# Patient Record
Sex: Female | Born: 1940
Health system: Southern US, Community
[De-identification: ages and names within clinical notes are randomized; demographics above are authoritative.]

## PROBLEM LIST (undated history)

## (undated) DIAGNOSIS — E119 Type 2 diabetes mellitus without complications: Secondary | ICD-10-CM

## (undated) DIAGNOSIS — E079 Disorder of thyroid, unspecified: Secondary | ICD-10-CM

## (undated) DIAGNOSIS — C4491 Basal cell carcinoma of skin, unspecified: Secondary | ICD-10-CM

## (undated) DIAGNOSIS — I4891 Unspecified atrial fibrillation: Secondary | ICD-10-CM

## (undated) DIAGNOSIS — R413 Other amnesia: Secondary | ICD-10-CM

## (undated) DIAGNOSIS — H409 Unspecified glaucoma: Secondary | ICD-10-CM

## (undated) DIAGNOSIS — I10 Essential (primary) hypertension: Secondary | ICD-10-CM

## (undated) DIAGNOSIS — Z8673 Personal history of transient ischemic attack (TIA), and cerebral infarction without residual deficits: Secondary | ICD-10-CM

## (undated) DIAGNOSIS — K52839 Microscopic colitis, unspecified: Secondary | ICD-10-CM

## (undated) DIAGNOSIS — H4040X0 Glaucoma secondary to eye inflammation, unspecified eye, stage unspecified: Secondary | ICD-10-CM

## (undated) DIAGNOSIS — E039 Hypothyroidism, unspecified: Secondary | ICD-10-CM

## (undated) DIAGNOSIS — H209 Unspecified iridocyclitis: Secondary | ICD-10-CM

## (undated) HISTORY — DX: Other amnesia: R41.3

## (undated) HISTORY — PX: BREAST EXCISIONAL BIOPSY: SUR124

## (undated) HISTORY — DX: Essential (primary) hypertension: I10

## (undated) HISTORY — DX: Glaucoma secondary to eye inflammation, unspecified eye, stage unspecified: H40.40X0

## (undated) HISTORY — DX: Unspecified atrial fibrillation: I48.91

## (undated) HISTORY — DX: Type 2 diabetes mellitus without complications: E11.9

## (undated) HISTORY — DX: Basal cell carcinoma of skin, unspecified: C44.91

## (undated) HISTORY — DX: Microscopic colitis, unspecified: K52.839

## (undated) HISTORY — DX: Personal history of transient ischemic attack (TIA), and cerebral infarction without residual deficits: Z86.73

## (undated) HISTORY — DX: Disorder of thyroid, unspecified: E07.9

## (undated) HISTORY — DX: Unspecified iridocyclitis: H20.9

## (undated) HISTORY — DX: Hypothyroidism, unspecified: E03.9

---

## 2011-02-23 DIAGNOSIS — H2510 Age-related nuclear cataract, unspecified eye: Secondary | ICD-10-CM | POA: Diagnosis not present

## 2011-02-24 DIAGNOSIS — H409 Unspecified glaucoma: Secondary | ICD-10-CM | POA: Diagnosis not present

## 2011-02-24 DIAGNOSIS — H4040X0 Glaucoma secondary to eye inflammation, unspecified eye, stage unspecified: Secondary | ICD-10-CM | POA: Diagnosis not present

## 2011-03-06 DIAGNOSIS — K5732 Diverticulitis of large intestine without perforation or abscess without bleeding: Secondary | ICD-10-CM | POA: Diagnosis not present

## 2011-03-06 DIAGNOSIS — E7800 Pure hypercholesterolemia, unspecified: Secondary | ICD-10-CM | POA: Diagnosis not present

## 2011-03-06 DIAGNOSIS — IMO0001 Reserved for inherently not codable concepts without codable children: Secondary | ICD-10-CM | POA: Diagnosis not present

## 2011-03-06 DIAGNOSIS — M949 Disorder of cartilage, unspecified: Secondary | ICD-10-CM | POA: Diagnosis not present

## 2011-03-06 DIAGNOSIS — M899 Disorder of bone, unspecified: Secondary | ICD-10-CM | POA: Diagnosis not present

## 2011-03-15 DIAGNOSIS — I10 Essential (primary) hypertension: Secondary | ICD-10-CM | POA: Diagnosis not present

## 2011-03-15 DIAGNOSIS — K5730 Diverticulosis of large intestine without perforation or abscess without bleeding: Secondary | ICD-10-CM | POA: Diagnosis not present

## 2011-03-15 DIAGNOSIS — E079 Disorder of thyroid, unspecified: Secondary | ICD-10-CM | POA: Diagnosis not present

## 2011-03-15 DIAGNOSIS — R110 Nausea: Secondary | ICD-10-CM | POA: Diagnosis not present

## 2011-03-15 DIAGNOSIS — R10815 Periumbilic abdominal tenderness: Secondary | ICD-10-CM | POA: Diagnosis not present

## 2011-03-15 DIAGNOSIS — R109 Unspecified abdominal pain: Secondary | ICD-10-CM | POA: Diagnosis not present

## 2011-03-15 DIAGNOSIS — I77811 Abdominal aortic ectasia: Secondary | ICD-10-CM | POA: Diagnosis not present

## 2011-03-15 DIAGNOSIS — R112 Nausea with vomiting, unspecified: Secondary | ICD-10-CM | POA: Diagnosis not present

## 2011-03-15 DIAGNOSIS — I714 Abdominal aortic aneurysm, without rupture: Secondary | ICD-10-CM | POA: Diagnosis not present

## 2011-03-15 DIAGNOSIS — E119 Type 2 diabetes mellitus without complications: Secondary | ICD-10-CM | POA: Diagnosis not present

## 2011-03-15 DIAGNOSIS — Z9889 Other specified postprocedural states: Secondary | ICD-10-CM | POA: Diagnosis not present

## 2011-03-21 DIAGNOSIS — R1310 Dysphagia, unspecified: Secondary | ICD-10-CM | POA: Diagnosis not present

## 2011-03-21 DIAGNOSIS — R0789 Other chest pain: Secondary | ICD-10-CM | POA: Diagnosis not present

## 2011-03-21 DIAGNOSIS — R109 Unspecified abdominal pain: Secondary | ICD-10-CM | POA: Diagnosis not present

## 2011-03-30 DIAGNOSIS — K219 Gastro-esophageal reflux disease without esophagitis: Secondary | ICD-10-CM | POA: Diagnosis not present

## 2011-03-30 DIAGNOSIS — R1310 Dysphagia, unspecified: Secondary | ICD-10-CM | POA: Diagnosis not present

## 2011-04-06 DIAGNOSIS — Z5309 Procedure and treatment not carried out because of other contraindication: Secondary | ICD-10-CM | POA: Diagnosis not present

## 2011-04-06 DIAGNOSIS — H2510 Age-related nuclear cataract, unspecified eye: Secondary | ICD-10-CM | POA: Diagnosis not present

## 2011-04-07 DIAGNOSIS — IMO0001 Reserved for inherently not codable concepts without codable children: Secondary | ICD-10-CM | POA: Diagnosis not present

## 2011-04-07 DIAGNOSIS — I953 Hypotension of hemodialysis: Secondary | ICD-10-CM | POA: Diagnosis not present

## 2011-04-07 DIAGNOSIS — E7800 Pure hypercholesterolemia, unspecified: Secondary | ICD-10-CM | POA: Diagnosis not present

## 2011-04-14 DIAGNOSIS — IMO0001 Reserved for inherently not codable concepts without codable children: Secondary | ICD-10-CM | POA: Diagnosis not present

## 2011-04-14 DIAGNOSIS — E7800 Pure hypercholesterolemia, unspecified: Secondary | ICD-10-CM | POA: Diagnosis not present

## 2011-04-14 DIAGNOSIS — L219 Seborrheic dermatitis, unspecified: Secondary | ICD-10-CM | POA: Diagnosis not present

## 2011-04-20 DIAGNOSIS — I10 Essential (primary) hypertension: Secondary | ICD-10-CM | POA: Diagnosis not present

## 2011-04-20 DIAGNOSIS — H4020X0 Unspecified primary angle-closure glaucoma, stage unspecified: Secondary | ICD-10-CM | POA: Diagnosis not present

## 2011-04-20 DIAGNOSIS — H4040X0 Glaucoma secondary to eye inflammation, unspecified eye, stage unspecified: Secondary | ICD-10-CM | POA: Diagnosis not present

## 2011-04-20 DIAGNOSIS — Z86718 Personal history of other venous thrombosis and embolism: Secondary | ICD-10-CM | POA: Diagnosis not present

## 2011-04-20 DIAGNOSIS — F17200 Nicotine dependence, unspecified, uncomplicated: Secondary | ICD-10-CM | POA: Diagnosis not present

## 2011-04-20 DIAGNOSIS — E7800 Pure hypercholesterolemia, unspecified: Secondary | ICD-10-CM | POA: Diagnosis not present

## 2011-04-20 DIAGNOSIS — E119 Type 2 diabetes mellitus without complications: Secondary | ICD-10-CM | POA: Diagnosis not present

## 2011-04-20 DIAGNOSIS — H409 Unspecified glaucoma: Secondary | ICD-10-CM | POA: Diagnosis not present

## 2011-04-20 DIAGNOSIS — H21549 Posterior synechiae (iris), unspecified eye: Secondary | ICD-10-CM | POA: Diagnosis not present

## 2011-04-20 DIAGNOSIS — H2510 Age-related nuclear cataract, unspecified eye: Secondary | ICD-10-CM | POA: Diagnosis not present

## 2011-04-20 DIAGNOSIS — M19049 Primary osteoarthritis, unspecified hand: Secondary | ICD-10-CM | POA: Diagnosis not present

## 2011-04-28 DIAGNOSIS — I10 Essential (primary) hypertension: Secondary | ICD-10-CM | POA: Diagnosis not present

## 2011-04-28 DIAGNOSIS — E7800 Pure hypercholesterolemia, unspecified: Secondary | ICD-10-CM | POA: Diagnosis not present

## 2011-04-28 DIAGNOSIS — IMO0001 Reserved for inherently not codable concepts without codable children: Secondary | ICD-10-CM | POA: Diagnosis not present

## 2011-05-09 DIAGNOSIS — H01009 Unspecified blepharitis unspecified eye, unspecified eyelid: Secondary | ICD-10-CM | POA: Diagnosis not present

## 2011-05-09 DIAGNOSIS — H00029 Hordeolum internum unspecified eye, unspecified eyelid: Secondary | ICD-10-CM | POA: Diagnosis not present

## 2011-05-09 DIAGNOSIS — H16229 Keratoconjunctivitis sicca, not specified as Sjogren's, unspecified eye: Secondary | ICD-10-CM | POA: Diagnosis not present

## 2011-05-24 DIAGNOSIS — IMO0001 Reserved for inherently not codable concepts without codable children: Secondary | ICD-10-CM | POA: Diagnosis not present

## 2011-05-24 DIAGNOSIS — E7800 Pure hypercholesterolemia, unspecified: Secondary | ICD-10-CM | POA: Diagnosis not present

## 2011-05-24 DIAGNOSIS — I10 Essential (primary) hypertension: Secondary | ICD-10-CM | POA: Diagnosis not present

## 2011-05-31 DIAGNOSIS — IMO0001 Reserved for inherently not codable concepts without codable children: Secondary | ICD-10-CM | POA: Diagnosis not present

## 2011-05-31 DIAGNOSIS — L219 Seborrheic dermatitis, unspecified: Secondary | ICD-10-CM | POA: Diagnosis not present

## 2011-05-31 DIAGNOSIS — H01009 Unspecified blepharitis unspecified eye, unspecified eyelid: Secondary | ICD-10-CM | POA: Diagnosis not present

## 2011-05-31 DIAGNOSIS — H16229 Keratoconjunctivitis sicca, not specified as Sjogren's, unspecified eye: Secondary | ICD-10-CM | POA: Diagnosis not present

## 2011-05-31 DIAGNOSIS — H00029 Hordeolum internum unspecified eye, unspecified eyelid: Secondary | ICD-10-CM | POA: Diagnosis not present

## 2011-07-04 DIAGNOSIS — IMO0001 Reserved for inherently not codable concepts without codable children: Secondary | ICD-10-CM | POA: Diagnosis not present

## 2011-07-04 DIAGNOSIS — E7800 Pure hypercholesterolemia, unspecified: Secondary | ICD-10-CM | POA: Diagnosis not present

## 2011-07-04 DIAGNOSIS — I10 Essential (primary) hypertension: Secondary | ICD-10-CM | POA: Diagnosis not present

## 2011-07-26 DIAGNOSIS — H209 Unspecified iridocyclitis: Secondary | ICD-10-CM | POA: Diagnosis not present

## 2011-07-26 DIAGNOSIS — H4040X0 Glaucoma secondary to eye inflammation, unspecified eye, stage unspecified: Secondary | ICD-10-CM | POA: Diagnosis not present

## 2011-08-25 DIAGNOSIS — H4040X0 Glaucoma secondary to eye inflammation, unspecified eye, stage unspecified: Secondary | ICD-10-CM | POA: Diagnosis not present

## 2011-08-25 DIAGNOSIS — H409 Unspecified glaucoma: Secondary | ICD-10-CM | POA: Diagnosis not present

## 2011-08-25 DIAGNOSIS — H04219 Epiphora due to excess lacrimation, unspecified lacrimal gland: Secondary | ICD-10-CM | POA: Diagnosis not present

## 2011-09-05 DIAGNOSIS — H01009 Unspecified blepharitis unspecified eye, unspecified eyelid: Secondary | ICD-10-CM | POA: Diagnosis not present

## 2011-10-02 DIAGNOSIS — B351 Tinea unguium: Secondary | ICD-10-CM | POA: Diagnosis not present

## 2011-10-02 DIAGNOSIS — IMO0001 Reserved for inherently not codable concepts without codable children: Secondary | ICD-10-CM | POA: Diagnosis not present

## 2011-10-02 DIAGNOSIS — E7800 Pure hypercholesterolemia, unspecified: Secondary | ICD-10-CM | POA: Diagnosis not present

## 2011-10-02 DIAGNOSIS — I10 Essential (primary) hypertension: Secondary | ICD-10-CM | POA: Diagnosis not present

## 2011-10-12 DIAGNOSIS — H40159 Residual stage of open-angle glaucoma, unspecified eye: Secondary | ICD-10-CM | POA: Diagnosis not present

## 2011-10-23 DIAGNOSIS — IMO0001 Reserved for inherently not codable concepts without codable children: Secondary | ICD-10-CM | POA: Diagnosis not present

## 2011-10-23 DIAGNOSIS — R509 Fever, unspecified: Secondary | ICD-10-CM | POA: Diagnosis not present

## 2011-10-23 DIAGNOSIS — I10 Essential (primary) hypertension: Secondary | ICD-10-CM | POA: Diagnosis not present

## 2011-10-30 DIAGNOSIS — IMO0001 Reserved for inherently not codable concepts without codable children: Secondary | ICD-10-CM | POA: Diagnosis not present

## 2011-10-30 DIAGNOSIS — I10 Essential (primary) hypertension: Secondary | ICD-10-CM | POA: Diagnosis not present

## 2011-11-15 DIAGNOSIS — H4040X0 Glaucoma secondary to eye inflammation, unspecified eye, stage unspecified: Secondary | ICD-10-CM | POA: Diagnosis not present

## 2011-11-21 DIAGNOSIS — E069 Thyroiditis, unspecified: Secondary | ICD-10-CM | POA: Diagnosis not present

## 2011-11-22 DIAGNOSIS — E069 Thyroiditis, unspecified: Secondary | ICD-10-CM | POA: Diagnosis not present

## 2011-11-27 DIAGNOSIS — Z23 Encounter for immunization: Secondary | ICD-10-CM | POA: Diagnosis not present

## 2011-11-27 DIAGNOSIS — I10 Essential (primary) hypertension: Secondary | ICD-10-CM | POA: Diagnosis not present

## 2011-11-27 DIAGNOSIS — H40009 Preglaucoma, unspecified, unspecified eye: Secondary | ICD-10-CM | POA: Diagnosis not present

## 2011-11-27 DIAGNOSIS — IMO0001 Reserved for inherently not codable concepts without codable children: Secondary | ICD-10-CM | POA: Diagnosis not present

## 2011-11-27 DIAGNOSIS — E7800 Pure hypercholesterolemia, unspecified: Secondary | ICD-10-CM | POA: Diagnosis not present

## 2011-12-26 DIAGNOSIS — H4040X0 Glaucoma secondary to eye inflammation, unspecified eye, stage unspecified: Secondary | ICD-10-CM | POA: Diagnosis not present

## 2011-12-26 DIAGNOSIS — H02409 Unspecified ptosis of unspecified eyelid: Secondary | ICD-10-CM | POA: Diagnosis not present

## 2011-12-26 DIAGNOSIS — H409 Unspecified glaucoma: Secondary | ICD-10-CM | POA: Diagnosis not present

## 2012-01-10 DIAGNOSIS — E039 Hypothyroidism, unspecified: Secondary | ICD-10-CM | POA: Diagnosis not present

## 2012-01-15 DIAGNOSIS — H4040X0 Glaucoma secondary to eye inflammation, unspecified eye, stage unspecified: Secondary | ICD-10-CM | POA: Diagnosis not present

## 2012-01-15 DIAGNOSIS — E119 Type 2 diabetes mellitus without complications: Secondary | ICD-10-CM | POA: Diagnosis not present

## 2012-01-15 DIAGNOSIS — H35359 Cystoid macular degeneration, unspecified eye: Secondary | ICD-10-CM | POA: Diagnosis not present

## 2012-01-15 DIAGNOSIS — H3020 Posterior cyclitis, unspecified eye: Secondary | ICD-10-CM | POA: Diagnosis not present

## 2012-01-15 DIAGNOSIS — H409 Unspecified glaucoma: Secondary | ICD-10-CM | POA: Diagnosis not present

## 2012-01-15 DIAGNOSIS — H2010 Chronic iridocyclitis, unspecified eye: Secondary | ICD-10-CM | POA: Diagnosis not present

## 2012-01-22 DIAGNOSIS — I10 Essential (primary) hypertension: Secondary | ICD-10-CM | POA: Diagnosis not present

## 2012-01-22 DIAGNOSIS — E039 Hypothyroidism, unspecified: Secondary | ICD-10-CM | POA: Diagnosis not present

## 2012-01-22 DIAGNOSIS — IMO0001 Reserved for inherently not codable concepts without codable children: Secondary | ICD-10-CM | POA: Diagnosis not present

## 2012-01-29 DIAGNOSIS — H35359 Cystoid macular degeneration, unspecified eye: Secondary | ICD-10-CM | POA: Diagnosis not present

## 2012-01-29 DIAGNOSIS — H409 Unspecified glaucoma: Secondary | ICD-10-CM | POA: Diagnosis not present

## 2012-01-29 DIAGNOSIS — E119 Type 2 diabetes mellitus without complications: Secondary | ICD-10-CM | POA: Diagnosis not present

## 2012-01-29 DIAGNOSIS — H4040X0 Glaucoma secondary to eye inflammation, unspecified eye, stage unspecified: Secondary | ICD-10-CM | POA: Diagnosis not present

## 2012-01-29 DIAGNOSIS — H2010 Chronic iridocyclitis, unspecified eye: Secondary | ICD-10-CM | POA: Diagnosis not present

## 2012-01-29 DIAGNOSIS — H44119 Panuveitis, unspecified eye: Secondary | ICD-10-CM | POA: Diagnosis not present

## 2012-01-29 DIAGNOSIS — H3020 Posterior cyclitis, unspecified eye: Secondary | ICD-10-CM | POA: Diagnosis not present

## 2012-03-15 DIAGNOSIS — H409 Unspecified glaucoma: Secondary | ICD-10-CM | POA: Diagnosis not present

## 2012-03-18 DIAGNOSIS — H2000 Unspecified acute and subacute iridocyclitis: Secondary | ICD-10-CM | POA: Diagnosis not present

## 2012-03-18 DIAGNOSIS — E039 Hypothyroidism, unspecified: Secondary | ICD-10-CM | POA: Diagnosis not present

## 2012-03-18 DIAGNOSIS — H409 Unspecified glaucoma: Secondary | ICD-10-CM | POA: Diagnosis not present

## 2012-03-18 DIAGNOSIS — H35359 Cystoid macular degeneration, unspecified eye: Secondary | ICD-10-CM | POA: Diagnosis not present

## 2012-03-18 DIAGNOSIS — H4040X0 Glaucoma secondary to eye inflammation, unspecified eye, stage unspecified: Secondary | ICD-10-CM | POA: Diagnosis not present

## 2012-03-18 DIAGNOSIS — E119 Type 2 diabetes mellitus without complications: Secondary | ICD-10-CM | POA: Diagnosis not present

## 2012-03-18 DIAGNOSIS — F17200 Nicotine dependence, unspecified, uncomplicated: Secondary | ICD-10-CM | POA: Diagnosis not present

## 2012-03-25 DIAGNOSIS — E039 Hypothyroidism, unspecified: Secondary | ICD-10-CM | POA: Diagnosis not present

## 2012-03-25 DIAGNOSIS — IMO0001 Reserved for inherently not codable concepts without codable children: Secondary | ICD-10-CM | POA: Diagnosis not present

## 2012-03-25 DIAGNOSIS — I10 Essential (primary) hypertension: Secondary | ICD-10-CM | POA: Diagnosis not present

## 2012-04-10 DIAGNOSIS — H4040X0 Glaucoma secondary to eye inflammation, unspecified eye, stage unspecified: Secondary | ICD-10-CM | POA: Diagnosis not present

## 2012-04-10 DIAGNOSIS — H409 Unspecified glaucoma: Secondary | ICD-10-CM | POA: Diagnosis not present

## 2012-04-22 DIAGNOSIS — H409 Unspecified glaucoma: Secondary | ICD-10-CM | POA: Diagnosis not present

## 2012-04-22 DIAGNOSIS — H209 Unspecified iridocyclitis: Secondary | ICD-10-CM | POA: Diagnosis not present

## 2012-04-22 DIAGNOSIS — H4040X0 Glaucoma secondary to eye inflammation, unspecified eye, stage unspecified: Secondary | ICD-10-CM | POA: Diagnosis not present

## 2012-05-06 DIAGNOSIS — L219 Seborrheic dermatitis, unspecified: Secondary | ICD-10-CM | POA: Diagnosis not present

## 2012-05-06 DIAGNOSIS — IMO0001 Reserved for inherently not codable concepts without codable children: Secondary | ICD-10-CM | POA: Diagnosis not present

## 2012-05-06 DIAGNOSIS — E039 Hypothyroidism, unspecified: Secondary | ICD-10-CM | POA: Diagnosis not present

## 2012-05-08 DIAGNOSIS — L259 Unspecified contact dermatitis, unspecified cause: Secondary | ICD-10-CM | POA: Diagnosis not present

## 2012-06-17 DIAGNOSIS — H3020 Posterior cyclitis, unspecified eye: Secondary | ICD-10-CM | POA: Diagnosis not present

## 2012-06-17 DIAGNOSIS — I10 Essential (primary) hypertension: Secondary | ICD-10-CM | POA: Diagnosis not present

## 2012-06-17 DIAGNOSIS — H3550 Unspecified hereditary retinal dystrophy: Secondary | ICD-10-CM | POA: Diagnosis not present

## 2012-06-17 DIAGNOSIS — H35179 Retrolental fibroplasia, unspecified eye: Secondary | ICD-10-CM | POA: Diagnosis not present

## 2012-06-17 DIAGNOSIS — H35359 Cystoid macular degeneration, unspecified eye: Secondary | ICD-10-CM | POA: Diagnosis not present

## 2012-06-17 DIAGNOSIS — IMO0001 Reserved for inherently not codable concepts without codable children: Secondary | ICD-10-CM | POA: Diagnosis not present

## 2012-06-17 DIAGNOSIS — E119 Type 2 diabetes mellitus without complications: Secondary | ICD-10-CM | POA: Diagnosis not present

## 2012-06-17 DIAGNOSIS — H4040X0 Glaucoma secondary to eye inflammation, unspecified eye, stage unspecified: Secondary | ICD-10-CM | POA: Diagnosis not present

## 2012-06-17 DIAGNOSIS — H44119 Panuveitis, unspecified eye: Secondary | ICD-10-CM | POA: Diagnosis not present

## 2012-06-17 DIAGNOSIS — E039 Hypothyroidism, unspecified: Secondary | ICD-10-CM | POA: Diagnosis not present

## 2012-06-17 DIAGNOSIS — H409 Unspecified glaucoma: Secondary | ICD-10-CM | POA: Diagnosis not present

## 2012-07-01 DIAGNOSIS — IMO0001 Reserved for inherently not codable concepts without codable children: Secondary | ICD-10-CM | POA: Diagnosis not present

## 2012-07-01 DIAGNOSIS — E039 Hypothyroidism, unspecified: Secondary | ICD-10-CM | POA: Diagnosis not present

## 2012-07-23 DIAGNOSIS — H26499 Other secondary cataract, unspecified eye: Secondary | ICD-10-CM | POA: Diagnosis not present

## 2012-07-23 DIAGNOSIS — H209 Unspecified iridocyclitis: Secondary | ICD-10-CM | POA: Diagnosis not present

## 2012-07-23 DIAGNOSIS — H409 Unspecified glaucoma: Secondary | ICD-10-CM | POA: Diagnosis not present

## 2012-07-23 DIAGNOSIS — H4040X0 Glaucoma secondary to eye inflammation, unspecified eye, stage unspecified: Secondary | ICD-10-CM | POA: Diagnosis not present

## 2012-08-26 DIAGNOSIS — H4040X0 Glaucoma secondary to eye inflammation, unspecified eye, stage unspecified: Secondary | ICD-10-CM | POA: Diagnosis not present

## 2012-08-26 DIAGNOSIS — H409 Unspecified glaucoma: Secondary | ICD-10-CM | POA: Diagnosis not present

## 2012-08-26 DIAGNOSIS — E119 Type 2 diabetes mellitus without complications: Secondary | ICD-10-CM | POA: Diagnosis not present

## 2012-08-26 DIAGNOSIS — H2010 Chronic iridocyclitis, unspecified eye: Secondary | ICD-10-CM | POA: Diagnosis not present

## 2012-08-26 DIAGNOSIS — H44119 Panuveitis, unspecified eye: Secondary | ICD-10-CM | POA: Diagnosis not present

## 2012-08-26 DIAGNOSIS — H35359 Cystoid macular degeneration, unspecified eye: Secondary | ICD-10-CM | POA: Diagnosis not present

## 2012-08-26 DIAGNOSIS — H3020 Posterior cyclitis, unspecified eye: Secondary | ICD-10-CM | POA: Diagnosis not present

## 2012-09-03 DIAGNOSIS — H4040X0 Glaucoma secondary to eye inflammation, unspecified eye, stage unspecified: Secondary | ICD-10-CM | POA: Diagnosis not present

## 2012-09-03 DIAGNOSIS — H524 Presbyopia: Secondary | ICD-10-CM | POA: Diagnosis not present

## 2012-09-03 DIAGNOSIS — H01009 Unspecified blepharitis unspecified eye, unspecified eyelid: Secondary | ICD-10-CM | POA: Diagnosis not present

## 2012-09-10 DIAGNOSIS — E039 Hypothyroidism, unspecified: Secondary | ICD-10-CM | POA: Diagnosis not present

## 2012-09-10 DIAGNOSIS — IMO0001 Reserved for inherently not codable concepts without codable children: Secondary | ICD-10-CM | POA: Diagnosis not present

## 2012-10-15 ENCOUNTER — Other Ambulatory Visit: Payer: Self-pay | Admitting: "Endocrinology

## 2012-10-15 ENCOUNTER — Ambulatory Visit (INDEPENDENT_AMBULATORY_CARE_PROVIDER_SITE_OTHER): Payer: Medicare Other

## 2012-10-15 DIAGNOSIS — Z1231 Encounter for screening mammogram for malignant neoplasm of breast: Secondary | ICD-10-CM

## 2012-10-15 DIAGNOSIS — IMO0001 Reserved for inherently not codable concepts without codable children: Secondary | ICD-10-CM | POA: Diagnosis not present

## 2012-10-28 DIAGNOSIS — H3020 Posterior cyclitis, unspecified eye: Secondary | ICD-10-CM | POA: Diagnosis not present

## 2012-10-28 DIAGNOSIS — H2640 Unspecified secondary cataract: Secondary | ICD-10-CM | POA: Diagnosis not present

## 2012-10-28 DIAGNOSIS — H44119 Panuveitis, unspecified eye: Secondary | ICD-10-CM | POA: Diagnosis not present

## 2012-10-28 DIAGNOSIS — F17200 Nicotine dependence, unspecified, uncomplicated: Secondary | ICD-10-CM | POA: Diagnosis not present

## 2012-10-28 DIAGNOSIS — H35379 Puckering of macula, unspecified eye: Secondary | ICD-10-CM | POA: Diagnosis not present

## 2012-10-28 DIAGNOSIS — H409 Unspecified glaucoma: Secondary | ICD-10-CM | POA: Diagnosis not present

## 2012-10-28 DIAGNOSIS — H2000 Unspecified acute and subacute iridocyclitis: Secondary | ICD-10-CM | POA: Diagnosis not present

## 2012-10-28 DIAGNOSIS — H4040X0 Glaucoma secondary to eye inflammation, unspecified eye, stage unspecified: Secondary | ICD-10-CM | POA: Diagnosis not present

## 2012-10-28 DIAGNOSIS — E119 Type 2 diabetes mellitus without complications: Secondary | ICD-10-CM | POA: Diagnosis not present

## 2012-10-28 DIAGNOSIS — H35359 Cystoid macular degeneration, unspecified eye: Secondary | ICD-10-CM | POA: Diagnosis not present

## 2012-11-11 DIAGNOSIS — H2010 Chronic iridocyclitis, unspecified eye: Secondary | ICD-10-CM | POA: Diagnosis not present

## 2012-11-11 DIAGNOSIS — H35359 Cystoid macular degeneration, unspecified eye: Secondary | ICD-10-CM | POA: Diagnosis not present

## 2012-11-11 DIAGNOSIS — H44119 Panuveitis, unspecified eye: Secondary | ICD-10-CM | POA: Diagnosis not present

## 2012-11-12 DIAGNOSIS — E119 Type 2 diabetes mellitus without complications: Secondary | ICD-10-CM | POA: Diagnosis not present

## 2012-11-12 DIAGNOSIS — H04129 Dry eye syndrome of unspecified lacrimal gland: Secondary | ICD-10-CM | POA: Diagnosis not present

## 2012-11-12 DIAGNOSIS — H4040X0 Glaucoma secondary to eye inflammation, unspecified eye, stage unspecified: Secondary | ICD-10-CM | POA: Diagnosis not present

## 2012-11-12 DIAGNOSIS — H26499 Other secondary cataract, unspecified eye: Secondary | ICD-10-CM | POA: Diagnosis not present

## 2012-12-03 DIAGNOSIS — I10 Essential (primary) hypertension: Secondary | ICD-10-CM | POA: Diagnosis not present

## 2012-12-03 DIAGNOSIS — IMO0001 Reserved for inherently not codable concepts without codable children: Secondary | ICD-10-CM | POA: Diagnosis not present

## 2012-12-03 DIAGNOSIS — E039 Hypothyroidism, unspecified: Secondary | ICD-10-CM | POA: Diagnosis not present

## 2012-12-03 DIAGNOSIS — Z01419 Encounter for gynecological examination (general) (routine) without abnormal findings: Secondary | ICD-10-CM | POA: Diagnosis not present

## 2012-12-03 DIAGNOSIS — Z0000 Encounter for general adult medical examination without abnormal findings: Secondary | ICD-10-CM | POA: Diagnosis not present

## 2012-12-04 DIAGNOSIS — E7800 Pure hypercholesterolemia, unspecified: Secondary | ICD-10-CM | POA: Diagnosis not present

## 2012-12-04 DIAGNOSIS — IMO0001 Reserved for inherently not codable concepts without codable children: Secondary | ICD-10-CM | POA: Diagnosis not present

## 2012-12-04 DIAGNOSIS — E559 Vitamin D deficiency, unspecified: Secondary | ICD-10-CM | POA: Diagnosis not present

## 2012-12-04 DIAGNOSIS — E039 Hypothyroidism, unspecified: Secondary | ICD-10-CM | POA: Diagnosis not present

## 2012-12-04 DIAGNOSIS — Z0000 Encounter for general adult medical examination without abnormal findings: Secondary | ICD-10-CM | POA: Diagnosis not present

## 2012-12-05 DIAGNOSIS — Z0000 Encounter for general adult medical examination without abnormal findings: Secondary | ICD-10-CM | POA: Diagnosis not present

## 2012-12-09 DIAGNOSIS — M810 Age-related osteoporosis without current pathological fracture: Secondary | ICD-10-CM | POA: Diagnosis not present

## 2012-12-09 DIAGNOSIS — M899 Disorder of bone, unspecified: Secondary | ICD-10-CM | POA: Diagnosis not present

## 2012-12-25 DIAGNOSIS — E119 Type 2 diabetes mellitus without complications: Secondary | ICD-10-CM | POA: Diagnosis not present

## 2012-12-25 DIAGNOSIS — H4040X0 Glaucoma secondary to eye inflammation, unspecified eye, stage unspecified: Secondary | ICD-10-CM | POA: Diagnosis not present

## 2012-12-25 DIAGNOSIS — H26499 Other secondary cataract, unspecified eye: Secondary | ICD-10-CM | POA: Diagnosis not present

## 2012-12-25 DIAGNOSIS — H409 Unspecified glaucoma: Secondary | ICD-10-CM | POA: Diagnosis not present

## 2012-12-25 DIAGNOSIS — H04129 Dry eye syndrome of unspecified lacrimal gland: Secondary | ICD-10-CM | POA: Diagnosis not present

## 2012-12-28 DIAGNOSIS — E079 Disorder of thyroid, unspecified: Secondary | ICD-10-CM | POA: Diagnosis not present

## 2012-12-28 DIAGNOSIS — I6350 Cerebral infarction due to unspecified occlusion or stenosis of unspecified cerebral artery: Secondary | ICD-10-CM | POA: Diagnosis not present

## 2012-12-28 DIAGNOSIS — I079 Rheumatic tricuspid valve disease, unspecified: Secondary | ICD-10-CM | POA: Diagnosis not present

## 2012-12-28 DIAGNOSIS — R51 Headache: Secondary | ICD-10-CM | POA: Diagnosis not present

## 2012-12-28 DIAGNOSIS — R42 Dizziness and giddiness: Secondary | ICD-10-CM | POA: Diagnosis not present

## 2012-12-28 DIAGNOSIS — R269 Unspecified abnormalities of gait and mobility: Secondary | ICD-10-CM | POA: Diagnosis present

## 2012-12-28 DIAGNOSIS — B353 Tinea pedis: Secondary | ICD-10-CM | POA: Diagnosis not present

## 2012-12-28 DIAGNOSIS — H409 Unspecified glaucoma: Secondary | ICD-10-CM | POA: Diagnosis present

## 2012-12-28 DIAGNOSIS — I69993 Ataxia following unspecified cerebrovascular disease: Secondary | ICD-10-CM | POA: Diagnosis not present

## 2012-12-28 DIAGNOSIS — I6789 Other cerebrovascular disease: Secondary | ICD-10-CM | POA: Diagnosis not present

## 2012-12-28 DIAGNOSIS — E119 Type 2 diabetes mellitus without complications: Secondary | ICD-10-CM | POA: Diagnosis not present

## 2012-12-28 DIAGNOSIS — R279 Unspecified lack of coordination: Secondary | ICD-10-CM | POA: Diagnosis present

## 2012-12-28 DIAGNOSIS — I639 Cerebral infarction, unspecified: Secondary | ICD-10-CM | POA: Insufficient documentation

## 2012-12-28 DIAGNOSIS — E785 Hyperlipidemia, unspecified: Secondary | ICD-10-CM | POA: Diagnosis not present

## 2012-12-28 DIAGNOSIS — Z794 Long term (current) use of insulin: Secondary | ICD-10-CM | POA: Diagnosis not present

## 2012-12-28 DIAGNOSIS — H44119 Panuveitis, unspecified eye: Secondary | ICD-10-CM | POA: Diagnosis not present

## 2012-12-28 DIAGNOSIS — R4701 Aphasia: Secondary | ICD-10-CM | POA: Diagnosis not present

## 2012-12-28 DIAGNOSIS — I10 Essential (primary) hypertension: Secondary | ICD-10-CM | POA: Diagnosis not present

## 2012-12-28 DIAGNOSIS — F17200 Nicotine dependence, unspecified, uncomplicated: Secondary | ICD-10-CM | POA: Diagnosis present

## 2012-12-30 DIAGNOSIS — H43391 Other vitreous opacities, right eye: Secondary | ICD-10-CM | POA: Insufficient documentation

## 2013-01-02 DIAGNOSIS — IMO0001 Reserved for inherently not codable concepts without codable children: Secondary | ICD-10-CM | POA: Diagnosis not present

## 2013-01-02 DIAGNOSIS — E039 Hypothyroidism, unspecified: Secondary | ICD-10-CM | POA: Diagnosis not present

## 2013-01-02 DIAGNOSIS — G459 Transient cerebral ischemic attack, unspecified: Secondary | ICD-10-CM | POA: Diagnosis not present

## 2013-01-13 DIAGNOSIS — E119 Type 2 diabetes mellitus without complications: Secondary | ICD-10-CM | POA: Diagnosis not present

## 2013-01-13 DIAGNOSIS — H35379 Puckering of macula, unspecified eye: Secondary | ICD-10-CM | POA: Diagnosis not present

## 2013-01-13 DIAGNOSIS — H4040X0 Glaucoma secondary to eye inflammation, unspecified eye, stage unspecified: Secondary | ICD-10-CM | POA: Diagnosis not present

## 2013-01-13 DIAGNOSIS — H44119 Panuveitis, unspecified eye: Secondary | ICD-10-CM | POA: Diagnosis not present

## 2013-01-13 DIAGNOSIS — H409 Unspecified glaucoma: Secondary | ICD-10-CM | POA: Diagnosis not present

## 2013-01-13 DIAGNOSIS — H35359 Cystoid macular degeneration, unspecified eye: Secondary | ICD-10-CM | POA: Diagnosis not present

## 2013-01-13 DIAGNOSIS — H3020 Posterior cyclitis, unspecified eye: Secondary | ICD-10-CM | POA: Diagnosis not present

## 2013-01-13 DIAGNOSIS — H2640 Unspecified secondary cataract: Secondary | ICD-10-CM | POA: Diagnosis not present

## 2013-02-19 DIAGNOSIS — H35359 Cystoid macular degeneration, unspecified eye: Secondary | ICD-10-CM | POA: Diagnosis not present

## 2013-02-19 DIAGNOSIS — E039 Hypothyroidism, unspecified: Secondary | ICD-10-CM | POA: Diagnosis not present

## 2013-02-19 DIAGNOSIS — E109 Type 1 diabetes mellitus without complications: Secondary | ICD-10-CM | POA: Diagnosis not present

## 2013-02-19 DIAGNOSIS — Z794 Long term (current) use of insulin: Secondary | ICD-10-CM | POA: Diagnosis not present

## 2013-02-19 DIAGNOSIS — E785 Hyperlipidemia, unspecified: Secondary | ICD-10-CM | POA: Diagnosis not present

## 2013-02-19 DIAGNOSIS — H209 Unspecified iridocyclitis: Secondary | ICD-10-CM | POA: Diagnosis not present

## 2013-02-19 DIAGNOSIS — I10 Essential (primary) hypertension: Secondary | ICD-10-CM | POA: Diagnosis not present

## 2013-02-19 DIAGNOSIS — H35379 Puckering of macula, unspecified eye: Secondary | ICD-10-CM | POA: Diagnosis not present

## 2013-02-20 DIAGNOSIS — E119 Type 2 diabetes mellitus without complications: Secondary | ICD-10-CM | POA: Diagnosis not present

## 2013-02-20 DIAGNOSIS — F17200 Nicotine dependence, unspecified, uncomplicated: Secondary | ICD-10-CM | POA: Diagnosis not present

## 2013-02-20 DIAGNOSIS — H35379 Puckering of macula, unspecified eye: Secondary | ICD-10-CM | POA: Diagnosis not present

## 2013-02-20 DIAGNOSIS — Z8673 Personal history of transient ischemic attack (TIA), and cerebral infarction without residual deficits: Secondary | ICD-10-CM | POA: Diagnosis not present

## 2013-02-20 DIAGNOSIS — H2640 Unspecified secondary cataract: Secondary | ICD-10-CM | POA: Diagnosis not present

## 2013-02-20 DIAGNOSIS — Z9889 Other specified postprocedural states: Secondary | ICD-10-CM | POA: Diagnosis not present

## 2013-02-20 DIAGNOSIS — H2000 Unspecified acute and subacute iridocyclitis: Secondary | ICD-10-CM | POA: Diagnosis not present

## 2013-02-20 DIAGNOSIS — H35359 Cystoid macular degeneration, unspecified eye: Secondary | ICD-10-CM | POA: Diagnosis not present

## 2013-03-04 DIAGNOSIS — G459 Transient cerebral ischemic attack, unspecified: Secondary | ICD-10-CM | POA: Diagnosis not present

## 2013-03-04 DIAGNOSIS — IMO0001 Reserved for inherently not codable concepts without codable children: Secondary | ICD-10-CM | POA: Diagnosis not present

## 2013-03-04 DIAGNOSIS — E7800 Pure hypercholesterolemia, unspecified: Secondary | ICD-10-CM | POA: Diagnosis not present

## 2013-03-04 DIAGNOSIS — E039 Hypothyroidism, unspecified: Secondary | ICD-10-CM | POA: Diagnosis not present

## 2013-03-10 DIAGNOSIS — H44119 Panuveitis, unspecified eye: Secondary | ICD-10-CM | POA: Diagnosis not present

## 2013-03-10 DIAGNOSIS — H3020 Posterior cyclitis, unspecified eye: Secondary | ICD-10-CM | POA: Diagnosis not present

## 2013-03-10 DIAGNOSIS — H35359 Cystoid macular degeneration, unspecified eye: Secondary | ICD-10-CM | POA: Diagnosis not present

## 2013-03-11 DIAGNOSIS — E119 Type 2 diabetes mellitus without complications: Secondary | ICD-10-CM | POA: Diagnosis not present

## 2013-03-11 DIAGNOSIS — H4040X0 Glaucoma secondary to eye inflammation, unspecified eye, stage unspecified: Secondary | ICD-10-CM | POA: Diagnosis not present

## 2013-03-11 DIAGNOSIS — H04129 Dry eye syndrome of unspecified lacrimal gland: Secondary | ICD-10-CM | POA: Diagnosis not present

## 2013-03-11 DIAGNOSIS — H209 Unspecified iridocyclitis: Secondary | ICD-10-CM | POA: Diagnosis not present

## 2013-04-24 DIAGNOSIS — Z87891 Personal history of nicotine dependence: Secondary | ICD-10-CM | POA: Diagnosis not present

## 2013-04-24 DIAGNOSIS — H409 Unspecified glaucoma: Secondary | ICD-10-CM | POA: Diagnosis not present

## 2013-04-24 DIAGNOSIS — H35359 Cystoid macular degeneration, unspecified eye: Secondary | ICD-10-CM | POA: Diagnosis not present

## 2013-04-24 DIAGNOSIS — H3020 Posterior cyclitis, unspecified eye: Secondary | ICD-10-CM | POA: Diagnosis not present

## 2013-04-24 DIAGNOSIS — H4040X0 Glaucoma secondary to eye inflammation, unspecified eye, stage unspecified: Secondary | ICD-10-CM | POA: Diagnosis not present

## 2013-04-24 DIAGNOSIS — H44119 Panuveitis, unspecified eye: Secondary | ICD-10-CM | POA: Diagnosis not present

## 2013-04-24 DIAGNOSIS — I10 Essential (primary) hypertension: Secondary | ICD-10-CM | POA: Diagnosis not present

## 2013-04-24 DIAGNOSIS — E039 Hypothyroidism, unspecified: Secondary | ICD-10-CM | POA: Diagnosis not present

## 2013-04-24 DIAGNOSIS — E119 Type 2 diabetes mellitus without complications: Secondary | ICD-10-CM | POA: Diagnosis not present

## 2013-04-24 DIAGNOSIS — Z794 Long term (current) use of insulin: Secondary | ICD-10-CM | POA: Diagnosis not present

## 2013-05-12 DIAGNOSIS — B360 Pityriasis versicolor: Secondary | ICD-10-CM | POA: Diagnosis not present

## 2013-05-12 DIAGNOSIS — E7800 Pure hypercholesterolemia, unspecified: Secondary | ICD-10-CM | POA: Diagnosis not present

## 2013-05-12 DIAGNOSIS — IMO0001 Reserved for inherently not codable concepts without codable children: Secondary | ICD-10-CM | POA: Diagnosis not present

## 2013-05-26 DIAGNOSIS — Z794 Long term (current) use of insulin: Secondary | ICD-10-CM | POA: Diagnosis not present

## 2013-05-26 DIAGNOSIS — H2010 Chronic iridocyclitis, unspecified eye: Secondary | ICD-10-CM | POA: Diagnosis not present

## 2013-05-26 DIAGNOSIS — H35379 Puckering of macula, unspecified eye: Secondary | ICD-10-CM | POA: Diagnosis not present

## 2013-05-26 DIAGNOSIS — E11311 Type 2 diabetes mellitus with unspecified diabetic retinopathy with macular edema: Secondary | ICD-10-CM | POA: Diagnosis not present

## 2013-05-26 DIAGNOSIS — E1139 Type 2 diabetes mellitus with other diabetic ophthalmic complication: Secondary | ICD-10-CM | POA: Diagnosis not present

## 2013-05-26 DIAGNOSIS — H4040X0 Glaucoma secondary to eye inflammation, unspecified eye, stage unspecified: Secondary | ICD-10-CM | POA: Diagnosis not present

## 2013-05-26 DIAGNOSIS — E119 Type 2 diabetes mellitus without complications: Secondary | ICD-10-CM | POA: Diagnosis not present

## 2013-05-26 DIAGNOSIS — Z8673 Personal history of transient ischemic attack (TIA), and cerebral infarction without residual deficits: Secondary | ICD-10-CM | POA: Diagnosis not present

## 2013-05-26 DIAGNOSIS — H2640 Unspecified secondary cataract: Secondary | ICD-10-CM | POA: Diagnosis not present

## 2013-05-26 DIAGNOSIS — H409 Unspecified glaucoma: Secondary | ICD-10-CM | POA: Diagnosis not present

## 2013-05-26 DIAGNOSIS — H35359 Cystoid macular degeneration, unspecified eye: Secondary | ICD-10-CM | POA: Diagnosis not present

## 2013-05-26 DIAGNOSIS — H2000 Unspecified acute and subacute iridocyclitis: Secondary | ICD-10-CM | POA: Diagnosis not present

## 2013-06-10 DIAGNOSIS — H409 Unspecified glaucoma: Secondary | ICD-10-CM | POA: Diagnosis not present

## 2013-06-10 DIAGNOSIS — H04129 Dry eye syndrome of unspecified lacrimal gland: Secondary | ICD-10-CM | POA: Diagnosis not present

## 2013-06-10 DIAGNOSIS — E119 Type 2 diabetes mellitus without complications: Secondary | ICD-10-CM | POA: Diagnosis not present

## 2013-06-10 DIAGNOSIS — H4040X0 Glaucoma secondary to eye inflammation, unspecified eye, stage unspecified: Secondary | ICD-10-CM | POA: Diagnosis not present

## 2013-06-10 DIAGNOSIS — H02409 Unspecified ptosis of unspecified eyelid: Secondary | ICD-10-CM | POA: Diagnosis not present

## 2013-08-11 ENCOUNTER — Ambulatory Visit (INDEPENDENT_AMBULATORY_CARE_PROVIDER_SITE_OTHER): Payer: Medicare Other | Admitting: Family Medicine

## 2013-08-11 ENCOUNTER — Encounter: Payer: Self-pay | Admitting: Family Medicine

## 2013-08-11 ENCOUNTER — Other Ambulatory Visit: Payer: Self-pay | Admitting: Family Medicine

## 2013-08-11 VITALS — BP 167/74 | HR 63 | Ht 65.0 in | Wt 151.0 lb

## 2013-08-11 DIAGNOSIS — IMO0002 Reserved for concepts with insufficient information to code with codable children: Secondary | ICD-10-CM | POA: Insufficient documentation

## 2013-08-11 DIAGNOSIS — E119 Type 2 diabetes mellitus without complications: Secondary | ICD-10-CM

## 2013-08-11 DIAGNOSIS — M25569 Pain in unspecified knee: Secondary | ICD-10-CM

## 2013-08-11 DIAGNOSIS — R739 Hyperglycemia, unspecified: Secondary | ICD-10-CM

## 2013-08-11 DIAGNOSIS — R7309 Other abnormal glucose: Secondary | ICD-10-CM | POA: Diagnosis not present

## 2013-08-11 DIAGNOSIS — Z8673 Personal history of transient ischemic attack (TIA), and cerebral infarction without residual deficits: Secondary | ICD-10-CM | POA: Insufficient documentation

## 2013-08-11 DIAGNOSIS — I152 Hypertension secondary to endocrine disorders: Secondary | ICD-10-CM | POA: Insufficient documentation

## 2013-08-11 DIAGNOSIS — M8580 Other specified disorders of bone density and structure, unspecified site: Secondary | ICD-10-CM | POA: Insufficient documentation

## 2013-08-11 DIAGNOSIS — E785 Hyperlipidemia, unspecified: Secondary | ICD-10-CM

## 2013-08-11 DIAGNOSIS — E039 Hypothyroidism, unspecified: Secondary | ICD-10-CM

## 2013-08-11 DIAGNOSIS — E1159 Type 2 diabetes mellitus with other circulatory complications: Secondary | ICD-10-CM | POA: Insufficient documentation

## 2013-08-11 DIAGNOSIS — I10 Essential (primary) hypertension: Secondary | ICD-10-CM

## 2013-08-11 DIAGNOSIS — E1139 Type 2 diabetes mellitus with other diabetic ophthalmic complication: Secondary | ICD-10-CM | POA: Insufficient documentation

## 2013-08-11 DIAGNOSIS — J029 Acute pharyngitis, unspecified: Secondary | ICD-10-CM

## 2013-08-11 DIAGNOSIS — E031 Congenital hypothyroidism without goiter: Secondary | ICD-10-CM

## 2013-08-11 DIAGNOSIS — E1165 Type 2 diabetes mellitus with hyperglycemia: Secondary | ICD-10-CM

## 2013-08-11 DIAGNOSIS — B353 Tinea pedis: Secondary | ICD-10-CM

## 2013-08-11 DIAGNOSIS — M25561 Pain in right knee: Secondary | ICD-10-CM | POA: Insufficient documentation

## 2013-08-11 HISTORY — DX: Hypothyroidism, unspecified: E03.9

## 2013-08-11 HISTORY — DX: Personal history of transient ischemic attack (TIA), and cerebral infarction without residual deficits: Z86.73

## 2013-08-11 LAB — HEMOGLOBIN A1C
Hgb A1c MFr Bld: 8.8 % — ABNORMAL HIGH (ref <5.7)
Mean Plasma Glucose: 206 mg/dL — ABNORMAL HIGH (ref <117)

## 2013-08-11 MED ORDER — MELOXICAM 15 MG PO TABS: 15.0000 mg | ORAL_TABLET | Freq: Every day | ORAL | Status: DC

## 2013-08-11 MED ORDER — CLOTRIMAZOLE 1 % EX CREA: TOPICAL_CREAM | CUTANEOUS | Status: AC

## 2013-08-11 NOTE — Progress Notes (Signed)
CC: Carmen Cooper is a 73 y.o. female is here for Establish Care   Subjective: HPI:  Very pleasant 73 year old here to establish care  History essential hypertension: Currently taking clonidine and AZOR.  No outside blood pressures to report. No known intolerances to the above medications.  It's not quite clear why she is on clonidine and not on a longer acting agent.  History of hyperlipidemia currently taking Lipitor a daily basis without right upper quadrant pain or myalgias. She has a history of a TIA in 2014 and since then has been taking a full dose aspirin daily. The TIA was described as a few hours of slurred speech and balance difficulty that resolved without intervention. Her outside records she has been evaluated by neurology since this episode  History of hypothyroidism currently taking 100 mcg of levothyroxine on a daily basis. Denies unintentional weight loss or gain nor recent missed doses  Reports a history of type 2 diabetes. She's been on metformin in the past however for reasons that are unclear to her her former physician stopped this when mealtime insulin was added to Lantus. She brings in blood sugars today with fasting blood sugars ranging between 180-200.  She admits that she frequently misses her evening dose of Lantus that she was under the impression that she had to take this at 11 PM and usually she is asleep at this time of day.  Complains of right knee pain that has been present for over a year now. Absent at rest however the longer she stands on it the more pain is noticeable. It is localized laterally and radiates slightly down the fibula is described only as a pain. It is accompanied by swelling and redness on the lateral aspect of the knee only anterior though, when pain is present. Improves with rest. No erections as of yet. She denies catching locking or giving way  Complains of sore throat that has been present for the last month on a daily basis mild to moderate  in severity. No interventions as of yet. Accompanied by postnasal drip subjectively. Denies facial pressure, hearing loss, cough, fevers or chills. no interventions as of yet  Complains of a rash on the right foot localized on the dorsal aspect that has been present to at least a mild degree for the past year. It is itchy and slightly painful. No benefit from ketoconazole or betamethasone. Denies skin lesions elsewhere  Review of Systems - General ROS: negative for - chills, fever, night sweats, weight gain or weight loss Ophthalmic ROS: negative for - decreased vision Psychological ROS: negative for - anxiety or depression ENT ROS: negative for - hearing change, nasal congestion, tinnitus or allergies Hematological and Lymphatic ROS: negative for - bleeding problems, bruising or swollen lymph nodes Breast ROS: negative Respiratory ROS: no cough, shortness of breath, or wheezing Cardiovascular ROS: no chest pain or dyspnea on exertion Gastrointestinal ROS: no abdominal pain, change in bowel habits, or black or bloody stools Genito-Urinary ROS: negative for - genital discharge, genital ulcers, incontinence or abnormal bleeding from genitals Musculoskeletal ROS: negative for - joint pain or muscle pain other than that described above Neurological ROS: negative for - headaches or memory loss Dermatological ROS: negative for lumps, mole changes, rash and skin lesion changes other than that described above  Past Medical History  Diagnosis Date  . Diabetes   . Hypertension   . Thyroid disease     No past surgical history on file. No family history on file.  History   Social History  . Marital Status: Married    Spouse Name: N/A    Number of Children: N/A  . Years of Education: N/A   Occupational History  . Not on file.   Social History Main Topics  . Smoking status: Current Every Day Smoker  . Smokeless tobacco: Not on file  . Alcohol Use: Yes     Comment: 2-3 a day  . Drug Use:  No  . Sexual Activity: Not Currently   Other Topics Concern  . Not on file   Social History Narrative  . No narrative on file     Objective: BP 167/74  Pulse 63  Ht 5\' 5"  (1.651 m)  Wt 151 lb (68.493 kg)  BMI 25.13 kg/m2  General: Alert and Oriented, No Acute Distress HEENT: Pupils equal, round, reactive to light. Conjunctivae clear.  External ears unremarkable, canals clear with intact TMs with appropriate landmarks.  Middle ear appears open without effusion. Pink inferior turbinates.  Moist mucous membranes, pharynx without inflammation nor lesions however moderate post nasal drip.  Neck supple without palpable lymphadenopathy nor abnormal masses. Lungs: Clear to auscultation bilaterally, no wheezing/ronchi/rales.  Comfortable work of breathing. Good air movement. Cardiac: Regular rate and rhythm. Normal S1/S2.  No murmurs, rubs, nor gallops.   Abdomen: Soft nontender Extremities: No peripheral edema.  Strong peripheral pulses. Right knee exam shows full-strength and range of motion. There is no swelling, redness, nor warmth overlying the knee.  No patellar crepitus. No patellar apprehension. No pain with palpation of the inferior patellar pole.  No pain or laxity with valgus nor varus stress. Anterior drawer is negative. McMurray's negative. No popliteal space tenderness or palpable mass. No medial or lateral joint line tenderness to palpation. Mental Status: No depression, anxiety, nor agitation. Skin: Warm and dry. Mild flaking and erythema 3 mm in diameter on the lateral dorsal aspect of the right foot  Assessment & Plan: Noe was seen today for establish care.  Diagnoses and associated orders for this visit:  Essential hypertension, benign - COMPLETE METABOLIC PANEL WITH GFR  Hyperlipidemia - Lipid panel  Congenital hypothyroidism without goiter - TSH  Type 2 diabetes mellitus without complication - COMPLETE METABOLIC PANEL WITH GFR - Hemoglobin A1c  Right knee  pain  Sore throat  Hyperglycemia - C-peptide  Tinea pedis of right foot - clotrimazole (LOTRIMIN) 1 % cream; Apply to affected areas twice a day for up to four weeks, applying up to two weeks after resolution of symptoms.  Other Orders - meloxicam (MOBIC) 15 MG tablet; Take 1 tablet (15 mg total) by mouth daily. To prevent knee pain.    Essential hypertension: Controlled continue current medications, looking into past medical records to see why she is on clonidine. Checking renal function to see if we can switch her to hydrochlorothiazide. Hyperlipidemia: Clinically controlled due for lipid panel with liver enzymes Type 2 diabetes: Uncontrolled, checking a C-peptide to confirm that she's type 2 diabetes. If C-peptide is present and going to stop her mealtime insulin and restart metformin. I encouraged her to take Lantus anytime of the day that fits well with her schedule as long as it's consistently at the same time every day Sore throat: Due to postnasal drip, which is reassuring she declines any intervention Right knee pain: Suspect osteoarthritis start meloxicam Tinea pedis of the right foot start clotrimazole Hypothyroidism: Clinically controlled due for TSH   Return in about 4 weeks (around 09/08/2013) for Blood Sugar Follow Up.

## 2013-08-12 ENCOUNTER — Ambulatory Visit: Payer: TRICARE For Life (TFL) | Admitting: Family Medicine

## 2013-08-12 LAB — COMPLETE METABOLIC PANEL WITH GFR
ALK PHOS: 63 U/L (ref 39–117)
ALT: 12 U/L (ref 0–35)
AST: 14 U/L (ref 0–37)
Albumin: 4.1 g/dL (ref 3.5–5.2)
BILIRUBIN TOTAL: 1.1 mg/dL (ref 0.2–1.2)
BUN: 20 mg/dL (ref 6–23)
CO2: 27 meq/L (ref 19–32)
CREATININE: 0.84 mg/dL (ref 0.50–1.10)
Calcium: 8.9 mg/dL (ref 8.4–10.5)
Chloride: 101 mEq/L (ref 96–112)
GFR, EST AFRICAN AMERICAN: 80 mL/min
GFR, EST NON AFRICAN AMERICAN: 69 mL/min
GLUCOSE: 348 mg/dL — AB (ref 70–99)
Potassium: 4.4 mEq/L (ref 3.5–5.3)
Sodium: 139 mEq/L (ref 135–145)
Total Protein: 6.4 g/dL (ref 6.0–8.3)

## 2013-08-12 LAB — LIPID PANEL
Cholesterol: 147 mg/dL (ref 0–200)
HDL: 82 mg/dL (ref >39)
LDL Cholesterol: 53 mg/dL (ref 0–99)
TRIGLYCERIDES: 59 mg/dL (ref <150)
Total CHOL/HDL Ratio: 1.8 Ratio
VLDL: 12 mg/dL (ref 0–40)

## 2013-08-12 LAB — TSH: TSH: 6.759 u[IU]/mL — ABNORMAL HIGH (ref 0.350–4.500)

## 2013-08-12 LAB — C-PEPTIDE

## 2013-08-13 ENCOUNTER — Telehealth: Payer: Self-pay | Admitting: Family Medicine

## 2013-08-13 DIAGNOSIS — E139 Other specified diabetes mellitus without complications: Secondary | ICD-10-CM

## 2013-08-13 LAB — T4, FREE: FREE T4: 1.45 ng/dL (ref 0.80–1.80)

## 2013-08-13 MED ORDER — METFORMIN HCL 1000 MG PO TABS: ORAL_TABLET | ORAL | Status: DC

## 2013-08-13 MED ORDER — LEVOTHYROXINE SODIUM 112 MCG PO TABS: 112.0000 ug | ORAL_TABLET | Freq: Every day | ORAL | Status: DC

## 2013-08-13 NOTE — Telephone Encounter (Signed)
Ok..so patient is also taking Humalog. She is on a sliding scale. She says she takes 4 units starting in the am.

## 2013-08-13 NOTE — Telephone Encounter (Signed)
Carmen Cooper, Will you please let patient know that I'd recommend she start a higher dose of her thyroid supplementation.  New rx of levothyroxine sent to her [facility] to replace the 165mcg formulation.  I'd recommend she restart metformin that I've also sent in.  F/U with me in 4 weeks for diabetic f/u.

## 2013-08-14 ENCOUNTER — Telehealth: Payer: Self-pay | Admitting: *Deleted

## 2013-08-14 DIAGNOSIS — H35359 Cystoid macular degeneration, unspecified eye: Secondary | ICD-10-CM | POA: Diagnosis not present

## 2013-08-14 DIAGNOSIS — I10 Essential (primary) hypertension: Secondary | ICD-10-CM

## 2013-08-14 DIAGNOSIS — Z794 Long term (current) use of insulin: Secondary | ICD-10-CM | POA: Diagnosis not present

## 2013-08-14 DIAGNOSIS — H3020 Posterior cyclitis, unspecified eye: Secondary | ICD-10-CM | POA: Diagnosis not present

## 2013-08-14 DIAGNOSIS — H35379 Puckering of macula, unspecified eye: Secondary | ICD-10-CM | POA: Diagnosis not present

## 2013-08-14 DIAGNOSIS — H409 Unspecified glaucoma: Secondary | ICD-10-CM | POA: Diagnosis not present

## 2013-08-14 DIAGNOSIS — E1139 Type 2 diabetes mellitus with other diabetic ophthalmic complication: Secondary | ICD-10-CM | POA: Diagnosis not present

## 2013-08-14 DIAGNOSIS — Z8673 Personal history of transient ischemic attack (TIA), and cerebral infarction without residual deficits: Secondary | ICD-10-CM | POA: Diagnosis not present

## 2013-08-14 DIAGNOSIS — E11311 Type 2 diabetes mellitus with unspecified diabetic retinopathy with macular edema: Secondary | ICD-10-CM | POA: Diagnosis not present

## 2013-08-14 DIAGNOSIS — H4040X0 Glaucoma secondary to eye inflammation, unspecified eye, stage unspecified: Secondary | ICD-10-CM | POA: Diagnosis not present

## 2013-08-14 DIAGNOSIS — Z9889 Other specified postprocedural states: Secondary | ICD-10-CM | POA: Diagnosis not present

## 2013-08-14 DIAGNOSIS — Z8669 Personal history of other diseases of the nervous system and sense organs: Secondary | ICD-10-CM | POA: Diagnosis not present

## 2013-08-14 DIAGNOSIS — H2010 Chronic iridocyclitis, unspecified eye: Secondary | ICD-10-CM | POA: Diagnosis not present

## 2013-08-14 DIAGNOSIS — E11319 Type 2 diabetes mellitus with unspecified diabetic retinopathy without macular edema: Secondary | ICD-10-CM | POA: Diagnosis not present

## 2013-08-14 DIAGNOSIS — E119 Type 2 diabetes mellitus without complications: Secondary | ICD-10-CM | POA: Diagnosis not present

## 2013-08-14 MED ORDER — INSULIN LISPRO 100 UNIT/ML (KWIKPEN): PEN_INJECTOR | SUBCUTANEOUS | Status: DC

## 2013-08-14 NOTE — Telephone Encounter (Signed)
Rx sent in for Humalog

## 2013-08-14 NOTE — Telephone Encounter (Signed)
Pt requsts another medication to take the place of Azor that would be cheaper. [facility]

## 2013-08-14 NOTE — Telephone Encounter (Signed)
Pt said never mind on the humalog,canceled rx

## 2013-08-14 NOTE — Telephone Encounter (Signed)
For now I'd recommend she continue on that.  Her insulin test came back showing that her body produces literally little to no insulin.  Since this is more like a Type I diabetic I'm going to refer her to endocrinology for more specialized management.  Call me if no contact by next week about scheduling this.

## 2013-08-14 NOTE — Telephone Encounter (Addendum)
Pt is going to need a rx for Humalog and she does a sliding scale starting out with 4 units in the am. Since we have not rx'd this before can you put in a rx for this?

## 2013-08-17 MED ORDER — AMLODIPINE BESY-BENAZEPRIL HCL 5-20 MG PO CAPS
1.0000 | ORAL_CAPSULE | Freq: Every day | ORAL | Status: DC
Start: 2013-08-17 — End: 2013-11-12

## 2013-08-17 NOTE — Telephone Encounter (Signed)
Carmen Cooper, Will you please let patient know that I've sent an rx of a new [facility] HTN pill [facility].  This replaces Azor, the new pill has the combo of amlodipine and benazepril.  We'll recheck BP at her FU at the end of the month.

## 2013-08-18 NOTE — Telephone Encounter (Signed)
Pt.notified

## 2013-08-21 ENCOUNTER — Encounter: Payer: Self-pay | Admitting: Family Medicine

## 2013-08-21 DIAGNOSIS — K52839 Microscopic colitis, unspecified: Secondary | ICD-10-CM | POA: Insufficient documentation

## 2013-08-21 DIAGNOSIS — Z9889 Other specified postprocedural states: Secondary | ICD-10-CM | POA: Insufficient documentation

## 2013-08-21 DIAGNOSIS — Z299 Encounter for prophylactic measures, unspecified: Secondary | ICD-10-CM | POA: Insufficient documentation

## 2013-08-21 HISTORY — DX: Microscopic colitis, unspecified: K52.839

## 2013-09-02 ENCOUNTER — Ambulatory Visit: Payer: Medicare Other | Admitting: Endocrinology

## 2013-09-03 ENCOUNTER — Ambulatory Visit: Payer: Medicare Other | Admitting: Endocrinology

## 2013-09-08 ENCOUNTER — Ambulatory Visit (INDEPENDENT_AMBULATORY_CARE_PROVIDER_SITE_OTHER): Payer: Medicare Other | Admitting: Family Medicine

## 2013-09-08 ENCOUNTER — Encounter: Payer: Self-pay | Admitting: Family Medicine

## 2013-09-08 VITALS — BP 155/71 | HR 68 | Wt 154.0 lb

## 2013-09-08 DIAGNOSIS — E031 Congenital hypothyroidism without goiter: Secondary | ICD-10-CM

## 2013-09-08 DIAGNOSIS — M949 Disorder of cartilage, unspecified: Secondary | ICD-10-CM | POA: Diagnosis not present

## 2013-09-08 DIAGNOSIS — M8580 Other specified disorders of bone density and structure, unspecified site: Secondary | ICD-10-CM

## 2013-09-08 DIAGNOSIS — M899 Disorder of bone, unspecified: Secondary | ICD-10-CM | POA: Diagnosis not present

## 2013-09-08 DIAGNOSIS — E119 Type 2 diabetes mellitus without complications: Secondary | ICD-10-CM | POA: Diagnosis not present

## 2013-09-08 MED ORDER — ALENDRONATE SODIUM 70 MG PO TABS: 70.0000 mg | ORAL_TABLET | ORAL | Status: DC

## 2013-09-08 MED ORDER — LEVOTHYROXINE SODIUM 112 MCG PO TABS: 112.0000 ug | ORAL_TABLET | Freq: Every day | ORAL | Status: DC

## 2013-09-08 NOTE — Progress Notes (Signed)
CC: Carmen Cooper is a 73 y.o. female is here for f/u blood sugar   Subjective: HPI:  Followup diabetes: She continues to take Humalog approximately 4-6 units with every meal. She continues to take Lantus 38 units when she can remember, she still under the impression that she only take this at 11 PM every evening. She often misses this dose due to not remembering to await 11 PM. Blood sugars have been ranging from 120-300 fasting and postprandial. Symptoms are worse when she eats ice cream before bed. 3 hypoglycemic episodes at 50 first thing in the morning reporting sweating and an upset stomach that is improved with drinking juice. She never started metformin.  Followup osteopenia: She tells me that in the past she has had discussions about taking bisphosphonates but never started.  Just less than a year ago she had a FRAX SCORE of 24% for major fracture. She tells me she's never had a fracture in the past.  Followup hypothyroidism: TSH was slightly elevated I saw her last she's been taking 100 mcg of levothyroxine a daily basis. Denies weight gain, weight loss, constipation or diarrhea. Denies skin or hair changes.   Review Of Systems Outlined In HPI  Past Medical History  Diagnosis Date  . Diabetes   . Hypertension   . Thyroid disease     No past surgical history on file. No family history on file.  History   Social History  . Marital Status: Married    Spouse Name: N/A    Number of Children: N/A  . Years of Education: N/A   Occupational History  . Not on file.   Social History Main Topics  . Smoking status: Current Every Day Smoker  . Smokeless tobacco: Not on file  . Alcohol Use: Yes     Comment: 2-3 a day  . Drug Use: No  . Sexual Activity: Not Currently   Other Topics Concern  . Not on file   Social History Narrative  . No narrative on file     Objective: BP 155/71  Pulse 68  Wt 154 lb (69.854 kg)  General: Alert and Oriented, No Acute Distress HEENT:  Pupils equal, round, reactive to light. Conjunctivae clear.  Moist mucous membranes pharynx unremarkable Lungs: Clear to auscultation bilaterally, no wheezing/ronchi/rales.  Comfortable work of breathing. Good air movement. Cardiac: Regular rate and rhythm. Normal S1/S2.  No murmurs, rubs, nor gallops.   Abdomen: Normal bowel sounds, soft and non tender without palpable masses. Extremities: No peripheral edema.  Strong peripheral pulses.  Mental Status: No depression, anxiety, nor agitation. Skin: Warm and dry.  Assessment & Plan: Carmen Cooper was seen today for f/u blood sugar.  Diagnoses and associated orders for this visit:  Congenital hypothyroidism without goiter  Osteopenia - alendronate (FOSAMAX) 70 MG tablet; Take 1 tablet (70 mg total) by mouth every 7 (seven) days. Take with a full glass of water on an empty stomach.  Type 2 diabetes mellitus without complication  Other Orders - levothyroxine (SYNTHROID, LEVOTHROID) 112 MCG tablet; Take 1 tablet (112 mcg total) by mouth daily. To replace 135mcg    Diabetes: I stressed to her today that she does not have to take her Lantus at 11 PM every evening instead she can take it at anytime of the day that she wants as long as she is consistent with taking a the same time of day on a daily basis.  Stopping metformin given absence of C-peptide, I would like her to establish with  endocrinology fortunately she has an appointment coming up next month. Continue Humalog with every meal Osteopenia: Starting Fosamax given her 10 year major fracture risk Hypothyroidism: Uncontrolled increasing levothyroxine  Return in about 2 months (around 11/09/2013) for HTN and Diabetic Follow Up.

## 2013-09-11 ENCOUNTER — Other Ambulatory Visit: Payer: Self-pay | Admitting: *Deleted

## 2013-09-11 DIAGNOSIS — M8580 Other specified disorders of bone density and structure, unspecified site: Secondary | ICD-10-CM

## 2013-09-11 MED ORDER — ALENDRONATE SODIUM 70 MG PO TABS: 70.0000 mg | ORAL_TABLET | ORAL | Status: DC

## 2013-09-11 NOTE — Telephone Encounter (Signed)
90 day script sent to pharmacy. Margette Fast, CMA

## 2013-09-16 DIAGNOSIS — E119 Type 2 diabetes mellitus without complications: Secondary | ICD-10-CM | POA: Diagnosis not present

## 2013-09-16 DIAGNOSIS — H409 Unspecified glaucoma: Secondary | ICD-10-CM | POA: Diagnosis not present

## 2013-09-16 DIAGNOSIS — H04129 Dry eye syndrome of unspecified lacrimal gland: Secondary | ICD-10-CM | POA: Diagnosis not present

## 2013-09-16 DIAGNOSIS — H02409 Unspecified ptosis of unspecified eyelid: Secondary | ICD-10-CM | POA: Diagnosis not present

## 2013-09-16 DIAGNOSIS — H4040X0 Glaucoma secondary to eye inflammation, unspecified eye, stage unspecified: Secondary | ICD-10-CM | POA: Diagnosis not present

## 2013-09-26 ENCOUNTER — Encounter: Payer: Self-pay | Admitting: Endocrinology

## 2013-09-26 ENCOUNTER — Ambulatory Visit (INDEPENDENT_AMBULATORY_CARE_PROVIDER_SITE_OTHER): Payer: Medicare Other | Admitting: Endocrinology

## 2013-09-26 VITALS — BP 132/90 | HR 63 | Temp 97.5°F | Ht 65.0 in | Wt 153.0 lb

## 2013-09-26 DIAGNOSIS — E119 Type 2 diabetes mellitus without complications: Secondary | ICD-10-CM | POA: Diagnosis not present

## 2013-09-26 NOTE — Progress Notes (Signed)
Subjective:    Patient ID: Carmen Cooper, female    DOB: 11/05/1940, 73 y.o.   MRN: [id number]  HPI pt states DM was dx'ed in 2011; she has mild if any neuropathy of the lower extremities; she has associated TIA; she has been on insulin since dx; pt says her diet and exercise are "ok." she has never had GDM, pancreatitis, severe hypoglycemia or DKA.  She takes lantus and prn novolog (averages approx 20 units per day.  she brings a record of her cbg's which i have reviewed today.  It varies from 65-400.  It is in general higher as the day goes on.   Past Medical History  Diagnosis Date  . Diabetes   . Hypertension   . Thyroid disease     No past surgical history on file.  History   Social History  . Marital Status: Married    Spouse Name: N/A    Number of Children: N/A  . Years of Education: N/A   Occupational History  . Not on file.   Social History Main Topics  . Smoking status: Current Every Day Smoker  . Smokeless tobacco: Not on file  . Alcohol Use: Yes     Comment: 2-3 a day  . Drug Use: No  . Sexual Activity: Not Currently   Other Topics Concern  . Not on file   Social History Narrative  . No narrative on file    Current Outpatient Prescriptions on File Prior to Visit  Medication Sig Dispense Refill  . alendronate (FOSAMAX) 70 MG tablet Take 1 tablet (70 mg total) by mouth every 7 (seven) days. Take with a full glass of water on an empty stomach.  18 tablet  3  . amLODipine-benazepril (LOTREL) 5-20 MG per capsule Take 1 capsule by mouth daily. To replace Azor  90 capsule  1  . aspirin EC 325 MG tablet Take 1 tablet (325 mg total) by mouth daily.  30 tablet  0  . atorvastatin (LIPITOR) 40 MG tablet Take 40 mg by mouth daily.      . cloNIDine (CATAPRES) 0.2 MG tablet Take 0.2 mg by mouth. Take 1/2 tablet one a day      . Insulin Glargine (LANTUS SOLOSTAR) 100 UNIT/ML Solostar Pen Inject 30 Units into the skin at bedtime.   5 pen  PRN  . levothyroxine (SYNTHROID,  LEVOTHROID) 112 MCG tablet Take 1 tablet (112 mcg total) by mouth daily. To replace 149mcg  90 tablet  1  . meloxicam (MOBIC) 15 MG tablet Take 1 tablet (15 mg total) by mouth daily. To prevent knee pain.  30 tablet  0   No current facility-administered medications on file prior to visit.    No Known Allergies  Family History  Problem Relation Age of Onset  . Diabetes Neg Hx     BP 132/90  Pulse 63  Temp(Src) 97.5 F (36.4 C) (Oral)  Ht 5\' 5"  (1.651 m)  Wt 153 lb (69.4 kg)  BMI 25.46 kg/m2  SpO2 95%  Review of Systems denies weight loss, headache, chest pain, sob, n/v, urinary frequency, muscle cramps, excessive diaphoresis, memory loss, depression, cold intolerance, rhinorrhea, and easy bruising.  She has chronically poor vision from the right eye.        Objective:   Physical Exam VS: see vs page GEN: no distress HEAD: head: no deformity eyes: no periorbital swelling, no proptosis external nose and ears are normal mouth: no lesion seen NECK: supple, thyroid is  not enlarged CHEST WALL: no deformity LUNGS:  Clear to auscultation CV: reg rate and rhythm, no murmur ABD: abdomen is soft, nontender.  no hepatosplenomegaly.  not distended.  no hernia MUSCULOSKELETAL: muscle bulk and strength are grossly normal.  no obvious joint swelling.  gait is normal and steady EXTEMITIES: no deformity.  no ulcer on the feet.  feet are of normal color and temp.  Trace bilat leg edema, and bilat varicosities. PULSES: dorsalis pedis intact bilat.  no carotid bruit NEURO:  cn 2-12 grossly intact.   readily moves all 4's.  sensation is intact to touch on the feet SKIN:  Normal texture and temperature.  No rash or suspicious lesion is visible.   NODES:  None palpable at the neck PSYCH: alert, well-oriented.  Does not appear anxious nor depressed.  Lab Results  Component Value Date   HGBA1C 8.8* 08/11/2013   i reviewed electrocardiogram (06/17/12)  i have reviewed the following outside  records: Office notes    Assessment & Plan:  DM: moderate exacerbation.  Based on the pattern of her cbg's, she needs some adjustment in her therapy. Right eye visual loss, new to me: in this setting, i advised to carefully f/u with opthal. TIA: in this setting, pt should avoid hypoglycemia.    Patient is advised the following: Patient Instructions  good diet and exercise habits significanly improve the control of your diabetes.  please let me know if you wish to be referred to a dietician.  high blood sugar is very risky to your health.  you should see an eye doctor and dentist every year.  You are at higher than average risk for pneumonia and hepatitis-B.  You should be vaccinated against both.   controlling your blood pressure and cholesterol drastically reduces the damage diabetes does to your body.  this also applies to quitting smoking.  please discuss these with your doctor.  check your blood sugar three times a day.  vary the time of day when you check, between before the 3 meals, and at bedtime.  also check if you have symptoms of your blood sugar being too high or too low.  please keep a record of the readings and bring it to your next appointment here.  You can write it on any piece of paper.  please call us sooner if your blood sugar goes below 70, or if you have a lot of readings over 200. For now, please reduce the lantus to 30 units at bedtime, and:  Take humalog 10 units 3 times a day (just before each meal), no matter what your blood sugar is.   Please come back for a follow-up appointment in 2 months.  Please call next week, to tell us how your blood sugar is.

## 2013-09-26 NOTE — Patient Instructions (Addendum)
good diet and exercise habits significanly improve the control of your diabetes.  please let me know if you wish to be referred to a dietician.  high blood sugar is very risky to your health.  you should see an eye doctor and dentist every year.  You are at higher than average risk for pneumonia and hepatitis-B.  You should be vaccinated against both.   controlling your blood pressure and cholesterol drastically reduces the damage diabetes does to your body.  this also applies to quitting smoking.  please discuss these with your doctor.  check your blood sugar three times a day.  vary the time of day when you check, between before the 3 meals, and at bedtime.  also check if you have symptoms of your blood sugar being too high or too low.  please keep a record of the readings and bring it to your next appointment here.  You can write it on any piece of paper.  please call us sooner if your blood sugar goes below 70, or if you have a lot of readings over 200. For now, please reduce the lantus to 30 units at bedtime, and:  Take humalog 10 units 3 times a day (just before each meal), no matter what your blood sugar is.   Please come back for a follow-up appointment in 2 months.  Please call next week, to tell us how your blood sugar is.

## 2013-09-29 ENCOUNTER — Telehealth: Payer: Self-pay | Admitting: Endocrinology

## 2013-09-29 NOTE — Telephone Encounter (Signed)
Pt blood sugar has just continued to drop since this AM she cannot get it up  Her reading was 6:30 this am 92 then 8:38 am 52, now it is 47 after drinking orange juice and ate cake

## 2013-09-29 NOTE — Telephone Encounter (Signed)
Pt advised. She states that he sugar is coming back up and she will start new Lantus dosages this evening. Pt instucted to call office back if her reading did not get back within the normal range.

## 2013-09-29 NOTE — Telephone Encounter (Signed)
Please decrease lantus to 20 units at bedtime. Please continue the same humalog

## 2013-09-29 NOTE — Telephone Encounter (Signed)
See below, I spoke with pt she confirmed that she is taking 10 units of Humalog 3 times per day and 32 units of Lantus at bedtime. Pt is currently drinking orange juice and checking blood sugar every 30 mis.  Please advise, Thanks!

## 2013-09-30 ENCOUNTER — Telehealth: Payer: Self-pay | Admitting: Endocrinology

## 2013-09-30 NOTE — Telephone Encounter (Signed)
See below. Pt's insulin was changed yesterday, pt is currently on 20 units of Lantus and 10 units of Humalog 3 times per day.  Please advise, Thanks!

## 2013-09-30 NOTE — Telephone Encounter (Signed)
Pt advised of new instructions. Pt voiced understanding.

## 2013-09-30 NOTE — Telephone Encounter (Signed)
Please reduce lantus to 10units qhs

## 2013-09-30 NOTE — Telephone Encounter (Signed)
Please return pt call regarding blood sugar still not going up this am 630 it was 94 and at 08:45 it 52. Please advise

## 2013-10-14 DIAGNOSIS — H4040X0 Glaucoma secondary to eye inflammation, unspecified eye, stage unspecified: Secondary | ICD-10-CM | POA: Diagnosis not present

## 2013-10-14 DIAGNOSIS — H409 Unspecified glaucoma: Secondary | ICD-10-CM | POA: Diagnosis not present

## 2013-10-31 ENCOUNTER — Ambulatory Visit (INDEPENDENT_AMBULATORY_CARE_PROVIDER_SITE_OTHER): Payer: Medicare Other | Admitting: Family Medicine

## 2013-10-31 ENCOUNTER — Encounter: Payer: Self-pay | Admitting: Family Medicine

## 2013-10-31 VITALS — BP 152/79 | HR 73 | Wt 155.0 lb

## 2013-10-31 DIAGNOSIS — L255 Unspecified contact dermatitis due to plants, except food: Secondary | ICD-10-CM

## 2013-10-31 DIAGNOSIS — L237 Allergic contact dermatitis due to plants, except food: Secondary | ICD-10-CM

## 2013-10-31 DIAGNOSIS — E119 Type 2 diabetes mellitus without complications: Secondary | ICD-10-CM | POA: Diagnosis not present

## 2013-10-31 MED ORDER — METHYLPREDNISOLONE ACETATE 80 MG/ML IJ SUSP
80.0000 mg | Freq: Once | INTRAMUSCULAR | Status: AC
  Administered 2013-10-31: 80 mg via INTRAMUSCULAR

## 2013-10-31 MED ORDER — HYDROXYZINE HCL 25 MG PO TABS: 25.0000 mg | ORAL_TABLET | Freq: Three times a day (TID) | ORAL | Status: DC | PRN

## 2013-10-31 NOTE — Progress Notes (Signed)
CC: Carmen Cooper is a 73 y.o. female is here for Poison Ivy   Subjective: HPI:  Complains of itching involving the thighs, lower legs, forearms, and left face that has been present for the past 3 days. Symptoms came on 2 days after she was exposed to poison ivy while working in her garden. Interventions have included cold compresses, topical anti-inflammatory over-the-counter cream.  Symptoms are moderate in severity. Present all hours today. Other than above nothing seems to make better or worse. She gets this whenever she is known exposures to poison ivy or oak. Denies fevers, chills, shortness of breath, ocular pain.  Followup type 2 diabetes: Currently taking 30 units of Lantus on a daily basis around 11 PM every evening along with 10 units of lispro at mealtime. Blood sugars have been ranging from 52-400 she's unable to find any consistency or pattern to when her blood sugar spike, dip, or influenced by food. She admits to still having difficulty with remembering to wake up at 11 PM to take Lantus.  Denies polyuria polyphagia polydipsia nor poorly healing wounds other than the rash described above. She's establish with endocrinology in [place] however transportation is complicating her potential for followup   Review Of Systems Outlined In HPI  Past Medical History  Diagnosis Date  . Diabetes   . Hypertension   . Thyroid disease     No past surgical history on file. Family History  Problem Relation Age of Onset  . Diabetes Neg Hx     History   Social History  . Marital Status: Married    Spouse Name: N/A    Number of Children: N/A  . Years of Education: N/A   Occupational History  . Not on file.   Social History Main Topics  . Smoking status: Current Every Day Smoker  . Smokeless tobacco: Not on file  . Alcohol Use: Yes     Comment: 2-3 a day  . Drug Use: No  . Sexual Activity: Not Currently   Other Topics Concern  . Not on file   Social History Narrative  . No  narrative on file     Objective: BP 152/79  Pulse 73  Wt 155 lb (70.308 kg)  General: Alert and Oriented, No Acute Distress HEENT: Pupils equal, round, reactive to light. Conjunctivae clear.  Moist membranes pharynx unremarkable Lungs: Clear to auscultation bilaterally, no wheezing/ronchi/rales.  Comfortable work of breathing. Good air movement. Cardiac: Regular rate and rhythm. Normal S1/S2.  No murmurs, rubs, nor gallops.   Extremities: No peripheral edema.  Strong peripheral pulses.  Mental Status: No depression, anxiety, nor agitation. Skin: Warm and dry. Erythematous streaks with centralized small clear colored vesicles on the left face, forearms, and shins  Assessment & Plan: Kegan was seen today for poison ivy.  Diagnoses and associated orders for this visit:  Type 2 diabetes mellitus without complication - Ambulatory referral to Endocrinology  Poison ivy dermatitis - hydrOXYzine (ATARAX/VISTARIL) 25 MG tablet; Take 1 tablet (25 mg total) by mouth 3 (three) times daily as needed for itching.    Type 2 diabetes: Uncontrolled, due to transportation issues we will refer her to Dr. Hartford Poli within the [facility] system since his office is here in [place]. I reminded her that she can take her Lantus anytime of the day as long as it's consistent and she does not have that set for 11 PM since it's difficult for her to wake up at that time.  Poison ivy dermatitis: Hydroxyzine provided on  an as-needed basis. She received Depo-Medrol 80 mg today instead of a long prednisone taper given her diabetes issues   Return if symptoms worsen or fail to improve.

## 2013-10-31 NOTE — Addendum Note (Signed)
Addended by: Terance Hart on: 10/31/2013 10:36 AM   Modules accepted: Orders

## 2013-11-10 ENCOUNTER — Ambulatory Visit: Payer: Medicare Other | Admitting: Family Medicine

## 2013-11-11 ENCOUNTER — Ambulatory Visit (INDEPENDENT_AMBULATORY_CARE_PROVIDER_SITE_OTHER): Payer: Medicare Other | Admitting: Family Medicine

## 2013-11-11 ENCOUNTER — Encounter: Payer: Self-pay | Admitting: Family Medicine

## 2013-11-11 VITALS — BP 149/75 | HR 68 | Wt 153.0 lb

## 2013-11-11 DIAGNOSIS — I10 Essential (primary) hypertension: Secondary | ICD-10-CM

## 2013-11-11 DIAGNOSIS — Z23 Encounter for immunization: Secondary | ICD-10-CM

## 2013-11-11 DIAGNOSIS — E031 Congenital hypothyroidism without goiter: Secondary | ICD-10-CM

## 2013-11-11 DIAGNOSIS — E119 Type 2 diabetes mellitus without complications: Secondary | ICD-10-CM

## 2013-11-11 LAB — TSH: TSH: 3.931 u[IU]/mL (ref 0.350–4.500)

## 2013-11-11 LAB — POCT GLYCOSYLATED HEMOGLOBIN (HGB A1C): Hemoglobin A1C: 9.1

## 2013-11-11 MED ORDER — INSULIN LISPRO 100 UNIT/ML (KWIKPEN): PEN_INJECTOR | SUBCUTANEOUS | Status: DC

## 2013-11-11 MED ORDER — AMLODIPINE BESY-BENAZEPRIL HCL 10-20 MG PO CAPS: 1.0000 | ORAL_CAPSULE | Freq: Every day | ORAL | Status: DC

## 2013-11-12 ENCOUNTER — Other Ambulatory Visit: Payer: Self-pay | Admitting: Emergency Medicine

## 2013-11-12 NOTE — Progress Notes (Signed)
CC: Carmen Cooper is a 73 y.o. female is here for Diabetes   Subjective: HPI:  Followup type 2 diabetes: Continues to take 30 units of Lantus daily just before bed or at dinnertime, 10 units of Humalog with breakfast lunch and dinner. She brings in fasting blood sugars that are consistently between 200-350, postprandial blood sugars are 99% of the time between 200-350 as well. She had 3 hypoglycemic episodes since I saw her last in the 38s they were accompanied by shaking that resolved within minutes after drinking juice.  She denies polyuria polyphasia or polydipsia. She has a [facility] endocrinology appointment in 2 weeks.  Followup essential hypertension: Continues to take Lotrel 5-20 on a daily basis with no outside blood pressures to report.  Denies chest pain shortness of breath orthopnea nor peripheral edema  Followup hypothyroidism: Since her last visit we increased levothyroxine. She's been taking this new dose of 112 mcg on a daily basis without unintentional weight gain or loss nor any GI disturbance.  Review Of Systems Outlined In HPI  Past Medical History  Diagnosis Date  . Diabetes   . Hypertension   . Thyroid disease     No past surgical history on file. Family History  Problem Relation Age of Onset  . Diabetes Neg Hx     History   Social History  . Marital Status: Married    Spouse Name: N/A    Number of Children: N/A  . Years of Education: N/A   Occupational History  . Not on file.   Social History Main Topics  . Smoking status: Current Every Day Smoker  . Smokeless tobacco: Not on file  . Alcohol Use: Yes     Comment: 2-3 a day  . Drug Use: No  . Sexual Activity: Not Currently   Other Topics Concern  . Not on file   Social History Narrative  . No narrative on file     Objective: BP 149/75  Pulse 68  Wt 153 lb (69.4 kg)  General: Alert and Oriented, No Acute Distress HEENT: Pupils equal, round, reactive to light. Conjunctivae clear.  Moist  membranes pharynx unremarkable Lungs: Clear to auscultation bilaterally, no wheezing/ronchi/rales.  Comfortable work of breathing. Good air movement. Cardiac: Regular rate and rhythm. Normal S1/S2.  No murmurs, rubs, nor gallops.   Extremities: No peripheral edema.  Strong peripheral pulses.  Mental Status: No depression, anxiety, nor agitation. Skin: Warm and dry.  Assessment & Plan: Mairyn was seen today for diabetes.  Diagnoses and associated orders for this visit:  Type 2 diabetes mellitus without complication - POCT HgB K0U  Essential hypertension, benign  Congenital hypothyroidism without goiter - TSH  Need for prophylactic vaccination and inoculation against influenza  Other Orders - insulin lispro (HUMALOG) 100 UNIT/ML KiwkPen; 10 Units SQ with breakfast and lunch, 15 Units with dinner. - amLODipine-benazepril (LOTREL) 10-20 MG per capsule; Take 1 capsule by mouth daily.    Type 2 diabetes: Uncontrolled chronic condition, keep new appointment with [facility] in 2 weeks, her blood sugar log shows that the majority of her strongly abnormal blood sugars are occurring with dinner therefore increasing Humalog to 15 units at that time keep 10 units with all of her meals. Continue current Lantus regimen. Essential hypertension: Uncontrolled increasing the dose of Lotrel Hypothyroidism: Clinically improved due for TSH today   Return in about 3 months (around 02/10/2014) for Thyroid.

## 2013-11-14 ENCOUNTER — Other Ambulatory Visit: Payer: Self-pay

## 2013-11-14 ENCOUNTER — Telehealth: Payer: Self-pay | Admitting: *Deleted

## 2013-11-14 NOTE — Telephone Encounter (Signed)
Pt.notified

## 2013-11-14 NOTE — Telephone Encounter (Signed)
Yes continue the clonidine unless she experiences lightheadedness 30-60 minutes after taking it with the new dose of Lotrel

## 2013-11-14 NOTE — Telephone Encounter (Signed)
Refill request for clonidine 0.2 mg half tablet daily [facility]. This medication is under a historical provider. Please advise.

## 2013-11-14 NOTE — Telephone Encounter (Signed)
Is the patient supposed to continue the clonidine? In addition to the increased dose of the Lotrel

## 2013-11-17 MED ORDER — CLONIDINE HCL 0.2 MG PO TABS: 0.1000 mg | ORAL_TABLET | Freq: Every day | ORAL | Status: DC

## 2013-11-17 NOTE — Telephone Encounter (Signed)
rx refilled.

## 2013-11-26 ENCOUNTER — Ambulatory Visit: Payer: Medicare Other | Admitting: Endocrinology

## 2013-11-26 DIAGNOSIS — E1065 Type 1 diabetes mellitus with hyperglycemia: Secondary | ICD-10-CM | POA: Diagnosis not present

## 2013-11-26 DIAGNOSIS — E785 Hyperlipidemia, unspecified: Secondary | ICD-10-CM | POA: Diagnosis not present

## 2013-11-26 DIAGNOSIS — I10 Essential (primary) hypertension: Secondary | ICD-10-CM | POA: Diagnosis not present

## 2013-11-26 DIAGNOSIS — E039 Hypothyroidism, unspecified: Secondary | ICD-10-CM | POA: Diagnosis not present

## 2013-11-27 ENCOUNTER — Other Ambulatory Visit: Payer: Self-pay | Admitting: Family Medicine

## 2013-11-27 DIAGNOSIS — H2000 Unspecified acute and subacute iridocyclitis: Secondary | ICD-10-CM | POA: Diagnosis not present

## 2013-11-27 DIAGNOSIS — Z8673 Personal history of transient ischemic attack (TIA), and cerebral infarction without residual deficits: Secondary | ICD-10-CM | POA: Diagnosis not present

## 2013-11-27 DIAGNOSIS — Z9889 Other specified postprocedural states: Secondary | ICD-10-CM | POA: Diagnosis not present

## 2013-11-27 DIAGNOSIS — F17210 Nicotine dependence, cigarettes, uncomplicated: Secondary | ICD-10-CM | POA: Diagnosis not present

## 2013-11-27 DIAGNOSIS — H35371 Puckering of macula, right eye: Secondary | ICD-10-CM | POA: Diagnosis not present

## 2013-11-27 DIAGNOSIS — E119 Type 2 diabetes mellitus without complications: Secondary | ICD-10-CM | POA: Diagnosis not present

## 2013-11-27 DIAGNOSIS — H4041X1 Glaucoma secondary to eye inflammation, right eye, mild stage: Secondary | ICD-10-CM | POA: Diagnosis not present

## 2013-12-23 DIAGNOSIS — E109 Type 1 diabetes mellitus without complications: Secondary | ICD-10-CM | POA: Diagnosis not present

## 2013-12-23 DIAGNOSIS — H209 Unspecified iridocyclitis: Secondary | ICD-10-CM | POA: Diagnosis not present

## 2013-12-23 DIAGNOSIS — H4043X3 Glaucoma secondary to eye inflammation, bilateral, severe stage: Secondary | ICD-10-CM | POA: Diagnosis not present

## 2013-12-23 DIAGNOSIS — H04123 Dry eye syndrome of bilateral lacrimal glands: Secondary | ICD-10-CM | POA: Diagnosis not present

## 2013-12-24 DIAGNOSIS — E785 Hyperlipidemia, unspecified: Secondary | ICD-10-CM | POA: Diagnosis not present

## 2013-12-24 DIAGNOSIS — I10 Essential (primary) hypertension: Secondary | ICD-10-CM | POA: Diagnosis not present

## 2013-12-24 DIAGNOSIS — E039 Hypothyroidism, unspecified: Secondary | ICD-10-CM | POA: Diagnosis not present

## 2013-12-24 DIAGNOSIS — E1065 Type 1 diabetes mellitus with hyperglycemia: Secondary | ICD-10-CM | POA: Diagnosis not present

## 2014-01-05 DIAGNOSIS — H53453 Other localized visual field defect, bilateral: Secondary | ICD-10-CM | POA: Diagnosis not present

## 2014-01-05 DIAGNOSIS — H04123 Dry eye syndrome of bilateral lacrimal glands: Secondary | ICD-10-CM | POA: Diagnosis not present

## 2014-01-05 DIAGNOSIS — H02423 Myogenic ptosis of bilateral eyelids: Secondary | ICD-10-CM | POA: Diagnosis not present

## 2014-01-05 DIAGNOSIS — H0289 Other specified disorders of eyelid: Secondary | ICD-10-CM | POA: Diagnosis not present

## 2014-01-14 ENCOUNTER — Telehealth: Payer: Self-pay | Admitting: *Deleted

## 2014-01-14 ENCOUNTER — Other Ambulatory Visit: Payer: Self-pay | Admitting: *Deleted

## 2014-01-14 MED ORDER — AMLODIPINE BESY-BENAZEPRIL HCL 10-20 MG PO CAPS: 1.0000 | ORAL_CAPSULE | Freq: Every day | ORAL | Status: DC

## 2014-01-14 MED ORDER — AMBULATORY NON FORMULARY MEDICATION: Status: DC

## 2014-01-14 MED ORDER — ATORVASTATIN CALCIUM 40 MG PO TABS: 40.0000 mg | ORAL_TABLET | Freq: Every day | ORAL | Status: DC

## 2014-01-14 MED ORDER — CLONIDINE HCL 0.2 MG PO TABS: 0.1000 mg | ORAL_TABLET | Freq: Every day | ORAL | Status: DC

## 2014-01-14 NOTE — Telephone Encounter (Signed)
Test stripssent

## 2014-01-15 DIAGNOSIS — H4043X3 Glaucoma secondary to eye inflammation, bilateral, severe stage: Secondary | ICD-10-CM | POA: Diagnosis not present

## 2014-01-19 DIAGNOSIS — E1069 Type 1 diabetes mellitus with other specified complication: Secondary | ICD-10-CM | POA: Diagnosis not present

## 2014-01-19 DIAGNOSIS — Z713 Dietary counseling and surveillance: Secondary | ICD-10-CM | POA: Diagnosis not present

## 2014-01-19 DIAGNOSIS — I10 Essential (primary) hypertension: Secondary | ICD-10-CM | POA: Diagnosis not present

## 2014-01-19 DIAGNOSIS — E785 Hyperlipidemia, unspecified: Secondary | ICD-10-CM | POA: Diagnosis not present

## 2014-01-19 DIAGNOSIS — E1065 Type 1 diabetes mellitus with hyperglycemia: Secondary | ICD-10-CM | POA: Diagnosis not present

## 2014-01-28 DIAGNOSIS — E039 Hypothyroidism, unspecified: Secondary | ICD-10-CM | POA: Diagnosis not present

## 2014-01-28 DIAGNOSIS — E1065 Type 1 diabetes mellitus with hyperglycemia: Secondary | ICD-10-CM | POA: Diagnosis not present

## 2014-01-28 DIAGNOSIS — I10 Essential (primary) hypertension: Secondary | ICD-10-CM | POA: Diagnosis not present

## 2014-01-28 DIAGNOSIS — E785 Hyperlipidemia, unspecified: Secondary | ICD-10-CM | POA: Diagnosis not present

## 2014-02-09 DIAGNOSIS — F17210 Nicotine dependence, cigarettes, uncomplicated: Secondary | ICD-10-CM | POA: Diagnosis not present

## 2014-02-09 DIAGNOSIS — H43391 Other vitreous opacities, right eye: Secondary | ICD-10-CM | POA: Diagnosis not present

## 2014-02-09 DIAGNOSIS — H4089 Other specified glaucoma: Secondary | ICD-10-CM | POA: Diagnosis not present

## 2014-02-09 DIAGNOSIS — E039 Hypothyroidism, unspecified: Secondary | ICD-10-CM | POA: Diagnosis not present

## 2014-02-09 DIAGNOSIS — I10 Essential (primary) hypertension: Secondary | ICD-10-CM | POA: Diagnosis not present

## 2014-02-09 DIAGNOSIS — H2013 Chronic iridocyclitis, bilateral: Secondary | ICD-10-CM | POA: Diagnosis not present

## 2014-02-09 DIAGNOSIS — H209 Unspecified iridocyclitis: Secondary | ICD-10-CM | POA: Diagnosis not present

## 2014-02-09 DIAGNOSIS — Z794 Long term (current) use of insulin: Secondary | ICD-10-CM | POA: Diagnosis not present

## 2014-02-09 DIAGNOSIS — H4041X1 Glaucoma secondary to eye inflammation, right eye, mild stage: Secondary | ICD-10-CM | POA: Diagnosis not present

## 2014-02-09 DIAGNOSIS — E119 Type 2 diabetes mellitus without complications: Secondary | ICD-10-CM | POA: Diagnosis not present

## 2014-02-09 DIAGNOSIS — H35371 Puckering of macula, right eye: Secondary | ICD-10-CM | POA: Diagnosis not present

## 2014-02-09 DIAGNOSIS — Z8673 Personal history of transient ischemic attack (TIA), and cerebral infarction without residual deficits: Secondary | ICD-10-CM | POA: Diagnosis not present

## 2014-02-09 DIAGNOSIS — H59031 Cystoid macular edema following cataract surgery, right eye: Secondary | ICD-10-CM | POA: Diagnosis not present

## 2014-02-09 DIAGNOSIS — H35351 Cystoid macular degeneration, right eye: Secondary | ICD-10-CM | POA: Diagnosis not present

## 2014-02-09 DIAGNOSIS — E11311 Type 2 diabetes mellitus with unspecified diabetic retinopathy with macular edema: Secondary | ICD-10-CM | POA: Diagnosis not present

## 2014-02-10 ENCOUNTER — Encounter: Payer: Self-pay | Admitting: Family Medicine

## 2014-02-10 ENCOUNTER — Ambulatory Visit (INDEPENDENT_AMBULATORY_CARE_PROVIDER_SITE_OTHER): Payer: Medicare Other | Admitting: Family Medicine

## 2014-02-10 VITALS — BP 128/73 | HR 67 | Ht 65.0 in | Wt 154.0 lb

## 2014-02-10 DIAGNOSIS — E031 Congenital hypothyroidism without goiter: Secondary | ICD-10-CM | POA: Diagnosis not present

## 2014-02-10 DIAGNOSIS — E1122 Type 2 diabetes mellitus with diabetic chronic kidney disease: Secondary | ICD-10-CM | POA: Diagnosis not present

## 2014-02-10 DIAGNOSIS — R1310 Dysphagia, unspecified: Secondary | ICD-10-CM | POA: Diagnosis not present

## 2014-02-10 DIAGNOSIS — I10 Essential (primary) hypertension: Secondary | ICD-10-CM

## 2014-02-10 DIAGNOSIS — R609 Edema, unspecified: Secondary | ICD-10-CM | POA: Diagnosis not present

## 2014-02-10 DIAGNOSIS — N189 Chronic kidney disease, unspecified: Secondary | ICD-10-CM | POA: Diagnosis not present

## 2014-02-10 MED ORDER — FUROSEMIDE 20 MG PO TABS: 20.0000 mg | ORAL_TABLET | Freq: Every day | ORAL | Status: DC | PRN

## 2014-02-10 NOTE — Progress Notes (Addendum)
CC: Carmen Cooper is a 73 y.o. female is here for Follow-up   Subjective: HPI:  Follow-up hypothyroidism: Continues to take 112 g levothyroxine on a daily basis. There has been no unintentional weight gain or loss since I saw her last. Denies any skin changes or hair changes. No constipation or diarrhea   Follow-up type 2 diabetes: She tells me she is having difficulty understanding nutrition labels exactly how many carbohydrates she is taking an per serving. She seeing Dr. Hartford Poli is helping titrate her insulin regimen. She continues to have widely fluctuating blood sugars anywhere in the 300s down to the double digits. She states she feels her best when her blood sugars at 200 or above and worst when it is below 200. Denies any polyuria plication polydipsia nor poorly healing wounds  Follow-up essential hypertension: At her last visit we increased Lotrel. No outside blood pressures to report. Denies any new side effects or intolerance. No chest pain shortness of breath orthopnea she has developed edema in the lower extremities that she's never had before. She states it's been there for about one month. It's absent first thing in the morning and gets worse as the day progresses. Nothing particularly makes it better that she knows of. It is painful and described as a stretching sensation of her skin..  On the way out of our encounter she mentions that she's had some difficulty swallowing with both solids and liquids occurring randomly throughout the day and every now and then during the week. She says it's not predictable enough in particular makes it better or worse. She states that she has it maybe once or twice a week for the last 2 weeks.  Review Of Systems Outlined In HPI  Past Medical History  Diagnosis Date  . Diabetes   . Hypertension   . Thyroid disease     No past surgical history on file. Family History  Problem Relation Age of Onset  . Diabetes Neg Hx     History   Social History   . Marital Status: Married    Spouse Name: N/A    Number of Children: N/A  . Years of Education: N/A   Occupational History  . Not on file.   Social History Main Topics  . Smoking status: Current Every Day Smoker  . Smokeless tobacco: Not on file  . Alcohol Use: Yes     Comment: 2-3 a day  . Drug Use: No  . Sexual Activity: Not Currently   Other Topics Concern  . Not on file   Social History Narrative     Objective: BP 128/73 mmHg  Pulse 67  Ht 5\' 5"  (1.651 m)  Wt 154 lb (69.854 kg)  BMI 25.63 kg/m2  General: Alert and Oriented, No Acute Distress HEENT: Pupils equal, round, reactive to light. Conjunctivae clearr Moist mucous membranes pharynx unremarkable Lungs: Clear to auscultation bilaterally, no wheezing/ronchi/rales.  Comfortable work of breathing. Good air movement. Cardiac: Regular rate and rhythm. Normal S1/S2.  No murmurs, rubs, nor gallops.   Extremities: No peripheral edema.  Strong peripheral pulses.  Mental Status: No depression, anxiety, nor agitation. Skin: Warm and dry.  Assessment & Plan: [name] was seen today for follow-up.  Diagnoses and associated orders for this visit:  Congenital hypothyroidism without goiter - TSH  Type 2 diabetes mellitus with diabetic chronic kidney disease - Hemoglobin A1c - Microalbumin / creatinine urine ratio  Essential hypertension, benign  Dysphagia  Edema  Other Orders - furosemide (LASIX) 20 MG  tablet; Take 1 tablet (20 mg total) by mouth daily as needed for fluid.     hypothyroidism: Rechecking TSH she'll need refills on levothyroxine pending these results, we'll also need to forward results to Dr. Hartford Poli   essential hypertension: Improved, continue current dose of clonidine and Lotrel Type 2 diabetes: In the past year she's not had a microalbumin to creatinine ratio, we will check this today Edema: Trial of furosemide, if this is not beneficial will advise on using compression stockings for my suspicion of  venous insufficiency. Type 2 diabetes: Time was taken to go over a nutrition label and how to use servings and was to carbohydrates to carbohydrate count. She has a visit with a diabetic educator within the next week. Dysphagia: Call if persistent, such as occuring five days in a row or greater, next step would be ordering barium swallow study  Return in about 3 months (around 05/12/2014).

## 2014-02-11 ENCOUNTER — Telehealth: Payer: Self-pay | Admitting: Family Medicine

## 2014-02-11 ENCOUNTER — Other Ambulatory Visit: Payer: Self-pay

## 2014-02-11 DIAGNOSIS — E1065 Type 1 diabetes mellitus with hyperglycemia: Secondary | ICD-10-CM | POA: Diagnosis not present

## 2014-02-11 LAB — HEMOGLOBIN A1C
Hgb A1c MFr Bld: 10.5 % — ABNORMAL HIGH (ref <5.7)
MEAN PLASMA GLUCOSE: 255 mg/dL — AB (ref <117)

## 2014-02-11 LAB — MICROALBUMIN / CREATININE URINE RATIO
CREATININE, URINE: 92.7 mg/dL
MICROALB UR: 9.8 mg/dL — AB (ref <2.0)
Microalb Creat Ratio: 105.7 mg/g — ABNORMAL HIGH (ref 0.0–30.0)

## 2014-02-11 LAB — TSH: TSH: 1.549 u[IU]/mL (ref 0.350–4.500)

## 2014-02-11 MED ORDER — LEVOTHYROXINE SODIUM 112 MCG PO TABS: 112.0000 ug | ORAL_TABLET | Freq: Every day | ORAL | Status: DC

## 2014-02-11 NOTE — Telephone Encounter (Signed)
Patient wanted [facility]ine to go [facility] of [facility] so that change was made. Ramisa Duman,CMA

## 2014-02-11 NOTE — Telephone Encounter (Signed)
Refill

## 2014-02-25 DIAGNOSIS — H43391 Other vitreous opacities, right eye: Secondary | ICD-10-CM | POA: Diagnosis not present

## 2014-02-25 DIAGNOSIS — H2013 Chronic iridocyclitis, bilateral: Secondary | ICD-10-CM | POA: Diagnosis not present

## 2014-02-25 DIAGNOSIS — H4041X1 Glaucoma secondary to eye inflammation, right eye, mild stage: Secondary | ICD-10-CM | POA: Diagnosis not present

## 2014-02-25 DIAGNOSIS — H35351 Cystoid macular degeneration, right eye: Secondary | ICD-10-CM | POA: Diagnosis not present

## 2014-02-26 DIAGNOSIS — E785 Hyperlipidemia, unspecified: Secondary | ICD-10-CM | POA: Diagnosis not present

## 2014-02-26 DIAGNOSIS — E039 Hypothyroidism, unspecified: Secondary | ICD-10-CM | POA: Diagnosis not present

## 2014-02-26 DIAGNOSIS — E1065 Type 1 diabetes mellitus with hyperglycemia: Secondary | ICD-10-CM | POA: Diagnosis not present

## 2014-02-26 DIAGNOSIS — E1021 Type 1 diabetes mellitus with diabetic nephropathy: Secondary | ICD-10-CM | POA: Diagnosis not present

## 2014-02-26 DIAGNOSIS — I10 Essential (primary) hypertension: Secondary | ICD-10-CM | POA: Diagnosis not present

## 2014-03-10 DIAGNOSIS — E1065 Type 1 diabetes mellitus with hyperglycemia: Secondary | ICD-10-CM | POA: Diagnosis not present

## 2014-03-10 DIAGNOSIS — E1021 Type 1 diabetes mellitus with diabetic nephropathy: Secondary | ICD-10-CM | POA: Diagnosis not present

## 2014-03-16 ENCOUNTER — Encounter: Payer: Self-pay | Admitting: Family Medicine

## 2014-03-16 ENCOUNTER — Ambulatory Visit (INDEPENDENT_AMBULATORY_CARE_PROVIDER_SITE_OTHER): Payer: Medicare Other | Admitting: Family Medicine

## 2014-03-16 VITALS — BP 133/72 | HR 73 | Wt 156.0 lb

## 2014-03-16 DIAGNOSIS — N189 Chronic kidney disease, unspecified: Secondary | ICD-10-CM

## 2014-03-16 DIAGNOSIS — L821 Other seborrheic keratosis: Secondary | ICD-10-CM

## 2014-03-16 DIAGNOSIS — R609 Edema, unspecified: Secondary | ICD-10-CM | POA: Diagnosis not present

## 2014-03-16 DIAGNOSIS — E1122 Type 2 diabetes mellitus with diabetic chronic kidney disease: Secondary | ICD-10-CM

## 2014-03-16 MED ORDER — FUROSEMIDE 20 MG PO TABS: ORAL_TABLET | ORAL | Status: DC

## 2014-03-16 NOTE — Progress Notes (Signed)
CC: Carmen Cooper is a 74 y.o. female is here for Foot Swelling   Subjective: HPI:   worsening edema in both lower extremity is. It's worse at the end of the day it improves after sleeping. Elevation of the leg throughout the day does not seem to help either of the legs. The swelling is symmetrical. It's now begun to be painful when it is moderately swollen. She denies any skin changes overlying the site swelling that she localizes to the middle of the shins distally but sparing the toes. She denies edema elsewhere. She denies shortness of breath or orthopnea. Other than above nothing seems to make the symptoms better or worse. She never received Lasix in the mail.  She has spots on her back that are painless that have been developing over the past years. They grown a yearly basis. She's had some on her arms but she literally scratch them off in the past.no interventions as of yet  She wants to know some more about how chronic kidney diseases associated with diabetes.     Review Of Systems Outlined In HPI  Past Medical History  Diagnosis Date  . Diabetes   . Hypertension   . Thyroid disease     No past surgical history on file. Family History  Problem Relation Age of Onset  . Diabetes Neg Hx     History   Social History  . Marital Status: Married    Spouse Name: N/A    Number of Children: N/A  . Years of Education: N/A   Occupational History  . Not on file.   Social History Main Topics  . Smoking status: Current Every Day Smoker  . Smokeless tobacco: Not on file  . Alcohol Use: Yes     Comment: 2-3 a day  . Drug Use: No  . Sexual Activity: Not Currently   Other Topics Concern  . Not on file   Social History Narrative     Objective: BP 133/72 mmHg  Pulse 73  Wt 156 lb (70.761 kg)  General: Alert and Oriented, No Acute Distress HEENT: Pupils equal, round, reactive to light. Conjunctivae clear.  Moist mucous membranes pharynx unremarkable Lungs: Clear to  auscultation bilaterally, no wheezing/ronchi/rales.  Comfortable work of breathing. Good air movement. Cardiac: Regular rate and rhythm. Normal S1/S2.  No murmurs, rubs, nor gallops.   Extremities: 1+ pitting edema from the shins distally.  Strong peripheral pulses.  Mental Status: No depression, anxiety, nor agitation. Skin: Warm and dry. Single noninflamed seborrheic keratosis on the right flank  Assessment & Plan: Carmen Cooper was seen today for foot swelling.  Diagnoses and associated orders for this visit:  Edema - furosemide (LASIX) 20 MG tablet; One by mouth one to two times a day as needed for swelling in the legs.  Type 2 diabetes mellitus with diabetic chronic kidney disease  Seborrheic keratoses    Edema: Suspect venous insufficiency, she is not interested in wearing compression stockings. Begin Lasix, a prescriptionwas sent to a local pharmacy to avoid getting lost in the mail again. Seborrheic keratosis: Discussed benign nature of these lesions since they're painful she is understandably not interested in removal Time was taken to discuss pathologic effect of uncontrolled diabetes and blood pressure on kidney function. Time was taken to answer all of her questions.  25 minutes spent face-to-face during visit today of which at least 50% was counseling or coordinating care regarding: 1. Edema   2. Type 2 diabetes mellitus with diabetic chronic kidney disease  3. Seborrheic keratoses      Return if symptoms worsen or fail to improve.

## 2014-03-19 DIAGNOSIS — H04123 Dry eye syndrome of bilateral lacrimal glands: Secondary | ICD-10-CM | POA: Diagnosis not present

## 2014-03-19 DIAGNOSIS — H4043X3 Glaucoma secondary to eye inflammation, bilateral, severe stage: Secondary | ICD-10-CM | POA: Diagnosis not present

## 2014-03-19 DIAGNOSIS — E109 Type 1 diabetes mellitus without complications: Secondary | ICD-10-CM | POA: Diagnosis not present

## 2014-03-19 DIAGNOSIS — H209 Unspecified iridocyclitis: Secondary | ICD-10-CM | POA: Diagnosis not present

## 2014-03-20 ENCOUNTER — Encounter: Payer: Self-pay | Admitting: Family Medicine

## 2014-03-20 DIAGNOSIS — H209 Unspecified iridocyclitis: Secondary | ICD-10-CM | POA: Insufficient documentation

## 2014-03-20 DIAGNOSIS — H4040X0 Glaucoma secondary to eye inflammation, unspecified eye, stage unspecified: Secondary | ICD-10-CM

## 2014-03-20 HISTORY — DX: Unspecified iridocyclitis: H20.9

## 2014-03-20 HISTORY — DX: Unspecified iridocyclitis: H40.40X0

## 2014-04-01 ENCOUNTER — Other Ambulatory Visit: Payer: Self-pay | Admitting: *Deleted

## 2014-04-01 ENCOUNTER — Other Ambulatory Visit: Payer: Self-pay | Admitting: Specialist

## 2014-04-01 ENCOUNTER — Ambulatory Visit (INDEPENDENT_AMBULATORY_CARE_PROVIDER_SITE_OTHER): Payer: Medicare Other

## 2014-04-01 DIAGNOSIS — H3022 Posterior cyclitis, left eye: Secondary | ICD-10-CM | POA: Diagnosis not present

## 2014-04-01 DIAGNOSIS — H3023 Posterior cyclitis, bilateral: Secondary | ICD-10-CM | POA: Diagnosis not present

## 2014-04-01 DIAGNOSIS — H35351 Cystoid macular degeneration, right eye: Secondary | ICD-10-CM | POA: Diagnosis not present

## 2014-04-01 DIAGNOSIS — H4041X4 Glaucoma secondary to eye inflammation, right eye, indeterminate stage: Secondary | ICD-10-CM | POA: Diagnosis not present

## 2014-04-01 DIAGNOSIS — J8410 Pulmonary fibrosis, unspecified: Secondary | ICD-10-CM | POA: Diagnosis not present

## 2014-04-01 DIAGNOSIS — H3021 Posterior cyclitis, right eye: Secondary | ICD-10-CM | POA: Diagnosis not present

## 2014-04-01 DIAGNOSIS — E119 Type 2 diabetes mellitus without complications: Secondary | ICD-10-CM | POA: Diagnosis not present

## 2014-04-01 DIAGNOSIS — H4042X4 Glaucoma secondary to eye inflammation, left eye, indeterminate stage: Secondary | ICD-10-CM | POA: Diagnosis not present

## 2014-04-01 DIAGNOSIS — H4043X4 Glaucoma secondary to eye inflammation, bilateral, indeterminate stage: Secondary | ICD-10-CM | POA: Diagnosis not present

## 2014-04-09 ENCOUNTER — Other Ambulatory Visit: Payer: Self-pay | Admitting: Family Medicine

## 2014-04-09 DIAGNOSIS — E1065 Type 1 diabetes mellitus with hyperglycemia: Secondary | ICD-10-CM | POA: Diagnosis not present

## 2014-04-09 DIAGNOSIS — E039 Hypothyroidism, unspecified: Secondary | ICD-10-CM | POA: Diagnosis not present

## 2014-04-09 DIAGNOSIS — E1021 Type 1 diabetes mellitus with diabetic nephropathy: Secondary | ICD-10-CM | POA: Diagnosis not present

## 2014-04-09 DIAGNOSIS — E785 Hyperlipidemia, unspecified: Secondary | ICD-10-CM | POA: Diagnosis not present

## 2014-04-09 DIAGNOSIS — I10 Essential (primary) hypertension: Secondary | ICD-10-CM | POA: Diagnosis not present

## 2014-05-12 ENCOUNTER — Ambulatory Visit (INDEPENDENT_AMBULATORY_CARE_PROVIDER_SITE_OTHER): Payer: Medicare Other | Admitting: Family Medicine

## 2014-05-12 ENCOUNTER — Encounter: Payer: Self-pay | Admitting: Family Medicine

## 2014-05-12 VITALS — BP 138/70 | HR 78 | Wt 155.0 lb

## 2014-05-12 DIAGNOSIS — I878 Other specified disorders of veins: Secondary | ICD-10-CM | POA: Diagnosis not present

## 2014-05-12 DIAGNOSIS — R609 Edema, unspecified: Secondary | ICD-10-CM | POA: Diagnosis not present

## 2014-05-12 DIAGNOSIS — M25561 Pain in right knee: Secondary | ICD-10-CM | POA: Diagnosis not present

## 2014-05-12 DIAGNOSIS — I10 Essential (primary) hypertension: Secondary | ICD-10-CM

## 2014-05-12 MED ORDER — TRIAMCINOLONE ACETONIDE 0.1 % EX CREA: TOPICAL_CREAM | CUTANEOUS | Status: DC

## 2014-05-12 MED ORDER — VITAMIN D 1000 UNITS PO TABS: 1000.0000 [IU] | ORAL_TABLET | Freq: Every day | ORAL | Status: DC

## 2014-05-12 NOTE — Progress Notes (Signed)
CC: Carmen Cooper is a 74 y.o. female is here for Hypertension and Diabetes   Subjective: HPI:   Follow-up essential hypertension: Since I saw her last her endocrinologist removed amlodipine from her medication regimen. She reports that the swelling has improved however she still needs to take Lasix on a daily basis. She denies chest pain shortness of breath orthopnea nor edema elsewhere other than the legs. She's also noticed that there is some tenderness and dryness to the skin on the shins bilaterally. It's been present ever since the swelling began but more noticeable now. She denies any skin lesions or issues elsewhere no interventions as of yet  Complains of continued right knee pain with no benefit from meloxicam. It's occasionally swelling. Pain is localized to the inferior anterior lateral aspect of the joint. No benefit from meloxicam. Nothing particularly makes it better but it's worse going up or down stairs and it feels like her to give out at any minute. She feels like the it is very unstable. No recent injury however she has fractured about 10 years ago.  Complains of fatigue has been present the last 2 or 3 weeks. She has the motivation to get out of her house and go gardening however she feels that she just doesn't have the energy for it. She denies shortness of breath or any particular focal weakness.   Review Of Systems Outlined In HPI  Past Medical History  Diagnosis Date  . Diabetes   . Hypertension   . Thyroid disease     No past surgical history on file. Family History  Problem Relation Age of Onset  . Diabetes Neg Hx     History   Social History  . Marital Status: Married    Spouse Name: N/A  . Number of Children: N/A  . Years of Education: N/A   Occupational History  . Not on file.   Social History Main Topics  . Smoking status: Current Every Day Smoker  . Smokeless tobacco: Not on file  . Alcohol Use: Yes     Comment: 2-3 a day  . Drug Use: No  .  Sexual Activity: Not Currently   Other Topics Concern  . Not on file   Social History Narrative     Objective: BP 138/70 mmHg  Pulse 78  Wt 155 lb (70.308 kg)  Vital signs reviewed. General: Alert and Oriented, No Acute Distress HEENT: Pupils equal, round, reactive to light. Conjunctivae clear.  External ears unremarkable.  Moist mucous membranes. Lungs: Clear and comfortable work of breathing, speaking in full sentences without accessory muscle use. Cardiac: Regular rate and rhythm.  Neuro: CN II-XII grossly intact, gait normal. Extremities: No peripheral edema.  Strong peripheral pulses.  Mental Status: No depression, anxiety, nor agitation. Logical though process. Skin: Warm and dry. Mild dryness on both shins with mild flaking slightly warm to the touch  Assessment & Plan: Arthi was seen today for hypertension and diabetes.  Diagnoses and all orders for this visit:  Essential hypertension, benign  Edema  Right knee pain Orders: -     Ambulatory referral to Orthopedic Surgery  Venous stasis Orders: -     triamcinolone cream (KENALOG) 0.1 %; Apply to affected areas twice a day for up to two weeks, avoid face.  Other orders -     cholecalciferol (VITAMIN D) 1000 UNITS tablet; Take 1 tablet (1,000 Units total) by mouth daily.   Essential hypertension: Controlled continue benazepril and clonidine Edema: Continue Lasix, mild venous  stasis dermatitis therefore start triamcinolone cream for the next 2 weeks then on an as-needed basis Right knee pain: Referral to orthopedics since it sounds like she has a meniscal tear. Fatigue: Advised to start vitamin D supplementation at [phone] units every day.  25 minutes spent face-to-face during visit today of which at least 50% was counseling or coordinating care regarding: 1. Essential hypertension, benign   2. Edema   3. Right knee pain   4. Venous stasis      Return in about 3 months (around 08/12/2014) for BP.

## 2014-05-20 DIAGNOSIS — M1711 Unilateral primary osteoarthritis, right knee: Secondary | ICD-10-CM | POA: Diagnosis not present

## 2014-05-20 DIAGNOSIS — M25561 Pain in right knee: Secondary | ICD-10-CM | POA: Diagnosis not present

## 2014-05-22 DIAGNOSIS — E1065 Type 1 diabetes mellitus with hyperglycemia: Secondary | ICD-10-CM | POA: Diagnosis not present

## 2014-05-25 ENCOUNTER — Other Ambulatory Visit: Payer: Self-pay | Admitting: Family Medicine

## 2014-06-30 ENCOUNTER — Telehealth: Payer: Self-pay | Admitting: Family Medicine

## 2014-06-30 DIAGNOSIS — H35351 Cystoid macular degeneration, right eye: Secondary | ICD-10-CM | POA: Diagnosis not present

## 2014-06-30 DIAGNOSIS — H20013 Primary iridocyclitis, bilateral: Secondary | ICD-10-CM | POA: Diagnosis not present

## 2014-06-30 DIAGNOSIS — Z0000 Encounter for general adult medical examination without abnormal findings: Secondary | ICD-10-CM | POA: Diagnosis not present

## 2014-06-30 DIAGNOSIS — H20011 Primary iridocyclitis, right eye: Secondary | ICD-10-CM | POA: Diagnosis not present

## 2014-06-30 DIAGNOSIS — H20012 Primary iridocyclitis, left eye: Secondary | ICD-10-CM | POA: Diagnosis not present

## 2014-06-30 NOTE — Telephone Encounter (Signed)
Carmen Cooper, I'm not sure I understand the question since everyone has a creatine clearance? Can you clarify?

## 2014-06-30 NOTE — Telephone Encounter (Signed)
Called for clarification, they attempted to get in touch with the Dr to clarify. Questioned creatine levels, advised the last 2 we have on file were normal, inquired if they may be thinking of her microalbumin levels? Advised we may need to set up a phone call between the two physicians for discussion. Was placed on hold while they went to ask the Dr about what exactly he needs, call got disconnected.

## 2014-06-30 NOTE — Telephone Encounter (Signed)
Dr. Randell Loop (with [facility]) wants to know if the Pt has creatine clearance or if he needs to reduce her valtrex?  Office contact phone: [phone]

## 2014-06-30 NOTE — Telephone Encounter (Signed)
Spoke with Dr. Jalene Mullet office again, they just need an updated CMP. Per Dr. Ileene Rubens, it is ok to place this order. Contacted Pt and advised we are ordering a lab on her and what we were checking. Patient states she will go this week for completion. No further questions. Lab information needs to be faxed to [facility] at [phone] prior to Pt's appt on 07/14/14.

## 2014-07-01 LAB — COMPLETE METABOLIC PANEL WITH GFR
ALT: 19 U/L (ref 0–35)
AST: 22 U/L (ref 0–37)
Albumin: 4 g/dL (ref 3.5–5.2)
Alkaline Phosphatase: 63 U/L (ref 39–117)
BILIRUBIN TOTAL: 0.8 mg/dL (ref 0.2–1.2)
BUN: 17 mg/dL (ref 6–23)
CHLORIDE: 102 meq/L (ref 96–112)
CO2: 33 meq/L — AB (ref 19–32)
Calcium: 9.2 mg/dL (ref 8.4–10.5)
Creat: 0.94 mg/dL (ref 0.50–1.10)
GFR, Est African American: 69 mL/min
GFR, Est Non African American: 60 mL/min
GLUCOSE: 129 mg/dL — AB (ref 70–99)
POTASSIUM: 4.3 meq/L (ref 3.5–5.3)
SODIUM: 141 meq/L (ref 135–145)
Total Protein: 6.4 g/dL (ref 6.0–8.3)

## 2014-07-07 DIAGNOSIS — E039 Hypothyroidism, unspecified: Secondary | ICD-10-CM | POA: Diagnosis not present

## 2014-07-07 DIAGNOSIS — I10 Essential (primary) hypertension: Secondary | ICD-10-CM | POA: Diagnosis not present

## 2014-07-07 DIAGNOSIS — E1065 Type 1 diabetes mellitus with hyperglycemia: Secondary | ICD-10-CM | POA: Diagnosis not present

## 2014-07-07 DIAGNOSIS — E785 Hyperlipidemia, unspecified: Secondary | ICD-10-CM | POA: Diagnosis not present

## 2014-07-07 DIAGNOSIS — E1021 Type 1 diabetes mellitus with diabetic nephropathy: Secondary | ICD-10-CM | POA: Diagnosis not present

## 2014-07-07 DIAGNOSIS — E1069 Type 1 diabetes mellitus with other specified complication: Secondary | ICD-10-CM | POA: Diagnosis not present

## 2014-07-08 DIAGNOSIS — H04123 Dry eye syndrome of bilateral lacrimal glands: Secondary | ICD-10-CM | POA: Diagnosis not present

## 2014-07-08 DIAGNOSIS — H4043X3 Glaucoma secondary to eye inflammation, bilateral, severe stage: Secondary | ICD-10-CM | POA: Diagnosis not present

## 2014-07-08 DIAGNOSIS — E119 Type 2 diabetes mellitus without complications: Secondary | ICD-10-CM | POA: Diagnosis not present

## 2014-07-08 DIAGNOSIS — H209 Unspecified iridocyclitis: Secondary | ICD-10-CM | POA: Diagnosis not present

## 2014-07-14 DIAGNOSIS — H35351 Cystoid macular degeneration, right eye: Secondary | ICD-10-CM | POA: Diagnosis not present

## 2014-07-14 DIAGNOSIS — H3021 Posterior cyclitis, right eye: Secondary | ICD-10-CM | POA: Diagnosis not present

## 2014-07-21 ENCOUNTER — Other Ambulatory Visit: Payer: Self-pay | Admitting: Family Medicine

## 2014-08-12 ENCOUNTER — Ambulatory Visit (INDEPENDENT_AMBULATORY_CARE_PROVIDER_SITE_OTHER): Payer: Medicare Other | Admitting: Family Medicine

## 2014-08-12 ENCOUNTER — Encounter: Payer: Self-pay | Admitting: Family Medicine

## 2014-08-12 VITALS — BP 144/73 | HR 75 | Wt 160.0 lb

## 2014-08-12 DIAGNOSIS — I872 Venous insufficiency (chronic) (peripheral): Secondary | ICD-10-CM | POA: Insufficient documentation

## 2014-08-12 DIAGNOSIS — L600 Ingrowing nail: Secondary | ICD-10-CM

## 2014-08-12 DIAGNOSIS — I10 Essential (primary) hypertension: Secondary | ICD-10-CM

## 2014-08-12 DIAGNOSIS — I8310 Varicose veins of unspecified lower extremity with inflammation: Secondary | ICD-10-CM | POA: Diagnosis not present

## 2014-08-12 MED ORDER — BENAZEPRIL-HYDROCHLOROTHIAZIDE 20-12.5 MG PO TABS: 1.0000 | ORAL_TABLET | Freq: Every day | ORAL | Status: DC

## 2014-08-12 MED ORDER — CLINDAMYCIN HCL 300 MG PO CAPS: 300.0000 mg | ORAL_CAPSULE | Freq: Three times a day (TID) | ORAL | Status: DC

## 2014-08-12 NOTE — Progress Notes (Signed)
CC: Carmen Cooper is a 74 y.o. female is here for Hypertension   Subjective: HPI:  Follow-up essential hypertension: Continues on benazepril and clonidine. No outside blood pressures to report other than when seen at orthopedics and her endocrinologist office pressures have been in stage II hypertension. She denies chest pain shortness of breath orthopnea or peripheral edema. No formal physical exercise regimen.  Follow-up venous stasis dermatitis: Since using triamcinolone cream the redness on both shins has completely resolved.  Her only complaint today is an ingrown toenail on her right great toe. It's ankle to the touch but not with weightbearing. She's been picking at it with some tweezers but the pain seems to be getting worse. Symptoms of been present for about a month now on a daily basis no benefit from soaking in bleach or using triamcinolone cream. Denies fevers, chills or discharge from the site of pain.   Review Of Systems Outlined In HPI  Past Medical History  Diagnosis Date  . Diabetes   . Hypertension   . Thyroid disease     No past surgical history on file. Family History  Problem Relation Age of Onset  . Diabetes Neg Hx     History   Social History  . Marital Status: Married    Spouse Name: N/A  . Number of Children: N/A  . Years of Education: N/A   Occupational History  . Not on file.   Social History Main Topics  . Smoking status: Current Every Day Smoker  . Smokeless tobacco: Not on file  . Alcohol Use: Yes     Comment: 2-3 a day  . Drug Use: No  . Sexual Activity: Not Currently   Other Topics Concern  . Not on file   Social History Narrative     Objective: BP 144/73 mmHg  Pulse 75  Wt 160 lb (72.576 kg)  General: Alert and Oriented, No Acute Distress HEENT: Pupils equal, round, reactive to light. Conjunctivae clear.  Moist mucous membranes Lungs: Clear to auscultation bilaterally, no wheezing/ronchi/rales.  Comfortable work of breathing.  Good air movement. Cardiac: Regular rate and rhythm. Normal S1/S2.  No murmurs, rubs, nor gallops.   Extremities: No peripheral edema.  Strong peripheral pulses.  Mental Status: No depression, anxiety, nor agitation. Skin: Warm and dry. The distal medial surface of the right great toenail is digging into the skin and the skin is becoming hyperkeratotic. There is no sign of infection but it is extremely tender to the touch.  Assessment & Plan: Carmen Cooper was seen today for hypertension.  Diagnoses and all orders for this visit:  Essential hypertension, benign  Venous stasis dermatitis, unspecified laterality  Ingrown toenail Orders: -     clindamycin (CLEOCIN) 300 MG capsule; Take 1 capsule (300 mg total) by mouth 3 (three) times daily.  Other orders -     benazepril-hydrochlorthiazide (LOTENSIN HCT) 20-12.5 MG per tablet; Take 1 tablet by mouth daily.   Essential hypertension: Uncontrolled chronic condition stop benazepril and switched to benazepril-HCTZ Ingrown toenail: Begin Epsom salt soaks twice a day for 15 minutes in warm bath, given her diabetes I like her to also start on clindamycin to prevent any infection from occurring. Try not to manipulate the nail with anything. Follow-up in 10 days if not improving Venous stasis dermatitis has resolved continue as needed use of triamcinolone cream.  Return in about 3 months (around 11/12/2014).

## 2014-08-25 DIAGNOSIS — H35351 Cystoid macular degeneration, right eye: Secondary | ICD-10-CM | POA: Diagnosis not present

## 2014-08-25 DIAGNOSIS — H3021 Posterior cyclitis, right eye: Secondary | ICD-10-CM | POA: Diagnosis not present

## 2014-08-25 DIAGNOSIS — E119 Type 2 diabetes mellitus without complications: Secondary | ICD-10-CM | POA: Diagnosis not present

## 2014-08-25 DIAGNOSIS — H3022 Posterior cyclitis, left eye: Secondary | ICD-10-CM | POA: Diagnosis not present

## 2014-08-25 DIAGNOSIS — H3023 Posterior cyclitis, bilateral: Secondary | ICD-10-CM | POA: Diagnosis not present

## 2014-08-25 LAB — HM DIABETES EYE EXAM

## 2014-09-15 DIAGNOSIS — H35351 Cystoid macular degeneration, right eye: Secondary | ICD-10-CM | POA: Diagnosis not present

## 2014-09-15 DIAGNOSIS — H3021 Posterior cyclitis, right eye: Secondary | ICD-10-CM | POA: Diagnosis not present

## 2014-09-15 DIAGNOSIS — H3023 Posterior cyclitis, bilateral: Secondary | ICD-10-CM | POA: Diagnosis not present

## 2014-09-15 LAB — HM DIABETES EYE EXAM

## 2014-10-07 DIAGNOSIS — E1065 Type 1 diabetes mellitus with hyperglycemia: Secondary | ICD-10-CM | POA: Diagnosis not present

## 2014-10-07 DIAGNOSIS — E785 Hyperlipidemia, unspecified: Secondary | ICD-10-CM | POA: Diagnosis not present

## 2014-10-07 DIAGNOSIS — E1040 Type 1 diabetes mellitus with diabetic neuropathy, unspecified: Secondary | ICD-10-CM | POA: Diagnosis not present

## 2014-10-07 DIAGNOSIS — I10 Essential (primary) hypertension: Secondary | ICD-10-CM | POA: Diagnosis not present

## 2014-10-07 DIAGNOSIS — E1021 Type 1 diabetes mellitus with diabetic nephropathy: Secondary | ICD-10-CM | POA: Diagnosis not present

## 2014-10-07 DIAGNOSIS — K580 Irritable bowel syndrome with diarrhea: Secondary | ICD-10-CM | POA: Diagnosis not present

## 2014-10-07 DIAGNOSIS — E039 Hypothyroidism, unspecified: Secondary | ICD-10-CM | POA: Diagnosis not present

## 2014-10-07 DIAGNOSIS — E1069 Type 1 diabetes mellitus with other specified complication: Secondary | ICD-10-CM | POA: Diagnosis not present

## 2014-10-14 DIAGNOSIS — H4043X3 Glaucoma secondary to eye inflammation, bilateral, severe stage: Secondary | ICD-10-CM | POA: Diagnosis not present

## 2014-10-14 DIAGNOSIS — H04123 Dry eye syndrome of bilateral lacrimal glands: Secondary | ICD-10-CM | POA: Diagnosis not present

## 2014-10-14 DIAGNOSIS — H209 Unspecified iridocyclitis: Secondary | ICD-10-CM | POA: Diagnosis not present

## 2014-10-14 DIAGNOSIS — E119 Type 2 diabetes mellitus without complications: Secondary | ICD-10-CM | POA: Diagnosis not present

## 2014-10-21 DIAGNOSIS — K580 Irritable bowel syndrome with diarrhea: Secondary | ICD-10-CM | POA: Diagnosis not present

## 2014-10-21 DIAGNOSIS — E1040 Type 1 diabetes mellitus with diabetic neuropathy, unspecified: Secondary | ICD-10-CM | POA: Diagnosis not present

## 2014-10-21 DIAGNOSIS — E1065 Type 1 diabetes mellitus with hyperglycemia: Secondary | ICD-10-CM | POA: Diagnosis not present

## 2014-10-27 DIAGNOSIS — H35351 Cystoid macular degeneration, right eye: Secondary | ICD-10-CM | POA: Diagnosis not present

## 2014-11-11 ENCOUNTER — Encounter: Payer: Self-pay | Admitting: Family Medicine

## 2014-11-11 ENCOUNTER — Ambulatory Visit: Payer: Self-pay | Admitting: Family Medicine

## 2014-11-11 NOTE — Progress Notes (Signed)
No show 11/11/2014

## 2014-11-12 ENCOUNTER — Encounter: Payer: Self-pay | Admitting: Osteopathic Medicine

## 2014-11-12 ENCOUNTER — Ambulatory Visit (INDEPENDENT_AMBULATORY_CARE_PROVIDER_SITE_OTHER): Payer: Medicare Other | Admitting: Osteopathic Medicine

## 2014-11-12 VITALS — BP 118/69 | HR 73 | Ht 65.0 in | Wt 157.0 lb

## 2014-11-12 DIAGNOSIS — L237 Allergic contact dermatitis due to plants, except food: Secondary | ICD-10-CM | POA: Diagnosis not present

## 2014-11-12 MED ORDER — METHYLPREDNISOLONE SODIUM SUCC 125 MG IJ SOLR
125.0000 mg | Freq: Once | INTRAMUSCULAR | Status: AC
  Administered 2014-11-12: 125 mg via INTRAMUSCULAR

## 2014-11-12 MED ORDER — PREDNISONE 10 MG (21) PO TBPK: ORAL_TABLET | ORAL | Status: DC

## 2014-11-12 MED ORDER — PREDNISONE 10 MG (48) PO TBPK: ORAL_TABLET | Freq: Every day | ORAL | Status: DC

## 2014-11-12 NOTE — Progress Notes (Signed)
HPI: Carmen Cooper is a 74 y.o. female who presents to [facility]  today for chief complaint of:  Chief Complaint  Patient presents with  . Poison Ivy    face, neck, hands-2 days   . Location: face, arms . Quality: itching  . Severity: moderate to severe . Duration: 2 days Context: pulling bushes in yard recently and rash appeared   Past medical, social and family history reviewed: Past Medical History  Diagnosis Date  . Diabetes   . Hypertension   . Thyroid disease    No past surgical history on file. Social History  Substance Use Topics  . Smoking status: Current Every Day Smoker  . Smokeless tobacco: Not on file  . Alcohol Use: Yes     Comment: 2-3 a day   Family History  Problem Relation Age of Onset  . Diabetes Neg Hx     Current Outpatient Prescriptions  Medication Sig Dispense Refill  . alendronate (FOSAMAX) 70 MG tablet Take 1 tablet (70 mg total) by mouth every 7 (seven) days. Take with a full glass of water on an empty stomach. 18 tablet 3  . AMBULATORY NON FORMULARY MEDICATION Freestyle light test strips Test twice a day  Dx type 2 diabetes E11.9 100 each 11  . aspirin EC 325 MG tablet Take 1 tablet (325 mg total) by mouth daily. 30 tablet 0  . atorvastatin (LIPITOR) 40 MG tablet TAKE 1 TABLET DAILY 90 tablet 0  . benazepril-hydrochlorthiazide (LOTENSIN HCT) 20-12.5 MG per tablet Take 1 tablet by mouth daily. 90 tablet 1  . cholecalciferol (VITAMIN D) 1000 UNITS tablet Take 1 tablet (1,000 Units total) by mouth daily.    . clindamycin (CLEOCIN) 300 MG capsule Take 1 capsule (300 mg total) by mouth 3 (three) times daily. 30 capsule 0  . cloNIDine (CATAPRES) 0.2 MG tablet Take 0.5 tablets (0.1 mg total) by mouth daily. 90 tablet 1  . furosemide (LASIX) 20 MG tablet One by mouth one to two times a day as needed for swelling in the legs. 60 tablet 3  . Insulin Glargine (LANTUS SOLOSTAR) 100 UNIT/ML Solostar Pen Inject 20 Units  into the skin at bedtime. 5 pen PRN  . insulin lispro (HUMALOG) 100 UNIT/ML KiwkPen 6 Units SQ with meals.  If glucose 150-200 add 1 unit, 201-250 add 2 units, etc. 15 mL 5  . levothyroxine (SYNTHROID, LEVOTHROID) 112 MCG tablet TAKE 1 TABLET DAILY 90 tablet 0  . meloxicam (MOBIC) 15 MG tablet Take 1 tablet (15 mg total) by mouth daily. To prevent knee pain. 30 tablet 0  . triamcinolone cream (KENALOG) 0.1 % Apply to affected areas twice a day for up to two weeks, avoid face. 80 g 0  . predniSONE (STERAPRED UNI-PAK 21 TAB) 10 MG (21) TBPK tablet 12 day taper pack, use as directed 1 tablet 0   No current facility-administered medications for this visit.   No Known Allergies    Review of Systems: CONSTITUTIONAL: Neg fever/chills, no unintentional weight changes HEAD/EYES/EARS/NOSE/THROAT: No headache/vision change or hearing change, no sore throat SKIN: (+) rash/wounds/concerning lesions - poison ivy as per HPI   Exam:  BP 118/69 mmHg  Pulse 73  Ht 5\' 5"  (1.651 m)  Wt 157 lb (71.215 kg)  BMI 26.13 kg/m2 Constitutional: VSS, see above. General Appearance: alert, well-developed, well-nourished, NAD HENT: EOMI Skin: Vescicular, urticarial rash L temple area and L and R forearm, clear/yellow crusting/drainage, no purulence,   No results found for  this or any previous visit (from the past 72 hour(s)).    ASSESSMENT/PLAN:  Poison ivy dermatitis - Plan: predniSONE (STERAPRED UNI-PAK 21 TAB) 10 MG (21) TBPK tablet, methylPREDNISolone sodium succinate (SOLU-MEDROL) 125 mg/2 mL injection 125 mg   RTC if worse or if vision problems or nay other concerns.

## 2014-11-12 NOTE — Addendum Note (Signed)
Addended by: Maryla Morrow on: 11/12/2014 12:10 PM   Modules accepted: Orders

## 2014-11-12 NOTE — Patient Instructions (Signed)
Start steroid pack tomorrow since you were given an injection today.  Return to clinic if any concerns, if the rash gets worse.  Ok to use Calamine lotion, oatmeal baths to ease swelling.    Poison [employer] ivy is a inflammation of the skin (contact dermatitis) caused by touching the allergens on the leaves of the ivy plant following previous exposure to the plant. The rash usually appears 48 hours after exposure. The rash is usually bumps (papules) or blisters (vesicles) in a linear pattern. Depending on your own sensitivity, the rash may simply cause redness and itching, or it may also progress to blisters which may break open. These must be well cared for to prevent secondary bacterial (germ) infection, followed by scarring. Keep any open areas dry, clean, dressed, and covered with an antibacterial ointment if needed. The eyes may also get puffy. The puffiness is worst in the morning and gets better as the day progresses. This dermatitis usually heals without scarring, within 2 to 3 weeks without treatment. HOME CARE INSTRUCTIONS  Thoroughly wash with soap and water as soon as you have been exposed to poison ivy. You have about one half hour to remove the plant resin before it will cause the rash. This washing will destroy the oil or antigen on the skin that is causing, or will cause, the rash. Be sure to wash under your fingernails as any plant resin there will continue to spread the rash. Do not rub skin vigorously when washing affected area. Poison ivy cannot spread if no oil from the plant remains on your body. A rash that has progressed to weeping sores will not spread the rash unless you have not washed thoroughly. It is also important to wash any clothes you have been wearing as these may carry active allergens. The rash will return if you wear the unwashed clothing, even several days later. Avoidance of the plant in the future is the best measure. Poison ivy plant can be recognized by the  number of leaves. Generally, poison ivy has three leaves with flowering branches on a single stem. Diphenhydramine may be purchased over the counter and used as needed for itching. Do not drive with this medication if it makes you drowsy.Ask your caregiver about medication for children. SEEK MEDICAL CARE IF:  Open sores develop.  Redness spreads beyond area of rash.  You notice purulent (pus-like) discharge.  You have increased pain.  Other signs of infection develop (such as fever). Document Released: 01/28/2000 Document Revised: 04/24/2011 Document Reviewed: 07/10/2008 [facility] Patient Information 2015 [place], [place]. This information is not intended to replace advice given to you by your health care provider. Make sure you discuss any questions you have with your health care provider.

## 2014-11-18 DIAGNOSIS — E1065 Type 1 diabetes mellitus with hyperglycemia: Secondary | ICD-10-CM | POA: Diagnosis not present

## 2014-11-18 DIAGNOSIS — E785 Hyperlipidemia, unspecified: Secondary | ICD-10-CM | POA: Diagnosis not present

## 2014-11-18 DIAGNOSIS — Z23 Encounter for immunization: Secondary | ICD-10-CM | POA: Diagnosis not present

## 2014-11-18 DIAGNOSIS — K580 Irritable bowel syndrome with diarrhea: Secondary | ICD-10-CM | POA: Diagnosis not present

## 2014-11-18 DIAGNOSIS — E1069 Type 1 diabetes mellitus with other specified complication: Secondary | ICD-10-CM | POA: Diagnosis not present

## 2014-11-18 DIAGNOSIS — E039 Hypothyroidism, unspecified: Secondary | ICD-10-CM | POA: Diagnosis not present

## 2014-11-18 DIAGNOSIS — I10 Essential (primary) hypertension: Secondary | ICD-10-CM | POA: Diagnosis not present

## 2014-11-18 DIAGNOSIS — E1040 Type 1 diabetes mellitus with diabetic neuropathy, unspecified: Secondary | ICD-10-CM | POA: Diagnosis not present

## 2014-12-11 ENCOUNTER — Encounter: Payer: Self-pay | Admitting: Family Medicine

## 2014-12-11 ENCOUNTER — Other Ambulatory Visit: Payer: Self-pay | Admitting: Family Medicine

## 2014-12-15 ENCOUNTER — Telehealth: Payer: Self-pay

## 2014-12-15 NOTE — Telephone Encounter (Signed)
Please return a call to the patient. She left a message stating she didn't miss an appointment but received a no show bill.

## 2014-12-25 DIAGNOSIS — E119 Type 2 diabetes mellitus without complications: Secondary | ICD-10-CM | POA: Diagnosis not present

## 2014-12-25 DIAGNOSIS — Z794 Long term (current) use of insulin: Secondary | ICD-10-CM | POA: Diagnosis not present

## 2014-12-25 LAB — BASIC METABOLIC PANEL
BUN: 24 mg/dL — AB (ref 4–21)
Creatinine: 1.1 mg/dL (ref 0.5–1.1)
SODIUM: 144 mmol/L (ref 137–147)

## 2014-12-25 LAB — LIPID PANEL
Cholesterol: 168 mg/dL (ref 0–200)
HDL: 99 mg/dL — AB (ref 35–70)
LDL CALC: 55 mg/dL
TRIGLYCERIDES: 70 mg/dL (ref 40–160)

## 2014-12-25 LAB — TSH: TSH: 2.76 u[IU]/mL (ref 0.41–5.90)

## 2014-12-30 DIAGNOSIS — E1069 Type 1 diabetes mellitus with other specified complication: Secondary | ICD-10-CM | POA: Diagnosis not present

## 2014-12-30 DIAGNOSIS — E785 Hyperlipidemia, unspecified: Secondary | ICD-10-CM | POA: Diagnosis not present

## 2014-12-30 DIAGNOSIS — R809 Proteinuria, unspecified: Secondary | ICD-10-CM | POA: Diagnosis not present

## 2014-12-30 DIAGNOSIS — E1065 Type 1 diabetes mellitus with hyperglycemia: Secondary | ICD-10-CM | POA: Diagnosis not present

## 2014-12-30 DIAGNOSIS — E1049 Type 1 diabetes mellitus with other diabetic neurological complication: Secondary | ICD-10-CM | POA: Diagnosis not present

## 2014-12-30 DIAGNOSIS — E1029 Type 1 diabetes mellitus with other diabetic kidney complication: Secondary | ICD-10-CM | POA: Diagnosis not present

## 2014-12-30 DIAGNOSIS — E039 Hypothyroidism, unspecified: Secondary | ICD-10-CM | POA: Diagnosis not present

## 2014-12-30 DIAGNOSIS — I10 Essential (primary) hypertension: Secondary | ICD-10-CM | POA: Diagnosis not present

## 2015-01-04 DIAGNOSIS — H35351 Cystoid macular degeneration, right eye: Secondary | ICD-10-CM | POA: Diagnosis not present

## 2015-01-18 ENCOUNTER — Encounter: Payer: Self-pay | Admitting: Family Medicine

## 2015-01-18 ENCOUNTER — Ambulatory Visit (INDEPENDENT_AMBULATORY_CARE_PROVIDER_SITE_OTHER): Payer: Medicare Other | Admitting: Family Medicine

## 2015-01-18 VITALS — BP 105/70 | HR 63 | Wt 159.0 lb

## 2015-01-18 DIAGNOSIS — J029 Acute pharyngitis, unspecified: Secondary | ICD-10-CM | POA: Diagnosis not present

## 2015-01-18 DIAGNOSIS — I10 Essential (primary) hypertension: Secondary | ICD-10-CM

## 2015-01-18 MED ORDER — BENAZEPRIL-HYDROCHLOROTHIAZIDE 20-12.5 MG PO TABS: 1.0000 | ORAL_TABLET | Freq: Every day | ORAL | Status: DC

## 2015-01-18 NOTE — Progress Notes (Signed)
CC: Carmen Cooper is a 74 y.o. female is here for Hypertension; Cough; and Sore Throat   Subjective: HPI:  Mild sore throat with mild sensation of mucus in the back of the throat is present every night when trying to go to bed. It's also present for a few minutes when she wakes up in the morning. It's been going on for 2-3 months on a nightly basis. Nothing seems to make it better or worse. No interventions as of yet. Denies wheezing, shortness of breath, fevers, chills or difficulty swallowing.  Follow-up essential hypertension: Continues to take benazepril/hydrochlorothiazide on a daily basis without any known side effects. Outside blood pressures are also below 140/90. Denies chest pain, shortness of breath, orthopnea nor peripheral edema. No cramping. She had renal function checked last month and it was normal.  Review Of Systems Outlined In HPI  Past Medical History  Diagnosis Date  . Diabetes ([place])   . Hypertension   . Thyroid disease     No past surgical history on file. Family History  Problem Relation Age of Onset  . Diabetes Neg Hx     Social History   Social History  . Marital Status: Married    Spouse Name: N/A  . Number of Children: N/A  . Years of Education: N/A   Occupational History  . Not on file.   Social History Main Topics  . Smoking status: Current Every Day Smoker  . Smokeless tobacco: Not on file  . Alcohol Use: Yes     Comment: 2-3 a day  . Drug Use: No  . Sexual Activity: Not Currently   Other Topics Concern  . Not on file   Social History Narrative     Objective: BP 105/70 mmHg  Pulse 63  Wt 159 lb (72.122 kg)  General: Alert and Oriented, No Acute Distress HEENT: Pupils equal, round, reactive to light. Conjunctivae clear.  External ears unremarkable, canals clear with intact TMs with appropriate landmarks.  Middle ear appears open without effusion. Pink inferior turbinates.  Moist mucous membranes, pharynx without inflammation nor  lesions.  Neck supple without palpable lymphadenopathy nor abnormal masses. Lungs: Clear to auscultation bilaterally, no wheezing/ronchi/rales.  Comfortable work of breathing. Good air movement. Cardiac: Regular rate and rhythm. Normal S1/S2.  No murmurs, rubs, nor gallops.   Extremities: No peripheral edema.  Strong peripheral pulses.  Mental Status: No depression, anxiety, nor agitation. Skin: Warm and dry.  Assessment & Plan: Carmen Cooper was seen today for hypertension, cough and sore throat.  Diagnoses and all orders for this visit:  Essential hypertension, benign  Sore throat  Other orders -     benazepril-hydrochlorthiazide (LOTENSIN HCT) 20-12.5 MG tablet; Take 1 tablet by mouth daily.   Essential hypertension: Controlled continue benazepril/hydrochlorothiazide Sore throat most likely due to postnasal drip due to allergens, she believes that taking a daily antihistamine would be more troublesome than just dealing with the symptoms at hand. I've encouraged her to think about starting an over-the-counter antihistamine if symptoms begin to bother her quality of life.  Return in about 3 months (around 04/18/2015) for Blood Pressure.

## 2015-01-26 ENCOUNTER — Encounter: Payer: Self-pay | Admitting: Family Medicine

## 2015-01-26 DIAGNOSIS — H209 Unspecified iridocyclitis: Secondary | ICD-10-CM | POA: Insufficient documentation

## 2015-02-09 DIAGNOSIS — H209 Unspecified iridocyclitis: Secondary | ICD-10-CM | POA: Diagnosis not present

## 2015-02-09 DIAGNOSIS — H4043X3 Glaucoma secondary to eye inflammation, bilateral, severe stage: Secondary | ICD-10-CM | POA: Diagnosis not present

## 2015-02-10 DIAGNOSIS — E1069 Type 1 diabetes mellitus with other specified complication: Secondary | ICD-10-CM | POA: Diagnosis not present

## 2015-02-10 DIAGNOSIS — E1029 Type 1 diabetes mellitus with other diabetic kidney complication: Secondary | ICD-10-CM | POA: Diagnosis not present

## 2015-02-10 DIAGNOSIS — E039 Hypothyroidism, unspecified: Secondary | ICD-10-CM | POA: Diagnosis not present

## 2015-02-10 DIAGNOSIS — E785 Hyperlipidemia, unspecified: Secondary | ICD-10-CM | POA: Diagnosis not present

## 2015-02-10 DIAGNOSIS — E1049 Type 1 diabetes mellitus with other diabetic neurological complication: Secondary | ICD-10-CM | POA: Diagnosis not present

## 2015-02-10 DIAGNOSIS — I10 Essential (primary) hypertension: Secondary | ICD-10-CM | POA: Diagnosis not present

## 2015-02-10 DIAGNOSIS — E1065 Type 1 diabetes mellitus with hyperglycemia: Secondary | ICD-10-CM | POA: Diagnosis not present

## 2015-02-10 DIAGNOSIS — R809 Proteinuria, unspecified: Secondary | ICD-10-CM | POA: Diagnosis not present

## 2015-02-15 DIAGNOSIS — H43391 Other vitreous opacities, right eye: Secondary | ICD-10-CM | POA: Diagnosis not present

## 2015-03-17 ENCOUNTER — Encounter: Payer: Self-pay | Admitting: Family Medicine

## 2015-03-23 DIAGNOSIS — Z961 Presence of intraocular lens: Secondary | ICD-10-CM | POA: Diagnosis not present

## 2015-03-23 DIAGNOSIS — H4043X3 Glaucoma secondary to eye inflammation, bilateral, severe stage: Secondary | ICD-10-CM | POA: Diagnosis not present

## 2015-03-23 DIAGNOSIS — H26493 Other secondary cataract, bilateral: Secondary | ICD-10-CM | POA: Diagnosis not present

## 2015-03-23 DIAGNOSIS — H209 Unspecified iridocyclitis: Secondary | ICD-10-CM | POA: Diagnosis not present

## 2015-03-23 DIAGNOSIS — E109 Type 1 diabetes mellitus without complications: Secondary | ICD-10-CM | POA: Diagnosis not present

## 2015-03-31 DIAGNOSIS — H2011 Chronic iridocyclitis, right eye: Secondary | ICD-10-CM | POA: Diagnosis not present

## 2015-03-31 DIAGNOSIS — H2012 Chronic iridocyclitis, left eye: Secondary | ICD-10-CM | POA: Diagnosis not present

## 2015-03-31 DIAGNOSIS — H2013 Chronic iridocyclitis, bilateral: Secondary | ICD-10-CM | POA: Diagnosis not present

## 2015-03-31 DIAGNOSIS — H35351 Cystoid macular degeneration, right eye: Secondary | ICD-10-CM | POA: Diagnosis not present

## 2015-04-02 ENCOUNTER — Other Ambulatory Visit: Payer: Self-pay | Admitting: Family Medicine

## 2015-04-02 NOTE — Telephone Encounter (Signed)
This medication is currrently prescribed by Dr. Hartford Poli at [facility] and the request should be directed to him.

## 2015-04-02 NOTE — Telephone Encounter (Signed)
Please advise.  This medication has not been filled since Oct. of 2015.

## 2015-04-05 ENCOUNTER — Other Ambulatory Visit: Payer: Self-pay

## 2015-04-05 MED ORDER — INSULIN LISPRO 100 UNIT/ML (KWIKPEN): PEN_INJECTOR | SUBCUTANEOUS | Status: DC

## 2015-04-06 DIAGNOSIS — E039 Hypothyroidism, unspecified: Secondary | ICD-10-CM | POA: Diagnosis not present

## 2015-04-28 DIAGNOSIS — H35351 Cystoid macular degeneration, right eye: Secondary | ICD-10-CM | POA: Diagnosis not present

## 2015-05-11 DIAGNOSIS — I10 Essential (primary) hypertension: Secondary | ICD-10-CM | POA: Diagnosis not present

## 2015-05-11 DIAGNOSIS — E1065 Type 1 diabetes mellitus with hyperglycemia: Secondary | ICD-10-CM | POA: Diagnosis not present

## 2015-05-11 DIAGNOSIS — E1021 Type 1 diabetes mellitus with diabetic nephropathy: Secondary | ICD-10-CM | POA: Diagnosis not present

## 2015-05-11 DIAGNOSIS — E785 Hyperlipidemia, unspecified: Secondary | ICD-10-CM | POA: Diagnosis not present

## 2015-05-11 DIAGNOSIS — E039 Hypothyroidism, unspecified: Secondary | ICD-10-CM | POA: Diagnosis not present

## 2015-05-11 DIAGNOSIS — E1069 Type 1 diabetes mellitus with other specified complication: Secondary | ICD-10-CM | POA: Diagnosis not present

## 2015-05-11 DIAGNOSIS — E1049 Type 1 diabetes mellitus with other diabetic neurological complication: Secondary | ICD-10-CM | POA: Diagnosis not present

## 2015-06-26 DIAGNOSIS — L237 Allergic contact dermatitis due to plants, except food: Secondary | ICD-10-CM | POA: Diagnosis not present

## 2015-07-19 ENCOUNTER — Ambulatory Visit (INDEPENDENT_AMBULATORY_CARE_PROVIDER_SITE_OTHER): Payer: Medicare Other | Admitting: Family Medicine

## 2015-07-19 ENCOUNTER — Encounter: Payer: Self-pay | Admitting: Family Medicine

## 2015-07-19 VITALS — BP 127/73 | HR 60 | Wt 155.0 lb

## 2015-07-19 DIAGNOSIS — N183 Chronic kidney disease, stage 3 unspecified: Secondary | ICD-10-CM

## 2015-07-19 DIAGNOSIS — Z794 Long term (current) use of insulin: Secondary | ICD-10-CM | POA: Diagnosis not present

## 2015-07-19 DIAGNOSIS — I10 Essential (primary) hypertension: Secondary | ICD-10-CM

## 2015-07-19 DIAGNOSIS — E1122 Type 2 diabetes mellitus with diabetic chronic kidney disease: Secondary | ICD-10-CM | POA: Diagnosis not present

## 2015-07-19 MED ORDER — BENAZEPRIL-HYDROCHLOROTHIAZIDE 20-12.5 MG PO TABS: 1.0000 | ORAL_TABLET | Freq: Every day | ORAL | Status: DC

## 2015-07-19 MED ORDER — ATORVASTATIN CALCIUM 40 MG PO TABS: 40.0000 mg | ORAL_TABLET | Freq: Every day | ORAL | Status: DC

## 2015-07-19 NOTE — Progress Notes (Signed)
CC: Carmen Cooper is a 75 y.o. female is here for Hypertension   Subjective: HPI:  Follow essential hypertension: She is taking benazepril/hydrochlorothiazide on a daily basis. No blood pressures from home however pressures with Dr. Oren Binet are normal. Denies chest pain shortness of breath orthopnea nor peripheral edema. She spends most days outside gardening without any discomfort. She denies any lightheadedness or any motor or sensory disturbances  Follow-up type 2 diabetes: She tried using a insulin pump however it was complicated and she got frustrated with it so she decided to stick with injections throughout the day. Blood sugars are fluctuating but she she is making some progress with Dr. Oren Binet.   Review Of Systems Outlined In HPI  Past Medical History  Diagnosis Date  . Diabetes ([place])   . Hypertension   . Thyroid disease     No past surgical history on file. Family History  Problem Relation Age of Onset  . Diabetes Neg Hx     Social History   Social History  . Marital Status: Married    Spouse Name: N/A  . Number of Children: N/A  . Years of Education: N/A   Occupational History  . Not on file.   Social History Main Topics  . Smoking status: Current Every Day Smoker  . Smokeless tobacco: Not on file  . Alcohol Use: Yes     Comment: 2-3 a day  . Drug Use: No  . Sexual Activity: Not Currently   Other Topics Concern  . Not on file   Social History Narrative     Objective: BP 127/73 mmHg  Pulse 60  Wt 155 lb (70.308 kg)  General: Alert and Oriented, No Acute Distress HEENT: Pupils equal, round, reactive to light. Conjunctivae clear. Moist mucous membranes Lungs: Clear to auscultation bilaterally, no wheezing/ronchi/rales.  Comfortable work of breathing. Good air movement. Cardiac: Regular rate and rhythm. Normal S1/S2.  No murmurs, rubs, nor gallops.   Extremities: No peripheral edema.  Strong peripheral pulses.  Mental Status: No depression, anxiety, nor  agitation. Skin: Warm and dry.  Assessment & Plan: Radine was seen today for hypertension.  Diagnoses and all orders for this visit:  Essential hypertension, benign  Type 2 diabetes mellitus with stage 3 chronic kidney disease, with long-term current use of insulin (HCC)  Other orders -     atorvastatin (LIPITOR) 40 MG tablet; Take 1 tablet (40 mg total) by mouth daily. -     benazepril-hydrochlorthiazide (LOTENSIN HCT) 20-12.5 MG tablet; Take 1 tablet by mouth daily.   Essential hypertension: Controlled continue benazepril hydrochlorothiazide Type 2 diabetes: Continue management with Dr. Oren Binet, she needs a refill on her statin, I've asked her to come back in a fasting state to recheck LDL cholesterol sometime in the next 3 months.   25 minutes spent face-to-face during visit today of which at least 50% was counseling or coordinating care regarding: 1. Essential hypertension, benign   2. Type 2 diabetes mellitus with stage 3 chronic kidney disease, with long-term current use of insulin ([place])      Return in about 3 months (around 10/19/2015) for Cholesterol.

## 2015-07-26 ENCOUNTER — Encounter: Payer: Self-pay | Admitting: *Deleted

## 2015-07-26 ENCOUNTER — Emergency Department (INDEPENDENT_AMBULATORY_CARE_PROVIDER_SITE_OTHER)
Admission: EM | Admit: 2015-07-26 | Discharge: 2015-07-26 | Disposition: A | Discharge status: Home / Self Care | Payer: Medicare Other | Source: Home / Self Care | Attending: Family Medicine | Admitting: Family Medicine

## 2015-07-26 DIAGNOSIS — Z23 Encounter for immunization: Secondary | ICD-10-CM | POA: Diagnosis not present

## 2015-07-26 DIAGNOSIS — S51802A Unspecified open wound of left forearm, initial encounter: Secondary | ICD-10-CM

## 2015-07-26 DIAGNOSIS — Z8679 Personal history of other diseases of the circulatory system: Secondary | ICD-10-CM

## 2015-07-26 DIAGNOSIS — S51812A Laceration without foreign body of left forearm, initial encounter: Secondary | ICD-10-CM

## 2015-07-26 MED ORDER — CEPHALEXIN 500 MG PO CAPS: 500.0000 mg | ORAL_CAPSULE | Freq: Two times a day (BID) | ORAL | Status: DC

## 2015-07-26 MED ORDER — TETANUS-DIPHTH-ACELL PERTUSSIS 5-2.5-18.5 LF-MCG/0.5 IM SUSP
0.5000 mL | Freq: Once | INTRAMUSCULAR | Status: AC
  Administered 2015-07-26: 0.5 mL via INTRAMUSCULAR

## 2015-07-26 MED ORDER — LIDOCAINE-EPINEPHRINE-TETRACAINE (LET) SOLUTION
3.0000 mL | Freq: Once | NASAL | Status: AC
  Administered 2015-07-26: 3 mL via TOPICAL

## 2015-07-26 MED ORDER — ACETAMINOPHEN 325 MG PO TABS
650.0000 mg | ORAL_TABLET | Freq: Once | ORAL | Status: AC
  Administered 2015-07-26: 650 mg via ORAL

## 2015-07-26 NOTE — ED Notes (Signed)
Pt reports while hanging a basket today it fell, hitting her in the head then hitting her LFA. She believes she is not UTD on tetanus vaccine. She has not cleaned the site. Site is not bleeding, cleaned and irrigated with sterile water and Hibiclens. Declined anything for pain.

## 2015-07-26 NOTE — Discharge Instructions (Signed)
Please take antibiotics as prescribed to help prevent infection.  If you develop symptoms including rash, difficulty breathing, or more than 2-3 episodes of vomiting and diarrhea, please stop the medication and call our office so another antibiotic can be called in, or have you come in for a wound recheck.

## 2015-07-26 NOTE — ED Provider Notes (Signed)
CSN: [id number]     Arrival date & time 07/26/15  1313 History   First MD Initiated Contact with Patient 07/26/15 1323     Chief Complaint  Patient presents with  . Laceration   (Consider location/radiation/quality/duration/timing/severity/associated sxs/prior Treatment) HPI  Carmen Cooper is a 75 y.o. female presenting to UC with c/o Left forearm pain that started suddenly just PTA after a potted plant fell and hit her arm, resulting in a skin tear. Pt notes the pot did not crack or shatter on her arm.  No pain medication taken PTA. Pt is on daily aspirin 325mg , but has not taken today.  Bleeding controlled PTA.  Pain is minimal at this time. Last Tetanus unknown. No other injuries.   BP elevated in triage- 208/72.  Pt states she did take her Clonidine this morning and recently had a f/u with her PCP. Denies chest pain, shortness of breath, headache, change in vision, dizziness, or lightheadedness.  Past Medical History  Diagnosis Date  . Diabetes ([place])   . Hypertension   . Thyroid disease    History reviewed. No pertinent past surgical history. Family History  Problem Relation Age of Onset  . Diabetes Neg Hx    Social History  Substance Use Topics  . Smoking status: Current Every Day Smoker  . Smokeless tobacco: None  . Alcohol Use: Yes     Comment: 2-3 a day   OB History    No data available     Review of Systems  Musculoskeletal: Negative for myalgias and arthralgias.  Skin: Positive for wound. Negative for color change.  Neurological: Negative for weakness and numbness.    Allergies  Clindamycin/lincomycin and Valacyclovir hcl  Home Medications   Prior to Admission medications   Medication Sig Start Date End Date Taking? Authorizing Provider  alendronate (FOSAMAX) 70 MG tablet Take 1 tablet (70 mg total) by mouth every 7 (seven) days. Take with a full glass of water on an empty stomach. Patient not taking: Reported on 07/19/2015 09/11/13   Marcial Pacas, DO  AMBULATORY  NON FORMULARY MEDICATION Freestyle light test strips Test twice a day  Dx type 2 diabetes E11.9 01/14/14   Marcial Pacas, DO  aspirin EC 325 MG tablet Take 1 tablet (325 mg total) by mouth daily. 08/11/13   Sean Hommel, DO  atorvastatin (LIPITOR) 40 MG tablet Take 1 tablet (40 mg total) by mouth daily. 07/19/15   Sean Hommel, DO  benazepril-hydrochlorthiazide (LOTENSIN HCT) 20-12.5 MG tablet Take 1 tablet by mouth daily. 07/19/15   Sean Hommel, DO  cephALEXin (KEFLEX) 500 MG capsule Take 1 capsule (500 mg total) by mouth 2 (two) times daily. For 7 days 07/26/15   Noland Fordyce, PA-C  cloNIDine (CATAPRES) 0.2 MG tablet Take 0.5 tablets (0.1 mg total) by mouth daily. 01/14/14   Marcial Pacas, DO  Insulin Glargine (LANTUS SOLOSTAR) 100 UNIT/ML Solostar Pen Inject 20 Units into the skin at bedtime. 11/27/13   Sean Hommel, DO  insulin lispro (HUMALOG) 100 UNIT/ML KiwkPen 6 Units SQ with meals.  If glucose 150-200 add 1 unit, 201-250 add 2 units, etc. 04/05/15   Marcial Pacas, DO  levothyroxine (SYNTHROID, LEVOTHROID) 112 MCG tablet TAKE 1 TABLET DAILY 07/21/14   Marcial Pacas, DO   Meds Ordered and Administered this Visit   Medications  acetaminophen (TYLENOL) tablet 650 mg (not administered)  lidocaine-EPINEPHrine-tetracaine (LET) solution (3 mLs Topical Given 07/26/15 1352)  Tdap (BOOSTRIX) injection 0.5 mL (0.5 mLs Intramuscular Given 07/26/15 1353)  BP 208/72 mmHg  Pulse 75  Temp(Src) 98.2 F (36.8 C) (Oral)  Ht 5\' 5"  (1.651 m)  Wt 153 lb (69.4 kg)  BMI 25.46 kg/m2  SpO2 99% No data found.   Physical Exam  Constitutional: She is oriented to person, place, and time. She appears well-developed and well-nourished.  HENT:  Head: Normocephalic and atraumatic.  Eyes: EOM are normal.  Neck: Normal range of motion.  Cardiovascular: Normal rate.   Pulmonary/Chest: Effort normal.  Musculoskeletal: Normal range of motion.  Neurological: She is alert and oriented to person, place, and time.  Skin: Skin is  warm and dry.  Left forearm, dorsal aspect: 4-5cm superficial skin tear with 3 small black pieces of debris (dirt?). No active bleeding.   Psychiatric: She has a normal mood and affect. Her behavior is normal.  Nursing note and vitals reviewed.   ED Course  .Marland KitchenLaceration Repair Date/Time: 07/26/2015 2:36 PM Performed by: Noland Fordyce Authorized by: Theone Murdoch A Consent: Verbal consent obtained. Risks and benefits: risks, benefits and alternatives were discussed Consent given by: patient Patient understanding: patient states understanding of the procedure being performed Patient consent: the patient's understanding of the procedure matches consent given Required items: required blood products, implants, devices, and special equipment available Patient identity confirmed: verbally with patient Body area: upper extremity Location details: left lower arm Laceration length: 4.5 cm Contamination: The wound is contaminated. Foreign bodies: unknown (dirt?) Tendon involvement: none Nerve involvement: none Vascular damage: no Anesthesia: local infiltration Local anesthetic: LET (lido,epi,tetracaine) and topical anesthetic Patient sedated: no Preparation: Patient was prepped and draped in the usual sterile fashion. Irrigation solution: saline Irrigation method: syringe Amount of cleaning: standard Debridement: none Degree of undermining: none Skin closure: Steri-Strips Number of sutures: 2 (steri-strips) Technique: simple Approximation: close Approximation difficulty: simple Dressing: 4x4 sterile gauze Patient tolerance: Patient tolerated the procedure well with no immediate complications   (including critical care time)  Labs Review Labs Reviewed - No data to display  Imaging Review No results found.   MDM   1. Skin tear of left forearm without complication, initial encounter   2. History of hypertension    Skin tear of Left forearm repaired with 2 steri-strips w/o  immediate complication.  Due to hx of diabetes and several specs of possible dirt or other debris, will place pt on antibiotics to help prevent secondary infection.  Rx: Keflex  BP elevated in triage, hx of HTN, asymptomatic. Recheck- 183/75.  Encouraged to f/u with PCP for BP monitoring. F/u with PCP or return to [facility] if needed in 4-5 days if not improving, sooner if signs of infection. Patient verbalized understanding and agreement with treatment plan.     Noland Fordyce, PA-C 07/26/15 1441

## 2015-07-30 ENCOUNTER — Emergency Department (INDEPENDENT_AMBULATORY_CARE_PROVIDER_SITE_OTHER)
Admission: EM | Admit: 2015-07-30 | Discharge: 2015-07-30 | Disposition: A | Discharge status: Home / Self Care | Payer: Medicare Other | Source: Home / Self Care | Attending: Family Medicine | Admitting: Family Medicine

## 2015-07-30 ENCOUNTER — Encounter: Payer: Self-pay | Admitting: Emergency Medicine

## 2015-07-30 DIAGNOSIS — Z5189 Encounter for other specified aftercare: Secondary | ICD-10-CM

## 2015-07-30 NOTE — Discharge Instructions (Signed)
Your wound appears to be healing well, no signs of infection.  You may now start using an over the counter antibiotic ointment such as Neosporin or Polysporin.  You may gently clean area with soap and water.  The steri-strips should start to loosen up within the next 5 days.  If they start to come off, be gentle taking them off and protect that area of your arm for another 2-3 weeks as wound will still be vulnerable to reopening if hit on something.    You may put a light dressing on the wound when being active and when applying antibiotic ointment, but otherwise, it may be uncovered when at home.

## 2015-07-30 NOTE — ED Provider Notes (Signed)
CSN: [id number]     Arrival date & time 07/30/15  0803 History   First MD Initiated Contact with Patient 07/30/15 0813     Chief Complaint  Patient presents with  . Wound Check   (Consider location/radiation/quality/duration/timing/severity/associated sxs/prior Treatment) HPI Carmen Cooper is a 75 y.o. female presenting to UC with for a recheck of a skin tear on her Left forearm. Pt was seen for initial injury at [facility] on 07/26/15, steri-strips were applied.  She has been taking keflex as prescribed.  Pt wanted to make sure it was healing well. Denies increased pain, bleeding, redness, swelling or pus. Steri-strips still in place.    Past Medical History  Diagnosis Date  . Diabetes ([place])   . Hypertension   . Thyroid disease    History reviewed. No pertinent past surgical history. Family History  Problem Relation Age of Onset  . Diabetes Neg Hx    Social History  Substance Use Topics  . Smoking status: Current Every Day Smoker  . Smokeless tobacco: None  . Alcohol Use: Yes     Comment: 2-3 a day   OB History    No data available     Review of Systems  Constitutional: Negative for fever and chills.  Skin: Positive for color change and wound.    Allergies  Clindamycin/lincomycin and Valacyclovir hcl  Home Medications   Prior to Admission medications   Medication Sig Start Date End Date Taking? Authorizing Provider  alendronate (FOSAMAX) 70 MG tablet Take 1 tablet (70 mg total) by mouth every 7 (seven) days. Take with a full glass of water on an empty stomach. Patient not taking: Reported on 07/19/2015 09/11/13   Marcial Pacas, DO  AMBULATORY NON FORMULARY MEDICATION Freestyle light test strips Test twice a day  Dx type 2 diabetes E11.9 01/14/14   Marcial Pacas, DO  aspirin EC 325 MG tablet Take 1 tablet (325 mg total) by mouth daily. 08/11/13   Sean Hommel, DO  atorvastatin (LIPITOR) 40 MG tablet Take 1 tablet (40 mg total) by mouth daily. 07/19/15   Sean Hommel, DO   benazepril-hydrochlorthiazide (LOTENSIN HCT) 20-12.5 MG tablet Take 1 tablet by mouth daily. 07/19/15   Sean Hommel, DO  cephALEXin (KEFLEX) 500 MG capsule Take 1 capsule (500 mg total) by mouth 2 (two) times daily. For 7 days 07/26/15   Noland Fordyce, PA-C  cloNIDine (CATAPRES) 0.2 MG tablet Take 0.5 tablets (0.1 mg total) by mouth daily. 01/14/14   Marcial Pacas, DO  Insulin Glargine (LANTUS SOLOSTAR) 100 UNIT/ML Solostar Pen Inject 20 Units into the skin at bedtime. 11/27/13   Sean Hommel, DO  insulin lispro (HUMALOG) 100 UNIT/ML KiwkPen 6 Units SQ with meals.  If glucose 150-200 add 1 unit, 201-250 add 2 units, etc. 04/05/15   Marcial Pacas, DO  levothyroxine (SYNTHROID, LEVOTHROID) 112 MCG tablet TAKE 1 TABLET DAILY 07/21/14   Marcial Pacas, DO   Meds Ordered and Administered this Visit  Medications - No data to display  BP 133/65 mmHg  Pulse 64  Temp(Src) 97.8 F (36.6 C) (Oral)  Resp 16  SpO2 98% No data found.   Physical Exam  Constitutional: She is oriented to person, place, and time. She appears well-developed and well-nourished.  HENT:  Head: Normocephalic and atraumatic.  Eyes: EOM are normal.  Neck: Normal range of motion.  Cardiovascular: Normal rate.   Pulmonary/Chest: Effort normal.  Musculoskeletal: Normal range of motion. She exhibits no edema or tenderness.  Neurological: She is alert and oriented  to person, place, and time.  Skin: Skin is warm and dry.  Left forearm- well healing skin tear, mild ecchymosis. Two steri-strips still in place. Superficial abrasion still present. No edema, erythema, or discharge.   Psychiatric: She has a normal mood and affect. Her behavior is normal.  Nursing note and vitals reviewed.   ED Course  Procedures (including critical care time)  Labs Review Labs Reviewed - No data to display  Imaging Review No results found.    MDM   1. Encounter for wound re-check    Wound appears to be healing well.  Steri-strips still in place.  Pt  may start using antibiotic ointment on superficial abrasion and continue gentle cleaning with soap and water as this will help loosen steri-strips. Strips will likely be in place for another 5 days.  As strips loosen, she may gently remove them.   Encouraged to keep wound covered when active as skin is still weak and could open back up if bumped again before healing completely. F/u with PCP or return to [facility] if needed for wound check in 5-7 days, sooner if worsening.    Noland Fordyce, PA-C 07/30/15 1024

## 2015-07-30 NOTE — ED Notes (Signed)
Patient here for follow up on left arm laceration.

## 2015-08-13 DIAGNOSIS — H35351 Cystoid macular degeneration, right eye: Secondary | ICD-10-CM | POA: Diagnosis not present

## 2015-08-18 DIAGNOSIS — E1065 Type 1 diabetes mellitus with hyperglycemia: Secondary | ICD-10-CM | POA: Diagnosis not present

## 2015-08-18 DIAGNOSIS — E039 Hypothyroidism, unspecified: Secondary | ICD-10-CM | POA: Diagnosis not present

## 2015-08-18 DIAGNOSIS — E1021 Type 1 diabetes mellitus with diabetic nephropathy: Secondary | ICD-10-CM | POA: Diagnosis not present

## 2015-08-18 DIAGNOSIS — E1069 Type 1 diabetes mellitus with other specified complication: Secondary | ICD-10-CM | POA: Diagnosis not present

## 2015-08-18 DIAGNOSIS — E785 Hyperlipidemia, unspecified: Secondary | ICD-10-CM | POA: Diagnosis not present

## 2015-08-18 DIAGNOSIS — E1049 Type 1 diabetes mellitus with other diabetic neurological complication: Secondary | ICD-10-CM | POA: Diagnosis not present

## 2015-09-15 DIAGNOSIS — H209 Unspecified iridocyclitis: Secondary | ICD-10-CM | POA: Diagnosis not present

## 2015-09-15 DIAGNOSIS — H26493 Other secondary cataract, bilateral: Secondary | ICD-10-CM | POA: Diagnosis not present

## 2015-09-15 DIAGNOSIS — Z961 Presence of intraocular lens: Secondary | ICD-10-CM | POA: Diagnosis not present

## 2015-09-15 DIAGNOSIS — H4043X3 Glaucoma secondary to eye inflammation, bilateral, severe stage: Secondary | ICD-10-CM | POA: Diagnosis not present

## 2015-10-05 ENCOUNTER — Emergency Department (INDEPENDENT_AMBULATORY_CARE_PROVIDER_SITE_OTHER)
Admission: EM | Admit: 2015-10-05 | Discharge: 2015-10-05 | Disposition: A | Discharge status: Home / Self Care | Payer: Medicare Other | Source: Home / Self Care | Attending: Family Medicine | Admitting: Family Medicine

## 2015-10-05 ENCOUNTER — Encounter: Payer: Self-pay | Admitting: *Deleted

## 2015-10-05 DIAGNOSIS — S0591XA Unspecified injury of right eye and orbit, initial encounter: Secondary | ICD-10-CM

## 2015-10-05 DIAGNOSIS — S0501XA Injury of conjunctiva and corneal abrasion without foreign body, right eye, initial encounter: Secondary | ICD-10-CM | POA: Diagnosis not present

## 2015-10-05 DIAGNOSIS — Z8669 Personal history of other diseases of the nervous system and sense organs: Secondary | ICD-10-CM

## 2015-10-05 DIAGNOSIS — H5711 Ocular pain, right eye: Secondary | ICD-10-CM

## 2015-10-05 HISTORY — DX: Unspecified glaucoma: H40.9

## 2015-10-05 NOTE — Discharge Instructions (Signed)
°  You were evaluated today for a Right eye injury and pain. No foreign body or corneal abrasion was seen on exam to explain your severe pain. Please follow up with [facility] today at 2:45 for further evaluation.

## 2015-10-05 NOTE — ED Provider Notes (Signed)
CSN: [id number]     Arrival date & time 10/05/15  1252 History   First MD Initiated Contact with Patient 10/05/15 1340     Chief Complaint  Patient presents with  . Eye Problem   (Consider location/radiation/quality/duration/timing/severity/associated sxs/prior Treatment) HPI  Carmen Cooper is a 75 y.o. female presenting to UC with c/o sudden onset Right eye pain just PTA as she felt something fly into her eye while weed eating.  Pain is burning, moderate to severe.  She attempted to rinse eye at home as well as use saline drops but no relief.  Pt has difficulty opening her eye due to the pain.  She reports hx of hx of glaucoma. She saw he optometrist last week and notes everything was going well.   Past Medical History:  Diagnosis Date  . Diabetes ([place])   . Glaucoma   . Hypertension   . Thyroid disease    History reviewed. No pertinent surgical history. Family History  Problem Relation Age of Onset  . Diabetes Neg Hx    Social History  Substance Use Topics  . Smoking status: Current Every Day Smoker  . Smokeless tobacco: Never Used  . Alcohol use Yes     Comment: 2-3 a day   OB History    No data available     Review of Systems  Eyes: Positive for photophobia, pain, discharge ( watery), redness and visual disturbance (due to pain, difficulty keeping eye open). Negative for itching.  Skin: Negative for color change, rash and wound.    Allergies  Clindamycin/lincomycin and Valacyclovir hcl  Home Medications   Prior to Admission medications   Medication Sig Start Date End Date Taking? Authorizing Provider  alendronate (FOSAMAX) 70 MG tablet Take 1 tablet (70 mg total) by mouth every 7 (seven) days. Take with a full glass of water on an empty stomach. Patient not taking: Reported on 07/19/2015 09/11/13   Marcial Pacas, DO  AMBULATORY NON FORMULARY MEDICATION Freestyle light test strips Test twice a day  Dx type 2 diabetes E11.9 01/14/14   Marcial Pacas, DO  aspirin EC 325 MG  tablet Take 1 tablet (325 mg total) by mouth daily. 08/11/13   Sean Hommel, DO  atorvastatin (LIPITOR) 40 MG tablet Take 1 tablet (40 mg total) by mouth daily. 07/19/15   Sean Hommel, DO  benazepril-hydrochlorthiazide (LOTENSIN HCT) 20-12.5 MG tablet Take 1 tablet by mouth daily. 07/19/15   Marcial Pacas, DO  cloNIDine (CATAPRES) 0.2 MG tablet Take 0.5 tablets (0.1 mg total) by mouth daily. 01/14/14   Marcial Pacas, DO  Insulin Glargine (LANTUS SOLOSTAR) 100 UNIT/ML Solostar Pen Inject 20 Units into the skin at bedtime. 11/27/13   Sean Hommel, DO  insulin lispro (HUMALOG) 100 UNIT/ML KiwkPen 6 Units SQ with meals.  If glucose 150-200 add 1 unit, 201-250 add 2 units, etc. 04/05/15   Marcial Pacas, DO  levothyroxine (SYNTHROID, LEVOTHROID) 112 MCG tablet TAKE 1 TABLET DAILY 07/21/14   Marcial Pacas, DO   Meds Ordered and Administered this Visit  Medications - No data to display  BP 184/98 (BP Location: Left Arm)   Pulse 68   Temp 98.3 F (36.8 C) (Oral)   SpO2 96%  No data found.   Physical Exam  Constitutional: She is oriented to person, place, and time. She appears well-developed and well-nourished. No distress.  Pt lying on exam bed, NAD  HENT:  Head: Normocephalic and atraumatic.  Eyes: EOM and lids are normal. Pupils are equal, round, and  reactive to light. Lids are everted and swept, no foreign bodies found. Right eye exhibits no discharge. No foreign body present in the right eye. Right conjunctiva is injected. Right conjunctiva has no hemorrhage.  Right eye: no foreign body seen on exam. No fluorescein uptake   Neck: Normal range of motion.  Cardiovascular: Normal rate.   Pulmonary/Chest: Effort normal.  Musculoskeletal: Normal range of motion.  Neurological: She is alert and oriented to person, place, and time.  Skin: Skin is warm and dry. She is not diaphoretic.  Psychiatric: She has a normal mood and affect. Her behavior is normal.  Nursing note and vitals reviewed.   Urgent Care Course    Clinical Course    Procedures (including critical care time)  Labs Review Labs Reviewed - No data to display  Imaging Review No results found.    MDM   1. Right eye injury, initial encounter   2. Acute right eye pain   3. History of glaucoma    Pt c/o severe pain in Right eye after foreign body flew into eye while weed eating earlier today. No FB seen on exam. No fluorescein uptake.  Due to moderate to severe pain as well as hx of glaucoma, recommend further evaluation today by optometrist. Pt scheduled to f/u with [facility] in [place] at 2:45PM today.   Pt safe to go POV.     Noland Fordyce, PA-C 10/05/15 1829

## 2015-10-05 NOTE — ED Triage Notes (Signed)
Pt reports that while weed eating this AM, something flew into her right eye. She rinsed it @ home and used saline drops without relief. She is unable to keep this eye open.

## 2015-10-06 ENCOUNTER — Telehealth: Payer: Self-pay | Admitting: Emergency Medicine

## 2015-10-06 DIAGNOSIS — S0501XD Injury of conjunctiva and corneal abrasion without foreign body, right eye, subsequent encounter: Secondary | ICD-10-CM | POA: Diagnosis not present

## 2015-10-06 NOTE — Telephone Encounter (Signed)
Has been to eye doc who is treating, still hurts but being treated

## 2015-10-08 DIAGNOSIS — T1511XD Foreign body in conjunctival sac, right eye, subsequent encounter: Secondary | ICD-10-CM | POA: Diagnosis not present

## 2015-10-08 DIAGNOSIS — S0500XD Injury of conjunctiva and corneal abrasion without foreign body, unspecified eye, subsequent encounter: Secondary | ICD-10-CM | POA: Diagnosis not present

## 2015-10-11 ENCOUNTER — Other Ambulatory Visit: Payer: Self-pay

## 2015-10-19 ENCOUNTER — Ambulatory Visit: Payer: TRICARE For Life (TFL) | Admitting: Family Medicine

## 2015-10-22 ENCOUNTER — Ambulatory Visit (INDEPENDENT_AMBULATORY_CARE_PROVIDER_SITE_OTHER): Payer: Medicare Other

## 2015-10-22 ENCOUNTER — Ambulatory Visit: Payer: TRICARE For Life (TFL) | Admitting: Osteopathic Medicine

## 2015-10-22 ENCOUNTER — Ambulatory Visit (INDEPENDENT_AMBULATORY_CARE_PROVIDER_SITE_OTHER): Payer: Medicare Other | Admitting: Family Medicine

## 2015-10-22 VITALS — BP 168/81 | HR 59 | Wt 154.0 lb

## 2015-10-22 DIAGNOSIS — I10 Essential (primary) hypertension: Secondary | ICD-10-CM

## 2015-10-22 DIAGNOSIS — IMO0002 Reserved for concepts with insufficient information to code with codable children: Secondary | ICD-10-CM

## 2015-10-22 DIAGNOSIS — E113599 Type 2 diabetes mellitus with proliferative diabetic retinopathy without macular edema, unspecified eye: Secondary | ICD-10-CM

## 2015-10-22 DIAGNOSIS — Z1239 Encounter for other screening for malignant neoplasm of breast: Secondary | ICD-10-CM

## 2015-10-22 DIAGNOSIS — E1165 Type 2 diabetes mellitus with hyperglycemia: Secondary | ICD-10-CM

## 2015-10-22 DIAGNOSIS — M8580 Other specified disorders of bone density and structure, unspecified site: Secondary | ICD-10-CM

## 2015-10-22 DIAGNOSIS — E031 Congenital hypothyroidism without goiter: Secondary | ICD-10-CM

## 2015-10-22 DIAGNOSIS — Z1231 Encounter for screening mammogram for malignant neoplasm of breast: Secondary | ICD-10-CM

## 2015-10-22 DIAGNOSIS — Z780 Asymptomatic menopausal state: Secondary | ICD-10-CM

## 2015-10-22 DIAGNOSIS — Z794 Long term (current) use of insulin: Secondary | ICD-10-CM

## 2015-10-22 DIAGNOSIS — H4041X0 Glaucoma secondary to eye inflammation, right eye, stage unspecified: Secondary | ICD-10-CM

## 2015-10-22 DIAGNOSIS — Z23 Encounter for immunization: Secondary | ICD-10-CM

## 2015-10-22 DIAGNOSIS — H209 Unspecified iridocyclitis: Secondary | ICD-10-CM

## 2015-10-22 LAB — HEMOGLOBIN A1C: A1c: 8.5

## 2015-10-22 MED ORDER — AMLODIPINE BESYLATE 10 MG PO TABS: 10.0000 mg | ORAL_TABLET | Freq: Every day | ORAL | 1 refills | Status: DC

## 2015-10-22 MED ORDER — ZOSTER VACCINE LIVE 19400 UNT/0.65ML ~~LOC~~ SUSR: 0.6500 mL | Freq: Once | SUBCUTANEOUS | 0 refills | Status: AC

## 2015-10-22 NOTE — Progress Notes (Signed)
Carmen Cooper is a 75 y.o. female who presents to [name] today for follow-up hypertension diabetes and hyperlipidemia.  Patient has diabetes that is managed via endocrinology at [facility]. Her last A1c was 8.5 on July 5.  She currently uses sliding scale insulin and Lantus at night. She denies any significant polyuria or polydipsia but does note her sugars run as high as 400s and as low as 100s.  Hypertension: Patient currently takes benazepril/hydrochlorothiazide daily. She uses clonidine 1 time a day. No chest pain palpitations or shortness of breath.  Hyperlipidemia: Currently taking atorvastatin. Patient notes her last lipid panel was 12/25/2014. She denies any significant leg pain.   Past Medical History:  Diagnosis Date  . Diabetes ([place])   . Glaucoma   . Hypertension   . Thyroid disease    No past surgical history on file. Social History  Substance Use Topics  . Smoking status: Current Every Day Smoker  . Smokeless tobacco: Never Used  . Alcohol use Yes     Comment: 2-3 a day   family history is not on file.  ROS as above:  Medications: Current Outpatient Prescriptions  Medication Sig Dispense Refill  . AMBULATORY NON FORMULARY MEDICATION Freestyle light test strips Test twice a day  Dx type 2 diabetes E11.9 100 each 11  . aspirin EC 325 MG tablet Take 1 tablet (325 mg total) by mouth daily. 30 tablet 0  . atorvastatin (LIPITOR) 40 MG tablet Take 1 tablet (40 mg total) by mouth daily. 90 tablet 1  . benazepril-hydrochlorthiazide (LOTENSIN HCT) 20-12.5 MG tablet Take 1 tablet by mouth daily. 90 tablet 1  . Insulin Glargine (LANTUS SOLOSTAR) 100 UNIT/ML Solostar Pen Inject 20 Units into the skin at bedtime. 5 pen PRN  . insulin lispro (HUMALOG) 100 UNIT/ML KiwkPen 6 Units SQ with meals.  If glucose 150-200 add 1 unit, 201-250 add 2 units, etc. 15 mL 5    . levothyroxine (SYNTHROID, LEVOTHROID) 112 MCG tablet TAKE 1 TABLET DAILY 90 tablet 0  . amLODipine (NORVASC) 10 MG tablet Take 1 tablet (10 mg total) by mouth daily. 90 tablet 1  . Zoster Vaccine Live, PF, (ZOSTAVAX) 29562 UNT/0.65ML injection Inject 19,400 Units into the skin once. If given in pharmacy fax report to Dr Georgina Snell [phone] 1 each 0   No current facility-administered medications for this visit.    Allergies  Allergen Reactions  . Clindamycin/Lincomycin Rash  . Valacyclovir Hcl Rash     Exam:  BP (!) 168/81   Pulse (!) 59   Wt 154 lb (69.9 kg)   BMI 25.63 kg/m  Gen: Well NAD HEENT: EOMI,  MMM Lungs: Normal work of breathing. CTABL Heart: RRR no MRG Abd: NABS, Soft. Nondistended, Nontender Exts: Brisk capillary refill, warm and well perfused.  Feet bilaterally are well appearing however the right foot has a small well healing wound on the dorsal aspect of the great toe. Sensation pulses capillary refill are intact throughout.  No results found for this or any previous visit (from the past 24 hour(s)). No results found.    Assessment and Plan: 75 y.o. female with  Diabetes: Not well controlled. Managed per endocrinology. Watchful waiting. Foot exam performed today.  Hypertension: Not well controlled. Add amlodipine. Recheck in one month. Labs imported from [place].  Lipidemia: Doing well. Continue current regimen. Obtain records from [facility] for fasting lipids this fall.  High dose flu vaccine and Prevnar 13 pneumonia  vaccine given today. Shingles vaccine ordered and sent to pharmacy Mammogram ordered DEXA scan ordered   Orders Placed This Encounter  Procedures  . MM Digital Screening    Standing Status:   Future    Number of Occurrences:   1    Standing Expiration Date:   12/21/2016    Order Specific Question:   Reason for Exam (SYMPTOM  OR DIAGNOSIS REQUIRED)    Answer:   screening    Order Specific Question:   Preferred imaging location?     Answer:   [facility]  . DG Bone Density    Standing Status:   Future    Standing Expiration Date:   12/22/2016    Order Specific Question:   Reason for exam:    Answer:   eval bone density    Order Specific Question:   Preferred imaging location?    Answer:   [facility]  . Pneumococcal conjugate vaccine 13-valent  . Flu vaccine HIGH DOSE PF (Fluzone High dose)    Discussed warning signs or symptoms. Please see discharge instructions. Patient expresses understanding.

## 2015-10-22 NOTE — Patient Instructions (Signed)
Thank you for coming in today. Return in 1 month.  STOP clonidine and START amlodipine.  Go to radiology to schedule Mammogram and Bone Density test.  You should get shingles vaccine at the pharmacy.   Call or go to the emergency room if you get worse, have trouble breathing, have chest pains, or palpitations.

## 2015-10-27 ENCOUNTER — Ambulatory Visit (INDEPENDENT_AMBULATORY_CARE_PROVIDER_SITE_OTHER): Payer: Medicare Other

## 2015-10-27 DIAGNOSIS — M85861 Other specified disorders of bone density and structure, right lower leg: Secondary | ICD-10-CM | POA: Diagnosis not present

## 2015-10-27 DIAGNOSIS — M8580 Other specified disorders of bone density and structure, unspecified site: Secondary | ICD-10-CM

## 2015-10-27 DIAGNOSIS — M85851 Other specified disorders of bone density and structure, right thigh: Secondary | ICD-10-CM | POA: Diagnosis not present

## 2015-10-27 DIAGNOSIS — M8588 Other specified disorders of bone density and structure, other site: Secondary | ICD-10-CM | POA: Diagnosis not present

## 2015-10-27 DIAGNOSIS — Z780 Asymptomatic menopausal state: Secondary | ICD-10-CM

## 2015-11-15 ENCOUNTER — Encounter: Payer: Self-pay | Admitting: Family Medicine

## 2015-11-16 ENCOUNTER — Encounter: Payer: Self-pay | Admitting: *Deleted

## 2015-11-16 ENCOUNTER — Emergency Department (INDEPENDENT_AMBULATORY_CARE_PROVIDER_SITE_OTHER)
Admission: EM | Admit: 2015-11-16 | Discharge: 2015-11-16 | Disposition: A | Discharge status: Home / Self Care | Payer: Medicare Other | Source: Home / Self Care | Attending: Family Medicine | Admitting: Family Medicine

## 2015-11-16 DIAGNOSIS — H409 Unspecified glaucoma: Secondary | ICD-10-CM | POA: Diagnosis not present

## 2015-11-16 DIAGNOSIS — R531 Weakness: Secondary | ICD-10-CM | POA: Diagnosis not present

## 2015-11-16 DIAGNOSIS — R7309 Other abnormal glucose: Secondary | ICD-10-CM | POA: Diagnosis not present

## 2015-11-16 DIAGNOSIS — Z888 Allergy status to other drugs, medicaments and biological substances status: Secondary | ICD-10-CM | POA: Diagnosis not present

## 2015-11-16 DIAGNOSIS — R9431 Abnormal electrocardiogram [ECG] [EKG]: Secondary | ICD-10-CM | POA: Diagnosis not present

## 2015-11-16 DIAGNOSIS — F17200 Nicotine dependence, unspecified, uncomplicated: Secondary | ICD-10-CM | POA: Diagnosis not present

## 2015-11-16 DIAGNOSIS — Z794 Long term (current) use of insulin: Secondary | ICD-10-CM | POA: Diagnosis not present

## 2015-11-16 DIAGNOSIS — E039 Hypothyroidism, unspecified: Secondary | ICD-10-CM | POA: Diagnosis not present

## 2015-11-16 DIAGNOSIS — I10 Essential (primary) hypertension: Secondary | ICD-10-CM | POA: Diagnosis not present

## 2015-11-16 DIAGNOSIS — Z79899 Other long term (current) drug therapy: Secondary | ICD-10-CM | POA: Diagnosis not present

## 2015-11-16 DIAGNOSIS — E1165 Type 2 diabetes mellitus with hyperglycemia: Secondary | ICD-10-CM | POA: Diagnosis not present

## 2015-11-16 DIAGNOSIS — Z7982 Long term (current) use of aspirin: Secondary | ICD-10-CM | POA: Diagnosis not present

## 2015-11-16 DIAGNOSIS — I491 Atrial premature depolarization: Secondary | ICD-10-CM

## 2015-11-16 DIAGNOSIS — R739 Hyperglycemia, unspecified: Secondary | ICD-10-CM

## 2015-11-16 DIAGNOSIS — E119 Type 2 diabetes mellitus without complications: Secondary | ICD-10-CM | POA: Diagnosis not present

## 2015-11-16 DIAGNOSIS — E785 Hyperlipidemia, unspecified: Secondary | ICD-10-CM | POA: Diagnosis not present

## 2015-11-16 DIAGNOSIS — Z881 Allergy status to other antibiotic agents status: Secondary | ICD-10-CM | POA: Diagnosis not present

## 2015-11-16 LAB — POCT FASTING CBG KUC MANUAL ENTRY: POCT Glucose (KUC): 565 mg/dL — AB (ref 70–99)

## 2015-11-16 NOTE — ED Triage Notes (Signed)
Pt reports blood sugars over 400 for the past 2 days with sweats, epigastric pain and feeling like her heart is beating fast.

## 2015-11-16 NOTE — ED Provider Notes (Signed)
Carmen Cooper is a 75 y.o. female presenting to UC with c/o chest pain, abdominal pain and stating "I just don't feel well" for the last 2 days. Nausea but no vomiting.  Symptoms gradually worsening.  She has a hx of DM and notes she has been having trouble getting her sugar under control as it has been in the 250s.  Yesterday it was in the 400s. Denies hx of heart problems but states it feels like her heart is racing and skipping beats.    Vitals:   11/16/15 0923  BP: 150/87  Pulse: 99  Resp: 16  Temp: 98 F (36.7 C)   Pt lying on exam bed, appears mildly anxious w/o respiratory distress. Heart pounding with regular rhythm.  Abdomen soft, tenderness in epigastrium w/o pulsatile mass.   Date/Time:11/16/2015   09:18:11 Ventricular Rate: 96 bpm PR Interval: 130ms QRS Duration: 11ms QT Interval: 333ms QTC Calculation: 475ms P-R-T axes: 63-29-45 Text Interpretation: Sinus rhythm with premature atrial complexes. Nonspecific ST abnormality. Abnormal EKG  EKG from 06/17/2012- Sinus brachycardia, otheriwse normal EKG  CBG in UC- 565 Pt is unsure if she has ever been in DKA or if she is Type 1 or Type 2 as she notes it has been noted both ways on her paper.  Due to severely elevated CBG and chest discomfort, recommend pt go to emergency department for further evaluation and treatment. Pt agreeable to go via EMS. Pt transferred to [facility] in stable condition.   Noland Fordyce, PA-C 11/16/15 1006

## 2015-11-18 DIAGNOSIS — E1049 Type 1 diabetes mellitus with other diabetic neurological complication: Secondary | ICD-10-CM | POA: Diagnosis not present

## 2015-11-18 DIAGNOSIS — E1021 Type 1 diabetes mellitus with diabetic nephropathy: Secondary | ICD-10-CM | POA: Diagnosis not present

## 2015-11-18 DIAGNOSIS — E1065 Type 1 diabetes mellitus with hyperglycemia: Secondary | ICD-10-CM | POA: Diagnosis not present

## 2015-11-18 DIAGNOSIS — I10 Essential (primary) hypertension: Secondary | ICD-10-CM | POA: Diagnosis not present

## 2015-11-18 DIAGNOSIS — E039 Hypothyroidism, unspecified: Secondary | ICD-10-CM | POA: Diagnosis not present

## 2015-11-18 DIAGNOSIS — E785 Hyperlipidemia, unspecified: Secondary | ICD-10-CM | POA: Diagnosis not present

## 2015-11-18 DIAGNOSIS — E1069 Type 1 diabetes mellitus with other specified complication: Secondary | ICD-10-CM | POA: Diagnosis not present

## 2015-11-18 LAB — HEMOGLOBIN A1C: Hemoglobin A1C: 9

## 2015-11-19 ENCOUNTER — Encounter: Payer: Self-pay | Admitting: Family Medicine

## 2015-11-19 ENCOUNTER — Ambulatory Visit (INDEPENDENT_AMBULATORY_CARE_PROVIDER_SITE_OTHER): Payer: Medicare Other | Admitting: Family Medicine

## 2015-11-19 VITALS — BP 134/77 | HR 89 | Wt 152.0 lb

## 2015-11-19 DIAGNOSIS — E1039 Type 1 diabetes mellitus with other diabetic ophthalmic complication: Secondary | ICD-10-CM

## 2015-11-19 DIAGNOSIS — E1040 Type 1 diabetes mellitus with diabetic neuropathy, unspecified: Secondary | ICD-10-CM

## 2015-11-19 DIAGNOSIS — Z794 Long term (current) use of insulin: Secondary | ICD-10-CM

## 2015-11-19 DIAGNOSIS — E109 Type 1 diabetes mellitus without complications: Secondary | ICD-10-CM | POA: Insufficient documentation

## 2015-11-19 DIAGNOSIS — E1165 Type 2 diabetes mellitus with hyperglycemia: Secondary | ICD-10-CM | POA: Diagnosis not present

## 2015-11-19 DIAGNOSIS — E1139 Type 2 diabetes mellitus with other diabetic ophthalmic complication: Secondary | ICD-10-CM | POA: Insufficient documentation

## 2015-11-19 DIAGNOSIS — E113599 Type 2 diabetes mellitus with proliferative diabetic retinopathy without macular edema, unspecified eye: Secondary | ICD-10-CM | POA: Diagnosis not present

## 2015-11-19 DIAGNOSIS — E1149 Type 2 diabetes mellitus with other diabetic neurological complication: Secondary | ICD-10-CM | POA: Insufficient documentation

## 2015-11-19 DIAGNOSIS — E1065 Type 1 diabetes mellitus with hyperglycemia: Secondary | ICD-10-CM

## 2015-11-19 DIAGNOSIS — IMO0002 Reserved for concepts with insufficient information to code with codable children: Secondary | ICD-10-CM | POA: Insufficient documentation

## 2015-11-19 DIAGNOSIS — I10 Essential (primary) hypertension: Secondary | ICD-10-CM | POA: Diagnosis not present

## 2015-11-19 LAB — CBC
HEMATOCRIT: 46.1 % — AB (ref 35.0–45.0)
HEMOGLOBIN: 15.7 g/dL — AB (ref 11.7–15.5)
MCH: 30.7 pg (ref 27.0–33.0)
MCHC: 34.1 g/dL (ref 32.0–36.0)
MCV: 90.2 fL (ref 80.0–100.0)
MPV: 10.3 fL (ref 7.5–12.5)
Platelets: 277 10*3/uL (ref 140–400)
RBC: 5.11 MIL/uL — ABNORMAL HIGH (ref 3.80–5.10)
RDW: 13.1 % (ref 11.0–15.0)
WBC: 4.8 10*3/uL (ref 3.8–10.8)

## 2015-11-19 LAB — COMPLETE METABOLIC PANEL WITH GFR
ALBUMIN: 4 g/dL (ref 3.6–5.1)
ALK PHOS: 68 U/L (ref 33–130)
ALT: 14 U/L (ref 6–29)
AST: 21 U/L (ref 10–35)
BILIRUBIN TOTAL: 0.8 mg/dL (ref 0.2–1.2)
BUN: 24 mg/dL (ref 7–25)
CALCIUM: 9.3 mg/dL (ref 8.6–10.4)
CHLORIDE: 100 mmol/L (ref 98–110)
CO2: 30 mmol/L (ref 20–31)
CREATININE: 1.04 mg/dL — AB (ref 0.60–0.93)
GFR, Est African American: 61 mL/min (ref >=60)
GFR, Est Non African American: 53 mL/min — ABNORMAL LOW (ref >=60)
Glucose, Bld: 301 mg/dL — ABNORMAL HIGH (ref 65–99)
Potassium: 4.2 mmol/L (ref 3.5–5.3)
Sodium: 140 mmol/L (ref 135–146)
TOTAL PROTEIN: 6.7 g/dL (ref 6.1–8.1)

## 2015-11-19 LAB — MAGNESIUM: MAGNESIUM: 1.5 mg/dL (ref 1.5–2.5)

## 2015-11-19 NOTE — Patient Instructions (Signed)
Thank you for coming in today. Check labs today.  Return in 2 weeks for recheck.

## 2015-11-19 NOTE — Progress Notes (Signed)
Carmen Cooper is a 75 y.o. female who presents to [facility]: [place] today for follow-up of recent hospitalization. Patient is a history of poorly controlled diabetes.  She had a hyperglycemic episode and was seen in the emergency room where she received treatment and followed up with her endocrinologist yesterday. She has essentially maximized the medical therapy that she is able to do for diabetes resulting in an hemoglobin A1c of 9.   She notes she continues to feel somewhat symptomatic with a mild headache and fatigue. She denies any dizziness lightheadedness or impaired balance fevers or chills.   Past Medical History:  Diagnosis Date  . Diabetes ([place])   . Glaucoma   . Hypertension   . Thyroid disease    No past surgical history on file. Social History  Substance Use Topics  . Smoking status: Current Every Day Smoker  . Smokeless tobacco: Never Used  . Alcohol use Yes     Comment: 2-3 a day   family history is not on file.  ROS as above:  Medications: Current Outpatient Prescriptions  Medication Sig Dispense Refill  . AMBULATORY NON FORMULARY MEDICATION Freestyle light test strips Test twice a day  Dx type 2 diabetes E11.9 100 each 11  . amLODipine (NORVASC) 10 MG tablet Take 1 tablet (10 mg total) by mouth daily. 90 tablet 1  . aspirin EC 325 MG tablet Take 1 tablet (325 mg total) by mouth daily. 30 tablet 0  . atorvastatin (LIPITOR) 40 MG tablet Take 1 tablet (40 mg total) by mouth daily. 90 tablet 1  . benazepril-hydrochlorthiazide (LOTENSIN HCT) 20-12.5 MG tablet Take 1 tablet by mouth daily. 90 tablet 1  . Insulin Glargine (LANTUS SOLOSTAR) 100 UNIT/ML Solostar Pen Inject 20 Units into the skin at bedtime. 5 pen PRN  . insulin lispro (HUMALOG) 100 UNIT/ML KiwkPen 6 Units SQ with meals.  If glucose 150-200 add 1 unit, 201-250 add 2 units, etc. 15 mL 5  .  levothyroxine (SYNTHROID, LEVOTHROID) 112 MCG tablet TAKE 1 TABLET DAILY 90 tablet 0  . amLODipine-benazepril (LOTREL) 5-20 MG capsule Take by mouth.     No current facility-administered medications for this visit.    Allergies  Allergen Reactions  . Clindamycin/Lincomycin Rash  . Valacyclovir Hcl Rash     Exam:  BP 134/77   Pulse 89   Wt 152 lb (68.9 kg)   BMI 25.29 kg/m  Gen: Well NAD Non-toxic appearing.  HEENT: EOMI,  MMM Lungs: Normal work of breathing. CTABL Heart: RRR no MRG Abd: NABS, Soft. Nondistended, Nontender Exts: Brisk capillary refill, warm and well perfused.  Neuro: Alert and oriented normal balance and gait.  Lab Results  Component Value Date   HGBA1C 9.0 11/18/2015     No results found for this or any previous visit (from the past 24 hour(s)). No results found.    Assessment and Plan: 75 y.o. female with Improving hyperglycemic episode with dehydration. Plan to recheck labs listed below and return for recheck in about 2 weeks. Recommend adequate hydration and salt intake. Additionally recommend continued glucose control with insulin as instructed by endocrinology.  Endocrinology notes from [facility] attached below.   Orders Placed This Encounter  Procedures  . Hemoglobin A1c    This external order was created through the Results Console.  . CBC  . COMPLETE METABOLIC PANEL WITH GFR  . Magnesium    Discussed warning signs or symptoms. Please see discharge instructions.  Patient expresses understanding.   Appendix endocrinology notes from Kandis Cocking, MD - 11/18/2015 3:08 PM EDT Formatting of this note may be different from the original. History of Present Illness This is a 75 y.o. female who returns for follow-up of type 1 diabetes. Carmen Cooper was exposed to poison oak last week. On October 2nd her blood sugars spiked to the 400-600s resulting in an ER visit on October 3rd. She has continued taking Lantus Solostar 18 units QPM and  Humalog Kwikpen 6/8/8 units TID-AC. Blood sugars continue to be erratic with fasting glucoses 70s to mid-200s and daytime glucoses 50s to 300s. Hgb A1C 9.0%.  Past Medical History:  Diagnosis Date  . Diabetes mellitus (*)  . Diabetes mellitus type I (*)  . Disease of thyroid gland  . Glaucoma  . Hyperlipidemia  . Hypertension  . Hypothyroidism   Past Surgical History:  Procedure Laterality Date  . Eye surgery   Allergies  Allergen Reactions  . Brimonidine Tartrate Other  Burning and redness Burning and redness  . Clindamycin/Lincomycin Unknown  . Valacyclovir Hcl Unknown   Current Outpatient Prescriptions on File Prior to Visit  Medication Sig Dispense Refill  . aspirin 325 mg tablet Take by mouth.  Marland Kitchen atorvastatin (LIPITOR) 40 mg tablet Take 1 tablet (40 mg total) by mouth daily. 30 tablet 11  . benazepril-hydrochlorthiazide (LOTENSIN HCT) 20-12.5 MG per tablet Take 1 tablet by mouth daily.  . Blood Glucose Monitoring Suppl (FREESTYLE LITE) DEVI 1 each by Does not apply route 5 (five) times daily. 1 each 0  . cephALEXin (KEFLEX) 500 mg capsule Take 500 mg by mouth.  . Cinnamon 500 MG capsule Take 500 mg by mouth daily.  . clobetasol (TEMOVATE) 0.05 % ointment Apply topically 2 (two) times daily. 30 g 0  . cloNIDine (CATAPRES) 0.2 mg tablet Take 0.2 mg by mouth 2 (two) times daily.  . dorzolamide-timolol (COSOPT) 22.3-6.8 MG/ML ophthalmic solution Place 1 drop into both eyes 2 (two) times daily.  . fish oil 1000 mg CAPS Take 1 capsule by mouth daily.  Marland Kitchen FREESTYLE LITE test strip USE 1 STRIP FIVE TIMES DAILY 450 each 2  . furosemide (LASIX) 20 mg tablet One by mouth one to two times a day as needed for swelling in the legs.  Marland Kitchen glucose blood (FREESTYLE LITE) test strip 1 each by Other route 5 (five) times daily. 450 each 3  . insulin glargine (LANTUS SOLOSTAR) 100 units/mL pen Inject 0.18 mLs (18 Units total) into the skin every evening. 30 mL 3  . Insulin Lispro (HUMALOG  KWIKPEN) 100 UNIT/ML SOPN 6 units with breakfast and 8 units with lunch and supper plus sliding scale coverage. 30 mL 3  . multivitamin-iron-minerals-folic acid (CENTRUM) chewable tablet Chew 1 tablet by mouth daily.  . prednisoLONE acetate (PRED FORTE) 1% ophthalmic suspension Place 1 drop into both eyes 2 (two) times daily. 0   No current facility-administered medications on file prior to visit.   No family history on file. Social History   Social History  . Marital status: Married  Spouse name: N/A  . Number of children: N/A  . Years of education: N/A   Occupational History  . Not on file.   Social History Main Topics  . Smoking status: Current Every Day Smoker  Packs/day: 0.50  . Smokeless tobacco: Never Used  . Alcohol use 0.0 oz/week  0 Standard drinks or equivalent per week  Comment: one daily  . Drug use: No  .  Sexual activity: Not on file   Other Topics Concern  . Not on file   Social History Narrative   Review of Systems Eight systems were reviewed; pertinent positives and negatives are as mentioned in the HPI.   Physical Examination There were no vitals filed for this visit. There is no height or weight on file to calculate BMI. GENERAL: Pleasant, well-appearing female in no distress. HEENT: Pupils equal, round and reactive to light. Extraocular movements intact. Nondilated fundoscopic exam reveals no signs of retinopathy. Mucous membranes moist with no oropharyngeal lesions. NECK: Supple with no lymphadenopathy or thyromegaly. CARDIOVASCULAR: Normal rate and regular rhythm with no murmurs, rubs or gallops; 2+ distal pulses. EXTREMITIES: Warm and well-perfused. No clubbing, cyanosis or edema. NEUROLOGIC: Alert and oriented x3. Gait and station normal. SKIN: Warm and dry, with no rashes. No acanthosis nigricans.  Results for orders placed or performed in visit on 11/18/15 (from the past 168 hour(s))  POCT A1C  Collection Time: 11/18/15 2:01 PM  Result Value  Ref Range  Hemoglobin A1c 9.0 (A) 4.8 - 5.6 %  Results for orders placed or performed during the hospital encounter of 11/16/15 (from the past 168 hour(s))  POCT Glucose  Collection Time: 11/16/15 10:19 AM  Result Value Ref Range  Glucose, POC 443 (HH) 70 - 99 mg/dL  UA with Reflexive Urine Culture, Urine Catheter  Collection Time: 11/16/15 10:35 AM  Result Value Ref Range  Urine Color Yellow Yellow  Urine Appearance Clear Clear  Urine Specific Gravity 1.010 1.005 - 1.030  Urine pH 6.0 5 to 7  Urine Protein - Dipstick Trace Negative mg/dl  Urine Glucose >=1000 (A) Negative  Urine Ketones 15 (A) Negative mg/dl  Urine Bilirubin Negative Negative  Urine Blood Negative Negative  Urine Nitrite Negative Negative  Urine Urobilinogen 0.2 0.2 , 1.0 mg/dl  Urine Leukocyte Esterase Negative Negative  CBC And Differential  Collection Time: 11/16/15 10:54 AM  Result Value Ref Range  WBC 7.9 3.7 - 11.0 thou/mcL  RBC 5.56 (H) 4.01 - 4.90 million/mcL  HGB 16.4 (H) 12.2 - 14.9 gm/dL  HCT 49.7 (H) 35.8 - 47.9 %  MCV 89 82 - 98 fL  MCH 29.5 27.0 - 33.0 pg  MCHC 33.0 31.0 - 37.0 gm/dL  Plt Ct 271 150 - 400 thou/mcL  RDW SD 42.3 38.1 - 49.1 fL  MPV 10.3 8.9 - 11.2 fL  NEUTROPHIL % 75.9 (H) 50.0 - 70.0 %  LYMPHOCYTE % 16.8 (L) 25.0 - 40.0 %  MONOCYTE % 5.8 4.0 - 12.0 %  Eosinophil % 0.9 (L) 1.0 - 6.0 %  BASOPHIL % 0.6 0.0 - 2.0 %  ABSOLUTE NEUTROPHIL COUNT 6.00 1.50 - 7.50 thou/mcL  ABSOLUTE LYMPHOCYTE COUNT 1.3 1.0 - 4.5 thou/mcL  MONO ABSOLUTE 0.5 0.1 - 0.8 thou/mcL  EOS ABSOLUTE 0.1 0.0 - 0.5 thou/mcL  BASO ABSOLUTE 0.1 0.0 - 0.2 thou/mcL  Comprehensive Metabolic Panel  Collection Time: 11/16/15 10:54 AM  Result Value Ref Range  Na 140 136 - 146 mmol/L  Potassium 4.7 3.7 - 5.4 mmol/L  Cl 92 (L) 97 - 108 mmol/L  CO2 26 20 - 32 mmol/L  Glucose 586 (HH) 65 - 99 mg/dL  BUN 31 (H) 8 - 27 mg/dL  Creatinine 1.03 (H) 0.57 - 1.00 mg/dL  Ca 10.4 (H) 8.6 - 10.2 mg/dL  ALK PHOS 92 25 - 165  IU/L  T Bili 1.06 0.00 - 1.20 mg/dL  Total Protein 7.7 6.0 - 8.5 gm/dL  Alb 4.4 3.5 -  4.8 gm/dL  GLOBULIN 3.3 1.5 - 4.5 gm/dL  ALBUMIN/GLOBULIN RATIO 1.3 1.1 - 2.5  BUN/CREAT RATIO 30.1 (H) 11.0 - 26.0  ALT 14 0 - 40 IU/L  AST 14 0 - 40 IU/L  GFR AFRICAN AMERICAN 61 mL/min/1.28m  GFR Non African American 53 mL/min/1.727m AGAP 22 (H) 7 - 16 mmol/L  Troponin T  Collection Time: 11/16/15 10:54 AM  Result Value Ref Range  Trop T <0.010 <0.010  TSH  Collection Time: 11/16/15 10:54 AM  Result Value Ref Range  TSH 0.58 0.45 - 4.50 mcIU/mL  Free T4  Collection Time: 11/16/15 10:54 AM  Result Value Ref Range  Free T4 1.94 (H) 0.82 - 1.77 ng/dL  Blood Gas, Venous  Collection Time: 11/16/15 12:06 PM  Result Value Ref Range  PH VENOUS 7.31 7.31 - 7.41  PCO2 VENOUS 50.0 40.0 - 50.0 mmHg  PO2 VENOUS 37 (L) 40 - 50 mmHg  BASE EXCESS VENOUS -1.6 -2.0 - 2.0 mmol/L  BICARBONATE VENOUS 25 21 - 28 mEq/L  MODE VENOUS ra  TOTAL RATE ART 20.0000  OXYGEN SATURATION VENOUS 64.0 %  ALLENS TEST n  RESPIRATORY CARE NUMBER OF STICKS [facility] 1.0  CALLED RESULTS dr masters  READ BACK vrbv  DATE TIME CALLED 11-16-15 1230  POCT Glucose  Collection Time: 11/16/15 12:39 PM  Result Value Ref Range  Glucose, POC 412 (HH) 70 - 99 mg/dL  POCT Glucose  Collection Time: 11/16/15 2:23 PM  Result Value Ref Range  Glucose, POC 410 (HH) 70 - 99 mg/dL   A/P:  1. Type 1 Diabetes w/neuropathy: Hgb A1C up to 9.0%. AnNikkolas exposed to poison oak last week. On October 2nd her blood sugars spiked to the 400-600s resulting in an ER visit on October 3rd. She has continued taking Lantus Solostar 18 units QPM and Humalog Kwikpen 6/8/8 units TID-AC. Blood sugars continue to be erratic with fasting glucoses 70s to mid-200s and daytime glucoses 50s to 300s. AnNansiasn't been able to do carbohydrate counting. An attempt to get her on an older Medtronics insulin pump was unsuccessful. She says [facility] to pursue getting her own CGM. I'm running out of options for providing support and improving her diabetes management. I gave her a brochure on the newer Medtronics insulin pump which has a better, clearer screen. I also gave her the phone number for Dexcom to call. I advised her to keep diabetes identification and glucose tablets with her at all times as she is high risk for a major hypoglycemic episode. A Glucagon Emergency Kit was reordered.  2. Type 1 Diabetes w/microalbuminuria: Continue Lisinopril.  3. Hypothyroidism: TSH was down to 0.58 with Free T4 borderline-high at 1.93 on 11/16/15 ER labs. Borderline hyperthyroidism may have negative effects on her glycemic control. I'll lower her Levothyroxine from 125 to 11272mDaily and repeat a TSH in 6 weeks.  4. Hyperlipidemia: On Atorvastatin 47m42mily. Repeat lipids in 6 weeks.  Follow-up in 6 weeks.

## 2015-11-30 ENCOUNTER — Other Ambulatory Visit: Payer: Self-pay | Admitting: *Deleted

## 2015-11-30 DIAGNOSIS — H35351 Cystoid macular degeneration, right eye: Secondary | ICD-10-CM | POA: Diagnosis not present

## 2015-11-30 DIAGNOSIS — E119 Type 2 diabetes mellitus without complications: Secondary | ICD-10-CM | POA: Diagnosis not present

## 2015-11-30 DIAGNOSIS — I10 Essential (primary) hypertension: Secondary | ICD-10-CM

## 2015-11-30 DIAGNOSIS — H2013 Chronic iridocyclitis, bilateral: Secondary | ICD-10-CM | POA: Diagnosis not present

## 2015-11-30 MED ORDER — AMLODIPINE BESYLATE 10 MG PO TABS: 10.0000 mg | ORAL_TABLET | Freq: Every day | ORAL | 0 refills | Status: DC

## 2015-12-06 ENCOUNTER — Encounter: Payer: Self-pay | Admitting: Family Medicine

## 2015-12-06 ENCOUNTER — Ambulatory Visit (INDEPENDENT_AMBULATORY_CARE_PROVIDER_SITE_OTHER): Payer: Medicare Other | Admitting: Family Medicine

## 2015-12-06 VITALS — BP 117/78 | HR 73 | Wt 154.0 lb

## 2015-12-06 DIAGNOSIS — E031 Congenital hypothyroidism without goiter: Secondary | ICD-10-CM | POA: Diagnosis not present

## 2015-12-06 DIAGNOSIS — IMO0002 Reserved for concepts with insufficient information to code with codable children: Secondary | ICD-10-CM

## 2015-12-06 DIAGNOSIS — E1039 Type 1 diabetes mellitus with other diabetic ophthalmic complication: Secondary | ICD-10-CM

## 2015-12-06 DIAGNOSIS — E1065 Type 1 diabetes mellitus with hyperglycemia: Secondary | ICD-10-CM

## 2015-12-06 DIAGNOSIS — E1040 Type 1 diabetes mellitus with diabetic neuropathy, unspecified: Secondary | ICD-10-CM

## 2015-12-06 DIAGNOSIS — I10 Essential (primary) hypertension: Secondary | ICD-10-CM | POA: Diagnosis not present

## 2015-12-06 DIAGNOSIS — R5383 Other fatigue: Secondary | ICD-10-CM

## 2015-12-06 LAB — CBC
HEMATOCRIT: 43 % (ref 35.0–45.0)
HEMOGLOBIN: 14.5 g/dL (ref 11.7–15.5)
MCH: 30.3 pg (ref 27.0–33.0)
MCHC: 33.7 g/dL (ref 32.0–36.0)
MCV: 90 fL (ref 80.0–100.0)
MPV: 10.4 fL (ref 7.5–12.5)
Platelets: 271 10*3/uL (ref 140–400)
RBC: 4.78 MIL/uL (ref 3.80–5.10)
RDW: 13.7 % (ref 11.0–15.0)
WBC: 5.5 10*3/uL (ref 3.8–10.8)

## 2015-12-06 LAB — T4, FREE: FREE T4: 1.4 ng/dL (ref 0.8–1.8)

## 2015-12-06 LAB — T3, FREE: T3 FREE: 2.4 pg/mL (ref 2.3–4.2)

## 2015-12-06 LAB — TSH: TSH: 4.59 mIU/L — ABNORMAL HIGH

## 2015-12-06 NOTE — Patient Instructions (Signed)
Thank you for coming in today. Get labs today.  Return in 3 months or sooner if needed.   Fatigue Fatigue is feeling tired all of the time, a lack of energy, or a lack of motivation. Occasional or mild fatigue is often a normal response to activity or life in general. However, long-lasting (chronic) or extreme fatigue may indicate an underlying medical condition. HOME CARE INSTRUCTIONS  Watch your fatigue for any changes. The following actions may help to lessen any discomfort you are feeling:  Talk to your health care provider about how much sleep you need each night. Try to get the required amount every night.  Take medicines only as directed by your health care provider.  Eat a healthy and nutritious diet. Ask your health care provider if you need help changing your diet.  Drink enough fluid to keep your urine clear or pale yellow.  Practice ways of relaxing, such as yoga, meditation, massage therapy, or acupuncture.  Exercise regularly.   Change situations that cause you stress. Try to keep your work and personal routine reasonable.  Do not abuse illegal drugs.  Limit alcohol intake to no more than 1 drink per day for nonpregnant women and 2 drinks per day for men. One drink equals 12 ounces of beer, 5 ounces of wine, or 1 ounces of hard liquor.  Take a multivitamin, if directed by your health care provider. SEEK MEDICAL CARE IF:   Your fatigue does not get better.  You have a fever.   You have unintentional weight loss or gain.  You have headaches.   You have difficulty:   Falling asleep.  Sleeping throughout the night.  You feel angry, guilty, anxious, or sad.   You are unable to have a bowel movement (constipation).   You skin is dry.   Your legs or another part of your body is swollen.  SEEK IMMEDIATE MEDICAL CARE IF:   You feel confused.   Your vision is blurry.  You feel faint or pass out.   You have a severe headache.   You have  severe abdominal, pelvic, or back pain.   You have chest pain, shortness of breath, or an irregular or fast heartbeat.   You are unable to urinate or you urinate less than normal.   You develop abnormal bleeding, such as bleeding from the rectum, vagina, nose, lungs, or nipples.  You vomit blood.   You have thoughts about harming yourself or committing suicide.   You are worried that you might harm someone else.    This information is not intended to replace advice given to you by your health care provider. Make sure you discuss any questions you have with your health care provider.   Document Released: 11/27/2006 Document Revised: 02/20/2014 Document Reviewed: 06/03/2013 Elsevier Interactive Patient Education [employer].

## 2015-12-06 NOTE — Progress Notes (Signed)
Carmen Cooper is a 75 y.o. female who presents to [facility]: [place] today for fatigue. As noted previously patient was recently hospitalized. She presents to clinic today for a follow-up. She's reasonably well but does note continued fatigue. No vomiting or diarrhea chest pain or palpitations. She has diabetes that is difficult to control has also been noted previously. She denies any shortness of breath.   Past Medical History:  Diagnosis Date  . Diabetes ([place])   . Glaucoma   . Hypertension   . Thyroid disease    No past surgical history on file. Social History  Substance Use Topics  . Smoking status: Current Every Day Smoker  . Smokeless tobacco: Never Used  . Alcohol use Yes     Comment: 2-3 a day   family history is not on file.  ROS as above:  Medications: Current Outpatient Prescriptions  Medication Sig Dispense Refill  . AMBULATORY NON FORMULARY MEDICATION Freestyle light test strips Test twice a day  Dx type 2 diabetes E11.9 100 each 11  . amLODipine (NORVASC) 10 MG tablet Take 1 tablet (10 mg total) by mouth daily. 90 tablet 0  . amLODipine-benazepril (LOTREL) 5-20 MG capsule Take by mouth.    Marland Kitchen aspirin EC 325 MG tablet Take 1 tablet (325 mg total) by mouth daily. 30 tablet 0  . atorvastatin (LIPITOR) 40 MG tablet Take 1 tablet (40 mg total) by mouth daily. 90 tablet 1  . benazepril-hydrochlorthiazide (LOTENSIN HCT) 20-12.5 MG tablet Take 1 tablet by mouth daily. 90 tablet 1  . Insulin Glargine (LANTUS SOLOSTAR) 100 UNIT/ML Solostar Pen Inject 20 Units into the skin at bedtime. 5 pen PRN  . insulin lispro (HUMALOG) 100 UNIT/ML KiwkPen 6 Units SQ with meals.  If glucose 150-200 add 1 unit, 201-250 add 2 units, etc. 15 mL 5  . levothyroxine (SYNTHROID, LEVOTHROID) 112 MCG tablet TAKE 1 TABLET DAILY 90 tablet 0   No current facility-administered medications  for this visit.    Allergies  Allergen Reactions  . Clindamycin/Lincomycin Rash  . Sulfa Antibiotics Rash  . Valacyclovir Hcl Rash    Health Maintenance Health Maintenance  Topic Date Due  . ZOSTAVAX  02/13/2000  . HEMOGLOBIN A1C  05/18/2016  . OPHTHALMOLOGY EXAM  09/14/2016  . FOOT EXAM  10/21/2016  . PNA vac Low Risk Adult (2 of 2 - PPSV23) 10/21/2016  . COLONOSCOPY  03/19/2017  . TETANUS/TDAP  07/25/2025  . INFLUENZA VACCINE  Completed  . DEXA SCAN  Completed     Exam:  BP 117/78   Pulse 73   Wt 154 lb (69.9 kg)   SpO2 100%   BMI 25.63 kg/m  Gen: Well NAD Nontoxic appearing HEENT: EOMI,  MMM Lungs: Normal work of breathing. CTABL Heart: RRR no MRG Abd: NABS, Soft. Nondistended, Nontender Exts: Brisk capillary refill, warm and well perfused.    No results found for this or any previous visit (from the past 72 hour(s)). No results found.    Assessment and Plan: 75 y.o. female with fatigue. Possibly related to her poorly controlled diabetes or thyroid disorder or mania for other potential medical problems. We'll obtain limited workup listed below area if all is well continue watchful waiting and recheck in about 3 months.   Diabetes: Continue current management with endocrinology  Hypothyroidism: Also continue comanagement with endocrinology.  Hypertension: Well controlled. Recommend checking home blood pressure readings. Patient may be developing hypotension at home.  Orders Placed This Encounter  Procedures  . CBC  . COMPLETE METABOLIC PANEL WITH GFR  . T4, free  . T3, free  . TSH    Discussed warning signs or symptoms. Please see discharge instructions. Patient expresses understanding.

## 2015-12-07 LAB — COMPLETE METABOLIC PANEL WITH GFR
ALBUMIN: 3.8 g/dL (ref 3.6–5.1)
ALK PHOS: 66 U/L (ref 33–130)
ALT: 17 U/L (ref 6–29)
AST: 17 U/L (ref 10–35)
BILIRUBIN TOTAL: 0.7 mg/dL (ref 0.2–1.2)
BUN: 29 mg/dL — ABNORMAL HIGH (ref 7–25)
CALCIUM: 9.4 mg/dL (ref 8.6–10.4)
CHLORIDE: 101 mmol/L (ref 98–110)
CO2: 29 mmol/L (ref 20–31)
CREATININE: 1.07 mg/dL — AB (ref 0.60–0.93)
GFR, EST AFRICAN AMERICAN: 59 mL/min — AB (ref >=60)
GFR, Est Non African American: 51 mL/min — ABNORMAL LOW (ref >=60)
Glucose, Bld: 210 mg/dL — ABNORMAL HIGH (ref 65–99)
Potassium: 4.2 mmol/L (ref 3.5–5.3)
Sodium: 141 mmol/L (ref 135–146)
TOTAL PROTEIN: 6.5 g/dL (ref 6.1–8.1)

## 2016-01-03 DIAGNOSIS — E1069 Type 1 diabetes mellitus with other specified complication: Secondary | ICD-10-CM | POA: Diagnosis not present

## 2016-01-03 DIAGNOSIS — E039 Hypothyroidism, unspecified: Secondary | ICD-10-CM | POA: Diagnosis not present

## 2016-01-03 DIAGNOSIS — I10 Essential (primary) hypertension: Secondary | ICD-10-CM | POA: Diagnosis not present

## 2016-01-03 DIAGNOSIS — E1021 Type 1 diabetes mellitus with diabetic nephropathy: Secondary | ICD-10-CM | POA: Diagnosis not present

## 2016-01-03 DIAGNOSIS — E1065 Type 1 diabetes mellitus with hyperglycemia: Secondary | ICD-10-CM | POA: Diagnosis not present

## 2016-01-03 DIAGNOSIS — E785 Hyperlipidemia, unspecified: Secondary | ICD-10-CM | POA: Diagnosis not present

## 2016-01-03 DIAGNOSIS — E1049 Type 1 diabetes mellitus with other diabetic neurological complication: Secondary | ICD-10-CM | POA: Diagnosis not present

## 2016-01-04 DIAGNOSIS — E1065 Type 1 diabetes mellitus with hyperglycemia: Secondary | ICD-10-CM | POA: Diagnosis not present

## 2016-01-04 DIAGNOSIS — E1069 Type 1 diabetes mellitus with other specified complication: Secondary | ICD-10-CM | POA: Diagnosis not present

## 2016-01-04 DIAGNOSIS — E039 Hypothyroidism, unspecified: Secondary | ICD-10-CM | POA: Diagnosis not present

## 2016-01-04 DIAGNOSIS — E785 Hyperlipidemia, unspecified: Secondary | ICD-10-CM | POA: Diagnosis not present

## 2016-01-04 DIAGNOSIS — E1021 Type 1 diabetes mellitus with diabetic nephropathy: Secondary | ICD-10-CM | POA: Diagnosis not present

## 2016-01-04 LAB — BASIC METABOLIC PANEL: Glucose: 522 mg/dL

## 2016-01-26 DIAGNOSIS — H209 Unspecified iridocyclitis: Secondary | ICD-10-CM | POA: Diagnosis not present

## 2016-01-26 DIAGNOSIS — H26493 Other secondary cataract, bilateral: Secondary | ICD-10-CM | POA: Diagnosis not present

## 2016-01-26 DIAGNOSIS — H4043X3 Glaucoma secondary to eye inflammation, bilateral, severe stage: Secondary | ICD-10-CM | POA: Diagnosis not present

## 2016-01-26 DIAGNOSIS — Z961 Presence of intraocular lens: Secondary | ICD-10-CM | POA: Diagnosis not present

## 2016-02-17 ENCOUNTER — Other Ambulatory Visit: Payer: Self-pay | Admitting: *Deleted

## 2016-02-17 MED ORDER — ATORVASTATIN CALCIUM 40 MG PO TABS: 40.0000 mg | ORAL_TABLET | Freq: Every day | ORAL | 0 refills | Status: DC

## 2016-02-29 DIAGNOSIS — E1069 Type 1 diabetes mellitus with other specified complication: Secondary | ICD-10-CM | POA: Diagnosis not present

## 2016-02-29 DIAGNOSIS — E1049 Type 1 diabetes mellitus with other diabetic neurological complication: Secondary | ICD-10-CM | POA: Diagnosis not present

## 2016-02-29 DIAGNOSIS — E1021 Type 1 diabetes mellitus with diabetic nephropathy: Secondary | ICD-10-CM | POA: Diagnosis not present

## 2016-02-29 DIAGNOSIS — I10 Essential (primary) hypertension: Secondary | ICD-10-CM | POA: Diagnosis not present

## 2016-02-29 DIAGNOSIS — E1065 Type 1 diabetes mellitus with hyperglycemia: Secondary | ICD-10-CM | POA: Diagnosis not present

## 2016-02-29 DIAGNOSIS — E039 Hypothyroidism, unspecified: Secondary | ICD-10-CM | POA: Diagnosis not present

## 2016-02-29 DIAGNOSIS — E785 Hyperlipidemia, unspecified: Secondary | ICD-10-CM | POA: Diagnosis not present

## 2016-02-29 LAB — HEPATIC FUNCTION PANEL
ALK PHOS: 83 U/L (ref 25–125)
ALT: 16 U/L (ref 7–35)
AST: 27 U/L (ref 13–35)
Bilirubin, Total: 1 mg/dL

## 2016-02-29 LAB — LIPID PANEL
CHOLESTEROL: 169 mg/dL (ref 0–200)
HDL: 98 mg/dL — AB (ref 35–70)
LDL CALC: 53 mg/dL
LDL Cholesterol: 53 mg/dL
TRIGLYCERIDES: 90 mg/dL (ref 40–160)

## 2016-02-29 LAB — BASIC METABOLIC PANEL
BUN: 25 mg/dL — AB (ref 4–21)
Creatinine: 1.1 mg/dL (ref 0.5–1.1)
Potassium: 4.4 mmol/L (ref 3.4–5.3)
Sodium: 138 mmol/L (ref 137–147)

## 2016-02-29 LAB — TSH
TSH: 2.83 u[IU]/mL (ref 0.41–5.90)
TSH: 2.83 u[IU]/mL (ref 0.41–5.90)

## 2016-02-29 LAB — MICROALBUMIN, URINE: MICROALB UR: 55.3

## 2016-02-29 LAB — CALCIUM: Calcium: 9.5 mg/dL

## 2016-02-29 LAB — HEMOGLOBIN A1C: HEMOGLOBIN A1C: 8.5

## 2016-03-06 ENCOUNTER — Ambulatory Visit (INDEPENDENT_AMBULATORY_CARE_PROVIDER_SITE_OTHER): Payer: Medicare Other | Admitting: Family Medicine

## 2016-03-06 VITALS — BP 110/60 | HR 66 | Temp 98.0°F | Wt 156.0 lb

## 2016-03-06 DIAGNOSIS — E1065 Type 1 diabetes mellitus with hyperglycemia: Secondary | ICD-10-CM

## 2016-03-06 DIAGNOSIS — E1039 Type 1 diabetes mellitus with other diabetic ophthalmic complication: Secondary | ICD-10-CM

## 2016-03-06 DIAGNOSIS — E1040 Type 1 diabetes mellitus with diabetic neuropathy, unspecified: Secondary | ICD-10-CM | POA: Diagnosis not present

## 2016-03-06 DIAGNOSIS — IMO0002 Reserved for concepts with insufficient information to code with codable children: Secondary | ICD-10-CM

## 2016-03-06 DIAGNOSIS — E031 Congenital hypothyroidism without goiter: Secondary | ICD-10-CM | POA: Diagnosis not present

## 2016-03-06 DIAGNOSIS — I10 Essential (primary) hypertension: Secondary | ICD-10-CM | POA: Diagnosis not present

## 2016-03-06 DIAGNOSIS — R109 Unspecified abdominal pain: Secondary | ICD-10-CM | POA: Insufficient documentation

## 2016-03-06 MED ORDER — DICYCLOMINE HCL 10 MG PO CAPS: 10.0000 mg | ORAL_CAPSULE | Freq: Three times a day (TID) | ORAL | 1 refills | Status: DC | PRN

## 2016-03-06 NOTE — Patient Instructions (Signed)
Thank you for coming in today. Try bentyl three times daily as needed.  Recheck in 6 months or sooner if needed.  If your belly pain worsens, or you have high fever, bad vomiting, blood in your stool or black tarry stool go to the Emergency Room.

## 2016-03-06 NOTE — Progress Notes (Signed)
Carmen Cooper is a 76 y.o. female who presents to [employer] [facility]: [place] today for follow-up diabetes hypertension and abdominal pain.  Diabetes: Patient is seen by an endocrinologist who is currently managing her diabetes. Her last A1c was 8.5 last week. She's feeling pretty well but does note hyperglycemic episodes polyuria.  Hypertension: Patient takes amlodipine as well as benazepril and hydrochlorothiazide. She denies chest pain palpitations or shortness of breath. She notes occasional leg swelling but overall is feeling pretty well.  Abdominal pain: Patient notes abdominal cramping and pain associated with diarrhea. Her symptoms are consistent with previous episodes of IBS. In the past she has been prescribed dicyclomine which has helped. She has run out of this medication and would like a refill if possible.     Past Medical History:  Diagnosis Date  . Diabetes ([place])   . Glaucoma   . Hypertension   . Thyroid disease    No past surgical history on file. Social History  Substance Use Topics  . Smoking status: Current Every Day Smoker  . Smokeless tobacco: Never Used  . Alcohol use Yes     Comment: 2-3 a day   family history is not on file.  ROS as above:  Medications: Current Outpatient Prescriptions  Medication Sig Dispense Refill  . benazepril-hydrochlorthiazide (LOTENSIN HCT) 20-12.5 MG tablet Take by mouth.    . Dorzolamide HCl-Timolol Mal PF 22.3-6.8 MG/ML SOLN Apply to eye.    Marland Kitchen AMBULATORY NON FORMULARY MEDICATION Freestyle light test strips Test twice a day  Dx type 2 diabetes E11.9 100 each 11  . amLODipine (NORVASC) 10 MG tablet Take 1 tablet (10 mg total) by mouth daily. 90 tablet 0  . aspirin EC 81 MG tablet Take by mouth.    Marland Kitchen atorvastatin (LIPITOR) 40 MG tablet Take 1 tablet (40 mg total) by mouth daily. Needs labs before future refills 90 tablet 0    . dicyclomine (BENTYL) 10 MG capsule Take 1 capsule (10 mg total) by mouth 3 (three) times daily as needed for spasms. 180 capsule 1  . FREESTYLE LITE test strip     . hypromellose (GENTEAL) 0.3 % GEL ophthalmic ointment Apply to eye.    . Insulin Glargine (LANTUS SOLOSTAR) 100 UNIT/ML Solostar Pen Inject 20 Units into the skin at bedtime. 5 pen PRN  . insulin lispro (HUMALOG) 100 UNIT/ML KiwkPen 6 Units SQ with meals.  If glucose 150-200 add 1 unit, 201-250 add 2 units, etc. 15 mL 5  . levothyroxine (SYNTHROID, LEVOTHROID) 112 MCG tablet TAKE 1 TABLET DAILY 90 tablet 0  . multivitamin-iron-minerals-folic acid (CENTRUM) chewable tablet Chew by mouth.    . Omega-3 1000 MG CAPS Take by mouth.    . prednisoLONE acetate (PRED FORTE) 1 % ophthalmic suspension     . ZIOPTAN 0.0015 % SOLN      No current facility-administered medications for this visit.    Allergies  Allergen Reactions  . Clindamycin/Lincomycin Rash  . Sulfa Antibiotics Rash  . Valacyclovir Hcl Rash    Health Maintenance Health Maintenance  Topic Date Due  . HEMOGLOBIN A1C  05/18/2016  . OPHTHALMOLOGY EXAM  09/14/2016  . FOOT EXAM  10/21/2016  . PNA vac Low Risk Adult (2 of 2 - PPSV23) 10/21/2016  . TETANUS/TDAP  07/25/2025  . INFLUENZA VACCINE  Completed  . DEXA SCAN  Completed  . ZOSTAVAX  Completed     Exam:  BP 110/60   Pulse  66   Temp 98 F (36.7 C)   Wt 156 lb (70.8 kg)   BMI 25.96 kg/m  Gen: Well NAD HEENT: EOMI,  MMM Lungs: Normal work of breathing. CTABL Heart: RRR no MRG Abd: NABS, Soft. Nondistended, Nontender Exts: Brisk capillary refill, warm and well perfused.     No results found for this or any previous visit (from the past 72 hour(s)). No results found.    Assessment and Plan: 76 y.o. female with  Diabetes: Not ideally controlled however think this is about as good as it can get. Continue management with endocrinology.  Hypertension: Reasonably well-controlled. Creatinine is  stable. Recheck in 6 months.  Abdominal pain. Very likely IBS based on patient's history and physical exam today. We'll prescribe dicyclomine trial. Recheck in 6 months or sooner if needed.   No orders of the defined types were placed in this encounter.   Discussed warning signs or symptoms. Please see discharge instructions. Patient expresses understanding.   Labs from [facility] attached below    Carmen Cooper Female, 76 y.o., March 04, 1940 MRN:  [id number]  A1c:  Office Visit on 02/29/2016  8.5  Care Everywhere Result Report Comprehensive Metabolic [id number] [facility] Component Name Value Ref Range  Glucose 522 (HH)  Comment:                   Client Requested Flag **Verified by repeat analysis** 65 - 99 mg/dL  BUN 25 8 - 27 mg/dL  Creatinine, Serum 1.12 (H) 0.57 - 1.00 mg/dL  eGFR If NonAfrican American 48 (L) >59 mL/min/1.73  eGFR If African American 56 (L) >59 mL/min/1.73  BUN/Creatinine Ratio 22 12 - 28   Sodium 138 134 - 144 mmol/L  Potassium 4.4 3.5 - 5.2 mmol/L  Chloride 93 (L) 96 - 106 mmol/L  CO2 24 18 - 29 mmol/L  CALCIUM 9.5 8.7 - 10.3 mg/dL  Total Protein 7.0 6.0 - 8.5 g/dL  Albumin, Serum 4.3 3.5 - 4.8 g/dL  Globulin, Total 2.7 1.5 - 4.5 g/dL  Albumin/Globulin Ratio 1.6 1.2 - 2.2   Total Bilirubin 1.0 0.0 - 1.2 mg/dL  Alkaline Phosphatase 83 39 - 117 IU/L   AST 27 0 - 40 IU/L   ALT (SGPT) 16 0 - 32 IU/L   Specimen  Blood  Result Narrative  Specimen Comment: Test(s) Glucose, Serum called to Nurse Beau Fanny on 01/05/2016 at 03:05 E Specimen Comment: ST Performed at:  [address] [facility] [address], [place], [place]  [id number] Lab Director: Lindon Romp MD, Phone:  [phone]    Care Everywhere Result Report Lipid panel11/21/2017 [facility] Component Name Value Ref Range  Cholesterol, Total 169 100 - 199 mg/dL  Triglycerides 90 0 - 149 mg/dL  HDL 98 >39 mg/dL  VLDL Cholesterol Cal 18 5 - 40 mg/dL  LDL Calculated 53 0 -  99 mg/dL  Specimen  Blood  Result Narrative  Specimen Comment: Test(s) Glucose, Serum called to Nurse Beau Fanny on 01/05/2016 at 03:05 E Specimen Comment: ST Performed at:  [address] [facility] [address] [employer], [place]  [id number] Lab Director: Lindon Romp MD, Phone:  [phone]   Care Everywhere Result Report TSH11/21/2017 [place] Component Name Value Ref Range  TSH 2.830 0.450 - 4.500 uIU/mL   Specimen  Blood  Result Narrative  Specimen Comment: Test(s) Glucose, Serum called to Nurse Beau Fanny on 01/05/2016 at 03:05 E Specimen Comment: ST Performed at:  [facility] [address], [place],  [place]  [id number] Lab Director: Lindon Romp MD, Phone:  [phone]

## 2016-04-04 ENCOUNTER — Other Ambulatory Visit: Payer: Self-pay | Admitting: Family Medicine

## 2016-04-04 DIAGNOSIS — I10 Essential (primary) hypertension: Secondary | ICD-10-CM

## 2016-04-18 DIAGNOSIS — H35351 Cystoid macular degeneration, right eye: Secondary | ICD-10-CM | POA: Diagnosis not present

## 2016-04-18 DIAGNOSIS — H2011 Chronic iridocyclitis, right eye: Secondary | ICD-10-CM | POA: Diagnosis not present

## 2016-04-24 ENCOUNTER — Other Ambulatory Visit: Payer: Self-pay

## 2016-04-24 MED ORDER — BENAZEPRIL-HYDROCHLOROTHIAZIDE 20-12.5 MG PO TABS: 1.0000 | ORAL_TABLET | Freq: Every day | ORAL | 1 refills | Status: DC

## 2016-04-30 ENCOUNTER — Other Ambulatory Visit: Payer: Self-pay | Admitting: Family Medicine

## 2016-05-04 ENCOUNTER — Ambulatory Visit: Payer: TRICARE For Life (TFL)

## 2016-05-29 DIAGNOSIS — E785 Hyperlipidemia, unspecified: Secondary | ICD-10-CM | POA: Diagnosis not present

## 2016-05-29 DIAGNOSIS — E1021 Type 1 diabetes mellitus with diabetic nephropathy: Secondary | ICD-10-CM | POA: Diagnosis not present

## 2016-05-29 DIAGNOSIS — I10 Essential (primary) hypertension: Secondary | ICD-10-CM | POA: Diagnosis not present

## 2016-05-29 DIAGNOSIS — E1069 Type 1 diabetes mellitus with other specified complication: Secondary | ICD-10-CM | POA: Diagnosis not present

## 2016-05-29 DIAGNOSIS — E039 Hypothyroidism, unspecified: Secondary | ICD-10-CM | POA: Diagnosis not present

## 2016-05-29 DIAGNOSIS — E1065 Type 1 diabetes mellitus with hyperglycemia: Secondary | ICD-10-CM | POA: Diagnosis not present

## 2016-05-29 DIAGNOSIS — E1049 Type 1 diabetes mellitus with other diabetic neurological complication: Secondary | ICD-10-CM | POA: Diagnosis not present

## 2016-05-29 LAB — HEMOGLOBIN A1C: Hemoglobin A1C: 8.1

## 2016-05-30 ENCOUNTER — Encounter: Payer: Self-pay | Admitting: Family Medicine

## 2016-06-05 DIAGNOSIS — Z961 Presence of intraocular lens: Secondary | ICD-10-CM | POA: Diagnosis not present

## 2016-06-05 DIAGNOSIS — H4043X3 Glaucoma secondary to eye inflammation, bilateral, severe stage: Secondary | ICD-10-CM | POA: Diagnosis not present

## 2016-06-05 DIAGNOSIS — H209 Unspecified iridocyclitis: Secondary | ICD-10-CM | POA: Insufficient documentation

## 2016-06-05 DIAGNOSIS — E109 Type 1 diabetes mellitus without complications: Secondary | ICD-10-CM | POA: Diagnosis not present

## 2016-07-11 DIAGNOSIS — E1065 Type 1 diabetes mellitus with hyperglycemia: Secondary | ICD-10-CM | POA: Diagnosis not present

## 2016-07-11 DIAGNOSIS — E1021 Type 1 diabetes mellitus with diabetic nephropathy: Secondary | ICD-10-CM | POA: Diagnosis not present

## 2016-07-11 DIAGNOSIS — E785 Hyperlipidemia, unspecified: Secondary | ICD-10-CM | POA: Diagnosis not present

## 2016-07-11 DIAGNOSIS — I10 Essential (primary) hypertension: Secondary | ICD-10-CM | POA: Diagnosis not present

## 2016-07-11 DIAGNOSIS — E039 Hypothyroidism, unspecified: Secondary | ICD-10-CM | POA: Diagnosis not present

## 2016-07-11 DIAGNOSIS — E1069 Type 1 diabetes mellitus with other specified complication: Secondary | ICD-10-CM | POA: Diagnosis not present

## 2016-07-11 DIAGNOSIS — E1049 Type 1 diabetes mellitus with other diabetic neurological complication: Secondary | ICD-10-CM | POA: Diagnosis not present

## 2016-07-19 ENCOUNTER — Other Ambulatory Visit: Payer: Self-pay | Admitting: Family Medicine

## 2016-07-19 DIAGNOSIS — I10 Essential (primary) hypertension: Secondary | ICD-10-CM

## 2016-08-04 DIAGNOSIS — H04121 Dry eye syndrome of right lacrimal gland: Secondary | ICD-10-CM | POA: Diagnosis not present

## 2016-08-15 DIAGNOSIS — H35351 Cystoid macular degeneration, right eye: Secondary | ICD-10-CM | POA: Diagnosis not present

## 2016-08-15 DIAGNOSIS — H2013 Chronic iridocyclitis, bilateral: Secondary | ICD-10-CM | POA: Diagnosis not present

## 2016-08-15 DIAGNOSIS — E119 Type 2 diabetes mellitus without complications: Secondary | ICD-10-CM | POA: Diagnosis not present

## 2016-08-16 DIAGNOSIS — E039 Hypothyroidism, unspecified: Secondary | ICD-10-CM | POA: Diagnosis not present

## 2016-08-16 DIAGNOSIS — H409 Unspecified glaucoma: Secondary | ICD-10-CM | POA: Diagnosis not present

## 2016-08-16 DIAGNOSIS — E785 Hyperlipidemia, unspecified: Secondary | ICD-10-CM | POA: Diagnosis not present

## 2016-08-16 DIAGNOSIS — Z881 Allergy status to other antibiotic agents status: Secondary | ICD-10-CM | POA: Diagnosis not present

## 2016-08-16 DIAGNOSIS — R5383 Other fatigue: Secondary | ICD-10-CM | POA: Diagnosis not present

## 2016-08-16 DIAGNOSIS — Z79899 Other long term (current) drug therapy: Secondary | ICD-10-CM | POA: Diagnosis not present

## 2016-08-16 DIAGNOSIS — H538 Other visual disturbances: Secondary | ICD-10-CM | POA: Diagnosis not present

## 2016-08-16 DIAGNOSIS — R0789 Other chest pain: Secondary | ICD-10-CM | POA: Diagnosis not present

## 2016-08-16 DIAGNOSIS — Z794 Long term (current) use of insulin: Secondary | ICD-10-CM | POA: Diagnosis not present

## 2016-08-16 DIAGNOSIS — E119 Type 2 diabetes mellitus without complications: Secondary | ICD-10-CM | POA: Diagnosis not present

## 2016-08-16 DIAGNOSIS — Z7982 Long term (current) use of aspirin: Secondary | ICD-10-CM | POA: Diagnosis not present

## 2016-08-16 DIAGNOSIS — Z886 Allergy status to analgesic agent status: Secondary | ICD-10-CM | POA: Diagnosis not present

## 2016-08-16 DIAGNOSIS — R739 Hyperglycemia, unspecified: Secondary | ICD-10-CM | POA: Diagnosis not present

## 2016-08-16 DIAGNOSIS — I10 Essential (primary) hypertension: Secondary | ICD-10-CM | POA: Diagnosis not present

## 2016-08-16 DIAGNOSIS — R531 Weakness: Secondary | ICD-10-CM | POA: Diagnosis not present

## 2016-08-16 DIAGNOSIS — F17210 Nicotine dependence, cigarettes, uncomplicated: Secondary | ICD-10-CM | POA: Diagnosis not present

## 2016-08-16 DIAGNOSIS — Z720 Tobacco use: Secondary | ICD-10-CM | POA: Diagnosis not present

## 2016-08-16 DIAGNOSIS — Z9641 Presence of insulin pump (external) (internal): Secondary | ICD-10-CM | POA: Diagnosis not present

## 2016-08-16 DIAGNOSIS — E871 Hypo-osmolality and hyponatremia: Secondary | ICD-10-CM | POA: Diagnosis not present

## 2016-08-16 DIAGNOSIS — E1065 Type 1 diabetes mellitus with hyperglycemia: Secondary | ICD-10-CM | POA: Diagnosis not present

## 2016-08-17 DIAGNOSIS — I10 Essential (primary) hypertension: Secondary | ICD-10-CM | POA: Diagnosis not present

## 2016-08-17 DIAGNOSIS — E119 Type 2 diabetes mellitus without complications: Secondary | ICD-10-CM | POA: Diagnosis not present

## 2016-08-17 DIAGNOSIS — E871 Hypo-osmolality and hyponatremia: Secondary | ICD-10-CM | POA: Diagnosis not present

## 2016-08-17 DIAGNOSIS — R739 Hyperglycemia, unspecified: Secondary | ICD-10-CM | POA: Diagnosis not present

## 2016-08-17 DIAGNOSIS — H409 Unspecified glaucoma: Secondary | ICD-10-CM | POA: Diagnosis not present

## 2016-08-17 DIAGNOSIS — Z794 Long term (current) use of insulin: Secondary | ICD-10-CM | POA: Diagnosis not present

## 2016-08-27 ENCOUNTER — Other Ambulatory Visit: Payer: Self-pay | Admitting: Family Medicine

## 2016-08-27 DIAGNOSIS — I10 Essential (primary) hypertension: Secondary | ICD-10-CM

## 2016-09-04 ENCOUNTER — Encounter: Payer: Self-pay | Admitting: Family Medicine

## 2016-09-04 ENCOUNTER — Ambulatory Visit (INDEPENDENT_AMBULATORY_CARE_PROVIDER_SITE_OTHER): Payer: Medicare Other | Admitting: Family Medicine

## 2016-09-04 VITALS — BP 132/52 | HR 97 | Wt 158.0 lb

## 2016-09-04 DIAGNOSIS — E031 Congenital hypothyroidism without goiter: Secondary | ICD-10-CM

## 2016-09-04 DIAGNOSIS — IMO0002 Reserved for concepts with insufficient information to code with codable children: Secondary | ICD-10-CM

## 2016-09-04 DIAGNOSIS — E1040 Type 1 diabetes mellitus with diabetic neuropathy, unspecified: Secondary | ICD-10-CM | POA: Diagnosis not present

## 2016-09-04 DIAGNOSIS — I10 Essential (primary) hypertension: Secondary | ICD-10-CM

## 2016-09-04 DIAGNOSIS — E1065 Type 1 diabetes mellitus with hyperglycemia: Secondary | ICD-10-CM | POA: Diagnosis not present

## 2016-09-04 DIAGNOSIS — R238 Other skin changes: Secondary | ICD-10-CM | POA: Diagnosis not present

## 2016-09-04 DIAGNOSIS — E1039 Type 1 diabetes mellitus with other diabetic ophthalmic complication: Secondary | ICD-10-CM

## 2016-09-04 LAB — T3, FREE: T3, Free: 2.6 pg/mL (ref 2.3–4.2)

## 2016-09-04 LAB — T4, FREE: FREE T4: 1.6 ng/dL (ref 0.8–1.8)

## 2016-09-04 LAB — TSH: TSH: 8.01 m[IU]/L — AB

## 2016-09-04 MED ORDER — METRONIDAZOLE 0.75 % EX GEL: 1.0000 "application " | Freq: Two times a day (BID) | CUTANEOUS | 3 refills | Status: DC

## 2016-09-04 MED ORDER — DICYCLOMINE HCL 10 MG PO CAPS: 10.0000 mg | ORAL_CAPSULE | Freq: Three times a day (TID) | ORAL | 1 refills | Status: DC | PRN

## 2016-09-04 NOTE — Patient Instructions (Signed)
Thank you for coming in today. Take the bentyl for gut spasm and diarrhea as needed  Get labs today.  Continue care with Endrocrinology.  Use the gel on the nose.  If the spot does not go away in 1 month we will do a shave biopsy.   Recheck in 3-6 months or sooner if needed.

## 2016-09-04 NOTE — Progress Notes (Signed)
Carmen Cooper is a 76 y.o. female who presents to [place] [place] today for 6 month follow-up visit.  Diarrhea: Patient reports having a history of irritable bowel syndrome. She states that over the past 2 weeks her diarrhea has become worse. Patient also reports associated dull LLQ pain which is worse before a bowel movement. She reports that after a bowel movement her pain is relieved. She endorses some stressors in her life. Patient reports that her stools are watery and brown. She denies any bloody bowel movements.   Nose nodule: Patient reports a new nodule on the tip of her nose. She states that it has been present for approximately 1 month. Patient notes that it looked like an insect bite at first, but cannot recall being stung or bitten by anything. She states that  it is not painful.   Diabetes: Doing better and managed via endocrinology.  She is currently using an insulin pump.    Past Medical History:  Diagnosis Date  . Diabetes ([place])   . Glaucoma   . Hypertension   . Thyroid disease    No past surgical history on file. Social History  Substance Use Topics  . Smoking status: Current Every Day Smoker  . Smokeless tobacco: Never Used  . Alcohol use Yes     Comment: 2-3 a day   family history is not on file.  ROS as above:  Medications: Current Outpatient Prescriptions  Medication Sig Dispense Refill  . AMBULATORY NON FORMULARY MEDICATION Freestyle light test strips Test twice a day  Dx type 2 diabetes E11.9 100 each 11  . amLODipine (NORVASC) 10 MG tablet TAKE 1 TABLET DAILY DUE FOR FOLLOW UP VISIT 15 tablet 0  . aspirin EC 81 MG tablet Take by mouth.    Marland Kitchen atorvastatin (LIPITOR) 40 MG tablet TAKE 1 TABLET DAILY ( NEED LABS BEFORE FUTURE REFILLS ) 30 tablet 0  . benazepril-hydrochlorthiazide (LOTENSIN HCT) 20-12.5 MG tablet Take 1 tablet by mouth daily.  90 tablet 1  . dicyclomine (BENTYL) 10 MG capsule Take 1 capsule (10 mg total) by mouth 3 (three) times daily as needed for spasms. 180 capsule 1  . Dorzolamide HCl-Timolol Mal PF 22.3-6.8 MG/ML SOLN Apply to eye.    Marland Kitchen FREESTYLE LITE test strip     . hypromellose (GENTEAL) 0.3 % GEL ophthalmic ointment Apply to eye.    . Insulin Glargine (LANTUS SOLOSTAR) 100 UNIT/ML Solostar Pen Inject 20 Units into the skin at bedtime. 5 pen PRN  . insulin lispro (HUMALOG) 100 UNIT/ML KiwkPen 6 Units SQ with meals.  If glucose 150-200 add 1 unit, 201-250 add 2 units, etc. 15 mL 5  . levothyroxine (SYNTHROID, LEVOTHROID) 112 MCG tablet TAKE 1 TABLET DAILY 90 tablet 0  . metroNIDAZOLE (METROGEL) 0.75 % gel Apply 1 application topically 2 (two) times daily. 45 g 3  . multivitamin-iron-minerals-folic acid (CENTRUM) chewable tablet Chew by mouth.    . Omega-3 1000 MG CAPS Take by mouth.    . prednisoLONE acetate (PRED FORTE) 1 % ophthalmic suspension     . ZIOPTAN 0.0015 % SOLN      No current facility-administered medications for this visit.    Allergies  Allergen Reactions  . Clindamycin/Lincomycin Rash  . Sulfa Antibiotics Rash  . Valacyclovir Hcl Rash    Health Maintenance Health Maintenance  Topic Date Due  . INFLUENZA VACCINE  09/13/2016  . OPHTHALMOLOGY EXAM  09/14/2016  . FOOT  EXAM  10/21/2016  . PNA vac Low Risk Adult (2 of 2 - PPSV23) 10/21/2016  . HEMOGLOBIN A1C  11/28/2016  . TETANUS/TDAP  07/25/2025  . DEXA SCAN  Completed     Exam:  BP (!) 132/52   Pulse 97   Wt 158 lb (71.7 kg)   SpO2 98%   BMI 26.29 kg/m  Gen: Well NAD HEENT: EOMI,  MMM, small elevation of skin present on tip of nose, no erythema or tenderness to palpation Lungs: Normal work of breathing. CTABL Heart: RRR, normal S1 and S2, no murmurs, rubs, or gallops Abd: NABS, Soft. Nondistended, Nontender, no rebound or guarding Exts: Brisk capillary refill, warm and well perfused, 2+ pitting edema present  bilaterally   No results found for this or any previous visit (from the past 72 hour(s)). No results found.    Assessment and Plan: 76 y.o. female presenting for 6 month follow-up visit. Given the patient's history of IBS and characterization of abdominal pain and diarrhea, this is most likely associated with IBS. Patient was given a prescription for dicyclomine and instructed to use three times daily.   Thyroid levels will also be checked to ensure that levothyroxine dose is appropriate. Patient's nose nodule appears to be a pimple. Patient was instructed to continue to watch it for any changes or resolution over the next month and to come back to clinic if it does not resolve within this time.  Diabetes: Doing reasonably well with endocrinology will continue to follow.   Orders Placed This Encounter  Procedures  . T4, free  . TSH  . T3, free   Meds ordered this encounter  Medications  . dicyclomine (BENTYL) 10 MG capsule    Sig: Take 1 capsule (10 mg total) by mouth 3 (three) times daily as needed for spasms.    Dispense:  180 capsule    Refill:  1  . metroNIDAZOLE (METROGEL) 0.75 % gel    Sig: Apply 1 application topically 2 (two) times daily.    Dispense:  45 g    Refill:  3     Discussed warning signs or symptoms. Please see discharge instructions. Patient expresses understanding.

## 2016-09-11 ENCOUNTER — Other Ambulatory Visit: Payer: Self-pay | Admitting: Family Medicine

## 2016-09-11 DIAGNOSIS — Z1239 Encounter for other screening for malignant neoplasm of breast: Secondary | ICD-10-CM

## 2016-09-15 ENCOUNTER — Other Ambulatory Visit: Payer: Self-pay

## 2016-09-15 DIAGNOSIS — I10 Essential (primary) hypertension: Secondary | ICD-10-CM

## 2016-09-15 MED ORDER — AMLODIPINE BESYLATE 10 MG PO TABS: ORAL_TABLET | ORAL | 1 refills | Status: DC

## 2016-10-03 ENCOUNTER — Other Ambulatory Visit: Payer: Self-pay | Admitting: Family Medicine

## 2016-10-10 DIAGNOSIS — E1049 Type 1 diabetes mellitus with other diabetic neurological complication: Secondary | ICD-10-CM | POA: Diagnosis not present

## 2016-10-10 DIAGNOSIS — E1069 Type 1 diabetes mellitus with other specified complication: Secondary | ICD-10-CM | POA: Diagnosis not present

## 2016-10-10 DIAGNOSIS — E1065 Type 1 diabetes mellitus with hyperglycemia: Secondary | ICD-10-CM | POA: Diagnosis not present

## 2016-10-10 DIAGNOSIS — I10 Essential (primary) hypertension: Secondary | ICD-10-CM | POA: Diagnosis not present

## 2016-10-10 DIAGNOSIS — E785 Hyperlipidemia, unspecified: Secondary | ICD-10-CM | POA: Diagnosis not present

## 2016-10-10 DIAGNOSIS — E1021 Type 1 diabetes mellitus with diabetic nephropathy: Secondary | ICD-10-CM | POA: Diagnosis not present

## 2016-10-10 DIAGNOSIS — E039 Hypothyroidism, unspecified: Secondary | ICD-10-CM | POA: Diagnosis not present

## 2016-10-15 ENCOUNTER — Other Ambulatory Visit: Payer: Self-pay | Admitting: Family Medicine

## 2016-10-27 ENCOUNTER — Ambulatory Visit (INDEPENDENT_AMBULATORY_CARE_PROVIDER_SITE_OTHER): Payer: Medicare Other

## 2016-10-27 DIAGNOSIS — Z1231 Encounter for screening mammogram for malignant neoplasm of breast: Secondary | ICD-10-CM | POA: Diagnosis not present

## 2016-10-27 DIAGNOSIS — Z1239 Encounter for other screening for malignant neoplasm of breast: Secondary | ICD-10-CM

## 2016-10-30 ENCOUNTER — Encounter: Payer: Self-pay | Admitting: Family Medicine

## 2016-10-30 ENCOUNTER — Ambulatory Visit (INDEPENDENT_AMBULATORY_CARE_PROVIDER_SITE_OTHER): Payer: Medicare Other | Admitting: Family Medicine

## 2016-10-30 ENCOUNTER — Emergency Department
Admission: EM | Admit: 2016-10-30 | Discharge: 2016-10-30 | Disposition: A | Discharge status: Home / Self Care | Payer: TRICARE For Life (TFL) | Source: Home / Self Care

## 2016-10-30 VITALS — BP 132/75 | HR 86 | Temp 98.2°F | Ht 65.0 in | Wt 156.0 lb

## 2016-10-30 DIAGNOSIS — I10 Essential (primary) hypertension: Secondary | ICD-10-CM | POA: Diagnosis not present

## 2016-10-30 DIAGNOSIS — Z23 Encounter for immunization: Secondary | ICD-10-CM

## 2016-10-30 DIAGNOSIS — E1065 Type 1 diabetes mellitus with hyperglycemia: Secondary | ICD-10-CM | POA: Diagnosis not present

## 2016-10-30 DIAGNOSIS — E1040 Type 1 diabetes mellitus with diabetic neuropathy, unspecified: Secondary | ICD-10-CM | POA: Diagnosis not present

## 2016-10-30 DIAGNOSIS — H209 Unspecified iridocyclitis: Secondary | ICD-10-CM | POA: Diagnosis not present

## 2016-10-30 DIAGNOSIS — Z961 Presence of intraocular lens: Secondary | ICD-10-CM | POA: Diagnosis not present

## 2016-10-30 DIAGNOSIS — H4043X3 Glaucoma secondary to eye inflammation, bilateral, severe stage: Secondary | ICD-10-CM | POA: Diagnosis not present

## 2016-10-30 DIAGNOSIS — IMO0002 Reserved for concepts with insufficient information to code with codable children: Secondary | ICD-10-CM

## 2016-10-30 NOTE — Patient Instructions (Signed)
Thank you for coming in today. Make sure to give yourself 10 units of lantus.  Make sure that is not what you already did.  Change the site of the pump and change the tubing.  Get labs today.  Recheck with me tomorrow.  We are trying to keep you out of the hospital.   Sore throat I think is a virus.  Take tylenol up to 1000mg  every 6 hours for pain as needed.

## 2016-10-30 NOTE — Progress Notes (Signed)
Carmen Cooper is a 76 y.o. female who presents to [facility] today for hyperglycemia. Patient has brittle diabetes managed with insulin pump. She's on a basal rate of 0.6 units per hour.  She changed the tubing and site yesterday and notes elevated blood sugars. She notes her sugar is around 500. She has a mild sore throat but otherwise feels well with no fevers or chills vomiting or diarrhea.    Past Medical History:  Diagnosis Date  . Diabetes ([place])   . Glaucoma   . Hypertension   . Thyroid disease    No past surgical history on file. Social History  Substance Use Topics  . Smoking status: Current Every Day Smoker  . Smokeless tobacco: Never Used  . Alcohol use Yes     Comment: 2-3 a day   family history is not on file.  ROS as above:  Medications: Current Outpatient Prescriptions  Medication Sig Dispense Refill  . AMBULATORY NON FORMULARY MEDICATION Freestyle light test strips Test twice a day  Dx type 2 diabetes E11.9 100 each 11  . amLODipine (NORVASC) 10 MG tablet TAKE 1 TABLET DAILY DUE FOR FOLLOW UP VISIT 90 tablet 1  . aspirin EC 81 MG tablet Take by mouth.    Marland Kitchen atorvastatin (LIPITOR) 40 MG tablet Take 1 tablet daily. 90 tablet 1  . benazepril-hydrochlorthiazide (LOTENSIN HCT) 20-12.5 MG tablet TAKE 1 TABLET DAILY 90 tablet 1  . dicyclomine (BENTYL) 10 MG capsule Take 1 capsule (10 mg total) by mouth 3 (three) times daily as needed for spasms. 180 capsule 1  . Dorzolamide HCl-Timolol Mal PF 22.3-6.8 MG/ML SOLN Apply to eye.    Marland Kitchen FREESTYLE LITE test strip     . hypromellose (GENTEAL) 0.3 % GEL ophthalmic ointment Apply to eye.    . Insulin Glargine (LANTUS SOLOSTAR) 100 UNIT/ML Solostar Pen Inject 20 Units into the skin at bedtime. 5 pen PRN  . insulin lispro (HUMALOG) 100 UNIT/ML KiwkPen 6 Units SQ with meals.  If glucose 150-200 add 1 unit, 201-250  add 2 units, etc. 15 mL 5  . levothyroxine (SYNTHROID, LEVOTHROID) 112 MCG tablet TAKE 1 TABLET DAILY 90 tablet 0  . metroNIDAZOLE (METROGEL) 0.75 % gel Apply 1 application topically 2 (two) times daily. 45 g 3  . multivitamin-iron-minerals-folic acid (CENTRUM) chewable tablet Chew by mouth.    . Omega-3 1000 MG CAPS Take by mouth.    . prednisoLONE acetate (PRED FORTE) 1 % ophthalmic suspension     . ZIOPTAN 0.0015 % SOLN      No current facility-administered medications for this visit.    Allergies  Allergen Reactions  . Clindamycin/Lincomycin Rash  . Sulfa Antibiotics Rash  . Valacyclovir Hcl Rash    Health Maintenance Health Maintenance  Topic Date Due  . INFLUENZA VACCINE  09/13/2016  . OPHTHALMOLOGY EXAM  09/14/2016  . FOOT EXAM  10/21/2016  . PNA vac Low Risk Adult (2 of 2 - PPSV23) 10/21/2016  . HEMOGLOBIN A1C  11/28/2016  . TETANUS/TDAP  07/25/2025  . DEXA SCAN  Completed     Exam:  BP 132/75   Pulse 86   Temp 98.2 F (36.8 C) (Oral)   Ht 5\' 5"  (1.651 m)   Wt 156 lb (70.8 kg)   SpO2 97%   BMI 25.96 kg/m  Gen: Well NAD, non-toxic appearing HEENT: EOMI,  MMM Posterior pharynx mildly erythematous no cervical lymphadenopathy Lungs: Normal work of breathing.  CTABL Heart: RRR no MRG Abd: NABS, Soft. Nondistended, Nontender Exts: Brisk capillary refill, warm and well perfused.  Skin: Insulin pump site is well-appearing no erythema.   No results found for this or any previous visit (from the past 72 hour(s)).     Assessment and Plan: 76 y.o. female with Hyperglycemia. Unclear if this is due to potential mild viral URI for insulin pump site issue. Plan to change the tubing and site and give 10 units of basal insulin. Additionally we'll check CBC and metabolic panel and return to clinic on the 18th for recheck and reevaluation.   Influenza vaccine given today  No orders of the defined types were placed in this encounter.  No orders of the defined types  were placed in this encounter.    Discussed warning signs or symptoms. Please see discharge instructions. Patient expresses understanding.  I spent 25 minutes with this patient, greater than 50% was face-to-face time counseling regarding differential diagnosis and treatment plan.Marland Kitchen

## 2016-10-31 ENCOUNTER — Encounter: Payer: Self-pay | Admitting: Family Medicine

## 2016-10-31 ENCOUNTER — Ambulatory Visit (INDEPENDENT_AMBULATORY_CARE_PROVIDER_SITE_OTHER): Payer: Medicare Other | Admitting: Family Medicine

## 2016-10-31 VITALS — BP 108/52 | HR 52 | Wt 155.0 lb

## 2016-10-31 DIAGNOSIS — E1065 Type 1 diabetes mellitus with hyperglycemia: Secondary | ICD-10-CM

## 2016-10-31 DIAGNOSIS — IMO0002 Reserved for concepts with insufficient information to code with codable children: Secondary | ICD-10-CM

## 2016-10-31 DIAGNOSIS — E1040 Type 1 diabetes mellitus with diabetic neuropathy, unspecified: Secondary | ICD-10-CM

## 2016-10-31 LAB — COMPLETE METABOLIC PANEL WITH GFR
AG Ratio: 1.4 (calc) (ref 1.0–2.5)
ALBUMIN MSPROF: 4.2 g/dL (ref 3.6–5.1)
ALKALINE PHOSPHATASE (APISO): 71 U/L (ref 33–130)
ALT: 22 U/L (ref 6–29)
AST: 19 U/L (ref 10–35)
BILIRUBIN TOTAL: 0.8 mg/dL (ref 0.2–1.2)
BUN / CREAT RATIO: 30 (calc) — AB (ref 6–22)
BUN: 39 mg/dL — AB (ref 7–25)
CHLORIDE: 93 mmol/L — AB (ref 98–110)
CO2: 24 mmol/L (ref 20–32)
CREATININE: 1.32 mg/dL — AB (ref 0.60–0.93)
Calcium: 9.8 mg/dL (ref 8.6–10.4)
GFR, EST AFRICAN AMERICAN: 45 mL/min/{1.73_m2} — AB (ref >=60)
GFR, Est Non African American: 39 mL/min/{1.73_m2} — ABNORMAL LOW (ref >=60)
GLOBULIN: 2.9 g/dL (ref 1.9–3.7)
GLUCOSE: 546 mg/dL — AB (ref 65–99)
Potassium: 4.1 mmol/L (ref 3.5–5.3)
SODIUM: 131 mmol/L — AB (ref 135–146)
TOTAL PROTEIN: 7.1 g/dL (ref 6.1–8.1)

## 2016-10-31 LAB — CBC
HEMATOCRIT: 42.1 % (ref 35.0–45.0)
Hemoglobin: 13.9 g/dL (ref 11.7–15.5)
MCH: 30.6 pg (ref 27.0–33.0)
MCHC: 33 g/dL (ref 32.0–36.0)
MCV: 92.7 fL (ref 80.0–100.0)
MPV: 10.7 fL (ref 7.5–12.5)
Platelets: 289 10*3/uL (ref 140–400)
RBC: 4.54 10*6/uL (ref 3.80–5.10)
RDW: 12 % (ref 11.0–15.0)
WBC: 7.8 10*3/uL (ref 3.8–10.8)

## 2016-10-31 NOTE — Patient Instructions (Signed)
Thank you for coming in today. Cotinue tylenol for fever or chills.  Follow up with Dr Hartford Poli soon.  Recheck as needed sooner.

## 2016-10-31 NOTE — Progress Notes (Signed)
Carmen Cooper is a 76 y.o. female who presents to [place]: [place] today for follow-up hyperglycemia. Patient was seen yesterday for significant hyperglycemia such be related to either site malfunction from insulin pump or viral URI potentially both. In the interim she notes her sugars have been doing better in the 200s now. She denies severe polyuria or polydipsia.    Past Medical History:  Diagnosis Date  . Diabetes ([place])   . Glaucoma   . Hypertension   . Thyroid disease    No past surgical history on file. Social History  Substance Use Topics  . Smoking status: Current Every Day Smoker  . Smokeless tobacco: Never Used  . Alcohol use Yes     Comment: 2-3 a day   family history is not on file.  ROS as above:  Medications: Current Outpatient Prescriptions  Medication Sig Dispense Refill  . AMBULATORY NON FORMULARY MEDICATION Freestyle light test strips Test twice a day  Dx type 2 diabetes E11.9 100 each 11  . amLODipine (NORVASC) 10 MG tablet TAKE 1 TABLET DAILY DUE FOR FOLLOW UP VISIT 90 tablet 1  . aspirin EC 81 MG tablet Take by mouth.    Marland Kitchen atorvastatin (LIPITOR) 40 MG tablet Take 1 tablet daily. 90 tablet 1  . benazepril-hydrochlorthiazide (LOTENSIN HCT) 20-12.5 MG tablet TAKE 1 TABLET DAILY 90 tablet 1  . dicyclomine (BENTYL) 10 MG capsule Take 1 capsule (10 mg total) by mouth 3 (three) times daily as needed for spasms. 180 capsule 1  . Dorzolamide HCl-Timolol Mal PF 22.3-6.8 MG/ML SOLN Apply to eye.    Marland Kitchen FREESTYLE LITE test strip     . hypromellose (GENTEAL) 0.3 % GEL ophthalmic ointment Apply to eye.    . Insulin Glargine (LANTUS SOLOSTAR) 100 UNIT/ML Solostar Pen Inject 20 Units into the skin at bedtime. 5 pen PRN  . insulin lispro (HUMALOG) 100 UNIT/ML injection Medtronic 630G pump.  Basal 12-6a 0.625, 6a-7p 0.725, 7p-12a 0.625.  Preset bolus:  4/5/6.   ISF 50.  Total daily dose:  40 units/day    . levothyroxine (SYNTHROID, LEVOTHROID) 112 MCG tablet TAKE 1 TABLET DAILY 90 tablet 0  . metroNIDAZOLE (METROGEL) 0.75 % gel Apply 1 application topically 2 (two) times daily. 45 g 3  . multivitamin-iron-minerals-folic acid (CENTRUM) chewable tablet Chew by mouth.    . Omega-3 1000 MG CAPS Take by mouth.    . prednisoLONE acetate (PRED FORTE) 1 % ophthalmic suspension     . ZIOPTAN 0.0015 % SOLN      No current facility-administered medications for this visit.    Allergies  Allergen Reactions  . Clindamycin/Lincomycin Rash  . Sulfa Antibiotics Rash  . Valacyclovir Hcl Rash    Health Maintenance Health Maintenance  Topic Date Due  . OPHTHALMOLOGY EXAM  09/14/2016  . FOOT EXAM  10/21/2016  . PNA vac Low Risk Adult (2 of 2 - PPSV23) 10/21/2016  . HEMOGLOBIN A1C  11/28/2016  . TETANUS/TDAP  07/25/2025  . INFLUENZA VACCINE  Completed  . DEXA SCAN  Completed     Exam:  BP (!) 108/52   Pulse (!) 52   Wt 155 lb (70.3 kg)   BMI 25.79 kg/m  Gen: Well NAD HEENT: EOMI,  MMM Lungs: Normal work of breathing. CTABL Heart: RRR no MRG Abd: NABS, Soft. Nondistended, Nontender Exts: Brisk capillary refill, warm and well perfused.    Results for orders placed or performed in visit on  10/30/16 (from the past 72 hour(s))  CBC     Status: None   Collection Time: 10/30/16  3:08 PM  Result Value Ref Range   WBC 7.8 3.8 - 10.8 Thousand/uL   RBC 4.54 3.80 - 5.10 Million/uL   Hemoglobin 13.9 11.7 - 15.5 g/dL   HCT 42.1 35.0 - 45.0 %   MCV 92.7 80.0 - 100.0 fL   MCH 30.6 27.0 - 33.0 pg   MCHC 33.0 32.0 - 36.0 g/dL   RDW 12.0 11.0 - 15.0 %   Platelets 289 140 - 400 Thousand/uL   MPV 10.7 7.5 - 12.5 fL  COMPLETE METABOLIC PANEL WITH GFR     Status: Abnormal   Collection Time: 10/30/16  3:08 PM  Result Value Ref Range   Glucose, Bld 546 (HH) 65 - 99 mg/dL    Comment: .            Fasting reference interval . For someone without known  diabetes, a glucose value >125 mg/dL indicates that they may have diabetes and this should be confirmed with a follow-up test. .    BUN 39 (H) 7 - 25 mg/dL   Creat 1.32 (H) 0.60 - 0.93 mg/dL    Comment: For patients >17 years of age, the reference limit for Creatinine is approximately 13% higher for people identified as African-American. .    GFR, Est Non African American 39 (L) > OR = 60 mL/min/1.58m2   GFR, Est African American 45 (L) > OR = 60 mL/min/1.62m2   BUN/Creatinine Ratio 30 (H) 6 - 22 (calc)   Sodium 131 (L) 135 - 146 mmol/L   Potassium 4.1 3.5 - 5.3 mmol/L   Chloride 93 (L) 98 - 110 mmol/L   CO2 24 20 - 32 mmol/L   Calcium 9.8 8.6 - 10.4 mg/dL   Total Protein 7.1 6.1 - 8.1 g/dL   Albumin 4.2 3.6 - 5.1 g/dL   Globulin 2.9 1.9 - 3.7 g/dL (calc)   AG Ratio 1.4 1.0 - 2.5 (calc)   Total Bilirubin 0.8 0.2 - 1.2 mg/dL   Alkaline phosphatase (APISO) 71 33 - 130 U/L   AST 19 10 - 35 U/L   ALT 22 6 - 29 U/L   No results found.    Assessment and Plan: 76 y.o. female with Improved hyperglycemia. No DKA today. Plan to continue current diabetes regimen and follow-up with endocrinology in the near future. Return sooner if needed.   No orders of the defined types were placed in this encounter.  No orders of the defined types were placed in this encounter.    Discussed warning signs or symptoms. Please see discharge instructions. Patient expresses understanding.

## 2016-11-21 DIAGNOSIS — E039 Hypothyroidism, unspecified: Secondary | ICD-10-CM | POA: Diagnosis not present

## 2016-11-21 LAB — HEMOGLOBIN A1C: HEMOGLOBIN A1C: 7.8

## 2016-11-21 LAB — TSH: TSH: 22.22 — AB (ref 0.41–5.90)

## 2016-11-27 DIAGNOSIS — H4063X2 Glaucoma secondary to drugs, bilateral, moderate stage: Secondary | ICD-10-CM | POA: Diagnosis not present

## 2016-11-27 DIAGNOSIS — H04123 Dry eye syndrome of bilateral lacrimal glands: Secondary | ICD-10-CM | POA: Diagnosis not present

## 2016-11-27 DIAGNOSIS — H52221 Regular astigmatism, right eye: Secondary | ICD-10-CM | POA: Diagnosis not present

## 2016-11-27 DIAGNOSIS — H5201 Hypermetropia, right eye: Secondary | ICD-10-CM | POA: Diagnosis not present

## 2016-11-27 DIAGNOSIS — H43812 Vitreous degeneration, left eye: Secondary | ICD-10-CM | POA: Diagnosis not present

## 2016-11-27 DIAGNOSIS — H43392 Other vitreous opacities, left eye: Secondary | ICD-10-CM | POA: Diagnosis not present

## 2017-01-05 DIAGNOSIS — Z961 Presence of intraocular lens: Secondary | ICD-10-CM | POA: Diagnosis not present

## 2017-01-05 DIAGNOSIS — H209 Unspecified iridocyclitis: Secondary | ICD-10-CM | POA: Diagnosis not present

## 2017-01-05 DIAGNOSIS — H4041X1 Glaucoma secondary to eye inflammation, right eye, mild stage: Secondary | ICD-10-CM | POA: Diagnosis not present

## 2017-01-05 DIAGNOSIS — H3581 Retinal edema: Secondary | ICD-10-CM | POA: Diagnosis not present

## 2017-01-05 DIAGNOSIS — Z9841 Cataract extraction status, right eye: Secondary | ICD-10-CM | POA: Diagnosis not present

## 2017-01-05 DIAGNOSIS — H30031 Focal chorioretinal inflammation, peripheral, right eye: Secondary | ICD-10-CM | POA: Diagnosis not present

## 2017-01-05 DIAGNOSIS — H4601 Optic papillitis, right eye: Secondary | ICD-10-CM | POA: Diagnosis not present

## 2017-01-05 DIAGNOSIS — Z79899 Other long term (current) drug therapy: Secondary | ICD-10-CM | POA: Diagnosis not present

## 2017-01-05 DIAGNOSIS — Z9842 Cataract extraction status, left eye: Secondary | ICD-10-CM | POA: Diagnosis not present

## 2017-01-05 LAB — HM DIABETES EYE EXAM

## 2017-01-09 DIAGNOSIS — E785 Hyperlipidemia, unspecified: Secondary | ICD-10-CM | POA: Diagnosis not present

## 2017-01-09 DIAGNOSIS — E1049 Type 1 diabetes mellitus with other diabetic neurological complication: Secondary | ICD-10-CM | POA: Diagnosis not present

## 2017-01-09 DIAGNOSIS — K589 Irritable bowel syndrome without diarrhea: Secondary | ICD-10-CM | POA: Diagnosis not present

## 2017-01-09 DIAGNOSIS — E1021 Type 1 diabetes mellitus with diabetic nephropathy: Secondary | ICD-10-CM | POA: Diagnosis not present

## 2017-01-09 DIAGNOSIS — E1065 Type 1 diabetes mellitus with hyperglycemia: Secondary | ICD-10-CM | POA: Diagnosis not present

## 2017-01-09 DIAGNOSIS — E1069 Type 1 diabetes mellitus with other specified complication: Secondary | ICD-10-CM | POA: Diagnosis not present

## 2017-01-09 DIAGNOSIS — I10 Essential (primary) hypertension: Secondary | ICD-10-CM | POA: Diagnosis not present

## 2017-01-09 DIAGNOSIS — E039 Hypothyroidism, unspecified: Secondary | ICD-10-CM | POA: Diagnosis not present

## 2017-01-26 ENCOUNTER — Encounter: Payer: Self-pay | Admitting: Family Medicine

## 2017-01-26 LAB — T4, FREE: FREE T4: 1.44

## 2017-01-29 DIAGNOSIS — Z794 Long term (current) use of insulin: Secondary | ICD-10-CM | POA: Diagnosis not present

## 2017-01-29 DIAGNOSIS — H401120 Primary open-angle glaucoma, left eye, stage unspecified: Secondary | ICD-10-CM | POA: Diagnosis not present

## 2017-01-29 DIAGNOSIS — Z961 Presence of intraocular lens: Secondary | ICD-10-CM | POA: Diagnosis not present

## 2017-01-29 DIAGNOSIS — I10 Essential (primary) hypertension: Secondary | ICD-10-CM | POA: Diagnosis not present

## 2017-01-29 DIAGNOSIS — Z9842 Cataract extraction status, left eye: Secondary | ICD-10-CM | POA: Diagnosis not present

## 2017-01-29 DIAGNOSIS — H30031 Focal chorioretinal inflammation, peripheral, right eye: Secondary | ICD-10-CM | POA: Diagnosis not present

## 2017-01-29 DIAGNOSIS — Z881 Allergy status to other antibiotic agents status: Secondary | ICD-10-CM | POA: Diagnosis not present

## 2017-01-29 DIAGNOSIS — Z8673 Personal history of transient ischemic attack (TIA), and cerebral infarction without residual deficits: Secondary | ICD-10-CM | POA: Diagnosis not present

## 2017-01-29 DIAGNOSIS — E7800 Pure hypercholesterolemia, unspecified: Secondary | ICD-10-CM | POA: Diagnosis not present

## 2017-01-29 DIAGNOSIS — Z7982 Long term (current) use of aspirin: Secondary | ICD-10-CM | POA: Diagnosis not present

## 2017-01-29 DIAGNOSIS — Z9841 Cataract extraction status, right eye: Secondary | ICD-10-CM | POA: Diagnosis not present

## 2017-01-29 DIAGNOSIS — E039 Hypothyroidism, unspecified: Secondary | ICD-10-CM | POA: Diagnosis not present

## 2017-01-29 DIAGNOSIS — Z9889 Other specified postprocedural states: Secondary | ICD-10-CM | POA: Diagnosis not present

## 2017-01-29 DIAGNOSIS — Z79899 Other long term (current) drug therapy: Secondary | ICD-10-CM | POA: Diagnosis not present

## 2017-01-29 DIAGNOSIS — Z888 Allergy status to other drugs, medicaments and biological substances status: Secondary | ICD-10-CM | POA: Diagnosis not present

## 2017-01-29 DIAGNOSIS — H209 Unspecified iridocyclitis: Secondary | ICD-10-CM | POA: Diagnosis not present

## 2017-01-29 DIAGNOSIS — Z882 Allergy status to sulfonamides status: Secondary | ICD-10-CM | POA: Diagnosis not present

## 2017-01-29 DIAGNOSIS — F17210 Nicotine dependence, cigarettes, uncomplicated: Secondary | ICD-10-CM | POA: Diagnosis not present

## 2017-01-29 DIAGNOSIS — H3581 Retinal edema: Secondary | ICD-10-CM | POA: Diagnosis not present

## 2017-01-29 DIAGNOSIS — E11311 Type 2 diabetes mellitus with unspecified diabetic retinopathy with macular edema: Secondary | ICD-10-CM | POA: Diagnosis not present

## 2017-01-29 DIAGNOSIS — H4041X1 Glaucoma secondary to eye inflammation, right eye, mild stage: Secondary | ICD-10-CM | POA: Diagnosis not present

## 2017-02-13 HISTORY — PX: MOHS SURGERY: SUR867

## 2017-02-24 ENCOUNTER — Other Ambulatory Visit: Payer: Self-pay | Admitting: Family Medicine

## 2017-02-24 DIAGNOSIS — I10 Essential (primary) hypertension: Secondary | ICD-10-CM

## 2017-03-01 DIAGNOSIS — H4041X1 Glaucoma secondary to eye inflammation, right eye, mild stage: Secondary | ICD-10-CM | POA: Diagnosis not present

## 2017-03-01 DIAGNOSIS — H3581 Retinal edema: Secondary | ICD-10-CM | POA: Diagnosis not present

## 2017-03-01 DIAGNOSIS — H401120 Primary open-angle glaucoma, left eye, stage unspecified: Secondary | ICD-10-CM | POA: Diagnosis not present

## 2017-03-01 DIAGNOSIS — Z961 Presence of intraocular lens: Secondary | ICD-10-CM | POA: Diagnosis not present

## 2017-03-01 DIAGNOSIS — H209 Unspecified iridocyclitis: Secondary | ICD-10-CM | POA: Diagnosis not present

## 2017-03-01 DIAGNOSIS — H30031 Focal chorioretinal inflammation, peripheral, right eye: Secondary | ICD-10-CM | POA: Diagnosis not present

## 2017-03-06 ENCOUNTER — Ambulatory Visit (INDEPENDENT_AMBULATORY_CARE_PROVIDER_SITE_OTHER): Payer: Medicare Other | Admitting: Family Medicine

## 2017-03-06 ENCOUNTER — Encounter: Payer: Self-pay | Admitting: Family Medicine

## 2017-03-06 VITALS — BP 111/68 | HR 71 | Wt 152.0 lb

## 2017-03-06 DIAGNOSIS — E1165 Type 2 diabetes mellitus with hyperglycemia: Secondary | ICD-10-CM | POA: Diagnosis not present

## 2017-03-06 DIAGNOSIS — J3489 Other specified disorders of nose and nasal sinuses: Secondary | ICD-10-CM | POA: Diagnosis not present

## 2017-03-06 DIAGNOSIS — Z23 Encounter for immunization: Secondary | ICD-10-CM | POA: Diagnosis not present

## 2017-03-06 DIAGNOSIS — E1149 Type 2 diabetes mellitus with other diabetic neurological complication: Secondary | ICD-10-CM

## 2017-03-06 DIAGNOSIS — L821 Other seborrheic keratosis: Secondary | ICD-10-CM

## 2017-03-06 DIAGNOSIS — IMO0002 Reserved for concepts with insufficient information to code with codable children: Secondary | ICD-10-CM

## 2017-03-06 LAB — POCT GLYCOSYLATED HEMOGLOBIN (HGB A1C): HEMOGLOBIN A1C: 8.5

## 2017-03-06 MED ORDER — MUPIROCIN 2 % EX OINT: TOPICAL_OINTMENT | Freq: Two times a day (BID) | CUTANEOUS | 3 refills | Status: DC

## 2017-03-06 MED ORDER — TRIAMCINOLONE ACETONIDE 0.5 % EX CREA: 1.0000 "application " | TOPICAL_CREAM | Freq: Two times a day (BID) | CUTANEOUS | 3 refills | Status: DC

## 2017-03-06 NOTE — Progress Notes (Signed)
Carmen Cooper is a 77 y.o. female who presents to [facility] [place] today for follow up T1DM. Patient also complaining of new rash and worsening of right eye symptoms.  T1DM: patient with fairly well controlled T1DM on insulin pump. Patient checks glucose 3 times per day. Range anywhere between 160 and 500+. 160 this morning. Patient endorsing no symptoms of hypoglycemia. No polyuria, polydipsia, or weight loss. Patient's son changes insulin pump every 3 days as she cannot read label.  Glaucoma: patient follow by opthalmology. Saw ophthalmologist last week due to worsening of pain and redness. Diagnosed with bacterial uveitis - oral abx should arrive in mail in coming days. Patient with follow up with glaucoma physician tomorrow.   Rash: patient with intermittent scaling gray rash on right lower extremity and lower back. Patient states that it is not itchy, but is bothersome.   Bump on nose. Carmen Cooper notes a small bump on her nose. This has been present for months. It is not getting better or worse and is not painful.    Past Medical History:  Diagnosis Date  . Diabetes ([place])   . Glaucoma   . Hypertension   . Thyroid disease    No past surgical history on file. Social History   Tobacco Use  . Smoking status: Current Every Day Smoker  . Smokeless tobacco: Never Used  Substance Use Topics  . Alcohol use: Yes    Comment: 2-3 a day   family history is not on file.  ROS as above:  Medications: Current Outpatient Medications  Medication Sig Dispense Refill  . AMBULATORY NON FORMULARY MEDICATION Freestyle light test strips Test twice a day  Dx type 2 diabetes E11.9 100 each 11  . amLODipine (NORVASC) 10 MG tablet TAKE 1 TABLET DAILY (DUE FOR FOLLOW UP VISIT) 90 tablet 1  . aspirin EC 81 MG tablet Take by mouth.    Marland Kitchen atorvastatin (LIPITOR) 40 MG tablet Take 1 tablet daily.  90 tablet 1  . benazepril-hydrochlorthiazide (LOTENSIN HCT) 20-12.5 MG tablet TAKE 1 TABLET DAILY 90 tablet 1  . dicyclomine (BENTYL) 10 MG capsule Take 1 capsule (10 mg total) by mouth 3 (three) times daily as needed for spasms. 180 capsule 1  . Dorzolamide HCl-Timolol Mal PF 22.3-6.8 MG/ML SOLN Apply to eye.    Marland Kitchen FREESTYLE LITE test strip     . hypromellose (GENTEAL) 0.3 % GEL ophthalmic ointment Apply to eye.    . Insulin Glargine (LANTUS SOLOSTAR) 100 UNIT/ML Solostar Pen Inject 20 Units into the skin at bedtime. 5 pen PRN  . insulin lispro (HUMALOG) 100 UNIT/ML injection Medtronic 630G pump.  Basal 12-6a 0.625, 6a-7p 0.725, 7p-12a 0.625.  Preset bolus:  4/5/6.  ISF 50.  Total daily dose:  40 units/day    . levothyroxine (SYNTHROID, LEVOTHROID) 112 MCG tablet TAKE 1 TABLET DAILY 90 tablet 0  . metroNIDAZOLE (METROGEL) 0.75 % gel Apply 1 application topically 2 (two) times daily. 45 g 3  . multivitamin-iron-minerals-folic acid (CENTRUM) chewable tablet Chew by mouth.    . Omega-3 1000 MG CAPS Take by mouth.    . prednisoLONE acetate (PRED FORTE) 1 % ophthalmic suspension     . ZIOPTAN 0.0015 % SOLN     . mupirocin ointment (BACTROBAN) 2 % Apply topically 2 (two) times daily. 30 g 3  . triamcinolone cream (KENALOG) 0.5 % Apply 1 application topically 2 (two) times daily. To affected areas. 30 g 3  No current facility-administered medications for this visit.    Allergies  Allergen Reactions  . Clindamycin/Lincomycin Rash  . Sulfa Antibiotics Rash  . Valacyclovir Hcl Rash    Health Maintenance Health Maintenance  Topic Date Due  . OPHTHALMOLOGY EXAM  09/14/2016  . HEMOGLOBIN A1C  09/03/2017  . FOOT EXAM  03/06/2018  . TETANUS/TDAP  07/25/2025  . INFLUENZA VACCINE  Completed  . DEXA SCAN  Completed  . PNA vac Low Risk Adult  Completed     Exam:  BP 111/68   Pulse 71   Wt 152 lb (68.9 kg)   BMI 25.29 kg/m  Gen: Well NAD HEENT: EOMI,  MMM Lungs: Normal work of breathing.  CTABL Heart: RRR no MRG Abd: NABS, Soft. Nondistended, Nontender Exts: Brisk capillary refill, warm and well perfused.  Skin: R. Lower extremity: single, 1 cm, raised, gray, minimally scaling rash. Tip of nose: flesh colored, minimally raised, circular rash on tip of nose. Non tender.  Media Information   Document Information   Photos    03/06/2017 10:47  Attached To:  Office Visit on 03/06/17 with Gregor Hams, MD  Source Information   Gregor Hams, MD  [facility]      Results for orders placed or performed in visit on 03/06/17 (from the past 72 hour(s))  POCT HgB A1C     Status: None   Collection Time: 03/06/17 10:19 AM  Result Value Ref Range   Hemoglobin A1C 8.5    No results found.    Assessment and Plan: 77 y.o. female with T1DM and glaucoma.  T1DM: A1C 8.5, up from 7.8 in October.Farily well controlled overall. Patient endorsing glucose measurements between 160 and 500+.   Eye: patient with glaucoma and recently diagnosed with uveitis. Patient followed closely by opthalmology. Nothing to change today.   Rash: consistent with seborrheic keratosis. Prescribed topical triamcinolone cream.   Tip of nose: patient with bothersome, small spot on tip of nose. Has increased in size slightly over last 6 months. Prescribed topical bactroban to try. Will pursue shave biopsy to rule out basal cell carcinoma in 2 months if not improving.   Received pneumococcal vaccine today.   Orders Placed This Encounter  Procedures  . Pneumococcal polysaccharide vaccine 23-valent greater than or equal to 2yo subcutaneous/IM  . POCT HgB A1C   Meds ordered this encounter  Medications  . triamcinolone cream (KENALOG) 0.5 %    Sig: Apply 1 application topically 2 (two) times daily. To affected areas.    Dispense:  30 g    Refill:  3  . mupirocin ointment (BACTROBAN) 2 %    Sig: Apply topically 2 (two) times daily.    Dispense:  30 g    Refill:  3    Discussed  warning signs or symptoms. Please see discharge instructions. Patient expresses understanding.

## 2017-03-06 NOTE — Patient Instructions (Addendum)
Thank you for coming in today. Apply triamcinolone cream or hydrocortisone cream to the irritated rash.  Use the mupirocin antibiotic ointment on the nose bump. If not better in 2 months we will do a shave biopsy.    Seborrheic Keratosis Seborrheic keratosis is a common, noncancerous (benign) skin growth. This condition causes waxy, rough, tan, brown, or black spots to appear on the skin. These skin growths can be flat or raised. What are the causes? The cause of this condition is not known. What increases the risk? This condition is more likely to develop in:  People who have a family history of seborrheic keratosis.  People who are 12 or older.  People who are pregnant.  People who have had estrogen replacement therapy.  What are the signs or symptoms? This condition often occurs on the face, chest, shoulders, back, or other areas. These growths:  Are usually painless, but may become irritated and itchy.  Can be yellow, brown, black, or other colors.  Are slightly raised or have a flat surface.  Are sometimes rough or wart-like in texture.  Are often waxy on the surface.  Are round or oval-shaped.  Sometimes look like they are "stuck on."  Often occur in groups, but may occur as a single growth.  How is this diagnosed? This condition is diagnosed with a medical history and physical exam. A sample of the growth may be tested (skin biopsy). You may need to see a skin specialist (dermatologist). How is this treated? Treatment is not usually needed for this condition, unless the growths are irritated or are often bleeding. You may also choose to have the growths removed if you do not like their appearance. Most commonly, these growths are treated with a procedure in which liquid nitrogen is applied to "freeze" off the growth (cryosurgery). They may also be burned off with electricity or cut off. Follow these instructions at home:  Watch your growth for any changes.  Keep  all follow-up visits as told by your health care provider. This is important.  Do not scratch or pick at the growth or growths. This can cause them to become irritated or infected. Contact a health care provider if:  You suddenly have many new growths.  Your growth bleeds, itches, or hurts.  Your growth suddenly becomes larger or changes color. This information is not intended to replace advice given to you by your health care provider. Make sure you discuss any questions you have with your health care provider. Document Released: 03/04/2010 Document Revised: 07/08/2015 Document Reviewed: 06/17/2014 Elsevier Interactive Patient Education  2018 [employer].

## 2017-03-07 ENCOUNTER — Ambulatory Visit: Payer: TRICARE For Life (TFL) | Admitting: Family Medicine

## 2017-03-08 DIAGNOSIS — H4043X3 Glaucoma secondary to eye inflammation, bilateral, severe stage: Secondary | ICD-10-CM | POA: Diagnosis not present

## 2017-03-08 DIAGNOSIS — H209 Unspecified iridocyclitis: Secondary | ICD-10-CM | POA: Diagnosis not present

## 2017-03-26 ENCOUNTER — Other Ambulatory Visit: Payer: Self-pay

## 2017-03-26 ENCOUNTER — Encounter: Payer: Self-pay | Admitting: *Deleted

## 2017-03-26 ENCOUNTER — Emergency Department (INDEPENDENT_AMBULATORY_CARE_PROVIDER_SITE_OTHER)
Admission: EM | Admit: 2017-03-26 | Discharge: 2017-03-26 | Disposition: A | Discharge status: Home / Self Care | Payer: Medicare Other | Source: Home / Self Care | Attending: Family Medicine | Admitting: Family Medicine

## 2017-03-26 DIAGNOSIS — Z720 Tobacco use: Secondary | ICD-10-CM | POA: Diagnosis not present

## 2017-03-26 DIAGNOSIS — R079 Chest pain, unspecified: Secondary | ICD-10-CM | POA: Diagnosis not present

## 2017-03-26 DIAGNOSIS — R112 Nausea with vomiting, unspecified: Secondary | ICD-10-CM

## 2017-03-26 DIAGNOSIS — E119 Type 2 diabetes mellitus without complications: Secondary | ICD-10-CM | POA: Diagnosis not present

## 2017-03-26 DIAGNOSIS — F17210 Nicotine dependence, cigarettes, uncomplicated: Secondary | ICD-10-CM | POA: Diagnosis present

## 2017-03-26 DIAGNOSIS — I9589 Other hypotension: Secondary | ICD-10-CM

## 2017-03-26 DIAGNOSIS — R0789 Other chest pain: Secondary | ICD-10-CM | POA: Diagnosis not present

## 2017-03-26 DIAGNOSIS — E039 Hypothyroidism, unspecified: Secondary | ICD-10-CM | POA: Diagnosis not present

## 2017-03-26 DIAGNOSIS — R1110 Vomiting, unspecified: Secondary | ICD-10-CM | POA: Diagnosis not present

## 2017-03-26 DIAGNOSIS — Z794 Long term (current) use of insulin: Secondary | ICD-10-CM | POA: Diagnosis not present

## 2017-03-26 DIAGNOSIS — E1065 Type 1 diabetes mellitus with hyperglycemia: Secondary | ICD-10-CM | POA: Diagnosis not present

## 2017-03-26 DIAGNOSIS — R197 Diarrhea, unspecified: Secondary | ICD-10-CM

## 2017-03-26 DIAGNOSIS — E1069 Type 1 diabetes mellitus with other specified complication: Secondary | ICD-10-CM | POA: Diagnosis not present

## 2017-03-26 DIAGNOSIS — Z8249 Family history of ischemic heart disease and other diseases of the circulatory system: Secondary | ICD-10-CM | POA: Diagnosis not present

## 2017-03-26 DIAGNOSIS — R739 Hyperglycemia, unspecified: Secondary | ICD-10-CM | POA: Diagnosis not present

## 2017-03-26 DIAGNOSIS — E1165 Type 2 diabetes mellitus with hyperglycemia: Secondary | ICD-10-CM | POA: Diagnosis not present

## 2017-03-26 DIAGNOSIS — E1049 Type 1 diabetes mellitus with other diabetic neurological complication: Secondary | ICD-10-CM | POA: Diagnosis not present

## 2017-03-26 DIAGNOSIS — I10 Essential (primary) hypertension: Secondary | ICD-10-CM | POA: Diagnosis not present

## 2017-03-26 DIAGNOSIS — E1010 Type 1 diabetes mellitus with ketoacidosis without coma: Secondary | ICD-10-CM | POA: Diagnosis not present

## 2017-03-26 DIAGNOSIS — E785 Hyperlipidemia, unspecified: Secondary | ICD-10-CM | POA: Diagnosis not present

## 2017-03-26 LAB — POCT FASTING CBG KUC MANUAL ENTRY: POCT GLUCOSE (MANUAL ENTRY) KUC: 539 mg/dL — AB (ref 70–99)

## 2017-03-26 NOTE — ED Triage Notes (Signed)
Pt reports BS 500 at home today at 0645. She is now dizzy, vomiting and feels like her heart is racing.

## 2017-03-27 LAB — LIPID PANEL
CHOLESTEROL: 96 (ref 0–200)
HDL: 57 (ref 35–70)
LDL CALC: 28
TRIGLYCERIDES: 75 (ref 40–160)

## 2017-03-27 LAB — HEMOGLOBIN A1C: Hemoglobin A1C: 8.1 % — AB (ref 4.0–5.6)

## 2017-03-27 MED ORDER — ASPIRIN EC 81 MG PO TBEC
81.00 | DELAYED_RELEASE_TABLET | ORAL | Status: DC
Start: 2017-03-29 — End: 2017-03-27

## 2017-03-27 MED ORDER — SODIUM CHLORIDE 0.9 % IV SOLN
INTRAVENOUS | Status: DC
Start: ? — End: 2017-03-27

## 2017-03-27 MED ORDER — POTASSIUM CHLORIDE CRYS ER 20 MEQ PO TBCR
EXTENDED_RELEASE_TABLET | ORAL | Status: DC
Start: ? — End: 2017-03-27

## 2017-03-27 MED ORDER — AMLODIPINE BESYLATE 10 MG PO TABS
10.00 | ORAL_TABLET | ORAL | Status: DC
Start: 2017-03-28 — End: 2017-03-27

## 2017-03-27 MED ORDER — KCL IN DEXTROSE-NACL 40-5-0.45 MEQ/L-%-% IV SOLN
INTRAVENOUS | Status: DC
Start: ? — End: 2017-03-27

## 2017-03-27 MED ORDER — GENERIC EXTERNAL MEDICATION
Status: DC
Start: ? — End: 2017-03-27

## 2017-03-27 MED ORDER — ATORVASTATIN CALCIUM 80 MG PO TABS
80.00 | ORAL_TABLET | ORAL | Status: DC
Start: 2017-03-28 — End: 2017-03-27

## 2017-03-27 MED ORDER — FISH OIL 1000 MG PO CAPS
ORAL_CAPSULE | ORAL | Status: DC
Start: 2017-03-29 — End: 2017-03-27

## 2017-03-27 MED ORDER — THERA PO TABS
ORAL_TABLET | ORAL | Status: DC
Start: 2017-03-29 — End: 2017-03-27

## 2017-03-27 MED ORDER — NITROGLYCERIN 0.4 MG SL SUBL
0.40 | SUBLINGUAL_TABLET | SUBLINGUAL | Status: DC
Start: ? — End: 2017-03-27

## 2017-03-27 MED ORDER — SODIUM CHLORIDE 0.9 % IV SOLN
30.00 | INTRAVENOUS | Status: DC
Start: ? — End: 2017-03-27

## 2017-03-27 MED ORDER — LEVOTHYROXINE SODIUM 112 MCG PO TABS
ORAL_TABLET | ORAL | Status: DC
Start: 2017-03-28 — End: 2017-03-27

## 2017-03-27 MED ORDER — SENNA-DOCUSATE SODIUM 8.6-50 MG PO TABS
ORAL_TABLET | ORAL | Status: DC
Start: ? — End: 2017-03-27

## 2017-03-27 MED ORDER — ENOXAPARIN SODIUM 40 MG/0.4ML ~~LOC~~ SOLN
40.00 | SUBCUTANEOUS | Status: DC
Start: 2017-03-28 — End: 2017-03-27

## 2017-03-27 MED ORDER — DORZOLAMIDE HCL-TIMOLOL MAL 22.3-6.8 MG/ML OP SOLN
OPHTHALMIC | Status: DC
Start: 2017-03-28 — End: 2017-03-27

## 2017-03-27 MED ORDER — ONDANSETRON HCL 4 MG PO TABS
4.00 | ORAL_TABLET | ORAL | Status: DC
Start: ? — End: 2017-03-27

## 2017-03-27 MED ORDER — POTASSIUM CHLORIDE 20 MEQ/15ML (10%) PO SOLN
ORAL | Status: DC
Start: ? — End: 2017-03-27

## 2017-03-27 MED ORDER — ONDANSETRON HCL 4 MG/2ML IJ SOLN
4.00 | INTRAMUSCULAR | Status: DC
Start: ? — End: 2017-03-27

## 2017-03-27 MED ORDER — MAGNESIUM OXIDE 400 MG PO TABS
800.00 | ORAL_TABLET | ORAL | Status: DC
Start: 2017-03-27 — End: 2017-03-27

## 2017-03-27 MED ORDER — GENERIC EXTERNAL MEDICATION
Status: DC
Start: 2017-03-28 — End: 2017-03-27

## 2017-03-28 MED ORDER — GENERIC EXTERNAL MEDICATION
Status: DC
Start: ? — End: 2017-03-28

## 2017-03-28 MED ORDER — INSULIN LISPRO 100 UNIT/ML ~~LOC~~ SOLN
SUBCUTANEOUS | Status: DC
Start: ? — End: 2017-03-28

## 2017-03-28 MED ORDER — INSULIN LISPRO 100 UNIT/ML ~~LOC~~ SOLN
SUBCUTANEOUS | Status: DC
Start: 2017-03-28 — End: 2017-03-28

## 2017-03-28 MED ORDER — INSULIN GLARGINE 100 UNIT/ML ~~LOC~~ SOLN
SUBCUTANEOUS | Status: DC
Start: 2017-03-28 — End: 2017-03-28

## 2017-03-29 ENCOUNTER — Telehealth: Payer: Self-pay | Admitting: *Deleted

## 2017-03-29 MED ORDER — GENERIC EXTERNAL MEDICATION
Status: DC
Start: ? — End: 2017-03-29

## 2017-03-29 NOTE — Telephone Encounter (Signed)
Spoke to pt she was d/c from the hospital yesterday and is feeling some better.

## 2017-03-30 MED ORDER — GENERIC EXTERNAL MEDICATION
Status: DC
Start: ? — End: 2017-03-30

## 2017-04-02 NOTE — ED Provider Notes (Signed)
[facility]    CSN: [id number] Arrival date & time: 03/26/17  1856     History   Chief Complaint Chief Complaint  Patient presents with  . Hyperglycemia    HPI Carmen Cooper is a 77 y.o. female.   Patient is diabetic and reports that her glucose increased to 500 last night, and elevated again this morning at 0645.  She uses an insulin pump.  She developed nausea/vomiting and diarrhea this morning and feels dizzy.  She feels like her heart is racing, but denies chest pain.  She noted left side abdominal pain today.    Hyperglycemia  Associated symptoms: abdominal pain, dizziness and fatigue   Associated symptoms: no chest pain and no diaphoresis     Past Medical History:  Diagnosis Date  . Diabetes ([place])   . Glaucoma   . Hypertension   . Thyroid disease     Patient Active Problem List   Diagnosis Date Noted  . Lesion of nose 03/06/2017  . Abdominal cramping 03/06/2016  . Fatigue 12/06/2015  . Uncontrolled diabetes mellitus with eye complications ([place]) 31/49/7026  . Uncontrolled diabetes mellitus with neurologic complication ([place]) 37/85/8850  . Uveitis 01/26/2015  . Venous stasis dermatitis 08/12/2014  . 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 Seborrheic keratoses 03/16/2014  . Edema 02/10/2014  . Dysphagia 02/10/2014  . Preventive measure 08/21/2013  . H/O left breast biopsy 08/21/2013  . Microscopic colitis 08/21/2013  . Hyperlipidemia 08/11/2013  . History of TIA (transient ischemic attack) 08/11/2013  . Essential hypertension, benign 08/11/2013  . Osteopenia 08/11/2013  . Hypothyroidism 08/11/2013  . Right knee pain 08/11/2013    History reviewed. No pertinent surgical history.  OB History    No data available       Home Medications    Prior to Admission medications   Medication Sig Start Date End Date Taking? Authorizing Provider  AMBULATORY NON FORMULARY MEDICATION Freestyle light test strips Test twice a day  Dx type 2 diabetes E11.9  01/14/14   Hommel, Hilliard Clark, DO  amLODipine (NORVASC) 10 MG tablet TAKE 1 TABLET DAILY (DUE FOR FOLLOW UP VISIT) 02/26/17   Gregor Hams, MD  aspirin EC 81 MG tablet Take by mouth.    [provider]  atorvastatin (LIPITOR) 40 MG tablet Take 1 tablet daily. 10/03/16   Gregor Hams, MD  benazepril-hydrochlorthiazide (LOTENSIN HCT) 20-12.5 MG tablet TAKE 1 TABLET DAILY 10/17/16   Gregor Hams, MD  dicyclomine (BENTYL) 10 MG capsule Take 1 capsule (10 mg total) by mouth 3 (three) times daily as needed for spasms. 09/04/16   Gregor Hams, MD  Dorzolamide HCl-Timolol Mal PF 22.3-6.8 MG/ML SOLN Apply to eye. 12/02/15   [provider]  FREESTYLE LITE test strip  01/08/16   [provider]  hypromellose (GENTEAL) 0.3 % GEL ophthalmic ointment Apply to eye.    [provider]  Insulin Glargine (LANTUS SOLOSTAR) 100 UNIT/ML Solostar Pen Inject 20 Units into the skin at bedtime. 11/27/13   Hommel, Hilliard Clark, DO  insulin lispro (HUMALOG) 100 UNIT/ML injection Medtronic 630G pump.  Basal 12-6a 0.625, 6a-7p 0.725, 7p-12a 0.625.  Preset bolus:  4/5/6.  ISF 50.  Total daily dose:  40 units/day 08/02/16   [provider]  levothyroxine (SYNTHROID, LEVOTHROID) 112 MCG tablet TAKE 1 TABLET DAILY 07/21/14   Hommel, Sean, DO  metroNIDAZOLE (METROGEL) 0.75 % gel Apply 1 application topically 2 (two) times daily. 09/04/16   Gregor Hams, MD  multivitamin-iron-minerals-folic acid (CENTRUM) chewable  tablet Chew by mouth.    [provider]  mupirocin ointment (BACTROBAN) 2 % Apply topically 2 (two) times daily. 03/06/17   Gregor Hams, MD  Omega-3 1000 MG CAPS Take by mouth.    [provider]  prednisoLONE acetate (PRED FORTE) 1 % ophthalmic suspension  02/23/16   [provider]  triamcinolone cream (KENALOG) 0.5 % Apply 1 application topically 2 (two) times daily. To affected areas. 03/06/17   Gregor Hams, MD  ZIOPTAN 0.0015 % SOLN  01/26/16   [provider]    Family History Family History  Problem Relation Age of Onset  . Diabetes Neg Hx     Social History Social History   Tobacco Use  . Smoking status: Current Every Day Smoker  . Smokeless tobacco: Never Used  Substance Use Topics  . Alcohol use: Yes    Comment: 2-3 a day  . Drug use: No     Allergies   Clindamycin/lincomycin; Sulfa antibiotics; and Valacyclovir hcl   Review of Systems Review of Systems  Constitutional: Positive for activity change, appetite change and fatigue. Negative for chills and diaphoresis.  HENT: Negative.   Eyes: Negative.   Respiratory: Negative.   Cardiovascular: Positive for palpitations. Negative for chest pain.  Gastrointestinal: Positive for abdominal pain.  Genitourinary: Negative.   Musculoskeletal: Negative.   Skin: Negative.   Neurological: Positive for dizziness and light-headedness.     Physical Exam Triage Vital Signs ED Triage Vitals  Enc Vitals Group     BP 03/26/17 0928 98/65     Pulse Rate 03/26/17 0928 88     Resp 03/26/17 0928 16     Temp 03/26/17 0928 (!) 97.4 F (36.3 C)     Temp Source 03/26/17 0928 Oral     SpO2 03/26/17 0928 97 %     Weight --      Height --      Head Circumference --      Peak Flow --      Pain Score 03/26/17 0930 0     Pain Loc --      Pain Edu? --      Excl. in [place]? --    No data found.  Updated Vital Signs BP 98/65 (BP Location: Right Arm)   Pulse 88   Temp (!) 97.4 F (36.3 C) (Oral)   Resp 16   SpO2 97%   Visual Acuity Right Eye Distance:   Left Eye Distance:   Bilateral Distance:    Right Eye Near:   Left Eye Near:    Bilateral Near:     Physical Exam Nursing notes and Vital Signs reviewed. Appearance:  Patient appears stated age, and in no acute distress but is unsteady with ambulation.  She is alert and oriented.    Eyes:  Pupils are equal, round, and reactive to light and accomodation.  Extraocular movement is intact.  Conjunctivae are not  inflamed   Pharynx:  Decreased moisture. Neck:  Supple.  No adenopathy Lungs:  Clear to auscultation.  Breath sounds are equal.  Moving air well. Heart:  Regular rate and rhythm without murmurs, rubs, or gallops.  Abdomen:  Vague diffuse tendeness. Extremities:  No edema.  Skin:  No rash present.     UC Treatments / Results  Labs (all labs ordered are listed, but only abnormal results are displayed) Labs Reviewed  POCT FASTING [facility] [place] - Abnormal; Notable for the following components:  Result Value   POCT Glucose ([facility]) 539 (*)    All other components within normal limits    EKG  EKG Interpretation None       Radiology No results found.  Procedures Procedures (including critical care time)  Medications Ordered in UC Medications - No data to display   Initial Impression / Assessment and Plan / UC Course  I have reviewed the triage vital signs and the nursing notes.  Pertinent labs & imaging results that were available during my care of the patient were reviewed by me and considered in my medical decision making (see chart for details).    Patient has multiple morbidities.  Will transport to local hospital ED by EMS for further evaluation/treatment.  Vital signs stable at discharge.    Final Clinical Impressions(s) / UC Diagnoses   Final diagnoses:  Hyperglycemia  Other specified hypotension  Nausea vomiting and diarrhea    ED Discharge Orders    None          Kandra Nicolas, MD 04/02/17 2152

## 2017-04-04 ENCOUNTER — Other Ambulatory Visit: Payer: Self-pay | Admitting: Family Medicine

## 2017-04-12 DIAGNOSIS — H44111 Panuveitis, right eye: Secondary | ICD-10-CM | POA: Diagnosis not present

## 2017-04-12 DIAGNOSIS — Z961 Presence of intraocular lens: Secondary | ICD-10-CM | POA: Diagnosis not present

## 2017-04-12 DIAGNOSIS — Z79899 Other long term (current) drug therapy: Secondary | ICD-10-CM | POA: Diagnosis not present

## 2017-04-12 DIAGNOSIS — H209 Unspecified iridocyclitis: Secondary | ICD-10-CM | POA: Diagnosis not present

## 2017-04-12 DIAGNOSIS — H401120 Primary open-angle glaucoma, left eye, stage unspecified: Secondary | ICD-10-CM | POA: Diagnosis not present

## 2017-04-12 DIAGNOSIS — H30031 Focal chorioretinal inflammation, peripheral, right eye: Secondary | ICD-10-CM | POA: Diagnosis not present

## 2017-04-12 DIAGNOSIS — H3581 Retinal edema: Secondary | ICD-10-CM | POA: Diagnosis not present

## 2017-04-12 DIAGNOSIS — H4041X1 Glaucoma secondary to eye inflammation, right eye, mild stage: Secondary | ICD-10-CM | POA: Diagnosis not present

## 2017-04-13 DIAGNOSIS — E039 Hypothyroidism, unspecified: Secondary | ICD-10-CM | POA: Diagnosis not present

## 2017-04-17 DIAGNOSIS — E1069 Type 1 diabetes mellitus with other specified complication: Secondary | ICD-10-CM | POA: Diagnosis not present

## 2017-04-17 DIAGNOSIS — E039 Hypothyroidism, unspecified: Secondary | ICD-10-CM | POA: Diagnosis not present

## 2017-04-17 DIAGNOSIS — E1065 Type 1 diabetes mellitus with hyperglycemia: Secondary | ICD-10-CM | POA: Diagnosis not present

## 2017-04-17 DIAGNOSIS — E1021 Type 1 diabetes mellitus with diabetic nephropathy: Secondary | ICD-10-CM | POA: Diagnosis not present

## 2017-04-17 DIAGNOSIS — E785 Hyperlipidemia, unspecified: Secondary | ICD-10-CM | POA: Diagnosis not present

## 2017-04-17 DIAGNOSIS — E1049 Type 1 diabetes mellitus with other diabetic neurological complication: Secondary | ICD-10-CM | POA: Diagnosis not present

## 2017-04-17 DIAGNOSIS — I10 Essential (primary) hypertension: Secondary | ICD-10-CM | POA: Diagnosis not present

## 2017-04-26 DIAGNOSIS — E1110 Type 2 diabetes mellitus with ketoacidosis without coma: Secondary | ICD-10-CM | POA: Diagnosis not present

## 2017-04-26 DIAGNOSIS — R739 Hyperglycemia, unspecified: Secondary | ICD-10-CM | POA: Diagnosis not present

## 2017-04-26 DIAGNOSIS — I10 Essential (primary) hypertension: Secondary | ICD-10-CM | POA: Diagnosis not present

## 2017-04-26 DIAGNOSIS — Z79899 Other long term (current) drug therapy: Secondary | ICD-10-CM | POA: Diagnosis not present

## 2017-04-26 DIAGNOSIS — Z794 Long term (current) use of insulin: Secondary | ICD-10-CM | POA: Diagnosis not present

## 2017-04-26 DIAGNOSIS — I519 Heart disease, unspecified: Secondary | ICD-10-CM | POA: Diagnosis not present

## 2017-04-26 DIAGNOSIS — E039 Hypothyroidism, unspecified: Secondary | ICD-10-CM | POA: Diagnosis not present

## 2017-04-26 DIAGNOSIS — Z7982 Long term (current) use of aspirin: Secondary | ICD-10-CM | POA: Diagnosis not present

## 2017-04-26 DIAGNOSIS — E785 Hyperlipidemia, unspecified: Secondary | ICD-10-CM | POA: Diagnosis present

## 2017-04-26 DIAGNOSIS — F17210 Nicotine dependence, cigarettes, uncomplicated: Secondary | ICD-10-CM | POA: Diagnosis present

## 2017-04-26 DIAGNOSIS — E1049 Type 1 diabetes mellitus with other diabetic neurological complication: Secondary | ICD-10-CM | POA: Diagnosis not present

## 2017-04-26 DIAGNOSIS — R0602 Shortness of breath: Secondary | ICD-10-CM | POA: Diagnosis not present

## 2017-04-26 DIAGNOSIS — Z9641 Presence of insulin pump (external) (internal): Secondary | ICD-10-CM | POA: Diagnosis present

## 2017-04-26 DIAGNOSIS — Z882 Allergy status to sulfonamides status: Secondary | ICD-10-CM | POA: Diagnosis not present

## 2017-04-26 DIAGNOSIS — Z888 Allergy status to other drugs, medicaments and biological substances status: Secondary | ICD-10-CM | POA: Diagnosis not present

## 2017-04-26 DIAGNOSIS — Z881 Allergy status to other antibiotic agents status: Secondary | ICD-10-CM | POA: Diagnosis not present

## 2017-04-26 DIAGNOSIS — I639 Cerebral infarction, unspecified: Secondary | ICD-10-CM | POA: Diagnosis not present

## 2017-04-26 DIAGNOSIS — Z8673 Personal history of transient ischemic attack (TIA), and cerebral infarction without residual deficits: Secondary | ICD-10-CM | POA: Diagnosis not present

## 2017-04-26 DIAGNOSIS — R079 Chest pain, unspecified: Secondary | ICD-10-CM | POA: Diagnosis not present

## 2017-04-26 DIAGNOSIS — E871 Hypo-osmolality and hyponatremia: Secondary | ICD-10-CM | POA: Diagnosis not present

## 2017-04-26 DIAGNOSIS — E1010 Type 1 diabetes mellitus with ketoacidosis without coma: Secondary | ICD-10-CM | POA: Diagnosis not present

## 2017-04-26 DIAGNOSIS — I082 Rheumatic disorders of both aortic and tricuspid valves: Secondary | ICD-10-CM | POA: Diagnosis not present

## 2017-04-26 DIAGNOSIS — I517 Cardiomegaly: Secondary | ICD-10-CM | POA: Diagnosis not present

## 2017-04-26 DIAGNOSIS — E1065 Type 1 diabetes mellitus with hyperglycemia: Secondary | ICD-10-CM | POA: Diagnosis not present

## 2017-04-26 DIAGNOSIS — N179 Acute kidney failure, unspecified: Secondary | ICD-10-CM | POA: Diagnosis present

## 2017-04-30 LAB — BASIC METABOLIC PANEL
BUN: 13 (ref 4–21)
Creatinine: 0.6 (ref 0.5–1.1)
Glucose: 141
Potassium: 4 (ref 3.4–5.3)
Sodium: 141 (ref 137–147)

## 2017-05-01 DIAGNOSIS — I10 Essential (primary) hypertension: Secondary | ICD-10-CM | POA: Diagnosis not present

## 2017-05-01 DIAGNOSIS — E1049 Type 1 diabetes mellitus with other diabetic neurological complication: Secondary | ICD-10-CM | POA: Diagnosis not present

## 2017-05-01 DIAGNOSIS — E1021 Type 1 diabetes mellitus with diabetic nephropathy: Secondary | ICD-10-CM | POA: Diagnosis not present

## 2017-05-01 DIAGNOSIS — E1065 Type 1 diabetes mellitus with hyperglycemia: Secondary | ICD-10-CM | POA: Diagnosis not present

## 2017-05-02 MED ORDER — GENERIC EXTERNAL MEDICATION
Status: DC
Start: ? — End: 2017-05-02

## 2017-05-02 MED ORDER — SODIUM CHLORIDE 0.9 % IV SOLN
INTRAVENOUS | Status: DC
Start: ? — End: 2017-05-02

## 2017-05-02 MED ORDER — ACETAMINOPHEN 325 MG PO TABS
650.00 | ORAL_TABLET | ORAL | Status: DC
Start: ? — End: 2017-05-02

## 2017-05-02 MED ORDER — CLOTRIMAZOLE-BETAMETHASONE 1-0.05 % EX CREA
TOPICAL_CREAM | CUTANEOUS | Status: DC
Start: 2017-04-30 — End: 2017-05-02

## 2017-05-02 MED ORDER — PANTOPRAZOLE SODIUM 40 MG PO TBEC
40.00 | DELAYED_RELEASE_TABLET | ORAL | Status: DC
Start: 2017-05-01 — End: 2017-05-02

## 2017-05-02 MED ORDER — PHOSPHOROUS 155-852-130 MG PO TABS
250.00 | ORAL_TABLET | ORAL | Status: DC
Start: 2017-04-30 — End: 2017-05-02

## 2017-05-02 MED ORDER — ENOXAPARIN SODIUM 40 MG/0.4ML ~~LOC~~ SOLN
40.00 | SUBCUTANEOUS | Status: DC
Start: 2017-05-01 — End: 2017-05-02

## 2017-05-02 MED ORDER — ASPIRIN EC 81 MG PO TBEC
81.00 | DELAYED_RELEASE_TABLET | ORAL | Status: DC
Start: 2017-05-01 — End: 2017-05-02

## 2017-05-02 MED ORDER — ONDANSETRON HCL 4 MG/2ML IJ SOLN
4.00 | INTRAMUSCULAR | Status: DC
Start: ? — End: 2017-05-02

## 2017-05-02 MED ORDER — ONDANSETRON HCL 4 MG/5ML PO SOLN
4.00 | ORAL | Status: DC
Start: ? — End: 2017-05-02

## 2017-05-02 MED ORDER — HYDRALAZINE HCL 20 MG/ML IJ SOLN
10.00 | INTRAMUSCULAR | Status: DC
Start: ? — End: 2017-05-02

## 2017-05-02 MED ORDER — LATANOPROST 0.005 % OP SOLN
OPHTHALMIC | Status: DC
Start: 2017-04-30 — End: 2017-05-02

## 2017-05-02 MED ORDER — LEVOTHYROXINE SODIUM 112 MCG PO TABS
ORAL_TABLET | ORAL | Status: DC
Start: 2017-05-01 — End: 2017-05-02

## 2017-05-02 MED ORDER — ONDANSETRON HCL 4 MG PO TABS
4.00 | ORAL_TABLET | ORAL | Status: DC
Start: ? — End: 2017-05-02

## 2017-05-02 MED ORDER — PREDNISOLONE ACETATE 1 % OP SUSP
OPHTHALMIC | Status: DC
Start: 2017-04-30 — End: 2017-05-02

## 2017-05-02 MED ORDER — KETOROLAC TROMETHAMINE 0.5 % OP SOLN
OPHTHALMIC | Status: DC
Start: 2017-04-30 — End: 2017-05-02

## 2017-05-02 MED ORDER — DORZOLAMIDE HCL-TIMOLOL MAL 22.3-6.8 MG/ML OP SOLN
OPHTHALMIC | Status: DC
Start: 2017-04-30 — End: 2017-05-02

## 2017-05-02 MED ORDER — ATORVASTATIN CALCIUM 40 MG PO TABS
40.00 | ORAL_TABLET | ORAL | Status: DC
Start: 2017-05-01 — End: 2017-05-02

## 2017-05-03 ENCOUNTER — Other Ambulatory Visit: Payer: Self-pay | Admitting: Family Medicine

## 2017-05-04 ENCOUNTER — Ambulatory Visit (INDEPENDENT_AMBULATORY_CARE_PROVIDER_SITE_OTHER): Payer: Medicare Other | Admitting: Family Medicine

## 2017-05-04 ENCOUNTER — Encounter: Payer: Self-pay | Admitting: Family Medicine

## 2017-05-04 VITALS — BP 98/61 | HR 68 | Ht 65.0 in | Wt 147.0 lb

## 2017-05-04 DIAGNOSIS — C44311 Basal cell carcinoma of skin of nose: Secondary | ICD-10-CM | POA: Diagnosis not present

## 2017-05-04 DIAGNOSIS — E1139 Type 2 diabetes mellitus with other diabetic ophthalmic complication: Secondary | ICD-10-CM | POA: Diagnosis not present

## 2017-05-04 DIAGNOSIS — J3489 Other specified disorders of nose and nasal sinuses: Secondary | ICD-10-CM

## 2017-05-04 DIAGNOSIS — IMO0002 Reserved for concepts with insufficient information to code with codable children: Secondary | ICD-10-CM

## 2017-05-04 DIAGNOSIS — E1165 Type 2 diabetes mellitus with hyperglycemia: Secondary | ICD-10-CM

## 2017-05-04 DIAGNOSIS — I10 Essential (primary) hypertension: Secondary | ICD-10-CM | POA: Diagnosis not present

## 2017-05-04 DIAGNOSIS — E1149 Type 2 diabetes mellitus with other diabetic neurological complication: Secondary | ICD-10-CM

## 2017-05-04 DIAGNOSIS — J349 Unspecified disorder of nose and nasal sinuses: Secondary | ICD-10-CM

## 2017-05-04 MED ORDER — ATORVASTATIN CALCIUM 40 MG PO TABS: 40.0000 mg | ORAL_TABLET | Freq: Every day | ORAL | 1 refills | Status: DC

## 2017-05-04 MED ORDER — AMLODIPINE BESYLATE 10 MG PO TABS: 10.0000 mg | ORAL_TABLET | Freq: Every day | ORAL | 1 refills | Status: DC

## 2017-05-04 MED ORDER — BENAZEPRIL-HYDROCHLOROTHIAZIDE 20-12.5 MG PO TABS: 1.0000 | ORAL_TABLET | Freq: Every day | ORAL | 1 refills | Status: DC

## 2017-05-04 NOTE — Patient Instructions (Addendum)
Thank you for coming in today. Keep the wound covered with ointment.  It is ok to get wet.  Recheck with me monthly for a little while.  I will get results to you likely next week.   Let me know if you need to talk about your husband. My wife can also get you to a second opinion about you husband.    Basal Cell Carcinoma Basal cell carcinoma is the most common form of skin cancer. It begins in the basal cells, which are at the bottom of the outer skin layer (epidermis). It occurs most often on parts of the body that are frequently exposed to the sun, such as:  Parts of the head, including the scalp or face.  Ears.  Neck.  Arms or legs.  Backs of the hands.  Basal cell carcinoma can almost always be cured. It rarely spreads to other areas of the body (metastasizes). Basal cell carcinoma may come back at the same location (recur), but it can be treated again if this occurs. What are the causes? This condition is usually caused by exposure to ultraviolet (UV) light. UV light may come from the sun or from tanning beds. Other causes include:  Exposure to arsenic.  Exposure to radiation.  Exposure to toxic tars and oils.  Certain genetic conditions, such as xeroderma pigmentosum.  What increases the risk? This condition is more likely to develop in:  People who are older than 78 years of age.  People who have fair skin (light complexion).  People who have blonde or red hair.  People who have blue, green, or gray eyes.  People who have childhood freckling.  People who have had sun exposure over long periods of time, especially during childhood.  People who have had repeated sunburns.  People who have a weakened immune system.  People who have been exposed to certain chemicals, such as tar, soot, and arsenic.  People who have chronic inflammatory conditions.  People who have chronic infections.  People who use tanning beds.  What are the signs or symptoms? The  main symptom of this condition is a growth or lesion on the skin. The shape and color of the growth or lesion may vary. The five main types include:  An open sore that may remain open for 3 weeks or longer. The sore may bleed or crust. This type of lesion can be an early sign of basal cell carcinoma. Basal cell carcinoma often shows up as a sore that does not heal.  A reddish area that may crust, itch, or cause discomfort. This may occur on areas that are exposed to the sun. These patches might be easier to feel than to see.  A shiny or clear bump that is red, white, or pink. In people who have dark hair, the bump is often tan, black, or brown. These bumps can look like moles.  A pink growth with a raised border. The growth will have a crusted and indented area in the center. Small blood vessels may appear on the surface of the growth as it gets bigger.  A scarlike area that looks like shiny, stretched skin. The area may be white, yellow, or waxy. It often has irregular borders. This may be a sign of more aggressive basal cell carcinoma.  How is this diagnosed? This condition may be diagnosed with:  A physical exam.  Removal of a tissue sample to be examined under a microscope (biopsy).  How is this treated? Treatment for this  condition involves removing the cancerous tissue. The method that is used for this depends on the type, size, location, and number of tumors. Possible treatments include:  Mohs surgery. In this procedure, the cancerous skin cells are removed layer by layer until all of the tumor has been removed.  Surgical removal (excision) of the tumor. This involves removing the entire tumor and a small amount of normal skin that surrounds it.  Cryosurgery. This involves freezing the tumor with liquid nitrogen.  Plastic surgery. The tumor is removed, and healthy skin from another part of the body is used to cover the wound. This may be done for large tumors that are in areas  where it is not possible to stretch the nearby skin to sew the edges of the wound together.  Radiation. This may be used for tumors on the face.  Photodynamic therapy. A chemical cream is applied to the skin, and light exposure is used to activate the chemical.  Electrodesiccation and curettage. This involves alternately scraping and burning the tumor while using an electric current to control bleeding.  Chemical treatments, such as imiquimod cream and interferon injections. These may be used to remove superficial tumors with minimal scarring.  Follow these instructions at home:  Avoid unprotected sun exposure.  Do self-exams as told by your health care provider. Look for new spots or changes in your skin.  Keep all follow-up visits as told by your health care provider. This is important. How is this prevented?  Avoid the sun when it is the strongest. This is usually between 10:00 a.m. and 4:00 p.m.  When you are out in the sun, use a sunscreen that has a sun protection factor (SPF) of at least 55.  Apply sunscreen at least 30 minutes before exposure to the sun.  Reapply sunscreen every 2-4 hours while you are outside. Also reapply it after swimming and after excessive sweating.  Always wear hats, protective clothing, and UV-blocking sunglasses when you are outdoors.  Do not use tanning beds. Contact a health care provider if:  You notice any new spots or any changes in your skin.  You have had a basal cell carcinoma tumor removed and you notice a new growth in the same location. This information is not intended to replace advice given to you by your health care provider. Make sure you discuss any questions you have with your health care provider. Document Released: 08/06/2002 Document Revised: 07/08/2015 Document Reviewed: 05/25/2014 Elsevier Interactive Patient Education  [employer].

## 2017-05-04 NOTE — Progress Notes (Signed)
Carmen Cooper is a 77 y.o. female who presents to [facility] today for lesion on nose, hypertension, diabetes.  Carmen Cooper notes a growing lesion or mass on the tip of her nose.  This is been growing slowly over the last 6 months.  I saw her for this problem in January 2019 and did a trial of topical mupirocin antibiotic ointment which made no difference.  She notes that it is not painful.  No fevers or chills or unexplained weight loss.  Hypertension: Carmen Cooper has hypertension that is managed with amlodipine, benazepril, and hydrochlorothiazide.  She denies chest pain or palpitations.  She notes that she needs refills of both the amlodipine and combo benazepril/hydrochlorothiazide.  Diabetes: Carmen Cooper continues to have severe difficult to control type 1 diabetes.  She is managed with endocrinology with an insulin pump.  She has been hospitalized twice with the last month for episodes of diabetic ketoacidosis.  She last followed up with her endocrinologist on March 19 who is trying to arrange for a tubeless insulin pump and continuous glucose monitor.  The biggest barrier is Carmen Cooper's need to fully understand what she notes is supposed to do with the pump and also her health insurance.  She notes that she will often have episodes of severe hyperglycemia with dehydration.  She is been to urgent care a few times with this and has been told to go to the emergency room.  Hypothyroidism: Carmen Cooper also has hypothyroidism managed with levothyroxine.  This is typically refilled by her endocrinologist.  She notes this needs a refill as well.   Past Medical History:  Diagnosis Date  . Diabetes ([place])   . Glaucoma   . Hypertension   . Thyroid disease    No past surgical history on file. Social History   Tobacco Use  . Smoking status: Current Every Day Smoker  . Smokeless tobacco: Never Used  Substance  Use Topics  . Alcohol use: Yes    Comment: 2-3 a day   family history is not on file.  ROS as above:  Medications: Current Outpatient Medications  Medication Sig Dispense Refill  . AMBULATORY NON FORMULARY MEDICATION Freestyle light test strips Test twice a day  Dx type 2 diabetes E11.9 100 each 11  . amLODipine (NORVASC) 10 MG tablet Take 1 tablet (10 mg total) by mouth daily. 90 tablet 1  . aspirin EC 81 MG tablet Take by mouth.    Marland Kitchen atorvastatin (LIPITOR) 40 MG tablet Take 1 tablet (40 mg total) by mouth daily. Due for lab work 90 tablet 1  . dicyclomine (BENTYL) 10 MG capsule Take 1 capsule (10 mg total) by mouth 3 (three) times daily as needed for spasms. 180 capsule 1  . Dorzolamide HCl-Timolol Mal PF 22.3-6.8 MG/ML SOLN Apply to eye.    Marland Kitchen FREESTYLE LITE test strip     . hypromellose (GENTEAL) 0.3 % GEL ophthalmic ointment Apply to eye.    . insulin lispro (HUMALOG) 100 UNIT/ML injection Medtronic 630G pump.  Basal 12-6a 0.625, 6a-7p 0.725, 7p-12a 0.625.  Preset bolus:  4/5/6.  ISF 50.  Total daily dose:  40 units/day    . levothyroxine (SYNTHROID, LEVOTHROID) 112 MCG tablet TAKE 1 TABLET DAILY 90 tablet 0  . methotrexate (RHEUMATREX) 2.5 MG tablet Take by mouth.    . metroNIDAZOLE (METROGEL) 0.75 % gel Apply 1 application topically 2 (two) times daily. 45 g 3  . multivitamin-iron-minerals-folic acid (CENTRUM) chewable tablet  Chew by mouth.    . mupirocin ointment (BACTROBAN) 2 % Apply topically 2 (two) times daily. 30 g 3  . nepafenac (NEVANAC) 0.1 % ophthalmic suspension Place 1 drop into the right eye 3 (three) times a day.    . Omega-3 1000 MG CAPS Take by mouth.    . prednisoLONE acetate (PRED FORTE) 1 % ophthalmic suspension     . triamcinolone cream (KENALOG) 0.5 % Apply 1 application topically 2 (two) times daily. To affected areas. 30 g 3  . ZIOPTAN 0.0015 % SOLN     . benazepril-hydrochlorthiazide (LOTENSIN HCT) 20-12.5 MG tablet Take 1 tablet by mouth daily. 90 tablet  1  . citalopram (CELEXA) 20 MG tablet Take by mouth.    . folic acid (FOLVITE) 1 MG tablet     . HUMIRA PEN 40 MG/0.4ML PNKT     . Multiple Vitamin (MULTI-VITAMINS) TABS Take by mouth.     No current facility-administered medications for this visit.    Allergies  Allergen Reactions  . Clindamycin/Lincomycin Rash  . Sulfa Antibiotics Rash  . Valacyclovir Hcl Rash    Health Maintenance Health Maintenance  Topic Date Due  . OPHTHALMOLOGY EXAM  09/14/2016  . HEMOGLOBIN A1C  09/03/2017  . FOOT EXAM  03/06/2018  . TETANUS/TDAP  07/25/2025  . INFLUENZA VACCINE  Completed  . DEXA SCAN  Completed  . PNA vac Low Risk Adult  Completed     Exam:  BP 98/61   Pulse 68   Ht 5\' 5"  (1.651 m)   Wt 147 lb (66.7 kg)   BMI 24.46 kg/m  Gen: Well NAD nontoxic appearing HEENT: EOMI,  MMM Lungs: Normal work of breathing. CTABL Heart: RRR no MRG Abd: NABS, Soft. Nondistended, Nontender Exts: Brisk capillary refill, warm and well perfused.  Skin: Small 0.5 cm round whitish nodule on the left side of the tip of the nose with mild central umbilication.  Nontender.  No scale.    No results found for this or any previous visit (from the past 72 hour(s)). No results found.    Assessment and Plan: 77 y.o. female with  Skin lesion: Concerning for basal cell carcinoma shave biopsy performed today.  Will inform patient of results when pathology is back.  Hypertension: Blood pressure at goal.  Continue current management.  We will abstract recent blood chemistry and lipid labs.  Medications refilled.  Diabetes: Continues to be difficult to control.  I agree with the plan from Dr. Sherian Rein in care everywhere at [facility]   Recheck in 1 month.  No orders of the defined types were placed in this encounter.  Meds ordered this encounter  Medications  . amLODipine (NORVASC) 10 MG tablet    Sig: Take 1 tablet (10 mg total) by mouth daily.    Dispense:  90 tablet    Refill:  1  . atorvastatin  (LIPITOR) 40 MG tablet    Sig: Take 1 tablet (40 mg total) by mouth daily. Due for lab work    Dispense:  90 tablet    Refill:  1  . benazepril-hydrochlorthiazide (LOTENSIN HCT) 20-12.5 MG tablet    Sig: Take 1 tablet by mouth daily.    Dispense:  90 tablet    Refill:  1     Discussed warning signs or symptoms. Please see discharge instructions. Patient expresses understanding.

## 2017-05-08 DIAGNOSIS — C44311 Basal cell carcinoma of skin of nose: Secondary | ICD-10-CM | POA: Insufficient documentation

## 2017-05-08 NOTE — Addendum Note (Signed)
Addended by: Gregor Hams on: 05/08/2017 07:02 AM   Modules accepted: Orders

## 2017-05-10 DIAGNOSIS — C44311 Basal cell carcinoma of skin of nose: Secondary | ICD-10-CM | POA: Diagnosis not present

## 2017-05-11 ENCOUNTER — Telehealth: Payer: Self-pay | Admitting: Family Medicine

## 2017-05-11 MED ORDER — LISINOPRIL-HYDROCHLOROTHIAZIDE 20-12.5 MG PO TABS: 1.0000 | ORAL_TABLET | Freq: Every day | ORAL | 3 refills | Status: DC

## 2017-05-11 NOTE — Telephone Encounter (Signed)
Patient has been informed. Maribeth Jiles,CMA  

## 2017-05-11 NOTE — Telephone Encounter (Signed)
Benazepril/HCTZ out of stock at [facility]. Switching to Lisinopril/HCTZ.  Medicine should be the same.  [facility]

## 2017-06-04 ENCOUNTER — Ambulatory Visit: Payer: Medicare Other | Admitting: Family Medicine

## 2017-06-04 DIAGNOSIS — Z961 Presence of intraocular lens: Secondary | ICD-10-CM | POA: Diagnosis not present

## 2017-06-04 DIAGNOSIS — H44111 Panuveitis, right eye: Secondary | ICD-10-CM | POA: Diagnosis not present

## 2017-06-04 DIAGNOSIS — H209 Unspecified iridocyclitis: Secondary | ICD-10-CM | POA: Diagnosis not present

## 2017-06-04 DIAGNOSIS — H4041X1 Glaucoma secondary to eye inflammation, right eye, mild stage: Secondary | ICD-10-CM | POA: Diagnosis not present

## 2017-06-04 DIAGNOSIS — H401120 Primary open-angle glaucoma, left eye, stage unspecified: Secondary | ICD-10-CM | POA: Diagnosis not present

## 2017-06-04 DIAGNOSIS — H30031 Focal chorioretinal inflammation, peripheral, right eye: Secondary | ICD-10-CM | POA: Diagnosis not present

## 2017-06-04 DIAGNOSIS — H3581 Retinal edema: Secondary | ICD-10-CM | POA: Diagnosis not present

## 2017-06-04 DIAGNOSIS — Z79899 Other long term (current) drug therapy: Secondary | ICD-10-CM | POA: Diagnosis not present

## 2017-06-05 ENCOUNTER — Ambulatory Visit: Payer: Medicare Other | Admitting: Family Medicine

## 2017-06-19 ENCOUNTER — Ambulatory Visit (INDEPENDENT_AMBULATORY_CARE_PROVIDER_SITE_OTHER): Payer: Medicare Other | Admitting: Family Medicine

## 2017-06-19 ENCOUNTER — Encounter: Payer: Self-pay | Admitting: Family Medicine

## 2017-06-19 VITALS — BP 119/72 | HR 80 | Wt 146.0 lb

## 2017-06-19 DIAGNOSIS — IMO0002 Reserved for concepts with insufficient information to code with codable children: Secondary | ICD-10-CM

## 2017-06-19 DIAGNOSIS — I10 Essential (primary) hypertension: Secondary | ICD-10-CM | POA: Diagnosis not present

## 2017-06-19 DIAGNOSIS — C44311 Basal cell carcinoma of skin of nose: Secondary | ICD-10-CM | POA: Diagnosis not present

## 2017-06-19 DIAGNOSIS — E1149 Type 2 diabetes mellitus with other diabetic neurological complication: Secondary | ICD-10-CM | POA: Diagnosis not present

## 2017-06-19 DIAGNOSIS — M1711 Unilateral primary osteoarthritis, right knee: Secondary | ICD-10-CM | POA: Insufficient documentation

## 2017-06-19 DIAGNOSIS — E1139 Type 2 diabetes mellitus with other diabetic ophthalmic complication: Secondary | ICD-10-CM | POA: Diagnosis not present

## 2017-06-19 DIAGNOSIS — M1731 Unilateral post-traumatic osteoarthritis, right knee: Secondary | ICD-10-CM | POA: Diagnosis not present

## 2017-06-19 DIAGNOSIS — M25561 Pain in right knee: Secondary | ICD-10-CM | POA: Diagnosis not present

## 2017-06-19 DIAGNOSIS — G8929 Other chronic pain: Secondary | ICD-10-CM | POA: Diagnosis not present

## 2017-06-19 DIAGNOSIS — E1165 Type 2 diabetes mellitus with hyperglycemia: Secondary | ICD-10-CM | POA: Diagnosis not present

## 2017-06-19 MED ORDER — DICLOFENAC SODIUM 1 % TD GEL: 4.0000 g | Freq: Four times a day (QID) | TRANSDERMAL | 11 refills | Status: DC

## 2017-06-19 NOTE — Progress Notes (Signed)
Carmen Cooper is a 77 y.o. female who presents to [facility] today for follow-up basal cell carcinoma nose, discuss right knee pain, and follow-up diabetes and hypertension.  Basal cell carcinoma: And was diagnosed with basal cell carcinoma recently a shave biopsy.  She was referred to Mohs surgeon had surgery weeks ago.  She is feeling great with no issues.  Additionally she notes right knee pain.  This is been a chronic issue ongoing for years. It was previously identified as DJD by previous orthopedic surgeons.  She notes symptoms are moderate and worse when she stands from a seated position.  She notes occasional knee swelling.  She denies locking catching or giving way.  She has tried naproxen or ibuprofen which have not helped much.  She notes that she is not supposed to take these medications with her diabetes.  HTN: Taking medications listed below.  No chest pain palpitations or shortness of breath  DM: Managed by Endocrinology. No ED visit for DKA in >6 weeks. BS typically in 200s but no lows or greater than 300s. She feels pretty well.    Past Medical History:  Diagnosis Date  . Diabetes ([place])   . Glaucoma   . Hypertension   . Thyroid disease    No past surgical history on file. Social History   Tobacco Use  . Smoking status: Current Every Day Smoker  . Smokeless tobacco: Never Used  Substance Use Topics  . Alcohol use: Yes    Comment: 2-3 a day   family history is not on file.  ROS as above:  Medications: Current Outpatient Medications  Medication Sig Dispense Refill  . AMBULATORY NON FORMULARY MEDICATION Freestyle light test strips Test twice a day  Dx type 2 diabetes E11.9 100 each 11  . amLODipine (NORVASC) 10 MG tablet Take 1 tablet (10 mg total) by mouth daily. 90 tablet 1  . aspirin EC 81 MG tablet Take by mouth.    Marland Kitchen atorvastatin  (LIPITOR) 40 MG tablet Take 1 tablet (40 mg total) by mouth daily. Due for lab work 90 tablet 1  . citalopram (CELEXA) 20 MG tablet Take by mouth.    . dicyclomine (BENTYL) 10 MG capsule Take 1 capsule (10 mg total) by mouth 3 (three) times daily as needed for spasms. 180 capsule 1  . Dorzolamide HCl-Timolol Mal PF 22.3-6.8 MG/ML SOLN Apply to eye.    . folic acid (FOLVITE) 1 MG tablet     . FREESTYLE LITE test strip     . HUMIRA PEN 40 MG/0.4ML PNKT     . hypromellose (GENTEAL) 0.3 % GEL ophthalmic ointment Apply to eye.    . insulin lispro (HUMALOG) 100 UNIT/ML injection Medtronic 630G pump.  Basal 12-6a 0.625, 6a-7p 0.725, 7p-12a 0.625.  Preset bolus:  4/5/6.  ISF 50.  Total daily dose:  40 units/day    . levothyroxine (SYNTHROID, LEVOTHROID) 112 MCG tablet TAKE 1 TABLET DAILY 90 tablet 0  . lisinopril-hydrochlorothiazide (ZESTORETIC) 20-12.5 MG tablet Take 1 tablet by mouth daily. 90 tablet 3  . methotrexate (RHEUMATREX) 2.5 MG tablet Take by mouth.    . Multiple Vitamin (MULTI-VITAMINS) TABS Take by mouth.    . multivitamin-iron-minerals-folic acid (CENTRUM) chewable tablet Chew by mouth.    . mupirocin ointment (BACTROBAN) 2 % Apply topically 2 (two) times daily. 30 g 3  . nepafenac (NEVANAC) 0.1 % ophthalmic suspension Place 1 drop into the right eye 3 (  three) times a day.    . Omega-3 1000 MG CAPS Take by mouth.    . prednisoLONE acetate (PRED FORTE) 1 % ophthalmic suspension     . triamcinolone cream (KENALOG) 0.5 % Apply 1 application topically 2 (two) times daily. To affected areas. 30 g 3  . ZIOPTAN 0.0015 % SOLN      No current facility-administered medications for this visit.    Allergies  Allergen Reactions  . Clindamycin/Lincomycin Rash  . Sulfa Antibiotics Rash  . Valacyclovir Hcl Rash    Health Maintenance Health Maintenance  Topic Date Due  . OPHTHALMOLOGY EXAM  09/14/2016  . HEMOGLOBIN A1C  09/03/2017  . INFLUENZA VACCINE  09/13/2017  . FOOT EXAM  03/06/2018    . TETANUS/TDAP  07/25/2025  . DEXA SCAN  Completed  . PNA vac Low Risk Adult  Completed     Exam:  BP 119/72   Pulse 80   Wt 146 lb (66.2 kg)   BMI 24.30 kg/m  Gen: Well NAD HEENT: EOMI,  MMM Lungs: Normal work of breathing. CTABL Heart: RRR no MRG Abd: NABS, Soft. Nondistended, Nontender Exts: Brisk capillary refill, warm and well perfused.  Skin: Well appearing incision scar on nose MSK: Right knee: Mild effusion ROM 0-120 1+ retropatellar crepitations Non-tender. Stable ligamentous exam Strength intact.       Assessment and Plan: 77 y.o. female with  BCC: Doing well s/p MOHS surgery. Recheck prn.   Knee DJD: Mild to moderate. Plan for diclofenac gel and home exercises. Recheck in a few months.   HTN: doing well continue current medications.   DM: Remains brittle. Follow up with Endocrinology.    No orders of the defined types were placed in this encounter.  No orders of the defined types were placed in this encounter.    Discussed warning signs or symptoms. Please see discharge instructions. Patient expresses understanding.

## 2017-06-19 NOTE — Patient Instructions (Addendum)
Thank you for coming in today. Recheck in 3 months.  Return sooner if needed.  I think things are stable.   For knee pain apply the diclofenac gel 4x daily for pain as needed.  Return if not doing well sooner.    Diclofenac sodium topical solution What is this medicine? DICLOFENAC (dye KLOE fen ak) is a non-steroidal anti-inflammatory drug (NSAID). It is used to treat osteoarthritis of the knees. This medicine may be used for other purposes; ask your health care provider or pharmacist if you have questions. COMMON BRAND NAME(S): INFLAMMA-K, PENNSAID, VOPAC MDS What should I tell my health care provider before I take this medicine? They need to know if you have any of these conditions: -asthma -bleeding problems -coronary artery bypass graft (CABG) surgery within the past 2 weeks -heart disease -high blood pressure -if you frequently drink alcohol containing drinks -kidney disease -liver disease -open or infected skin -stomach problems -an unusual or allergic reaction to diclofenac, aspirin, other NSAIDs, other medicines, foods, dyes, or preservatives -pregnant or trying to get pregnant -breast-feeding How should I use this medicine? This medicine is for external use only. Follow the directions on the prescription label. Wash hands before and after use. Apply to the clean, dry skin of the knee. Rub around front, back, and sides of the knee. Do not apply to open wounds, infections, swelling, or areas of exfoliative dermatitis. Allow medicine to dry before using any other lotion or medicine on the same place. Do not get this medicine in your mouth or eyes. If this medicine gets in your eye, rinse out with plenty of cool tap water. Protect treatment area from sunlight and sun lamps. Use your doses at regular intervals. Do not use it more often than directed. A special MedGuide will be given to you by the pharmacist with each prescription and refill. Be sure to read this information  carefully each time. Talk to your pediatrician regarding the use of this medicine in children. Special care may be needed. Overdosage: If you think you have taken too much of this medicine contact a poison control center or emergency room at once. NOTE: This medicine is only for you. Do not share this medicine with others. What if I miss a dose? If you miss a dose, use it as soon as you can. If it is almost time for your next dose, use only that dose. Do not use double or extra doses. What may interact with this medicine? Do not take this medicine with any of the following medications: -cidofovir -ketorolac -methotrexate This medicine may also interact with the following medications: -aspirin -cyclosporine -lithium -medicines for blood pressure -medicines that treat or prevent blood clots like warfarin, enoxaparin, and dalteparin -NSAIDs, medicines for pain and inflammation, like ibuprofen or naproxen -other products used on the skin -steroid medicines like prednisone or cortisone This list may not describe all possible interactions. Give your health care provider a list of all the medicines, herbs, non-prescription drugs, or dietary supplements you use. Also tell them if you smoke, drink alcohol, or use illegal drugs. Some items may interact with your medicine. What should I watch for while using this medicine? Tell your doctor or healthcare professional if your symptoms do not start to get better or if they get worse. This medicine can make you more sensitive to the sun. Keep out of the sun. If you cannot avoid being in the sun, wear protective clothing and use sunscreen. Do not use sun lamps or tanning  beds/booths. Do not take medicines such as ibuprofen and naproxen with this medicine. Side effects such as stomach upset, nausea, or ulcers may be more likely to occur. Many medicines available without a prescription should not be taken with this medicine. This medicine does not prevent  heart attack or stroke. In fact, this medicine may increase the chance of a heart attack or stroke. The chance may increase with longer use of this medicine and in people who have heart disease. If you take aspirin to prevent heart attack or stroke, talk with your doctor or health care professional. This medicine can cause ulcers and bleeding in the stomach and intestines at any time during treatment. Ulcers and bleeding can happen without warning symptoms and can cause death. To reduce your risk, do not smoke cigarettes or drink alcohol while you are taking this medicine. This medicine can cause you to bleed more easily. Try to avoid damage to your teeth and gums when you brush or floss your teeth. What side effects may I notice from receiving this medicine? Side effects that you should report to your doctor or health care professional as soon as possible: -allergic reactions like skin rash, itching or hives, swelling of the face, lips, or tongue -black or bloody stools, blood in the urine or vomit -blurred vision -chest pain -difficulty breathing or wheezing -nausea or vomiting -redness, blistering, peeling or loosening of the skin, including inside the mouth -slurred speech or weakness on one side of the body -trouble passing urine or change in the amount of urine -unexplained weight gain or swelling -unusually weak or tired -yellowing of eyes or skin Side effects that usually do not require medical attention (report to your doctor or health care professional if they continue or are bothersome): -dizziness -dry skin -headache -increased sensitivity to the sun -stomach upset -tingling at the application This list may not describe all possible side effects. Call your doctor for medical advice about side effects. You may report side effects to FDA at 1-800-FDA-1088. Where should I keep my medicine? Keep out of the reach of children. Store at room temperature between 15 and 30 degrees C (59  and 86 degrees F). Keep container tightly closed. Throw away any unused medicine after the expiration date. NOTE: This sheet is a summary. It may not cover all possible information. If you have questions about this medicine, talk to your doctor, pharmacist, or health care provider.  2018 Elsevier/Gold Standard (2015-03-04 09:11:01)

## 2017-07-04 DIAGNOSIS — H4043X3 Glaucoma secondary to eye inflammation, bilateral, severe stage: Secondary | ICD-10-CM | POA: Diagnosis not present

## 2017-07-04 DIAGNOSIS — Z961 Presence of intraocular lens: Secondary | ICD-10-CM | POA: Diagnosis not present

## 2017-07-04 DIAGNOSIS — H209 Unspecified iridocyclitis: Secondary | ICD-10-CM | POA: Diagnosis not present

## 2017-07-11 ENCOUNTER — Encounter: Payer: Self-pay | Admitting: Physician Assistant

## 2017-07-20 DIAGNOSIS — E1049 Type 1 diabetes mellitus with other diabetic neurological complication: Secondary | ICD-10-CM | POA: Diagnosis not present

## 2017-07-20 DIAGNOSIS — E039 Hypothyroidism, unspecified: Secondary | ICD-10-CM | POA: Diagnosis not present

## 2017-07-20 DIAGNOSIS — E1069 Type 1 diabetes mellitus with other specified complication: Secondary | ICD-10-CM | POA: Diagnosis not present

## 2017-07-20 DIAGNOSIS — E1021 Type 1 diabetes mellitus with diabetic nephropathy: Secondary | ICD-10-CM | POA: Diagnosis not present

## 2017-07-20 DIAGNOSIS — E1065 Type 1 diabetes mellitus with hyperglycemia: Secondary | ICD-10-CM | POA: Diagnosis not present

## 2017-07-20 DIAGNOSIS — E785 Hyperlipidemia, unspecified: Secondary | ICD-10-CM | POA: Diagnosis not present

## 2017-07-20 DIAGNOSIS — I10 Essential (primary) hypertension: Secondary | ICD-10-CM | POA: Diagnosis not present

## 2017-07-25 ENCOUNTER — Telehealth: Payer: Self-pay

## 2017-07-25 MED ORDER — LISINOPRIL-HYDROCHLOROTHIAZIDE 20-12.5 MG PO TABS: 1.0000 | ORAL_TABLET | Freq: Every day | ORAL | 3 refills | Status: DC

## 2017-07-25 NOTE — Telephone Encounter (Signed)
Patient called and stated that her pharmacy [facility] no longer carries Lisinopril HTCZ 20/12.5 mg and they are requesting something else sent in for the patient please advise. Kourtland Coopman,CMA

## 2017-07-27 MED ORDER — HYDROCHLOROTHIAZIDE 12.5 MG PO TABS: 12.5000 mg | ORAL_TABLET | Freq: Every day | ORAL | 3 refills | Status: DC

## 2017-07-27 MED ORDER — LISINOPRIL 20 MG PO TABS: 20.0000 mg | ORAL_TABLET | Freq: Every day | ORAL | 3 refills | Status: DC

## 2017-07-27 NOTE — Telephone Encounter (Signed)
Patient has been advised. Rhonda Cunningham,CMA  

## 2017-07-27 NOTE — Telephone Encounter (Signed)
I will prescribe the separate components of this medicine in 2 separate prescriptions.  Will prescribe lisinopril by itself and HCTZ (hydrochlorothiazide) by itself.  New prescription sent to pharmacy.

## 2017-07-30 LAB — TSH: TSH: 2.46 (ref 0.41–5.90)

## 2017-08-08 DIAGNOSIS — H4041X1 Glaucoma secondary to eye inflammation, right eye, mild stage: Secondary | ICD-10-CM | POA: Diagnosis not present

## 2017-08-08 DIAGNOSIS — H30031 Focal chorioretinal inflammation, peripheral, right eye: Secondary | ICD-10-CM | POA: Diagnosis not present

## 2017-08-08 DIAGNOSIS — H209 Unspecified iridocyclitis: Secondary | ICD-10-CM | POA: Diagnosis not present

## 2017-08-08 DIAGNOSIS — H44111 Panuveitis, right eye: Secondary | ICD-10-CM | POA: Diagnosis not present

## 2017-08-08 DIAGNOSIS — Z79899 Other long term (current) drug therapy: Secondary | ICD-10-CM | POA: Diagnosis not present

## 2017-08-08 DIAGNOSIS — H3581 Retinal edema: Secondary | ICD-10-CM | POA: Diagnosis not present

## 2017-08-08 DIAGNOSIS — Z961 Presence of intraocular lens: Secondary | ICD-10-CM | POA: Diagnosis not present

## 2017-08-08 DIAGNOSIS — H401120 Primary open-angle glaucoma, left eye, stage unspecified: Secondary | ICD-10-CM | POA: Diagnosis not present

## 2017-08-24 DIAGNOSIS — R9431 Abnormal electrocardiogram [ECG] [EKG]: Secondary | ICD-10-CM | POA: Diagnosis not present

## 2017-08-24 DIAGNOSIS — Z8673 Personal history of transient ischemic attack (TIA), and cerebral infarction without residual deficits: Secondary | ICD-10-CM | POA: Diagnosis not present

## 2017-08-24 DIAGNOSIS — J9859 Other diseases of mediastinum, not elsewhere classified: Secondary | ICD-10-CM | POA: Diagnosis not present

## 2017-08-24 DIAGNOSIS — Z882 Allergy status to sulfonamides status: Secondary | ICD-10-CM | POA: Diagnosis not present

## 2017-08-24 DIAGNOSIS — Z888 Allergy status to other drugs, medicaments and biological substances status: Secondary | ICD-10-CM | POA: Diagnosis not present

## 2017-08-24 DIAGNOSIS — H209 Unspecified iridocyclitis: Secondary | ICD-10-CM | POA: Diagnosis not present

## 2017-08-24 DIAGNOSIS — E1010 Type 1 diabetes mellitus with ketoacidosis without coma: Secondary | ICD-10-CM | POA: Diagnosis not present

## 2017-08-24 DIAGNOSIS — E871 Hypo-osmolality and hyponatremia: Secondary | ICD-10-CM | POA: Diagnosis not present

## 2017-08-24 DIAGNOSIS — R531 Weakness: Secondary | ICD-10-CM | POA: Diagnosis not present

## 2017-08-24 DIAGNOSIS — H409 Unspecified glaucoma: Secondary | ICD-10-CM | POA: Diagnosis not present

## 2017-08-24 DIAGNOSIS — Z9641 Presence of insulin pump (external) (internal): Secondary | ICD-10-CM | POA: Diagnosis not present

## 2017-08-24 DIAGNOSIS — Z7982 Long term (current) use of aspirin: Secondary | ICD-10-CM | POA: Diagnosis not present

## 2017-08-24 DIAGNOSIS — Z720 Tobacco use: Secondary | ICD-10-CM | POA: Diagnosis not present

## 2017-08-24 DIAGNOSIS — E039 Hypothyroidism, unspecified: Secondary | ICD-10-CM | POA: Diagnosis not present

## 2017-08-24 DIAGNOSIS — Z79899 Other long term (current) drug therapy: Secondary | ICD-10-CM | POA: Diagnosis not present

## 2017-08-24 DIAGNOSIS — I10 Essential (primary) hypertension: Secondary | ICD-10-CM | POA: Diagnosis not present

## 2017-08-24 DIAGNOSIS — Z881 Allergy status to other antibiotic agents status: Secondary | ICD-10-CM | POA: Diagnosis not present

## 2017-08-24 DIAGNOSIS — Z794 Long term (current) use of insulin: Secondary | ICD-10-CM | POA: Diagnosis not present

## 2017-08-24 DIAGNOSIS — E785 Hyperlipidemia, unspecified: Secondary | ICD-10-CM | POA: Diagnosis not present

## 2017-08-24 DIAGNOSIS — F17210 Nicotine dependence, cigarettes, uncomplicated: Secondary | ICD-10-CM | POA: Diagnosis not present

## 2017-08-25 DIAGNOSIS — E871 Hypo-osmolality and hyponatremia: Secondary | ICD-10-CM | POA: Diagnosis not present

## 2017-08-25 DIAGNOSIS — E1010 Type 1 diabetes mellitus with ketoacidosis without coma: Secondary | ICD-10-CM | POA: Diagnosis not present

## 2017-08-25 DIAGNOSIS — E039 Hypothyroidism, unspecified: Secondary | ICD-10-CM | POA: Diagnosis not present

## 2017-08-25 DIAGNOSIS — Z720 Tobacco use: Secondary | ICD-10-CM | POA: Diagnosis not present

## 2017-08-25 LAB — HEMOGLOBIN A1C: HEMOGLOBIN A1C: 8.1 % — AB (ref 4.0–5.6)

## 2017-08-26 DIAGNOSIS — Z720 Tobacco use: Secondary | ICD-10-CM | POA: Diagnosis not present

## 2017-08-26 DIAGNOSIS — E871 Hypo-osmolality and hyponatremia: Secondary | ICD-10-CM | POA: Diagnosis not present

## 2017-08-26 DIAGNOSIS — E039 Hypothyroidism, unspecified: Secondary | ICD-10-CM | POA: Diagnosis not present

## 2017-08-26 DIAGNOSIS — E1010 Type 1 diabetes mellitus with ketoacidosis without coma: Secondary | ICD-10-CM | POA: Diagnosis not present

## 2017-08-28 DIAGNOSIS — E1021 Type 1 diabetes mellitus with diabetic nephropathy: Secondary | ICD-10-CM | POA: Diagnosis not present

## 2017-08-28 DIAGNOSIS — E1065 Type 1 diabetes mellitus with hyperglycemia: Secondary | ICD-10-CM | POA: Diagnosis not present

## 2017-08-28 DIAGNOSIS — E039 Hypothyroidism, unspecified: Secondary | ICD-10-CM | POA: Diagnosis not present

## 2017-08-28 DIAGNOSIS — E1069 Type 1 diabetes mellitus with other specified complication: Secondary | ICD-10-CM | POA: Diagnosis not present

## 2017-08-28 DIAGNOSIS — I10 Essential (primary) hypertension: Secondary | ICD-10-CM | POA: Diagnosis not present

## 2017-08-28 DIAGNOSIS — E1049 Type 1 diabetes mellitus with other diabetic neurological complication: Secondary | ICD-10-CM | POA: Diagnosis not present

## 2017-08-28 DIAGNOSIS — E785 Hyperlipidemia, unspecified: Secondary | ICD-10-CM | POA: Diagnosis not present

## 2017-08-28 MED ORDER — ENOXAPARIN SODIUM 40 MG/0.4ML ~~LOC~~ SOLN
40.00 | SUBCUTANEOUS | Status: DC
Start: 2017-08-26 — End: 2017-08-28

## 2017-08-28 MED ORDER — GENERIC EXTERNAL MEDICATION
Status: DC
Start: ? — End: 2017-08-28

## 2017-08-28 MED ORDER — ASPIRIN EC 81 MG PO TBEC
81.00 | DELAYED_RELEASE_TABLET | ORAL | Status: DC
Start: 2017-08-27 — End: 2017-08-28

## 2017-08-28 MED ORDER — HYDRALAZINE HCL 20 MG/ML IJ SOLN
10.00 | INTRAMUSCULAR | Status: DC
Start: ? — End: 2017-08-28

## 2017-08-28 MED ORDER — FOLIC ACID 1 MG PO TABS
1.00 | ORAL_TABLET | ORAL | Status: DC
Start: 2017-08-27 — End: 2017-08-28

## 2017-08-28 MED ORDER — CYCLOSPORINE MODIFIED (NEORAL) 25 MG PO CAPS
50.00 | ORAL_CAPSULE | ORAL | Status: DC
Start: 2017-08-26 — End: 2017-08-28

## 2017-08-28 MED ORDER — SODIUM CHLORIDE 0.9 % IV SOLN
10.00 | INTRAVENOUS | Status: DC
Start: ? — End: 2017-08-28

## 2017-08-28 MED ORDER — TEMAZEPAM 15 MG PO CAPS
15.00 | ORAL_CAPSULE | ORAL | Status: DC
Start: ? — End: 2017-08-28

## 2017-08-28 MED ORDER — ALUM & MAG HYDROXIDE-SIMETH 200-200-20 MG/5ML PO SUSP
30.00 | ORAL | Status: DC
Start: ? — End: 2017-08-28

## 2017-08-28 MED ORDER — FISH OIL 1000 MG PO CAPS
1.00 | ORAL_CAPSULE | ORAL | Status: DC
Start: 2017-08-27 — End: 2017-08-28

## 2017-08-28 MED ORDER — MAGNESIUM HYDROXIDE 400 MG/5ML PO SUSP
15.00 | ORAL | Status: DC
Start: ? — End: 2017-08-28

## 2017-08-28 MED ORDER — NITROGLYCERIN 0.4 MG SL SUBL
0.40 | SUBLINGUAL_TABLET | SUBLINGUAL | Status: DC
Start: ? — End: 2017-08-28

## 2017-08-28 MED ORDER — THERA PO TABS
1.00 | ORAL_TABLET | ORAL | Status: DC
Start: 2017-08-27 — End: 2017-08-28

## 2017-08-28 MED ORDER — SODIUM CHLORIDE 0.9 % IV SOLN
30.00 | INTRAVENOUS | Status: DC
Start: ? — End: 2017-08-28

## 2017-08-28 MED ORDER — ACETAMINOPHEN 650 MG RE SUPP
650.00 | RECTAL | Status: DC
Start: ? — End: 2017-08-28

## 2017-08-28 MED ORDER — AMLODIPINE BESYLATE 10 MG PO TABS
10.00 | ORAL_TABLET | ORAL | Status: DC
Start: 2017-08-27 — End: 2017-08-28

## 2017-08-28 MED ORDER — LISINOPRIL 20 MG PO TABS
20.00 | ORAL_TABLET | ORAL | Status: DC
Start: 2017-08-27 — End: 2017-08-28

## 2017-08-28 MED ORDER — LEVOTHYROXINE SODIUM 112 MCG PO TABS
112.00 | ORAL_TABLET | ORAL | Status: DC
Start: 2017-08-26 — End: 2017-08-28

## 2017-08-28 MED ORDER — ATORVASTATIN CALCIUM 40 MG PO TABS
40.00 | ORAL_TABLET | ORAL | Status: DC
Start: 2017-08-27 — End: 2017-08-28

## 2017-08-28 MED ORDER — ACETAMINOPHEN 325 MG PO TABS
650.00 | ORAL_TABLET | ORAL | Status: DC
Start: ? — End: 2017-08-28

## 2017-09-04 ENCOUNTER — Encounter: Payer: Self-pay | Admitting: Family Medicine

## 2017-09-26 ENCOUNTER — Other Ambulatory Visit: Payer: Self-pay | Admitting: Family Medicine

## 2017-09-26 DIAGNOSIS — Z1239 Encounter for other screening for malignant neoplasm of breast: Secondary | ICD-10-CM

## 2017-09-27 ENCOUNTER — Ambulatory Visit (INDEPENDENT_AMBULATORY_CARE_PROVIDER_SITE_OTHER): Payer: Medicare Other | Admitting: Family Medicine

## 2017-09-27 ENCOUNTER — Encounter: Payer: Self-pay | Admitting: Family Medicine

## 2017-09-27 VITALS — BP 131/71 | HR 72 | Ht 65.0 in | Wt 140.0 lb

## 2017-09-27 DIAGNOSIS — E1165 Type 2 diabetes mellitus with hyperglycemia: Secondary | ICD-10-CM | POA: Diagnosis not present

## 2017-09-27 DIAGNOSIS — E1139 Type 2 diabetes mellitus with other diabetic ophthalmic complication: Secondary | ICD-10-CM

## 2017-09-27 DIAGNOSIS — Z794 Long term (current) use of insulin: Secondary | ICD-10-CM | POA: Diagnosis not present

## 2017-09-27 DIAGNOSIS — H4041X0 Glaucoma secondary to eye inflammation, right eye, stage unspecified: Secondary | ICD-10-CM | POA: Diagnosis not present

## 2017-09-27 DIAGNOSIS — IMO0002 Reserved for concepts with insufficient information to code with codable children: Secondary | ICD-10-CM

## 2017-09-27 DIAGNOSIS — H209 Unspecified iridocyclitis: Secondary | ICD-10-CM | POA: Diagnosis not present

## 2017-09-27 DIAGNOSIS — Z139 Encounter for screening, unspecified: Secondary | ICD-10-CM

## 2017-09-27 DIAGNOSIS — E031 Congenital hypothyroidism without goiter: Secondary | ICD-10-CM | POA: Diagnosis not present

## 2017-09-27 DIAGNOSIS — E1149 Type 2 diabetes mellitus with other diabetic neurological complication: Secondary | ICD-10-CM

## 2017-09-27 DIAGNOSIS — I10 Essential (primary) hypertension: Secondary | ICD-10-CM

## 2017-09-27 DIAGNOSIS — E113599 Type 2 diabetes mellitus with proliferative diabetic retinopathy without macular edema, unspecified eye: Secondary | ICD-10-CM | POA: Diagnosis not present

## 2017-09-27 DIAGNOSIS — Z23 Encounter for immunization: Secondary | ICD-10-CM | POA: Diagnosis not present

## 2017-09-27 MED ORDER — ATORVASTATIN CALCIUM 40 MG PO TABS: 40.0000 mg | ORAL_TABLET | Freq: Every day | ORAL | 3 refills | Status: DC

## 2017-09-27 MED ORDER — HYDROCHLOROTHIAZIDE 12.5 MG PO TABS: 12.5000 mg | ORAL_TABLET | Freq: Every day | ORAL | 3 refills | Status: DC

## 2017-09-27 MED ORDER — LEVOTHYROXINE SODIUM 112 MCG PO TABS: 112.0000 ug | ORAL_TABLET | Freq: Every day | ORAL | 3 refills | Status: DC

## 2017-09-27 MED ORDER — LISINOPRIL 20 MG PO TABS: 20.0000 mg | ORAL_TABLET | Freq: Every day | ORAL | 3 refills | Status: DC

## 2017-09-27 MED ORDER — AMLODIPINE BESYLATE 10 MG PO TABS: 10.0000 mg | ORAL_TABLET | Freq: Every day | ORAL | 3 refills | Status: DC

## 2017-09-27 NOTE — Progress Notes (Signed)
Carmen Cooper is a 77 y.o. female who presents to [facility] [place] today for follow-up hypertension, diabetes, hypothyroid.   Carmen Cooper is difficult to control diabetes managed via endocrinology.  She takes insulin pump and notes that most of the time her blood sugars have been better controlled.  Her A1c was 8.1 last month.  She is been hospitalized over the last month with an episode of hyperglycemia.  She has had one episode of low blood sugars into the 40s but felt it and recognize her low blood sugar episode.  Additionally she has hypertension which is managed with medications listed below.  No chest pains palpitations or shortness of breath.  Additionally patient has hypothyroidism managed with levothyroxine.  Her TSH was 0.48 last month as well.  I have been prescribing this medication historically.   ROS as above:  Exam:  BP 131/71   Pulse 72   Ht 5\' 5"  (1.651 m)   Wt 140 lb (63.5 kg)   BMI 23.30 kg/m  Gen: Well NAD HEENT: EOMI,  MMM Lungs: Normal work of breathing. CTABL Heart: RRR no MRG Abd: NABS, Soft. Nondistended, Nontender Exts: Brisk capillary refill, warm and well perfused.    Lab and Radiology Results Reviewed   TSH 0.49 08/25/17  Lipids: 03/27/17 TC: 96 TG 57 LDL 28 VLDL 11 HDL 57  Care Everywhere Result Report Hemoglobin A1cResulted: 08/25/2017 3:33 AM [facility] Component Name Value Ref Range  Hemoglobin A1c 8.1 (H) 4.8 - 5.6 %  Specimen Collected on  Blood 08/25/2017 2:59 AM       Care Everywhere Result Report Basic metabolic panelResulted: 3/38/2505 6:04 AM [facility] Component Name Value Ref Range  Na 142 136 - 146 mmol/L  Potassium 3.9 3.7 - 5.4 mmol/L  Cl 105 97 - 108 mmol/L  CO2 24 20 - 32 mmol/L  Glucose 244 (H) 65 - 99 mg/dL  BUN 17 8 - 27 mg/dL  Creatinine 0.83 0.57 - 1 mg/dL  Ca 8.8 8.6 - 10.2 mg/dL  BUN/CREAT  RATIO 20.5 11 - 26   GFR AFRICAN AMERICAN 79  Comment:  African-American:  Normal GFR (glomerular filtration rate) > 60 mL/min/1.73 meters squared. < 60 may include impaired kidney function based on creatinine, age, legal sex, and race normalized to accepted average body surface area mL/min/1.18m2  GFR Non African American 68  Comment:  Non African American:  Normal GFR (glomerular filtration rate) > 60 mL/min/1.73 meters squared. < 60 may include impaired kidney function based on creatinine, age, legal sex, and race normalized to accepted average body surface area. mL/min/1.2m2  AGAP 13 7 - 16 mmol/L  Specimen Collected on  Blood 08/26/2017 5:31 AM      Assessment and Plan: 77 y.o. female with  Diabetes remains very difficult to control.  Patient is receiving excellent management with endocrinology.  Continue current regimen and careful monitoring for hypoglycemic episodes.  Avoid steroids and recheck in 3 months.  Hypertension: Blood pressure reasonably controlled today.  Creatinine and electrolytes recently checked and are within normal limits.  Continue current regimen.  Hypothyroidism: I have been prescribing this medication and her TSH was recently checked and in the normal range.  Plan to continue current regimen of medication refilled.  If taken over by endocrinology that is okay as well.  Influenza vaccine given today prior to discharge.   Orders Placed This Encounter  Procedures  . Flu Vaccine QUAD 36+ mos IM  [facility]   Meds ordered this encounter  Medications  . amLODipine (NORVASC) 10 MG tablet    Sig: Take 1 tablet (10 mg total) by mouth daily.    Dispense:  90 tablet    Refill:  3  . hydrochlorothiazide (HYDRODIURIL) 12.5 MG tablet    Sig: Take 1 tablet (12.5 mg total) by mouth daily.    Dispense:  90 tablet    Refill:  3  . atorvastatin (LIPITOR) 40 MG tablet    Sig: Take 1 tablet (40 mg total) by mouth daily. Due for lab work    Dispense:  90 tablet      Refill:  3  . levothyroxine (SYNTHROID, LEVOTHROID) 112 MCG tablet    Sig: Take 1 tablet (112 mcg total) by mouth daily.    Dispense:  90 tablet    Refill:  3  . lisinopril (PRINIVIL,ZESTRIL) 20 MG tablet    Sig: Take 1 tablet (20 mg total) by mouth daily.    Dispense:  90 tablet    Refill:  3     Historical information moved to improve visibility of documentation.  Past Medical History:  Diagnosis Date  . BCC (basal cell carcinoma of skin)   . Diabetes ([place])   . Glaucoma   . History of TIA (transient ischemic attack) 08/11/2013   12/2012 - [name]   . Hypertension   . Hypothyroidism 08/11/2013  . Microscopic colitis 08/21/2013   2008 [facility] [name].  Normal colonoscopy 2009 repeat as routine in 2019   . Thyroid disease   . 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, [facility]    Past Surgical History:  Procedure Laterality Date  . MOHS SURGERY  2019   Nose bcc    Social History   Tobacco Use  . Smoking status: Current Every Day Smoker  . Smokeless tobacco: Never Used  Substance Use Topics  . Alcohol use: Yes    Comment: 2-3 a day   family history is not on file.  Medications: Current Outpatient Medications  Medication Sig Dispense Refill  . AMBULATORY NON FORMULARY MEDICATION Freestyle light test strips Test twice a day  Dx type 2 diabetes E11.9 100 each 11  . amLODipine (NORVASC) 10 MG tablet Take 1 tablet (10 mg total) by mouth daily. 90 tablet 3  . aspirin EC 81 MG tablet Take by mouth.    Marland Kitchen atorvastatin (LIPITOR) 40 MG tablet Take 1 tablet (40 mg total) by mouth daily. Due for lab work 90 tablet 3  . diclofenac sodium (VOLTAREN) 1 % GEL Apply 4 g topically 4 (four) times daily. To affected joint. 500 g 11  . Dorzolamide HCl-Timolol Mal PF 22.3-6.8 MG/ML SOLN Apply to eye.    . folic acid (FOLVITE) 1 MG tablet Take by mouth.    Marland Kitchen FREESTYLE LITE test strip     . HUMIRA PEN 40 MG/0.4ML PNKT     . hydrochlorothiazide (HYDRODIURIL)  12.5 MG tablet Take 1 tablet (12.5 mg total) by mouth daily. 90 tablet 3  . insulin lispro (HUMALOG) 100 UNIT/ML injection Medtronic 630G pump.  Basal 12-6a 0.625, 6a-7p 0.725, 7p-12a 0.625.  Preset bolus:  4/5/6.  ISF 50.  Total daily dose:  40 units/day    . levothyroxine (SYNTHROID, LEVOTHROID) 112 MCG tablet Take 1 tablet (112 mcg total) by mouth daily. 90 tablet 3  . lisinopril (PRINIVIL,ZESTRIL) 20 MG tablet Take 1 tablet (20 mg total) by mouth daily. 90 tablet 3  .  methotrexate (RHEUMATREX) 2.5 MG tablet Take by mouth.    . Multiple Vitamin (MULTI-VITAMINS) TABS Take by mouth.    . multivitamin-iron-minerals-folic acid (CENTRUM) chewable tablet Chew by mouth.    . nepafenac (NEVANAC) 0.1 % ophthalmic suspension Place 1 drop into the right eye 3 (three) times a day.    . Omega-3 1000 MG CAPS Take by mouth.    . triamcinolone cream (KENALOG) 0.5 % Apply 1 application topically 2 (two) times daily. To affected areas. 30 g 3  . ZIOPTAN 0.0015 % SOLN      No current facility-administered medications for this visit.    Allergies  Allergen Reactions  . Clindamycin/Lincomycin Rash  . Sulfa Antibiotics Rash  . Valacyclovir Hcl Rash     Discussed warning signs or symptoms. Please see discharge instructions. Patient expresses understanding.

## 2017-09-27 NOTE — Patient Instructions (Addendum)
Thank you for coming in today. Recheck with me in 3 months.  Return sooner if needed.   I will discuss with your gastroenterologist about the need for colonscopy. I am not 100% sure that this is a great idea.

## 2017-09-28 ENCOUNTER — Ambulatory Visit: Payer: Medicare Other

## 2017-10-02 DIAGNOSIS — R42 Dizziness and giddiness: Secondary | ICD-10-CM | POA: Diagnosis not present

## 2017-10-02 DIAGNOSIS — R29810 Facial weakness: Secondary | ICD-10-CM | POA: Diagnosis not present

## 2017-10-02 DIAGNOSIS — E039 Hypothyroidism, unspecified: Secondary | ICD-10-CM | POA: Diagnosis not present

## 2017-10-02 DIAGNOSIS — R001 Bradycardia, unspecified: Secondary | ICD-10-CM | POA: Diagnosis not present

## 2017-10-02 DIAGNOSIS — H02401 Unspecified ptosis of right eyelid: Secondary | ICD-10-CM | POA: Diagnosis not present

## 2017-10-02 DIAGNOSIS — R9082 White matter disease, unspecified: Secondary | ICD-10-CM | POA: Diagnosis not present

## 2017-10-02 DIAGNOSIS — Z720 Tobacco use: Secondary | ICD-10-CM | POA: Diagnosis not present

## 2017-10-02 DIAGNOSIS — I639 Cerebral infarction, unspecified: Secondary | ICD-10-CM | POA: Diagnosis not present

## 2017-10-02 DIAGNOSIS — E1010 Type 1 diabetes mellitus with ketoacidosis without coma: Secondary | ICD-10-CM | POA: Diagnosis not present

## 2017-10-02 DIAGNOSIS — G319 Degenerative disease of nervous system, unspecified: Secondary | ICD-10-CM | POA: Diagnosis not present

## 2017-10-02 DIAGNOSIS — G9389 Other specified disorders of brain: Secondary | ICD-10-CM | POA: Diagnosis not present

## 2017-10-02 LAB — BASIC METABOLIC PANEL
BUN: 21 (ref 4–21)
Creatinine: 0.8 (ref 0.5–1.1)
Glucose: 77
Potassium: 4.1 (ref 3.4–5.3)
Sodium: 142 (ref 137–147)

## 2017-10-03 ENCOUNTER — Ambulatory Visit: Payer: Medicare Other

## 2017-10-03 DIAGNOSIS — Z720 Tobacco use: Secondary | ICD-10-CM | POA: Diagnosis not present

## 2017-10-03 DIAGNOSIS — E119 Type 2 diabetes mellitus without complications: Secondary | ICD-10-CM | POA: Diagnosis not present

## 2017-10-03 DIAGNOSIS — R42 Dizziness and giddiness: Secondary | ICD-10-CM | POA: Diagnosis not present

## 2017-10-03 DIAGNOSIS — I10 Essential (primary) hypertension: Secondary | ICD-10-CM | POA: Diagnosis not present

## 2017-10-03 DIAGNOSIS — Z794 Long term (current) use of insulin: Secondary | ICD-10-CM | POA: Diagnosis not present

## 2017-10-05 MED ORDER — LEVOTHYROXINE SODIUM 112 MCG PO TABS
112.00 | ORAL_TABLET | ORAL | Status: DC
Start: 2017-10-04 — End: 2017-10-05

## 2017-10-05 MED ORDER — CYCLOSPORINE MODIFIED (NEORAL) 25 MG PO CAPS
50.00 | ORAL_CAPSULE | ORAL | Status: DC
Start: 2017-10-03 — End: 2017-10-05

## 2017-10-05 MED ORDER — CLONIDINE HCL 0.1 MG PO TABS
0.10 | ORAL_TABLET | ORAL | Status: DC
Start: ? — End: 2017-10-05

## 2017-10-05 MED ORDER — DORZOLAMIDE HCL-TIMOLOL MAL 22.3-6.8 MG/ML OP SOLN
1.00 | OPHTHALMIC | Status: DC
Start: 2017-10-03 — End: 2017-10-05

## 2017-10-05 MED ORDER — DICLOFENAC SODIUM 1 % TD GEL
4.00 | TRANSDERMAL | Status: DC
Start: ? — End: 2017-10-05

## 2017-10-05 MED ORDER — ATORVASTATIN CALCIUM 40 MG PO TABS
40.00 | ORAL_TABLET | ORAL | Status: DC
Start: 2017-10-03 — End: 2017-10-05

## 2017-10-05 MED ORDER — LATANOPROST 0.005 % OP SOLN
1.00 | OPHTHALMIC | Status: DC
Start: 2017-10-03 — End: 2017-10-05

## 2017-10-05 MED ORDER — FISH OIL 1000 MG PO CAPS
1.00 | ORAL_CAPSULE | ORAL | Status: DC
Start: 2017-10-04 — End: 2017-10-05

## 2017-10-05 MED ORDER — SODIUM CHLORIDE 0.9 % IV SOLN
10.00 | INTRAVENOUS | Status: DC
Start: ? — End: 2017-10-05

## 2017-10-05 MED ORDER — THERA PO TABS
1.00 | ORAL_TABLET | ORAL | Status: DC
Start: 2017-10-04 — End: 2017-10-05

## 2017-10-05 MED ORDER — NITROGLYCERIN 0.4 MG SL SUBL
.40 | SUBLINGUAL_TABLET | SUBLINGUAL | Status: DC
Start: ? — End: 2017-10-05

## 2017-10-05 MED ORDER — GENERIC EXTERNAL MEDICATION
10.00 | Status: DC
Start: ? — End: 2017-10-05

## 2017-10-05 MED ORDER — KETOROLAC TROMETHAMINE 0.5 % OP SOLN
1.00 | OPHTHALMIC | Status: DC
Start: 2017-10-03 — End: 2017-10-05

## 2017-10-05 MED ORDER — LORAZEPAM 2 MG/ML IJ SOLN
2.00 | INTRAMUSCULAR | Status: DC
Start: ? — End: 2017-10-05

## 2017-10-05 MED ORDER — AMLODIPINE BESYLATE 10 MG PO TABS
10.00 | ORAL_TABLET | ORAL | Status: DC
Start: 2017-10-04 — End: 2017-10-05

## 2017-10-05 MED ORDER — ASPIRIN EC 81 MG PO TBEC
81.00 | DELAYED_RELEASE_TABLET | ORAL | Status: DC
Start: 2017-10-04 — End: 2017-10-05

## 2017-10-05 MED ORDER — THIAMINE HCL 100 MG PO TABS
100.00 | ORAL_TABLET | ORAL | Status: DC
Start: 2017-10-04 — End: 2017-10-05

## 2017-10-05 MED ORDER — LISINOPRIL 20 MG PO TABS
20.00 | ORAL_TABLET | ORAL | Status: DC
Start: 2017-10-04 — End: 2017-10-05

## 2017-10-05 MED ORDER — GENERIC EXTERNAL MEDICATION
Status: DC
Start: ? — End: 2017-10-05

## 2017-10-05 MED ORDER — ENOXAPARIN SODIUM 40 MG/0.4ML ~~LOC~~ SOLN
40.00 | SUBCUTANEOUS | Status: DC
Start: 2017-10-04 — End: 2017-10-05

## 2017-10-05 MED ORDER — HYDROCHLOROTHIAZIDE 12.5 MG PO TABS
12.50 | ORAL_TABLET | ORAL | Status: DC
Start: 2017-10-04 — End: 2017-10-05

## 2017-10-05 MED ORDER — SODIUM CHLORIDE 0.9 % IV SOLN
20.00 | INTRAVENOUS | Status: DC
Start: ? — End: 2017-10-05

## 2017-10-05 MED ORDER — ALUM & MAG HYDROXIDE-SIMETH 200-200-20 MG/5ML PO SUSP
30.00 | ORAL | Status: DC
Start: ? — End: 2017-10-05

## 2017-10-05 MED ORDER — TRIAMCINOLONE ACETONIDE 0.5 % EX CREA
TOPICAL_CREAM | CUTANEOUS | Status: DC
Start: ? — End: 2017-10-05

## 2017-10-05 MED ORDER — MECLIZINE HCL 25 MG PO TABS
25.00 | ORAL_TABLET | ORAL | Status: DC
Start: ? — End: 2017-10-05

## 2017-10-05 MED ORDER — FOLIC ACID 1 MG PO TABS
1.00 | ORAL_TABLET | ORAL | Status: DC
Start: 2017-10-04 — End: 2017-10-05

## 2017-10-05 MED ORDER — METHOTREXATE 2.5 MG PO TABS
10.00 | ORAL_TABLET | ORAL | Status: DC
Start: ? — End: 2017-10-05

## 2017-10-18 DIAGNOSIS — H4041X1 Glaucoma secondary to eye inflammation, right eye, mild stage: Secondary | ICD-10-CM | POA: Diagnosis not present

## 2017-10-18 DIAGNOSIS — H209 Unspecified iridocyclitis: Secondary | ICD-10-CM | POA: Diagnosis not present

## 2017-10-18 DIAGNOSIS — Z79899 Other long term (current) drug therapy: Secondary | ICD-10-CM | POA: Diagnosis not present

## 2017-10-18 DIAGNOSIS — H44111 Panuveitis, right eye: Secondary | ICD-10-CM | POA: Diagnosis not present

## 2017-10-18 DIAGNOSIS — H30031 Focal chorioretinal inflammation, peripheral, right eye: Secondary | ICD-10-CM | POA: Diagnosis not present

## 2017-10-18 DIAGNOSIS — Z961 Presence of intraocular lens: Secondary | ICD-10-CM | POA: Diagnosis not present

## 2017-10-18 DIAGNOSIS — H401120 Primary open-angle glaucoma, left eye, stage unspecified: Secondary | ICD-10-CM | POA: Diagnosis not present

## 2017-10-18 DIAGNOSIS — H3581 Retinal edema: Secondary | ICD-10-CM | POA: Diagnosis not present

## 2017-10-19 ENCOUNTER — Encounter: Payer: Self-pay | Admitting: Family Medicine

## 2017-10-19 DIAGNOSIS — E1065 Type 1 diabetes mellitus with hyperglycemia: Secondary | ICD-10-CM | POA: Diagnosis not present

## 2017-10-19 DIAGNOSIS — E039 Hypothyroidism, unspecified: Secondary | ICD-10-CM | POA: Diagnosis not present

## 2017-10-19 DIAGNOSIS — I10 Essential (primary) hypertension: Secondary | ICD-10-CM | POA: Diagnosis not present

## 2017-10-19 DIAGNOSIS — E1021 Type 1 diabetes mellitus with diabetic nephropathy: Secondary | ICD-10-CM | POA: Diagnosis not present

## 2017-11-07 DIAGNOSIS — Z961 Presence of intraocular lens: Secondary | ICD-10-CM | POA: Diagnosis not present

## 2017-11-07 DIAGNOSIS — H209 Unspecified iridocyclitis: Secondary | ICD-10-CM | POA: Diagnosis not present

## 2017-11-07 DIAGNOSIS — H4043X3 Glaucoma secondary to eye inflammation, bilateral, severe stage: Secondary | ICD-10-CM | POA: Diagnosis not present

## 2017-11-07 DIAGNOSIS — H04123 Dry eye syndrome of bilateral lacrimal glands: Secondary | ICD-10-CM | POA: Diagnosis not present

## 2017-11-07 LAB — HM DIABETES EYE EXAM

## 2017-11-23 ENCOUNTER — Encounter: Payer: Self-pay | Admitting: Family Medicine

## 2017-12-25 DIAGNOSIS — H16223 Keratoconjunctivitis sicca, not specified as Sjogren's, bilateral: Secondary | ICD-10-CM | POA: Diagnosis not present

## 2017-12-25 DIAGNOSIS — H539 Unspecified visual disturbance: Secondary | ICD-10-CM | POA: Diagnosis not present

## 2017-12-25 DIAGNOSIS — H04123 Dry eye syndrome of bilateral lacrimal glands: Secondary | ICD-10-CM | POA: Diagnosis not present

## 2017-12-25 DIAGNOSIS — H16143 Punctate keratitis, bilateral: Secondary | ICD-10-CM | POA: Diagnosis not present

## 2017-12-25 DIAGNOSIS — H0288A Meibomian gland dysfunction right eye, upper and lower eyelids: Secondary | ICD-10-CM | POA: Diagnosis not present

## 2017-12-25 DIAGNOSIS — H0288B Meibomian gland dysfunction left eye, upper and lower eyelids: Secondary | ICD-10-CM | POA: Diagnosis not present

## 2017-12-28 ENCOUNTER — Encounter: Payer: Self-pay | Admitting: Family Medicine

## 2017-12-28 ENCOUNTER — Ambulatory Visit (INDEPENDENT_AMBULATORY_CARE_PROVIDER_SITE_OTHER): Payer: Medicare Other | Admitting: Family Medicine

## 2017-12-28 ENCOUNTER — Ambulatory Visit (INDEPENDENT_AMBULATORY_CARE_PROVIDER_SITE_OTHER): Payer: Medicare Other

## 2017-12-28 VITALS — BP 118/41 | HR 80 | Ht 65.0 in | Wt 140.0 lb

## 2017-12-28 DIAGNOSIS — IMO0002 Reserved for concepts with insufficient information to code with codable children: Secondary | ICD-10-CM

## 2017-12-28 DIAGNOSIS — E1165 Type 2 diabetes mellitus with hyperglycemia: Secondary | ICD-10-CM

## 2017-12-28 DIAGNOSIS — M25561 Pain in right knee: Secondary | ICD-10-CM

## 2017-12-28 DIAGNOSIS — I10 Essential (primary) hypertension: Secondary | ICD-10-CM

## 2017-12-28 DIAGNOSIS — Z794 Long term (current) use of insulin: Secondary | ICD-10-CM | POA: Diagnosis not present

## 2017-12-28 DIAGNOSIS — E113599 Type 2 diabetes mellitus with proliferative diabetic retinopathy without macular edema, unspecified eye: Secondary | ICD-10-CM

## 2017-12-28 DIAGNOSIS — M1711 Unilateral primary osteoarthritis, right knee: Secondary | ICD-10-CM | POA: Diagnosis not present

## 2017-12-28 LAB — POCT GLYCOSYLATED HEMOGLOBIN (HGB A1C): HEMOGLOBIN A1C: 8.6 % — AB (ref 4.0–5.6)

## 2017-12-28 NOTE — Patient Instructions (Signed)
Thank you for coming in today. Continue diclofenac gel .  Get xray Add straight leg raises 30 reps 2-3x daily.  Consider Orthovisc gel injections.   Use compression for varicose veins.   Recheck in 3 months.

## 2017-12-28 NOTE — Progress Notes (Signed)
Carmen Cooper is a 77 y.o. female who presents to [facility]: [place] today for follow up HTN, DM, and discuss right knee pain.  She feels well overall.   DM: Doing well recently. She currently has an insulin pump and a continuous glucometer. She has not had a severe hyper or hypoglycemic event requiring hospitalization since August. She feel pretty well overall.   HTN: Carmen Cooper takes the medicine listed below regularly.  She denies any issues with them.  No low blood pressure episodes no lightheadedness or dizziness.  Knee pain: And notes pain in her right knee and going out for a few months.  She notes that occasionally her knee will give way.  She denies any falls or injuries.     ROS as above:  Exam:  BP (!) 118/41   Pulse 80   Ht 5\' 5"  (1.651 m)   Wt 140 lb (63.5 kg)   BMI 23.30 kg/m  Wt Readings from Last 5 Encounters:  12/28/17 140 lb (63.5 kg)  09/27/17 140 lb (63.5 kg)  06/19/17 146 lb (66.2 kg)  05/04/17 147 lb (66.7 kg)  03/06/17 152 lb (68.9 kg)    Gen: Well NAD HEENT: EOMI,  MMM Lungs: Normal work of breathing. CTABL Heart: RRR no MRG Abd: NABS, Soft. Nondistended, Nontender Exts: Brisk capillary refill, warm and well perfused.  Right knee: Contusion or deformities. Normal range of motion. Stable ligamentous exam.  Lab and Radiology Results Results for orders placed or performed in visit on 12/28/17 (from the past 72 hour(s))  POCT HgB A1C     Status: Abnormal   Collection Time: 12/28/17  9:19 AM  Result Value Ref Range   Hemoglobin A1C 8.6 (A) 4.0 - 5.6 %   HbA1c POC (<> result, manual entry)     HbA1c, POC (prediabetic range)     HbA1c, POC (controlled diabetic range)     Dg Knee 1-2 Views Left  Result Date: 12/28/2017 CLINICAL DATA:  Chronic right knee pain. EXAM: LEFT KNEE - 1-2 VIEW COMPARISON:  None. FINDINGS: No evidence of fracture,  dislocation, or joint effusion. No evidence of arthropathy or other focal bone abnormality. Soft tissues are unremarkable. IMPRESSION: Negative. Electronically Signed   By: Fidela Salisbury M.D.   On: 12/28/2017 12:53   Dg Knee Complete 4 Views Right  Result Date: 12/28/2017 CLINICAL DATA:  Chronic right knee pain for several years. EXAM: RIGHT KNEE - COMPLETE 4+ VIEW COMPARISON:  None. FINDINGS: No evidence of fracture, dislocation, or joint effusion. Moderate osteoarthritic changes of the lateral compartment of the right knee, with joint space narrowing, mild subchondral sclerosis, subchondral cysts and small osteophyte formation. Osteophytes of the tibial spines also seen. Minimal osteoarthritic changes of the medial and patellofemoral compartments. Soft tissues are unremarkable. IMPRESSION: Moderate osteoarthritic changes of the lateral compartment of the right knee. Minimal osteoarthritic changes of the medial and patellofemoral compartments. Electronically Signed   By: Fidela Salisbury M.D.   On: 12/28/2017 12:53    I personally (independently) visualized and performed the interpretation of the images attached in this note.   Assessment and Plan: 77 y.o. female with   Diabetes: Managed with endocrinology.  Reasonably well controlled.  Continue current regimen follow-up with endocrinology as scheduled next month.  Hypertension: Also reasonably controlled.  Continue current regimen.  Knee pain: Due to DJD.  Discussed options.  Use diclofenac gel.  Will avoid steroid knee injection as this will cause  hyperglycemia.  Consider hyaluronic acid injection if needed.   Orders Placed This Encounter  Procedures  . DG Knee Complete 4 Views Right    Please include patellar sunrise, lateral, and weightbearing bilateral AP and bilateral rosenberg views    Standing Status:   Future    Number of Occurrences:   1    Standing Expiration Date:   02/27/2019    Order Specific Question:   Reason for  exam:    Answer:   Please include patellar sunrise, lateral, and weightbearing bilateral AP and bilateral rosenberg views    Comments:   Please include patellar sunrise, lateral, and weightbearing bilateral AP and bilateral rosenberg views    Order Specific Question:   Preferred imaging location?    Answer:   [facility]  . DG Knee 1-2 Views Left    Standing Status:   Future    Number of Occurrences:   1    Standing Expiration Date:   02/28/2019    Order Specific Question:   Reason for Exam (SYMPTOM  OR DIAGNOSIS REQUIRED)    Answer:   For use with right knee x-ray, bilateral AP and Rosenberg standing.    Order Specific Question:   Preferred imaging location?    Answer:   [facility]  . Hemoglobin A1c    This external order was created through the Results Console.  Marland Kitchen POCT HgB A1C   No orders of the defined types were placed in this encounter.    Historical information moved to improve visibility of documentation.  Past Medical History:  Diagnosis Date  . BCC (basal cell carcinoma of skin)   . Diabetes ([place])   . Glaucoma   . History of TIA (transient ischemic attack) 08/11/2013   12/2012 - [name]   . Hypertension   . Hypothyroidism 08/11/2013  . Microscopic colitis 08/21/2013   2008 [facility] [name].  Normal colonoscopy 2009 repeat as routine in 2019   . Thyroid disease   . 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, [facility]    Past Surgical History:  Procedure Laterality Date  . MOHS SURGERY  2019   Nose bcc    Social History   Tobacco Use  . Smoking status: Current Every Day Smoker  . Smokeless tobacco: Never Used  Substance Use Topics  . Alcohol use: Yes    Comment: 2-3 a day   family history is not on file.  Medications: Current Outpatient Medications  Medication Sig Dispense Refill  . AMBULATORY NON FORMULARY MEDICATION Freestyle light test strips Test twice a day  Dx type 2 diabetes E11.9 100 each 11  .  amLODipine (NORVASC) 10 MG tablet Take 1 tablet (10 mg total) by mouth daily. 90 tablet 3  . aspirin EC 81 MG tablet Take by mouth.    Marland Kitchen atorvastatin (LIPITOR) 40 MG tablet Take 1 tablet (40 mg total) by mouth daily. Due for lab work 90 tablet 3  . diclofenac sodium (VOLTAREN) 1 % GEL Apply 4 g topically 4 (four) times daily. To affected joint. 500 g 11  . Dorzolamide HCl-Timolol Mal PF 22.3-6.8 MG/ML SOLN Apply to eye.    . folic acid (FOLVITE) 1 MG tablet Take by mouth.    Marland Kitchen FREESTYLE LITE test strip     . HUMIRA PEN 40 MG/0.4ML PNKT     . hydrochlorothiazide (HYDRODIURIL) 12.5 MG tablet Take 1 tablet (12.5 mg total) by mouth daily. 90 tablet 3  . insulin lispro (  HUMALOG) 100 UNIT/ML injection Medtronic 630G pump.  Basal 12-6a 0.625, 6a-7p 0.725, 7p-12a 0.625.  Preset bolus:  4/5/6.  ISF 50.  Total daily dose:  40 units/day    . levothyroxine (SYNTHROID, LEVOTHROID) 112 MCG tablet Take 1 tablet (112 mcg total) by mouth daily. 90 tablet 3  . lisinopril (PRINIVIL,ZESTRIL) 20 MG tablet Take 1 tablet (20 mg total) by mouth daily. 90 tablet 3  . methotrexate (RHEUMATREX) 2.5 MG tablet Take by mouth.    . Multiple Vitamin (MULTI-VITAMINS) TABS Take by mouth.    . multivitamin-iron-minerals-folic acid (CENTRUM) chewable tablet Chew by mouth.    . nepafenac (NEVANAC) 0.1 % ophthalmic suspension Place 1 drop into the right eye 3 (three) times a day.    . Omega-3 1000 MG CAPS Take by mouth.    . triamcinolone cream (KENALOG) 0.5 % Apply 1 application topically 2 (two) times daily. To affected areas. 30 g 3  . ZIOPTAN 0.0015 % SOLN      No current facility-administered medications for this visit.    Allergies  Allergen Reactions  . Clindamycin/Lincomycin Rash  . Sulfa Antibiotics Rash  . Valacyclovir Hcl Rash     Discussed warning signs or symptoms. Please see discharge instructions. Patient expresses understanding.

## 2018-01-03 DIAGNOSIS — H44111 Panuveitis, right eye: Secondary | ICD-10-CM | POA: Diagnosis not present

## 2018-01-03 DIAGNOSIS — Z79899 Other long term (current) drug therapy: Secondary | ICD-10-CM | POA: Diagnosis not present

## 2018-01-03 DIAGNOSIS — Z961 Presence of intraocular lens: Secondary | ICD-10-CM | POA: Diagnosis not present

## 2018-01-03 DIAGNOSIS — H401120 Primary open-angle glaucoma, left eye, stage unspecified: Secondary | ICD-10-CM | POA: Diagnosis not present

## 2018-01-03 DIAGNOSIS — H209 Unspecified iridocyclitis: Secondary | ICD-10-CM | POA: Diagnosis not present

## 2018-01-03 DIAGNOSIS — H30031 Focal chorioretinal inflammation, peripheral, right eye: Secondary | ICD-10-CM | POA: Diagnosis not present

## 2018-01-03 DIAGNOSIS — H4041X1 Glaucoma secondary to eye inflammation, right eye, mild stage: Secondary | ICD-10-CM | POA: Diagnosis not present

## 2018-01-03 DIAGNOSIS — H4089 Other specified glaucoma: Secondary | ICD-10-CM | POA: Diagnosis not present

## 2018-01-03 DIAGNOSIS — H3581 Retinal edema: Secondary | ICD-10-CM | POA: Diagnosis not present

## 2018-02-11 NOTE — Progress Notes (Signed)
Subjective:   Carmen Cooper is a 77 y.o. female who presents for Medicare Annual (Subsequent) preventive examination.  Review of Systems:  No ROS.  Medicare Wellness Visit. Additional risk factors are reflected in the social history.  Cardiac Risk Factors include: diabetes mellitus;advanced age (>94men, >77 women);hypertension;smoking/ tobacco exposure;sedentary lifestyle Sleep patterns: Getting 6 hours of sleep a night. Wakes up 5-6 times a night to go to the bathroom. Wakes up feeling rested.   Home Safety/Smoke Alarms: Feels safe in home. Smoke alarms in place.  Living environment; Lives with husband in a 2 story home , handrails in place on the steps. Shower is a walk in shower no grab bars in place. Seat Belt Safety/Bike Helmet: Wears seat belt.   Female:   Pap- aged out unless necessary      Mammo- utd      Dexa scan- utd       CCS- aged out unless necessary        Objective:     Vitals: BP (!) 117/57 (BP Location: Left Arm, Patient Position: Sitting, Cuff Size: Normal)   Pulse 69   Ht 5\' 5"  (1.651 m)   Wt 137 lb (62.1 kg)   SpO2 99%   BMI 22.80 kg/m   Body mass index is 22.8 kg/m.  Advanced Directives 02/12/2018  Does Patient Have a Medical Advance Directive? Yes  Type of Paramedic of [place];Living will  Does patient want to make changes to medical advance directive? No - Patient declined  Copy of [place] in Chart? No - copy requested    Tobacco Social History   Tobacco Use  Smoking Status Current Every Day Smoker  . Packs/day: 0.25  . Years: 20.00  . Pack years: 5.00  Smokeless Tobacco Never Used     Ready to quit: Not Answered Counseling given: Not Answered   Clinical Intake:  Pre-visit preparation completed: Yes  Pain : No/denies pain     Nutritional Risks: None Diabetes: Yes CBG done?: No(FBS this morning at home was 185) Did pt. bring in CBG monitor from home?: No  How often do you need  to have someone help you when you read instructions, pamphlets, or other written materials from your doctor or pharmacy?: 1 - Never What is the last grade level you completed in school?: 12  Interpreter Needed?: No  Information entered by :: Orlie Dakin, LPN  Past Medical History:  Diagnosis Date  . BCC (basal cell carcinoma of skin)   . Diabetes ([place])   . Glaucoma   . History of TIA (transient ischemic attack) 08/11/2013   12/2012 - [name]   . Hypertension   . Hypothyroidism 08/11/2013  . Microscopic colitis 08/21/2013   2008 [facility] [name].  Normal colonoscopy 2009 repeat as routine in 2019   . Thyroid disease   . 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, [facility]    Past Surgical History:  Procedure Laterality Date  . MOHS SURGERY  2019   Nose bcc    Family History  Problem Relation Age of Onset  . Heart disease Son   . Diabetes Neg Hx    Social History   Socioeconomic History  . Marital status: Married    Spouse name: Rachel Bo  . Number of children: 1  . Years of education: 18  . Highest education level: 12th grade  Occupational History  . 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 Financial resource strain: Not hard at all  . Food insecurity:    Worry: Never true    Inability: Never true  . Transportation needs:    Medical: No    Non-medical: No  Tobacco Use  . Smoking status: Current Every Day Smoker    Packs/day: 0.25    Years: 20.00    Pack years: 5.00  . Smokeless tobacco: Never Used  Substance and Sexual Activity  . Alcohol use: Yes    Alcohol/week: 2.0 standard drinks    Types: 2 Shots of liquor per week    Comment: 2-3 a day  . Drug use: No  . Sexual activity: Not Currently  Lifestyle  . Physical activity:    Days per week: 0 days    Minutes per session: 0 min  . Stress: Not at all  Relationships  . Social connections:    Talks on phone: More than three times a week    Gets together: Never     Attends religious service: Never    Active member of club or organization: No    Attends meetings of clubs or organizations: Never    Relationship status: Married  Other Topics Concern  . Not on file  Social History Narrative   Patient takes care of her husband who has dementia. Doesn't get out much. Drinks 2-3 cups of hot tea daily    Outpatient Encounter Medications as of 02/12/2018  Medication Sig  . AMBULATORY NON FORMULARY MEDICATION Freestyle light test strips Test twice a day  Dx type 2 diabetes E11.9  . amLODipine (NORVASC) 10 MG tablet Take 1 tablet (10 mg total) by mouth daily.  Marland Kitchen aspirin EC 81 MG tablet Take by mouth.  Marland Kitchen atorvastatin (LIPITOR) 40 MG tablet Take 1 tablet (40 mg total) by mouth daily. Due for lab work  . diclofenac sodium (VOLTAREN) 1 % GEL Apply 4 g topically 4 (four) times daily. To affected joint.  . Dorzolamide HCl-Timolol Mal PF 22.3-6.8 MG/ML SOLN Apply to eye.  . folic acid (FOLVITE) 1 MG tablet Take by mouth.  Marland Kitchen FREESTYLE LITE test strip   . hydrochlorothiazide (HYDRODIURIL) 12.5 MG tablet Take 1 tablet (12.5 mg total) by mouth daily.  Marland Kitchen levothyroxine (SYNTHROID, LEVOTHROID) 112 MCG tablet Take 1 tablet (112 mcg total) by mouth daily.  Marland Kitchen lisinopril (PRINIVIL,ZESTRIL) 20 MG tablet Take 1 tablet (20 mg total) by mouth daily.  . methotrexate (RHEUMATREX) 2.5 MG tablet Take by mouth.  . Multiple Vitamin (MULTI-VITAMINS) TABS Take by mouth.  . multivitamin-iron-minerals-folic acid (CENTRUM) chewable tablet Chew by mouth.  . Omega-3 1000 MG CAPS Take by mouth.  Marland Kitchen ZIOPTAN 0.0015 % SOLN   . HUMIRA PEN 40 MG/0.4ML PNKT   . insulin lispro (HUMALOG) 100 UNIT/ML injection Medtronic 630G pump.  Basal 12-6a 0.625, 6a-7p 0.725, 7p-12a 0.625.  Preset bolus:  4/5/6.  ISF 50.  Total daily dose:  40 units/day  . nepafenac (NEVANAC) 0.1 % ophthalmic suspension Place 1 drop into the right eye 3 (three) times a day.  . triamcinolone cream (KENALOG) 0.5 % Apply 1  application topically 2 (two) times daily. To affected areas. (Patient not taking: Reported on 02/12/2018)   No facility-administered encounter medications on file as of 02/12/2018.     Activities of Daily Living In your present state of health, do you have any difficulty performing the following activities: 02/12/2018  Hearing? N  Vision? Y  Comment has glaucoma sees Dr. Manuella Ghazi for this  Difficulty concentrating  or making decisions? N  Walking or climbing stairs? N  Dressing or bathing? N  Doing errands, shopping? N  Preparing Food and eating ? N  Using the Toilet? N  In the past six months, have you accidently leaked urine? N  Do you have problems with loss of bowel control? N  Managing your Medications? N  Managing your Finances? N  Housekeeping or managing your Housekeeping? N  Some recent data might be hidden    Patient Care Team: Gregor Hams, MD as PCP - General (Family Medicine) Janie Morning, MD (Gastroenterology) Gerome Apley, MD as Referring Physician (Endocrinology) Feliz Beam, MD as Referring Physician (Ophthalmology) Ander Slade Carlisle Beers, MD as Referring Physician (Ophthalmology)    Assessment:   This is a routine wellness examination for Baley.Physical assessment deferred to PCP. We will get you an appointment to discuss stress and depression with Dr. Georgina Snell.   Exercise Activities and Dietary recommendations Current Exercise Habits: The patient does not participate in regular exercise at present, Exercise limited by: None identified Diet Eats TV dinners now, doesn't cook anymore as her husband makes fun of her cooking Breakfast: salmon with english muffin Lunch: sandwich and soup or a salad Dinner:  Sometimes meat and vegetables other times a TV dinner     Goals    . stress management     Manage stress at home better with taking care of husband with dementia       Fall Risk Fall Risk  02/12/2018 12/28/2017 10/30/2016 10/22/2015 10/11/2015   Falls in the past year? 0 0 No No No  Comment - - - - Emmi Telephone Survey: data to providers prior to load  Number falls in past yr: - 0 - - -  Injury with Fall? - 0 - - -  Follow up - Falls evaluation completed - - -   Is the patient's home free of loose throw rugs in walkways, pet beds, electrical cords, etc?   yes      Grab bars in the bathroom? no      Handrails on the stairs?   yes      Adequate lighting?   yes  Depression Screen PHQ 2/9 Scores 02/12/2018 06/19/2017 10/30/2016 10/22/2015  PHQ - 2 Score 2 5 0 0  PHQ- 9 Score 6 16 - -     Cognitive Function     6CIT Screen 02/12/2018  What Year? 0 points  What month? 0 points  What time? 0 points  Count back from 20 0 points  Months in reverse 0 points  Repeat phrase 0 points  Total Score 0    Immunization History  Administered Date(s) Administered  . Influenza Whole 11/30/2016  . Influenza, High Dose Seasonal PF 10/22/2015, 10/30/2016  . Influenza,inj,Quad PF,6+ Mos 11/11/2013, 09/27/2017  . Influenza-Unspecified 11/14/2014  . Pneumococcal Conjugate-13 10/22/2015  . Pneumococcal Polysaccharide-23 12/25/2012, 03/06/2017  . Tdap 07/26/2015  . Zoster 11/04/2015    Screening Tests Health Maintenance  Topic Date Due  . OPHTHALMOLOGY EXAM  01/05/2018  . FOOT EXAM  03/06/2018  . HEMOGLOBIN A1C  06/28/2018  . TETANUS/TDAP  07/25/2025  . INFLUENZA VACCINE  Completed  . DEXA SCAN  Completed  . PNA vac Low Risk Adult  Completed       Plan:      Ms. Sitzmann , Thank you for taking time to come for your Medicare Wellness Visit. I appreciate your ongoing commitment to your health goals. Please review the following plan  we discussed and let me know if I can assist you in the future.   Please schedule your next medicare wellness visit with me in 1 yr. Continue doing brain stimulating activities (puzzles, reading, adult coloring books, staying active) to keep memory sharp.    These are the goals we discussed: Goals     . stress management     Manage stress at home better with taking care of husband with dementia       This is a list of the screening recommended for you and due dates:  Health Maintenance  Topic Date Due  . Eye exam for diabetics  01/05/2018  . Complete foot exam   03/06/2018  . Hemoglobin A1C  06/28/2018  . Tetanus Vaccine  07/25/2025  . Flu Shot  Completed  . DEXA scan (bone density measurement)  Completed  . Pneumonia vaccines  Completed      I have personally reviewed and noted the following in the patient's chart:   . Medical and social history . Use of alcohol, tobacco or illicit drugs  . Current medications and supplements . Functional ability and status . Nutritional status . Physical activity . Advanced directives . List of other physicians . Hospitalizations, surgeries, and ER visits in previous 12 months . Vitals . Screenings to include cognitive, depression, and falls . Referrals and appointments  In addition, I have reviewed and discussed with patient certain preventive protocols, quality metrics, and best practice recommendations. A written personalized care plan for preventive services as well as general preventive health recommendations were provided to patient.     Joanne Chars, LPN  [id number]

## 2018-02-12 ENCOUNTER — Ambulatory Visit (INDEPENDENT_AMBULATORY_CARE_PROVIDER_SITE_OTHER): Payer: Medicare Other | Admitting: *Deleted

## 2018-02-12 VITALS — BP 117/57 | HR 69 | Ht 65.0 in | Wt 137.0 lb

## 2018-02-12 DIAGNOSIS — Z0000 Encounter for general adult medical examination without abnormal findings: Secondary | ICD-10-CM

## 2018-02-12 NOTE — Patient Instructions (Signed)
[name] , Thank you for taking time to come for your Medicare Wellness Visit. I appreciate your ongoing commitment to your health goals. Please review the following plan we discussed and let me know if I can assist you in the future.   Please schedule your next medicare wellness visit with me in 1 yr. Continue doing brain stimulating activities (puzzles, reading, adult coloring books, staying active) to keep memory sharp.  These are the goals we discussed: Goals    . stress management     Manage stress at home better with taking care of husband with dementia     Stress Stress is a normal reaction to life events. Stress is what you feel when life demands more than you are used to, or more than you think you can handle. Some stress can be useful, such as studying for a test or meeting a deadline at work. Stress that occurs too often or for too long can cause problems. It can affect your emotional health and interfere with relationships and normal daily activities. Too much stress can weaken your body's defense system (immune system) and increase your risk for physical illness. If you already have a medical problem, stress can make it worse. What are the causes? All sorts of life events can cause stress. An event that causes stress for one person may not be stressful for another person. Major life events, whether positive or negative, commonly cause stress. Examples include:  Losing a job or starting a new job.  Losing a loved one.  Moving to a new town or home.  Getting married or divorced.  Having a baby.  Injury or illness. Less obvious life events can also cause stress, especially if they occur day after day or in combination with each other. Examples include:  Working long hours.  Driving in traffic.  Caring for children.  Being in debt.  Being in a difficult relationship. What are the signs or symptoms? Stress can cause emotional symptoms, including:  Anxiety. This is  feeling worried, afraid, on edge, overwhelmed, or out of control.  Anger, including irritation or impatience.  Depression. This is feeling sad, down, helpless, or guilty.  Trouble focusing, remembering, or making decisions. Stress can cause physical symptoms, including:  Aches and pains. These may affect your head, neck, back, stomach, or other areas of your body.  Tight muscles or a clenched jaw.  Low energy.  Trouble sleeping. Stress can cause unhealthy behaviors, including:  Eating to feel better (overeating) or skipping meals.  Working too much or putting off tasks.  Smoking, drinking alcohol, or using drugs to feel better. How is this diagnosed? Stress is diagnosed through an assessment by your health care provider. He or she may diagnose this condition based on:  Your symptoms and any stressful life events.  Your medical history.  Tests to rule out other causes of your symptoms. Depending on your condition, your health care provider may refer you to a specialist for further evaluation. How is this treated?  Stress management techniques are the recommended treatment for stress. Medicine is not typically recommended for the treatment of stress. Techniques to reduce your reaction to stressful life events include:  Stress identification. Monitor yourself for symptoms of stress and identify what causes stress for you. These skills may help you to avoid or prepare for stressful events.  Time management. Set your priorities, keep a calendar of events, and learn to say "no." Taking these actions can help you avoid making too  many commitments. Techniques for coping with stress include:  Rethinking the problem. Try to think realistically about stressful events rather than ignoring them or overreacting. Try to find the positives in a stressful situation rather than focusing on the negatives.  Exercise. Physical exercise can release both physical and emotional tension. The key  is to find a form of exercise that you enjoy and do it regularly.  Relaxation techniques. These relax the body and mind. The key is to find one or more that you enjoy and use the technique(s) regularly. Examples include: ? Meditation, deep breathing, or progressive relaxation techniques. ? Yoga or tai chi. ? Biofeedback, mindfulness techniques, or journaling. ? Listening to music, being out in nature, or participating in other hobbies.  Practicing a healthy lifestyle. Eat a balanced diet, drink plenty of water, limit or avoid caffeine, and get plenty of sleep.  Having a strong support network. Spend time with family, friends, or other people you enjoy being around. Express your feelings and talk things over with someone you trust. Counseling or talk therapy with a mental health professional may be helpful if you are having trouble managing stress on your own. Follow these instructions at home: Lifestyle   Avoid drugs.  Do not use any products that contain nicotine or tobacco, such as cigarettes and e-cigarettes. If you need help quitting, ask your health care provider.  Limit alcohol intake to no more than 1 drink a day for nonpregnant women and 2 drinks a day for men. One drink equals 12 oz of beer, 5 oz of wine, or 1 oz of hard liquor.  Do not use alcohol or drugs to relax.  Eat a balanced diet that includes fresh fruits and vegetables, whole grains, lean meats, fish, eggs, and beans, and low-fat dairy. Avoid processed foods and foods high in added fat, sugar, and salt.  Exercise at least 30 minutes on 5 or more days each week.  Get 7-8 hours of sleep each night. General instructions   Practice stress management techniques as discussed with your health care provider.  Drink enough fluid to keep your urine clear or pale yellow.  Take over-the-counter and prescription medicines only as told by your health care provider.  Keep all follow-up visits as told by your health care  provider. This is important. Contact a health care provider if:  Your symptoms get worse.  You have new symptoms.  You feel overwhelmed by your problems and can no longer manage them on your own. Get help right away if:  You have thoughts of hurting yourself or others. If you ever feel like you may hurt yourself or others, or have thoughts about taking your own life, get help right away. You can go to your nearest emergency department or call:  Your local emergency services (911 in the [place]  A suicide crisis helpline, such as the [place] at (770)544-4489. This is open 24 hours a day. Summary  Stress is a normal reaction to life events. It can cause problems if it happens too often or for too long.  Practicing stress management techniques is the best way to treat stress.  Counseling or talk therapy with a mental health professional may be helpful if you are having trouble managing stress on your own. This information is not intended to replace advice given to you by your health care provider. Make sure you discuss any questions you have with your health care provider. Document Released: 07/26/2000 Document Revised: 03/22/2016 Document Reviewed:  03/22/2016 Elsevier Interactive Patient Education  [employer].

## 2018-02-14 ENCOUNTER — Encounter: Payer: Self-pay | Admitting: Family Medicine

## 2018-02-19 ENCOUNTER — Ambulatory Visit: Payer: Medicare Other | Admitting: Family Medicine

## 2018-03-01 DIAGNOSIS — E1049 Type 1 diabetes mellitus with other diabetic neurological complication: Secondary | ICD-10-CM | POA: Diagnosis not present

## 2018-03-01 DIAGNOSIS — E1069 Type 1 diabetes mellitus with other specified complication: Secondary | ICD-10-CM | POA: Diagnosis not present

## 2018-03-01 DIAGNOSIS — E785 Hyperlipidemia, unspecified: Secondary | ICD-10-CM | POA: Diagnosis not present

## 2018-03-01 DIAGNOSIS — E1065 Type 1 diabetes mellitus with hyperglycemia: Secondary | ICD-10-CM | POA: Diagnosis not present

## 2018-03-01 DIAGNOSIS — E1021 Type 1 diabetes mellitus with diabetic nephropathy: Secondary | ICD-10-CM | POA: Diagnosis not present

## 2018-03-01 DIAGNOSIS — E039 Hypothyroidism, unspecified: Secondary | ICD-10-CM | POA: Diagnosis not present

## 2018-03-01 LAB — TSH: TSH: 3.57 (ref 0.41–5.90)

## 2018-03-01 LAB — HEMOGLOBIN A1C: HEMOGLOBIN A1C: 8.7

## 2018-03-04 ENCOUNTER — Telehealth: Payer: Self-pay | Admitting: Family Medicine

## 2018-03-04 NOTE — Telephone Encounter (Signed)
Received notes from Endocrinology. A1c 8.7 (abstracted).  Trying to prevent hypoglycemia. Disease received as well in normal limits.  Continue current regimen follow-up 3 months.

## 2018-03-12 ENCOUNTER — Other Ambulatory Visit: Payer: Self-pay | Admitting: Family Medicine

## 2018-03-12 DIAGNOSIS — I10 Essential (primary) hypertension: Secondary | ICD-10-CM

## 2018-03-13 DIAGNOSIS — H0288A Meibomian gland dysfunction right eye, upper and lower eyelids: Secondary | ICD-10-CM | POA: Diagnosis not present

## 2018-03-13 DIAGNOSIS — H209 Unspecified iridocyclitis: Secondary | ICD-10-CM | POA: Diagnosis not present

## 2018-03-13 DIAGNOSIS — H16223 Keratoconjunctivitis sicca, not specified as Sjogren's, bilateral: Secondary | ICD-10-CM | POA: Diagnosis not present

## 2018-03-13 DIAGNOSIS — H4043X3 Glaucoma secondary to eye inflammation, bilateral, severe stage: Secondary | ICD-10-CM | POA: Diagnosis not present

## 2018-03-13 DIAGNOSIS — H0288B Meibomian gland dysfunction left eye, upper and lower eyelids: Secondary | ICD-10-CM | POA: Diagnosis not present

## 2018-03-14 DIAGNOSIS — Z79899 Other long term (current) drug therapy: Secondary | ICD-10-CM | POA: Diagnosis not present

## 2018-03-14 DIAGNOSIS — H401120 Primary open-angle glaucoma, left eye, stage unspecified: Secondary | ICD-10-CM | POA: Diagnosis not present

## 2018-03-14 DIAGNOSIS — H30031 Focal chorioretinal inflammation, peripheral, right eye: Secondary | ICD-10-CM | POA: Diagnosis not present

## 2018-03-14 DIAGNOSIS — H4089 Other specified glaucoma: Secondary | ICD-10-CM | POA: Diagnosis not present

## 2018-03-14 DIAGNOSIS — H3581 Retinal edema: Secondary | ICD-10-CM | POA: Diagnosis not present

## 2018-03-14 DIAGNOSIS — H4041X1 Glaucoma secondary to eye inflammation, right eye, mild stage: Secondary | ICD-10-CM | POA: Diagnosis not present

## 2018-03-14 DIAGNOSIS — Z961 Presence of intraocular lens: Secondary | ICD-10-CM | POA: Diagnosis not present

## 2018-03-14 DIAGNOSIS — H209 Unspecified iridocyclitis: Secondary | ICD-10-CM | POA: Diagnosis not present

## 2018-03-14 DIAGNOSIS — H44111 Panuveitis, right eye: Secondary | ICD-10-CM | POA: Diagnosis not present

## 2018-04-01 ENCOUNTER — Ambulatory Visit: Payer: Medicare Other | Admitting: Family Medicine

## 2018-04-03 ENCOUNTER — Encounter: Payer: Self-pay | Admitting: Family Medicine

## 2018-04-03 ENCOUNTER — Ambulatory Visit (INDEPENDENT_AMBULATORY_CARE_PROVIDER_SITE_OTHER): Payer: Medicare Other

## 2018-04-03 ENCOUNTER — Ambulatory Visit (INDEPENDENT_AMBULATORY_CARE_PROVIDER_SITE_OTHER): Payer: Medicare Other | Admitting: Family Medicine

## 2018-04-03 VITALS — BP 120/48 | HR 64 | Wt 136.0 lb

## 2018-04-03 DIAGNOSIS — M5416 Radiculopathy, lumbar region: Secondary | ICD-10-CM

## 2018-04-03 DIAGNOSIS — E782 Mixed hyperlipidemia: Secondary | ICD-10-CM

## 2018-04-03 DIAGNOSIS — E1165 Type 2 diabetes mellitus with hyperglycemia: Secondary | ICD-10-CM | POA: Diagnosis not present

## 2018-04-03 DIAGNOSIS — M5117 Intervertebral disc disorders with radiculopathy, lumbosacral region: Secondary | ICD-10-CM | POA: Diagnosis not present

## 2018-04-03 DIAGNOSIS — I10 Essential (primary) hypertension: Secondary | ICD-10-CM

## 2018-04-03 DIAGNOSIS — M5136 Other intervertebral disc degeneration, lumbar region: Secondary | ICD-10-CM

## 2018-04-03 DIAGNOSIS — E113599 Type 2 diabetes mellitus with proliferative diabetic retinopathy without macular edema, unspecified eye: Secondary | ICD-10-CM

## 2018-04-03 DIAGNOSIS — Z794 Long term (current) use of insulin: Secondary | ICD-10-CM | POA: Diagnosis not present

## 2018-04-03 DIAGNOSIS — IMO0002 Reserved for concepts with insufficient information to code with codable children: Secondary | ICD-10-CM

## 2018-04-03 NOTE — Progress Notes (Signed)
Wallis Spizzirri is a 78 y.o. female who presents to [facility] today for hypertension diabetes and leg pain   Patient has a history of difficult to control diabetes.  She is currently managed with endocrinology and had an A1c of 8.6 at her last exam in January.  She denies any episodes of hypoglycemia or severe hyperglycemia.  She feels pretty well.  Additionally she has a history of hypertension.  Is managed with amlodipine, lisinopril.  Her last metabolic panel was in August 2019.  She takes medications listed below.  No chest pain shortness of breath lightheadedness or dizziness.  Kanika continues to experience some pain in her right leg.  She was seen for this in November 2019 and had x-ray of her knee showing moderate lateral compartment degenerative changes.  She notes that her pain is now more confined to her right lateral calf.  She does not have much pain in the right lateral knee.  Pain is worse with prolonged sitting in a flexed position.  Her pain is better with standing and walking.  She also has pain at bedtime.  ROS as above:  Exam:  BP (!) 120/48   Pulse 64   Wt 136 lb (61.7 kg)   BMI 22.63 kg/m  Wt Readings from Last 5 Encounters:  04/03/18 136 lb (61.7 kg)  02/12/18 137 lb (62.1 kg)  12/28/17 140 lb (63.5 kg)  09/27/17 140 lb (63.5 kg)  06/19/17 146 lb (66.2 kg)    Gen: Well NAD HEENT: EOMI,  MMM Lungs: Normal work of breathing. CTABL Heart: RRR no MRG Abd: NABS, Soft. Nondistended, Nontender Exts: Brisk capillary refill, warm and well perfused.  MSK: L-spine nontender to midline normal lumbar motion Lower extremity reflexes are equal and normal bilaterally. Sensation is intact throughout. Intact strength. Negative slump test bilaterally  Right knee: Normal-appearing normal motion with crepitations.  Not particularly tender.  Stable ligamentous  exam.    Lab and Radiology Results X-ray images L-spine personally independently reviewed Significant DDD at L5-S1.  No fractures visible.  No significant malalignment. Await formal radiology review   Assessment and Plan: 79 y.o. female with  Right leg pain: Concerning for L5 lumbar radicular pain.  Pain could also be referred from lateral knee however that is less likely.  Plan for x-ray and physical therapy.  Discussed gabapentin patient declined.  Would like to avoid steroids if possible given history of previous hypoglycemic events with steroid administration.  Recheck in about 2 months.  Hypertension: Blood pressure controlled.  Continue current regimen.  Check metabolic panel.  Diabetes: Doing reasonably well continue management with endocrinology.  Hyperlipidemia: On statin.  Check direct LDL.  PDMP not reviewed this encounter. Orders Placed This Encounter  Procedures  . DG Lumbar Spine Complete    Standing Status:   Future    Number of Occurrences:   1    Standing Expiration Date:   06/02/2019    Order Specific Question:   Reason for Exam (SYMPTOM  OR DIAGNOSIS REQUIRED)    Answer:   suspect rt L5 radicuppathy    Order Specific Question:   Preferred imaging location?    Answer:   [facility]    Order Specific Question:   Radiology Contrast Protocol - do NOT remove file path    Answer:   \\[email or web address]  . Basic metabolic panel    This external order was created through the Results Console.  . CBC  .  COMPLETE METABOLIC PANEL WITH GFR  . LDL cholesterol, direct  . Ambulatory referral to Physical Therapy    Referral Priority:   Routine    Referral Type:   Physical Medicine    Referral Reason:   Specialty Services Required    Requested Specialty:   Physical Therapy  . HM Diabetes Foot Exam   No orders of the defined types were placed in this encounter.    Historical information moved to improve visibility of  documentation.  Past Medical History:  Diagnosis Date  . BCC (basal cell carcinoma of skin)   . Diabetes ([place])   . Glaucoma   . History of TIA (transient ischemic attack) 08/11/2013   12/2012 - [name]   . Hypertension   . Hypothyroidism 08/11/2013  . Microscopic colitis 08/21/2013   2008 [facility] [name].  Normal colonoscopy 2009 repeat as routine in 2019   . Thyroid disease   . 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, [facility]    Past Surgical History:  Procedure Laterality Date  . MOHS SURGERY  2019   Nose bcc    Social History   Tobacco Use  . Smoking status: Current Every Day Smoker    Packs/day: 0.25    Years: 20.00    Pack years: 5.00  . Smokeless tobacco: Never Used  Substance Use Topics  . Alcohol use: Yes    Alcohol/week: 2.0 standard drinks    Types: 2 Shots of liquor per week    Comment: 2-3 a day   family history includes Heart disease in her son.  Medications: Current Outpatient Medications  Medication Sig Dispense Refill  . AMBULATORY NON FORMULARY MEDICATION Freestyle light test strips Test twice a day  Dx type 2 diabetes E11.9 100 each 11  . amLODipine (NORVASC) 10 MG tablet TAKE 1 TABLET DAILY 90 tablet 4  . aspirin EC 81 MG tablet Take by mouth.    Marland Kitchen atorvastatin (LIPITOR) 40 MG tablet Take 1 tablet (40 mg total) by mouth daily. Due for lab work 90 tablet 3  . diclofenac sodium (VOLTAREN) 1 % GEL Apply 4 g topically 4 (four) times daily. To affected joint. 500 g 11  . Dorzolamide HCl-Timolol Mal PF 22.3-6.8 MG/ML SOLN Apply to eye.    . folic acid (FOLVITE) 1 MG tablet Take by mouth.    Marland Kitchen FREESTYLE LITE test strip     . HUMIRA PEN 40 MG/0.4ML PNKT     . hydrochlorothiazide (HYDRODIURIL) 12.5 MG tablet Take 1 tablet (12.5 mg total) by mouth daily. 90 tablet 3  . insulin lispro (HUMALOG) 100 UNIT/ML injection Medtronic 630G pump.  Basal 12-6a 0.625, 6a-7p 0.725, 7p-12a 0.625.  Preset bolus:  4/5/6.  ISF 50.  Total daily  dose:  40 units/day    . levothyroxine (SYNTHROID, LEVOTHROID) 112 MCG tablet Take 1 tablet (112 mcg total) by mouth daily. 90 tablet 3  . lisinopril (PRINIVIL,ZESTRIL) 20 MG tablet Take 1 tablet (20 mg total) by mouth daily. 90 tablet 3  . methotrexate (RHEUMATREX) 2.5 MG tablet Take by mouth.    . Multiple Vitamin (MULTI-VITAMINS) TABS Take by mouth.    . multivitamin-iron-minerals-folic acid (CENTRUM) chewable tablet Chew by mouth.    . nepafenac (NEVANAC) 0.1 % ophthalmic suspension Place 1 drop into the right eye 3 (three) times a day.    . Omega-3 1000 MG CAPS Take by mouth.    . triamcinolone cream (KENALOG) 0.5 % Apply 1 application topically 2 (  two) times daily. To affected areas. 30 g 3  . ZIOPTAN 0.0015 % SOLN      No current facility-administered medications for this visit.    Allergies  Allergen Reactions  . Clindamycin/Lincomycin Rash  . Sulfa Antibiotics Rash  . Valacyclovir Hcl Rash     Discussed warning signs or symptoms. Please see discharge instructions. Patient expresses understanding.

## 2018-04-03 NOTE — Patient Instructions (Addendum)
Thank you for coming in today. Get xray on the way out.  Attend PT for the back / leg pain.  Recheck in about 2 months to follow leg pain.  Let me know if the pain worsens. We can use gabapentin.   Get labs today.

## 2018-04-04 ENCOUNTER — Encounter: Payer: Self-pay | Admitting: Family Medicine

## 2018-04-04 DIAGNOSIS — I700 Atherosclerosis of aorta: Secondary | ICD-10-CM | POA: Insufficient documentation

## 2018-04-04 LAB — COMPLETE METABOLIC PANEL WITH GFR
AG Ratio: 1.6 (calc) (ref 1.0–2.5)
ALT: 15 U/L (ref 6–29)
AST: 18 U/L (ref 10–35)
Albumin: 3.9 g/dL (ref 3.6–5.1)
Alkaline phosphatase (APISO): 67 U/L (ref 37–153)
BUN/Creatinine Ratio: 25 (calc) — ABNORMAL HIGH (ref 6–22)
BUN: 25 mg/dL (ref 7–25)
CO2: 32 mmol/L (ref 20–32)
Calcium: 9.3 mg/dL (ref 8.6–10.4)
Chloride: 102 mmol/L (ref 98–110)
Creat: 1 mg/dL — ABNORMAL HIGH (ref 0.60–0.93)
GFR, Est African American: 62 mL/min/{1.73_m2} (ref >=60)
GFR, Est Non African American: 54 mL/min/{1.73_m2} — ABNORMAL LOW (ref >=60)
GLUCOSE: 391 mg/dL — AB (ref 65–99)
Globulin: 2.4 g/dL (calc) (ref 1.9–3.7)
Potassium: 4.3 mmol/L (ref 3.5–5.3)
Sodium: 139 mmol/L (ref 135–146)
Total Bilirubin: 1.3 mg/dL — ABNORMAL HIGH (ref 0.2–1.2)
Total Protein: 6.3 g/dL (ref 6.1–8.1)

## 2018-04-04 LAB — LDL CHOLESTEROL, DIRECT: Direct LDL: 38 mg/dL (ref <100)

## 2018-04-04 LAB — CBC
HCT: 40.2 % (ref 35.0–45.0)
Hemoglobin: 13.5 g/dL (ref 11.7–15.5)
MCH: 32.8 pg (ref 27.0–33.0)
MCHC: 33.6 g/dL (ref 32.0–36.0)
MCV: 97.8 fL (ref 80.0–100.0)
MPV: 10.5 fL (ref 7.5–12.5)
Platelets: 283 10*3/uL (ref 140–400)
RBC: 4.11 10*6/uL (ref 3.80–5.10)
RDW: 13 % (ref 11.0–15.0)
WBC: 3.3 10*3/uL — AB (ref 3.8–10.8)

## 2018-04-11 ENCOUNTER — Other Ambulatory Visit: Payer: Self-pay

## 2018-04-11 ENCOUNTER — Encounter: Payer: Self-pay | Admitting: Physical Therapy

## 2018-04-11 ENCOUNTER — Ambulatory Visit (INDEPENDENT_AMBULATORY_CARE_PROVIDER_SITE_OTHER): Payer: Medicare Other | Admitting: Physical Therapy

## 2018-04-11 DIAGNOSIS — M5416 Radiculopathy, lumbar region: Secondary | ICD-10-CM | POA: Diagnosis not present

## 2018-04-11 DIAGNOSIS — M25561 Pain in right knee: Secondary | ICD-10-CM | POA: Diagnosis not present

## 2018-04-11 DIAGNOSIS — G8929 Other chronic pain: Secondary | ICD-10-CM | POA: Diagnosis not present

## 2018-04-11 DIAGNOSIS — M6281 Muscle weakness (generalized): Secondary | ICD-10-CM | POA: Diagnosis not present

## 2018-04-11 NOTE — Patient Instructions (Signed)
Access Code: [place]  URL: [email or web address]  Date: 04/11/2018  Prepared by: Faustino Congress   Exercises  Prone on Elbows Stretch - 1 reps - 1 sets - 3 min hold - 2x daily - 7x weekly  Prone Press Up - 10 reps - 1 sets - 1-2 sec hold - 2x daily - 7x weekly  Supine Piriformis Stretch with Foot on Ground - 3 reps - 1 sets - 30 sec hold - 2x daily - 7x weekly  Supine Hamstring Stretch with Strap - 3 reps - 1 sets - 30 sec hold - 2x daily - 7x weekly

## 2018-04-11 NOTE — Therapy (Signed)
[place], [place], [address] Phone: [phone]   Fax:  [phone]  Physical Therapy Evaluation  Patient Details  Name: Carmen Cooper MRN: [id number] Date of Birth: 04/07/1940 Referring Provider (PT): Gregor Hams, MD   Encounter Date: 04/11/2018  PT End of Session - 04/11/18 1312    Visit Number  1    Number of Visits  12    Date for PT Re-Evaluation  05/23/18    Authorization Type  Medicare, Tricare    PT Start Time  1150    PT Stop Time  1235    PT Time Calculation (min)  45 min    Activity Tolerance  Patient tolerated treatment well    Behavior During Therapy  [facility] for tasks assessed/performed       Past Medical History:  Diagnosis Date  . BCC (basal cell carcinoma of skin)   . Diabetes ([place])   . Glaucoma   . History of TIA (transient ischemic attack) 08/11/2013   12/2012 - [name]   . Hypertension   . Hypothyroidism 08/11/2013  . Microscopic colitis 08/21/2013   2008 [facility] [name].  Normal colonoscopy 2009 repeat as routine in 2019   . Thyroid disease   . 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, [facility]     Past Surgical History:  Procedure Laterality Date  . MOHS SURGERY  2019   Nose bcc     There were no vitals filed for this visit.   Subjective Assessment - 04/11/18 1153    Subjective  Pt is a 78 y/o female who presents to OPPT for pain which originated in the Rt knee radiating into Rt calf and foot x 1 year. Pt without known recent injury, but states work injury 14-15 years ago resulting in fx to Rt ankle treated conservatively.     Limitations  Walking;Sitting    How long can you sit comfortably?  1 hour    How long can you walk comfortably?  c/o occasional bucking in Rt knee    Diagnostic tests  xrays: significant DDD L5-S1, OA Rt knee    Patient Stated Goals  improve pain in Rt knee    Currently in Pain?  Yes    Pain Score  5    up to  7/10; at best 5/10   Pain Location  Knee    Pain Orientation  Right;Lateral    Pain Descriptors / Indicators  Burning;Sharp;Nagging    Pain Type  Chronic pain    Pain Radiating Towards  lateral ankle    Pain Onset  More than a month ago    Pain Frequency  Constant    Aggravating Factors   sitting > 1 hour, wakes her up at night    Pain Relieving Factors  repositioning, walking         [facility] PT Assessment - 04/11/18 1159      Assessment   Medical Diagnosis  M54.16 (ICD-10-CM) - Lumbar radiculopathy    Referring Provider (PT)  Gregor Hams, MD    Onset Date/Surgical Date  --   1 year   Hand Dominance  Right    Next MD Visit  06/05/18    Prior Therapy  following hand injury      Precautions   Precautions  None      Restrictions   Weight Bearing Restrictions  No      Balance Screen   Has  the patient fallen in the past 6 months  No    Has the patient had a decrease in activity level because of a fear of falling?   No    Is the patient reluctant to leave their home because of a fear of falling?   No      Home Film/video editor residence    Living Arrangements  Spouse/significant other   husband 79 y/o) has dementia   Available Help at Discharge  Family    Type of [place] to enter    Entrance Stairs-Number of Steps  5    Entrance Stairs-Rails  Right;Left;Can reach both    [place]  Two level    Alternate Level Stairs-Number of Steps  13    Alternate Level Stairs-Rails  Right    Additional Comments  reports occasional buckling when ascending stairs      Prior Function   Level of Independence  Independent    Vocation  Retired    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 [place]  primary caregiver for husband; "literally nothing"      Cognition   Overall Cognitive Status  Within Functional Limits for tasks assessed      Observation/Other Assessments   Focus on Therapeutic  Outcomes (FOTO)   63 (37% limited; predicted 35% limited)      Posture/Postural Control   Posture/Postural Control  Postural limitations    Postural Limitations  Rounded Shoulders;Forward head;Decreased lumbar lordosis      ROM / Strength   AROM / PROM / Strength  AROM;Strength      AROM   Overall AROM Comments  no increase in symptoms with ROM    AROM Assessment Site  Lumbar    Lumbar Flexion  WNL    Lumbar Extension  WNL    Lumbar - Right Side Bend  limited 25%    Lumbar - Left Side Bend  limited 25%      Strength   Strength Assessment Site  Knee;Hip;Ankle    Right/Left Hip  Right;Left    Right Hip Flexion  4/5    Right Hip Extension  3/5    Right Hip ABduction  3/5    Left Hip Flexion  4/5    Left Hip Extension  3/5    Left Hip ABduction  3/5    Right/Left Knee  Right;Left    Right Knee Flexion  3/5    Right Knee Extension  5/5    Left Knee Flexion  3/5    Left Knee Extension  5/5    Right/Left Ankle  Right;Left    Right Ankle Dorsiflexion  5/5    Left Ankle Dorsiflexion  5/5      Flexibility   Soft Tissue Assessment /Muscle Length  yes    Hamstrings  tightness bil; reproduction of Rt knee pain with testing    Piriformis  tightness bil      Palpation   Palpation comment  no tenderness or tightness and inability to reproduce symptoms      Special Tests    Special Tests  Lumbar    Lumbar Tests  Straight Leg Raise      Straight Leg Raise   Findings  Negative                Objective measurements completed on examination: See above findings.  [facility] Adult PT Treatment/Exercise - 04/11/18 1159      Self-Care   Self-Care  Other Self-Care Comments    Other Self-Care Comments   importance of taking care of self when she is primary caregiver for husband      Exercises   Exercises  Lumbar      Lumbar Exercises: Stretches   Passive Hamstring Stretch  Right;1 rep;30 seconds    Passive Hamstring Stretch Limitations  bent knee; education for gentle  stretch    Standing Extension  5 reps    Standing Extension Limitations  improvement in symptoms with repeated extension    Prone on Elbows Stretch  3 reps;60 seconds   continuous   Press Ups  2 reps;5 seconds    Press Ups Limitations  instructed for home    Piriformis Stretch  Right;1 rep;30 seconds             PT Education - 04/11/18 1312    Education Details  see self care, HEP    Person(s) Educated  Patient    Methods  Explanation;Demonstration;Handout    Comprehension  Verbalized understanding;Returned demonstration;Need further instruction          PT Long Term Goals - 04/11/18 1316      PT LONG TERM GOAL #1   Title  indepenedent with HEP    Status  New    Target Date  05/23/18      PT LONG TERM GOAL #2   Title  report centralization of symptoms for improved function and mobility    Status  New    Target Date  05/23/18      PT LONG TERM GOAL #3   Title  report no episodes of Rt knee buckling x 2 weeks for improved function    Status  New    Target Date  05/23/18      PT LONG TERM GOAL #4   Title  FOTO score improved to </= 35% limited for improved function    Status  New    Target Date  05/23/18             Plan - 04/11/18 1312    Clinical Impression Statement  Pt is a 78 y/o female who presents to OPPT with Rt knee pain radiating into Rt ankle, likely due to lumbar radiculopathy.  Pt with improvment in symptoms with extension based program, so plan to continue.  Pt demonstrates decreased strength and stability, decreased flexibility affecting functional mobility.  Pt will benefit from PT to maximize function and address deficits listed.    History and Personal Factors relevant to plan of care:  HTN, glaucoma, DM    Clinical Presentation  Stable    Clinical Decision Making  Low    Rehab Potential  Good    PT Frequency  2x / week    PT Duration  6 weeks    PT Treatment/Interventions  ADLs/Self Care Home Management;Cryotherapy;Electrical  Stimulation;Functional mobility training;Stair training;Gait training;DME Instruction;Ultrasound;Traction;Moist Heat;Therapeutic activities;Therapeutic exercise;Neuromuscular re-education;Patient/family education;Manual techniques;Dry needling;Taping    PT Next Visit Plan  review HEP, continue extension based program if symptoms continue to improve, try traction, core/hip stability    PT Home Exercise Plan  Access Code: [place]     Consulted and Agree with Plan of Care  Patient       Patient will benefit from skilled therapeutic intervention in order to improve the following deficits and impairments:  Pain, Decreased strength, Decreased mobility, Postural dysfunction, Impaired flexibility, Decreased range  of motion  Visit Diagnosis: Radiculopathy, lumbar region - Plan: PT plan of care cert/re-cert  Chronic pain of right knee - Plan: PT plan of care cert/re-cert  Muscle weakness (generalized) - Plan: PT plan of care cert/re-cert     Problem List Patient Active Problem List   Diagnosis Date Noted  . Aortic atherosclerosis ([place]) 04/04/2018  . Right knee DJD 06/19/2017  . Abdominal cramping 03/06/2016  . Fatigue 12/06/2015  . Uncontrolled diabetes mellitus with eye complications ([place]) 61/47/0929  . Uncontrolled diabetes mellitus with neurologic complication ([place]) 57/47/3403  . Uveitis 01/26/2015  . Venous stasis dermatitis 08/12/2014  . 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 Seborrheic keratoses 03/16/2014  . Edema 02/10/2014  . Dysphagia 02/10/2014  . Preventive measure 08/21/2013  . H/O left breast biopsy 08/21/2013  . Microscopic colitis 08/21/2013  . Hyperlipidemia 08/11/2013  . History of TIA (transient ischemic attack) 08/11/2013  . Essential hypertension, benign 08/11/2013  . Osteopenia 08/11/2013  . Hypothyroidism 08/11/2013      Laureen Abrahams, PT, DPT 04/11/18 1:23 PM     [facility] [place], [place], [address] Phone: [phone]   Fax:  [phone]  Name: Carmen Cooper MRN: [id number] Date of Birth: 1940/09/17

## 2018-04-16 ENCOUNTER — Ambulatory Visit (INDEPENDENT_AMBULATORY_CARE_PROVIDER_SITE_OTHER): Payer: Medicare Other | Admitting: Physical Therapy

## 2018-04-16 ENCOUNTER — Encounter: Payer: Self-pay | Admitting: Physical Therapy

## 2018-04-16 DIAGNOSIS — M25561 Pain in right knee: Secondary | ICD-10-CM

## 2018-04-16 DIAGNOSIS — M5416 Radiculopathy, lumbar region: Secondary | ICD-10-CM

## 2018-04-16 DIAGNOSIS — G8929 Other chronic pain: Secondary | ICD-10-CM

## 2018-04-16 DIAGNOSIS — M6281 Muscle weakness (generalized): Secondary | ICD-10-CM

## 2018-04-16 NOTE — Therapy (Signed)
[place], [place], [address] Phone: [phone]   Fax:  [phone]  Physical Therapy Treatment  Patient Details  Name: Carmen Cooper MRN: [id number] Date of Birth: Aug 21, 1940 Referring Provider (PT): Gregor Hams, MD   Encounter Date: 04/16/2018  PT End of Session - 04/16/18 1244    Visit Number  2    Number of Visits  12    Date for PT Re-Evaluation  05/23/18    Authorization Type  Medicare, Tricare    PT Start Time  1116    PT Stop Time  1200    PT Time Calculation (min)  44 min    Activity Tolerance  Patient tolerated treatment well    Behavior During Therapy  [facility] for tasks assessed/performed       Past Medical History:  Diagnosis Date  . BCC (basal cell carcinoma of skin)   . Diabetes ([place])   . Glaucoma   . History of TIA (transient ischemic attack) 08/11/2013   12/2012 - [name]   . Hypertension   . Hypothyroidism 08/11/2013  . Microscopic colitis 08/21/2013   2008 [facility] [name].  Normal colonoscopy 2009 repeat as routine in 2019   . Thyroid disease   . 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, [facility]     Past Surgical History:  Procedure Laterality Date  . MOHS SURGERY  2019   Nose bcc     There were no vitals filed for this visit.  Subjective Assessment - 04/16/18 1120    Subjective  having burning sensation in Rt lateral calf. "It feels like when I got a blood clot before."  has only done exercises a few times, states husband is "quite demanding"    Patient Stated Goals  improve pain in Rt knee    Currently in Pain?  Yes    Pain Score  4     Pain Location  Knee    Pain Orientation  Right;Lateral    Pain Descriptors / Indicators  Sharp;Burning;Nagging    Pain Type  Chronic pain    Pain Onset  More than a month ago    Pain Frequency  Constant    Aggravating Factors   sitting > 1 hour, wakes her up at night    Pain Relieving Factors   repositioning, walking                       [facility] Adult PT Treatment/Exercise - 04/16/18 1122      Self-Care   Other Self-Care Comments   recommended she seek counseling to help with caring for husband with dementia      Lumbar Exercises: Stretches   Passive Hamstring Stretch  Right;Left;2 reps;30 seconds    Passive Hamstring Stretch Limitations  bent knee; education for gentle stretch    Prone on Elbows Stretch  3 reps;60 seconds   continuous   Piriformis Stretch  Right;Left;2 reps;30 seconds      Lumbar Exercises: Aerobic   Nustep  L5 x 6 min      Lumbar Exercises: Supine   Pelvic Tilt  10 reps;5 seconds    Clam  10 reps    Clam Limitations  alternating; with pelvic tilt    Bridge  10 reps;5 seconds      Lumbar Exercises: Prone   Straight Leg Raise  10 reps;2 seconds    Straight Leg Raises Limitations  alternating  Other Prone Lumbar Exercises  prone press 10x5 sec                  PT Long Term Goals - 04/11/18 1316      PT LONG TERM GOAL #1   Title  indepenedent with HEP    Status  New    Target Date  05/23/18      PT LONG TERM GOAL #2   Title  report centralization of symptoms for improved function and mobility    Status  New    Target Date  05/23/18      PT LONG TERM GOAL #3   Title  report no episodes of Rt knee buckling x 2 weeks for improved function    Status  New    Target Date  05/23/18      PT LONG TERM GOAL #4   Title  FOTO score improved to </= 35% limited for improved function    Status  New    Target Date  05/23/18            Plan - 04/16/18 1244    Clinical Impression Statement  Pt present today with limited compliance with exercises, and pain slightly decreased.  Pt tolerated session well today.  No goals met as only 2nd visit.    PT Frequency  2x / week    PT Duration  6 weeks    PT Treatment/Interventions  ADLs/Self Care Home Management;Cryotherapy;Electrical Stimulation;Functional mobility  training;Stair training;Gait training;DME Instruction;Ultrasound;Traction;Moist Heat;Therapeutic activities;Therapeutic exercise;Neuromuscular re-education;Patient/family education;Manual techniques;Dry needling;Taping    PT Next Visit Plan  review HEP, continue extension based program if symptoms continue to improve, try traction, core/hip stability    PT Home Exercise Plan  Access Code: [place]     Consulted and Agree with Plan of Care  Patient       Patient will benefit from skilled therapeutic intervention in order to improve the following deficits and impairments:  Pain, Decreased strength, Decreased mobility, Postural dysfunction, Impaired flexibility, Decreased range of motion  Visit Diagnosis: Radiculopathy, lumbar region  Chronic pain of right knee  Muscle weakness (generalized)     Problem List Patient Active Problem List   Diagnosis Date Noted  . Aortic atherosclerosis ([place]) 04/04/2018  . Right knee DJD 06/19/2017  . Abdominal cramping 03/06/2016  . Fatigue 12/06/2015  . Uncontrolled diabetes mellitus with eye complications ([place]) 16/11/9602  . Uncontrolled diabetes mellitus with neurologic complication ([place]) 54/10/8117  . Uveitis 01/26/2015  . Venous stasis dermatitis 08/12/2014  . 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 Seborrheic keratoses 03/16/2014  . Edema 02/10/2014  . Dysphagia 02/10/2014  . Preventive measure 08/21/2013  . H/O left breast biopsy 08/21/2013  . Microscopic colitis 08/21/2013  . Hyperlipidemia 08/11/2013  . History of TIA (transient ischemic attack) 08/11/2013  . Essential hypertension, benign 08/11/2013  . Osteopenia 08/11/2013  . Hypothyroidism 08/11/2013      Laureen Abrahams, PT, DPT 04/16/18 12:47 PM    [facility] [place], [place], [address] Phone: [phone]   Fax:  [phone]  Name: Carmen Cooper MRN: [id number] Date of Birth: Feb 11, 1941

## 2018-04-19 ENCOUNTER — Ambulatory Visit (INDEPENDENT_AMBULATORY_CARE_PROVIDER_SITE_OTHER): Payer: Medicare Other | Admitting: Physical Therapy

## 2018-04-19 ENCOUNTER — Encounter: Payer: Self-pay | Admitting: Physical Therapy

## 2018-04-19 DIAGNOSIS — M25561 Pain in right knee: Secondary | ICD-10-CM | POA: Diagnosis not present

## 2018-04-19 DIAGNOSIS — G8929 Other chronic pain: Secondary | ICD-10-CM | POA: Diagnosis not present

## 2018-04-19 DIAGNOSIS — M5416 Radiculopathy, lumbar region: Secondary | ICD-10-CM

## 2018-04-19 DIAGNOSIS — M6281 Muscle weakness (generalized): Secondary | ICD-10-CM | POA: Diagnosis not present

## 2018-04-19 NOTE — Therapy (Signed)
[place], [place], [address] Phone: [phone]   Fax:  [phone]  Physical Therapy Treatment  Patient Details  Name: Carmen Cooper MRN: [id number] Date of Birth: 12-Mar-1940 Referring Provider (PT): Gregor Hams, MD   Encounter Date: 04/19/2018  PT End of Session - 04/19/18 0932    Visit Number  3    Number of Visits  12    Date for PT Re-Evaluation  05/23/18    Authorization Type  Medicare, Tricare    PT Start Time  0800    PT Stop Time  0902    PT Time Calculation (min)  62 min    Activity Tolerance  Patient tolerated treatment well    Behavior During Therapy  [facility] for tasks assessed/performed       Past Medical History:  Diagnosis Date  . BCC (basal cell carcinoma of skin)   . Diabetes ([place])   . Glaucoma   . History of TIA (transient ischemic attack) 08/11/2013   12/2012 - [name]   . Hypertension   . Hypothyroidism 08/11/2013  . Microscopic colitis 08/21/2013   2008 [facility] [name].  Normal colonoscopy 2009 repeat as routine in 2019   . Thyroid disease   . 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, [facility]     Past Surgical History:  Procedure Laterality Date  . MOHS SURGERY  2019   Nose bcc     There were no vitals filed for this visit.  Subjective Assessment - 04/19/18 0801    Subjective  "it's been bad this week for some reason."      Patient Stated Goals  improve pain in Rt knee    Currently in Pain?  Yes    Pain Score  8     Pain Location  Knee    Pain Orientation  Right;Lateral    Pain Descriptors / Indicators  Sharp;Burning;Nagging    Pain Type  Chronic pain    Pain Onset  More than a month ago    Pain Frequency  Constant    Aggravating Factors   sitting > 1 hour, wakes her up at night    Pain Relieving Factors  repositioning, walking                       [facility] Adult PT Treatment/Exercise - 04/19/18 0806      Lumbar  Exercises: Stretches   Standing Extension  10 reps    Standing Extension Limitations  improvement in symptoms with repeated extension    Prone on Elbows Stretch  3 reps;60 seconds   continuous   Press Ups  10 reps   2 seconds   Press Ups Limitations  limited mobility    Quadruped Mid Back Stretch  3 reps;10 seconds    Quadruped Mid Back Stretch Limitations  childs pose    Piriformis Stretch  Right;Left;2 reps;30 seconds      Lumbar Exercises: Aerobic   Nustep  L5 x 6 min      Lumbar Exercises: Supine   Pelvic Tilt  10 reps;5 seconds    Bridge  10 reps;5 seconds    Other Supine Lumbar Exercises  feet on green physioball: isometric hip ext alternating x 10 reps; bridging x 10 reps      Lumbar Exercises: Quadruped   Madcat/Old Horse  10 reps    Madcat/Old Horse Limitations  min cues for technique  Modalities   Modalities  Traction      Traction   Type of Traction  Lumbar    Min (lbs)  45    Max (lbs)  35    Hold Time  60    Rest Time  20    Time  15                  PT Long Term Goals - 04/11/18 1316      PT LONG TERM GOAL #1   Title  indepenedent with HEP    Status  New    Target Date  05/23/18      PT LONG TERM GOAL #2   Title  report centralization of symptoms for improved function and mobility    Status  New    Target Date  05/23/18      PT LONG TERM GOAL #3   Title  report no episodes of Rt knee buckling x 2 weeks for improved function    Status  New    Target Date  05/23/18      PT LONG TERM GOAL #4   Title  FOTO score improved to </= 35% limited for improved function    Status  New    Target Date  05/23/18            Plan - 04/19/18 0933    Clinical Impression Statement  Pt reported increased pain today; and symptoms resolve with extension based exercises.  Pt instructed to perform extension based program    PT Frequency  2x / week    PT Duration  6 weeks    PT Treatment/Interventions  ADLs/Self Care Home  Management;Cryotherapy;Electrical Stimulation;Functional mobility training;Stair training;Gait training;DME Instruction;Ultrasound;Traction;Moist Heat;Therapeutic activities;Therapeutic exercise;Neuromuscular re-education;Patient/family education;Manual techniques;Dry needling;Taping    PT Next Visit Plan  review HEP, continue extension based program if symptoms continue to improve, try traction, core/hip stability    PT Home Exercise Plan  Access Code: [place]     Consulted and Agree with Plan of Care  Patient       Patient will benefit from skilled therapeutic intervention in order to improve the following deficits and impairments:  Pain, Decreased strength, Decreased mobility, Postural dysfunction, Impaired flexibility, Decreased range of motion  Visit Diagnosis: Radiculopathy, lumbar region  Chronic pain of right knee  Muscle weakness (generalized)     Problem List Patient Active Problem List   Diagnosis Date Noted  . Aortic atherosclerosis ([place]) 04/04/2018  . Right knee DJD 06/19/2017  . Abdominal cramping 03/06/2016  . Fatigue 12/06/2015  . Uncontrolled diabetes mellitus with eye complications ([place]) 27/07/2374  . Uncontrolled diabetes mellitus with neurologic complication ([place]) 28/31/5176  . Uveitis 01/26/2015  . Venous stasis dermatitis 08/12/2014  . 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 Seborrheic keratoses 03/16/2014  . Edema 02/10/2014  . Dysphagia 02/10/2014  . Preventive measure 08/21/2013  . H/O left breast biopsy 08/21/2013  . Microscopic colitis 08/21/2013  . Hyperlipidemia 08/11/2013  . History of TIA (transient ischemic attack) 08/11/2013  . Essential hypertension, benign 08/11/2013  . Osteopenia 08/11/2013  . Hypothyroidism 08/11/2013      Laureen Abrahams, PT, DPT 04/19/18 9:35 AM     [facility] [place], [place], [address] Phone: [phone]   Fax:  [phone]  Name:  Carmen Cooper MRN: [id number] Date of Birth: [birth year]

## 2018-04-23 ENCOUNTER — Ambulatory Visit (INDEPENDENT_AMBULATORY_CARE_PROVIDER_SITE_OTHER): Payer: Medicare Other | Admitting: Physical Therapy

## 2018-04-23 ENCOUNTER — Encounter: Payer: Self-pay | Admitting: Physical Therapy

## 2018-04-23 DIAGNOSIS — M25561 Pain in right knee: Secondary | ICD-10-CM

## 2018-04-23 DIAGNOSIS — M5416 Radiculopathy, lumbar region: Secondary | ICD-10-CM

## 2018-04-23 DIAGNOSIS — G8929 Other chronic pain: Secondary | ICD-10-CM | POA: Diagnosis not present

## 2018-04-23 DIAGNOSIS — M6281 Muscle weakness (generalized): Secondary | ICD-10-CM

## 2018-04-23 NOTE — Patient Instructions (Signed)
Access Code: [email or web address]  URL: [email or web address]  Date: 04/23/2018  Prepared by: Faustino Congress   Exercises  Prone on Elbows Stretch - 1 reps - 1 sets - 3 min hold - 2x daily - 7x weekly  Prone Press Up - 10 reps - 1 sets - 1-2 sec hold - 2x daily - 7x weekly  Supine Piriformis Stretch with Foot on Ground - 3 reps - 1 sets - 30 sec hold - 2x daily - 7x weekly  Supine Hamstring Stretch with Strap - 3 reps - 1 sets - 30 sec hold - 2x daily - 7x weekly  Patient Education  Sleep Hygiene

## 2018-04-23 NOTE — Therapy (Signed)
[place], [place], [address] Phone: [phone]   Fax:  [phone]  Physical Therapy Treatment  Patient Details  Name: Carmen Cooper MRN: [id number] Date of Birth: 13-Jan-1941 Referring Provider (PT): Gregor Hams, MD   Encounter Date: 04/23/2018  PT End of Session - 04/23/18 0935    Visit Number  4    Number of Visits  12    Date for PT Re-Evaluation  05/23/18    Authorization Type  Medicare, Tricare    PT Start Time  0801    PT Stop Time  1884    PT Time Calculation (min)  56 min    Activity Tolerance  Patient tolerated treatment well    Behavior During Therapy  [facility] for tasks assessed/performed       Past Medical History:  Diagnosis Date  . BCC (basal cell carcinoma of skin)   . Diabetes ([place])   . Glaucoma   . History of TIA (transient ischemic attack) 08/11/2013   12/2012 - [name]   . Hypertension   . Hypothyroidism 08/11/2013  . Microscopic colitis 08/21/2013   2008 [facility] [name].  Normal colonoscopy 2009 repeat as routine in 2019   . Thyroid disease   . 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, [facility]     Past Surgical History:  Procedure Laterality Date  . MOHS SURGERY  2019   Nose bcc     There were no vitals filed for this visit.  Subjective Assessment - 04/23/18 0804    Subjective  Rt knee is bothering her today.  pain improved with movement.      Patient Stated Goals  improve pain in Rt knee    Currently in Pain?  Yes    Pain Score  8     Pain Location  Knee    Pain Orientation  Right;Lateral    Pain Descriptors / Indicators  Sharp;Burning;Nagging    Pain Type  Chronic pain    Pain Radiating Towards  lateral ankle    Pain Onset  More than a month ago    Pain Frequency  Constant    Aggravating Factors   sitting > 1 hour, wakes her up at night    Pain Relieving Factors  repositioning, walking                        [facility] Adult PT Treatment/Exercise - 04/23/18 0808      Self-Care   Self-Care  Other Self-Care Comments    Other Self-Care Comments   sleep education, deep breathing and meditation, aerobic exercise daily      Lumbar Exercises: Stretches   Double Knee to Chest Stretch  5 reps;10 seconds   2 sets; with green physioball   Standing Extension  10 reps    Standing Extension Limitations  improvement in symptoms with repeated extension    Prone on Elbows Stretch  3 reps;60 seconds   continuous   Press Ups  10 reps   2 seconds   Press Ups Limitations  limited mobility      Lumbar Exercises: Aerobic   Nustep  L5 x 6 min   PT present to discuss progress     Traction   Type of Traction  Lumbar    Min (lbs)  50    Max (lbs)  45    Hold Time  60    Rest Time  20    Time  15      Manual Therapy   Manual therapy comments  Rt lateral shift correction 5x5 sec holds x 3 sets                  PT Long Term Goals - 04/11/18 1316      PT LONG TERM GOAL #1   Title  indepenedent with HEP    Status  New    Target Date  05/23/18      PT LONG TERM GOAL #2   Title  report centralization of symptoms for improved function and mobility    Status  New    Target Date  05/23/18      PT LONG TERM GOAL #3   Title  report no episodes of Rt knee buckling x 2 weeks for improved function    Status  New    Target Date  05/23/18      PT LONG TERM GOAL #4   Title  FOTO score improved to </= 35% limited for improved function    Status  New    Target Date  05/23/18            Plan - 04/23/18 0936    Clinical Impression Statement  Pt reports pain improves with repeated extension and with correction of lateral shift.  Pt with minimal compliance with home exercises at this time, and limited carryover between sessions.  Discussed ways to help with continued carryover including aerobic activity, deep breathing exercises and sleep.  Pt will continue to  benefit from PT to maximize function.    PT Frequency  2x / week    PT Duration  6 weeks    PT Treatment/Interventions  ADLs/Self Care Home Management;Cryotherapy;Electrical Stimulation;Functional mobility training;Stair training;Gait training;DME Instruction;Ultrasound;Traction;Moist Heat;Therapeutic activities;Therapeutic exercise;Neuromuscular re-education;Patient/family education;Manual techniques;Dry needling;Taping    PT Next Visit Plan  review HEP, continue extension based program if symptoms continue to improve, try traction, core/hip stability    PT Home Exercise Plan  Access Code: [email or web address]     Consulted and Agree with Plan of Care  Patient       Patient will benefit from skilled therapeutic intervention in order to improve the following deficits and impairments:  Pain, Decreased strength, Decreased mobility, Postural dysfunction, Impaired flexibility, Decreased range of motion  Visit Diagnosis: Radiculopathy, lumbar region  Chronic pain of right knee  Muscle weakness (generalized)     Problem List Patient Active Problem List   Diagnosis Date Noted  . Aortic atherosclerosis ([place]) 04/04/2018  . Right knee DJD 06/19/2017  . Abdominal cramping 03/06/2016  . Fatigue 12/06/2015  . Uncontrolled diabetes mellitus with eye complications ([place]) 21/30/8657  . Uncontrolled diabetes mellitus with neurologic complication ([place]) 84/69/6295  . Uveitis 01/26/2015  . Venous stasis dermatitis 08/12/2014  . 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 Seborrheic keratoses 03/16/2014  . Edema 02/10/2014  . Dysphagia 02/10/2014  . Preventive measure 08/21/2013  . H/O left breast biopsy 08/21/2013  . Microscopic colitis 08/21/2013  . Hyperlipidemia 08/11/2013  . History of TIA (transient ischemic attack) 08/11/2013  . Essential hypertension, benign 08/11/2013  . Osteopenia 08/11/2013  . Hypothyroidism 08/11/2013      Laureen Abrahams, PT, DPT 04/23/18 9:38 AM     [facility] [place], [place], [address] Phone: [phone]   Fax:  [phone]  Name: Carmen Cooper MRN: [id number] Date of Birth: April 03, 1940

## 2018-04-26 ENCOUNTER — Other Ambulatory Visit: Payer: Self-pay

## 2018-04-26 ENCOUNTER — Ambulatory Visit (INDEPENDENT_AMBULATORY_CARE_PROVIDER_SITE_OTHER): Payer: Medicare Other | Admitting: Physical Therapy

## 2018-04-26 ENCOUNTER — Encounter: Payer: Self-pay | Admitting: Physical Therapy

## 2018-04-26 DIAGNOSIS — M25561 Pain in right knee: Secondary | ICD-10-CM

## 2018-04-26 DIAGNOSIS — G8929 Other chronic pain: Secondary | ICD-10-CM

## 2018-04-26 DIAGNOSIS — M6281 Muscle weakness (generalized): Secondary | ICD-10-CM | POA: Diagnosis not present

## 2018-04-26 DIAGNOSIS — M5416 Radiculopathy, lumbar region: Secondary | ICD-10-CM

## 2018-04-26 NOTE — Therapy (Addendum)
[place], [place], [address] Phone: [phone]   Fax:  [phone]  Physical Therapy Treatment/Discharge  Patient Details  Name: Carmen Cooper MRN: [id number] Date of Birth: [birth year] Referring Provider (PT): Gregor Hams, MD   Encounter Date: 04/26/2018  PT End of Session - 04/26/18 0841    Visit Number  5    Number of Visits  12    Date for PT Re-Evaluation  05/23/18    Authorization Type  Medicare, Tricare    PT Start Time  0801    PT Stop Time  0841    PT Time Calculation (min)  40 min    Activity Tolerance  Patient tolerated treatment well    Behavior During Therapy  [facility] for tasks assessed/performed       Past Medical History:  Diagnosis Date  . BCC (basal cell carcinoma of skin)   . Diabetes ([place])   . Glaucoma   . History of TIA (transient ischemic attack) 08/11/2013   12/2012 - [name]   . Hypertension   . Hypothyroidism 08/11/2013  . Microscopic colitis 08/21/2013   2008 [facility] [name].  Normal colonoscopy 2009 repeat as routine in 2019   . Thyroid disease   . 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, [facility]     Past Surgical History:  Procedure Laterality Date  . MOHS SURGERY  2019   Nose bcc     There were no vitals filed for this visit.  Subjective Assessment - 04/26/18 0804    Subjective  pain is improved during session, but pt reports no changes overall.  states exercises don't help (despite helping in clinic).      Patient Stated Goals  improve pain in Rt knee    Currently in Pain?  Yes    Pain Score  6     Pain Location  Knee    Pain Orientation  Right;Lateral    Pain Descriptors / Indicators  Sharp;Burning;Nagging    Pain Type  Chronic pain    Pain Radiating Towards  centralized from ankle; down to knee    Pain Onset  More than a month ago    Pain Frequency  Constant    Aggravating Factors   sitting > 1 hour; wakes her up at  night    Pain Relieving Factors  repositioning, walking                       [facility] Adult PT Treatment/Exercise - 04/26/18 0806      Lumbar Exercises: Stretches   Standing Extension  10 reps    Standing Extension Limitations  improvement in symptoms with repeated extension    Prone on Elbows Stretch  3 reps;60 seconds   continuous   Prone on Elbows Stretch Limitations  resolve of knee pain within 1-2 min    Press Ups  10 reps   2 seconds   Press Ups Limitations  limited mobility      Lumbar Exercises: Aerobic   Nustep  L5 x 6 min   PT present to discuss progress     Lumbar Exercises: Standing   Row  Both;10 reps;Theraband    Theraband Level (Row)  Level 2 (Red)    Row Limitations  5 sec hold    Shoulder Extension  Both;10 reps;Theraband    Theraband Level (Shoulder Extension)  Level 2 (Red)    Shoulder Extension Limitations  5 sec hold      Lumbar Exercises: Supine   Pelvic Tilt  10 reps;5 seconds    Bridge  10 reps;5 seconds    Bridge Limitations  x10 with strap    Other Supine Lumbar Exercises  isometric hip abduction 10 x 5 sec      Lumbar Exercises: Prone   Straight Leg Raise  10 reps;2 seconds                  PT Long Term Goals - 04/11/18 1316      PT LONG TERM GOAL #1   Title  indepenedent with HEP    Status  New    Target Date  05/23/18      PT LONG TERM GOAL #2   Title  report centralization of symptoms for improved function and mobility    Status  New    Target Date  05/23/18      PT LONG TERM GOAL #3   Title  report no episodes of Rt knee buckling x 2 weeks for improved function    Status  New    Target Date  05/23/18      PT LONG TERM GOAL #4   Title  FOTO score improved to </= 35% limited for improved function    Status  New    Target Date  05/23/18            Plan - 04/26/18 0841    Clinical Impression Statement  Pt reports improvement in symptoms with prone extension and reinforced need to continue at home  to help with symptoms.  Recommend extension every hour and limiting flexion activities at this time.  Will continue to benefit from PT to maximize function.    PT Frequency  2x / week    PT Duration  6 weeks    PT Treatment/Interventions  ADLs/Self Care Home Management;Cryotherapy;Electrical Stimulation;Functional mobility training;Stair training;Gait training;DME Instruction;Ultrasound;Traction;Moist Heat;Therapeutic activities;Therapeutic exercise;Neuromuscular re-education;Patient/family education;Manual techniques;Dry needling;Taping    PT Next Visit Plan  continue extension based program if symptoms continue to improve, core/hip stability    PT Home Exercise Plan  Access Code: [email or web address]     Consulted and Agree with Plan of Care  Patient       Patient will benefit from skilled therapeutic intervention in order to improve the following deficits and impairments:  Pain, Decreased strength, Decreased mobility, Postural dysfunction, Impaired flexibility, Decreased range of motion  Visit Diagnosis: Radiculopathy, lumbar region  Chronic pain of right knee  Muscle weakness (generalized)     Problem List Patient Active Problem List   Diagnosis Date Noted  . Aortic atherosclerosis ([place]) 04/04/2018  . Right knee DJD 06/19/2017  . Abdominal cramping 03/06/2016  . Fatigue 12/06/2015  . Uncontrolled diabetes mellitus with eye complications ([place]) 63/84/6659  . Uncontrolled diabetes mellitus with neurologic complication ([place]) 93/57/0177  . Uveitis 01/26/2015  . Venous stasis dermatitis 08/12/2014  . 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 Seborrheic keratoses 03/16/2014  . Edema 02/10/2014  . Dysphagia 02/10/2014  . Preventive measure 08/21/2013  . H/O left breast biopsy 08/21/2013  . Microscopic colitis 08/21/2013  . Hyperlipidemia 08/11/2013  . History of TIA (transient ischemic attack) 08/11/2013  . Essential hypertension, benign 08/11/2013  . Osteopenia 08/11/2013  . Hypothyroidism  08/11/2013      Laureen Abrahams, PT, DPT 04/26/18 8:42 AM     [facility] [place], [place], [address] Phone: [phone]   Fax:  [phone]  Name: Carmen Cooper MRN: [id number] Date of Birth: 1940-11-29     PHYSICAL THERAPY DISCHARGE SUMMARY  Visits from Start of Care: 5  Current functional level related to goals / functional outcomes: See above   Remaining deficits: See above; spoke with pt over telephone and she reports she is doing well; requested d/c at this time.   Education / Equipment: HEP  Plan: Patient agrees to discharge.  Patient goals were not met. Patient is being discharged due to being pleased with the current functional level.  ?????    Laureen Abrahams, PT, DPT 05/29/18 2:31 PM  [facility] [place], [place] [address]  [phone] (office) [phone] (fax)

## 2018-04-30 ENCOUNTER — Encounter: Payer: Medicare Other | Admitting: Physical Therapy

## 2018-05-03 ENCOUNTER — Encounter: Payer: Medicare Other | Admitting: Physical Therapy

## 2018-05-06 ENCOUNTER — Telehealth: Payer: Self-pay | Admitting: Physical Therapy

## 2018-05-06 NOTE — Telephone Encounter (Signed)
Spoke to pt to follow up with physical therapy at this time due to clinic closing.  Pt reports husband now in rehab facility (hospitalized last week) and she has had decreased pain with exercises as well as walking and yard work.  Pt requested to follow up next week.  Advised to call office if she needs [place] sooner.  Laureen Abrahams, PT, DPT 05/06/18 12:27 PM

## 2018-06-04 ENCOUNTER — Encounter: Payer: Self-pay | Admitting: Family Medicine

## 2018-06-04 ENCOUNTER — Ambulatory Visit (INDEPENDENT_AMBULATORY_CARE_PROVIDER_SITE_OTHER): Payer: Medicare Other | Admitting: Family Medicine

## 2018-06-04 VITALS — BP 96/44 | HR 73 | Temp 98.4°F | Wt 135.0 lb

## 2018-06-04 DIAGNOSIS — I152 Hypertension secondary to endocrine disorders: Secondary | ICD-10-CM

## 2018-06-04 DIAGNOSIS — E1159 Type 2 diabetes mellitus with other circulatory complications: Secondary | ICD-10-CM

## 2018-06-04 DIAGNOSIS — E1165 Type 2 diabetes mellitus with hyperglycemia: Secondary | ICD-10-CM

## 2018-06-04 DIAGNOSIS — E1139 Type 2 diabetes mellitus with other diabetic ophthalmic complication: Secondary | ICD-10-CM

## 2018-06-04 DIAGNOSIS — I700 Atherosclerosis of aorta: Secondary | ICD-10-CM

## 2018-06-04 DIAGNOSIS — G4700 Insomnia, unspecified: Secondary | ICD-10-CM | POA: Diagnosis not present

## 2018-06-04 DIAGNOSIS — E113599 Type 2 diabetes mellitus with proliferative diabetic retinopathy without macular edema, unspecified eye: Secondary | ICD-10-CM | POA: Diagnosis not present

## 2018-06-04 DIAGNOSIS — Z794 Long term (current) use of insulin: Secondary | ICD-10-CM

## 2018-06-04 DIAGNOSIS — R220 Localized swelling, mass and lump, head: Secondary | ICD-10-CM | POA: Diagnosis not present

## 2018-06-04 DIAGNOSIS — E1149 Type 2 diabetes mellitus with other diabetic neurological complication: Secondary | ICD-10-CM | POA: Diagnosis not present

## 2018-06-04 DIAGNOSIS — IMO0002 Reserved for concepts with insufficient information to code with codable children: Secondary | ICD-10-CM

## 2018-06-04 DIAGNOSIS — I10 Essential (primary) hypertension: Secondary | ICD-10-CM

## 2018-06-04 LAB — POCT GLYCOSYLATED HEMOGLOBIN (HGB A1C): Hemoglobin A1C: 9 % — AB (ref 4.0–5.6)

## 2018-06-04 MED ORDER — AMLODIPINE BESYLATE 5 MG PO TABS: 5.0000 mg | ORAL_TABLET | Freq: Every day | ORAL | 1 refills | Status: DC

## 2018-06-04 NOTE — Patient Instructions (Addendum)
Thank you for coming in today. For blood pressure decrease the amlodipine dose to 5mg  daily from 10mg  daily.  New pills will be sent.   Keep an eye on the nose. If it is growing we can do a biopsy in the clinic.   Let your endocrinologist know that we checked A1c today so you dont get that lab done twice.  It was 9.0.   Keep me updated and recheck as needed.

## 2018-06-04 NOTE — Progress Notes (Signed)
Carmen Cooper is a 78 y.o. female who presents to [place]: [place] today for  follow-up lumbar radiculopathy.  Carmen Cooper was seen 2 months ago for right leg pain thought to be lumbar radiculopathy.  She notes this has improved quite a bit after physical therapy.  She notes that she is essentially pain-free and is happy with how things are going.  Additionally about a year ago she had Mohs surgery for basal cell carcinoma of the tip of the nose.  She notes he is having a bit more swelling in this area and is wearing that is recurring.  Additionally she has hypertension which is managed with 10 mg of amlodipine, 12.5 mg of hydrochlorothiazide.  She denies lightheadedness dizziness chest pain palpitations shortness of breath.  She has difficult to control diabetes.  This is managed with a insulin pump by endocrinology.  She notes that she is having episodes of hypoglycemia with blood sugars down into the 60s.  She will be awoken from sleep feeling jittery and sweaty at times.  She has her blood sugar also goes up to 500s at times.  After A1c was obtained she notes that she has a follow-up appointment scheduled with endocrinology later this week.  Additionally she notes that she is not sleeping well.  She has increased life stressors.  She is trying to move house and is dealing with a husband with dementia.  She notes that she has more stressors than usual.  She would like to avoid medications to help her sleep because she is worried that she will sleep through a hypoglycemic episode.    ROS as above:  Exam:  BP (!) 96/44   Pulse 73   Temp 98.4 F (36.9 C) (Oral)   Wt 135 lb (61.2 kg)   BMI 22.47 kg/m  Wt Readings from Last 5 Encounters:  06/04/18 135 lb (61.2 kg)  04/03/18 136 lb (61.7 kg)  02/12/18 137 lb (62.1 kg)  12/28/17 140 lb (63.5 kg)  09/27/17 140 lb (63.5 kg)    Gen:  Well NAD HEENT: EOMI,  MMM Lungs: Normal work of breathing. CTABL Heart: RRR no MRG Abd: NABS, Soft. Nondistended, Nontender Exts: Brisk capillary refill, warm and well perfused.  Skin: Small nodule at left side of tip of nose.  No pearly edges consistent with basal cell carcinoma.     Lab and Radiology Results Results for orders placed or performed in visit on 06/04/18 (from the past 72 hour(s))  POCT HgB A1C     Status: Abnormal   Collection Time: 06/04/18  9:01 AM  Result Value Ref Range   Hemoglobin A1C 9.0 (A) 4.0 - 5.6 %   HbA1c POC (<> result, manual entry)     HbA1c, POC (prediabetic range)     HbA1c, POC (controlled diabetic range)     No results found.    Assessment and Plan: 77 y.o. female with  Lumbar radiculopathy improved continue current regimen watchful waiting.  Skin nodule on nose: Unclear etiology could be return of basal cell however I think it is more of just swelling at scar.  Watchful waiting if worsening would proceed with shave biopsy in clinic.  Hypertension: Blood pressure bit low today.  Plan to reduce amlodipine dose and recheck in about 3 months.  Diabetes: A1c 9.0 which is a bit higher than goal but not bad considering her difficult to control diabetes.  Follow-up with endocrinology as scheduled that later  this week.  Insomnia and anxiety: Increased stressors.  Discussed options patient would like to avoid medications.  Watchful waiting recheck in the near future.  History of aortic atherosclerosis: Continue blood pressure management.  Consider recheck lipid panel in near future.  PDMP not reviewed this encounter. Orders Placed This Encounter  Procedures  . POCT HgB A1C   Meds ordered this encounter  Medications  . amLODipine (NORVASC) 5 MG tablet    Sig: Take 1 tablet (5 mg total) by mouth daily.    Dispense:  90 tablet    Refill:  1     Historical information moved to improve visibility of documentation.  Past Medical History:   Diagnosis Date  . BCC (basal cell carcinoma of skin)   . Diabetes ([place])   . Glaucoma   . History of TIA (transient ischemic attack) 08/11/2013   12/2012 - [name]   . Hypertension   . Hypothyroidism 08/11/2013  . Microscopic colitis 08/21/2013   [facility] [name].  Normal colonoscopy 2009 repeat as routine in 2019   . Thyroid disease   . 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, [facility]    Past Surgical History:  Procedure Laterality Date  . MOHS SURGERY  2019   Nose bcc    Social History   Tobacco Use  . Smoking status: Current Every Day Smoker    Packs/day: 0.25    Years: 20.00    Pack years: 5.00  . Smokeless tobacco: Never Used  Substance Use Topics  . Alcohol use: Yes    Alcohol/week: 2.0 standard drinks    Types: 2 Shots of liquor per week    Comment: 2-3 a day   family history includes Heart disease in her son.  Medications: Current Outpatient Medications  Medication Sig Dispense Refill  . AMBULATORY NON FORMULARY MEDICATION Freestyle light test strips Test twice a day  Dx type 2 diabetes E11.9 100 each 11  . amLODipine (NORVASC) 5 MG tablet Take 1 tablet (5 mg total) by mouth daily. 90 tablet 1  . aspirin EC 81 MG tablet Take by mouth.    Marland Kitchen atorvastatin (LIPITOR) 40 MG tablet Take 1 tablet (40 mg total) by mouth daily. Due for lab work 90 tablet 3  . diclofenac sodium (VOLTAREN) 1 % GEL Apply 4 g topically 4 (four) times daily. To affected joint. 500 g 11  . Dorzolamide HCl-Timolol Mal PF 22.3-6.8 MG/ML SOLN Apply to eye.    . folic acid (FOLVITE) 1 MG tablet Take by mouth.    Marland Kitchen FREESTYLE LITE test strip     . HUMIRA PEN 40 MG/0.4ML PNKT     . hydrochlorothiazide (HYDRODIURIL) 12.5 MG tablet Take 1 tablet (12.5 mg total) by mouth daily. 90 tablet 3  . insulin lispro (HUMALOG) 100 UNIT/ML injection Medtronic 630G pump.  Basal 12-6a 0.625, 6a-7p 0.725, 7p-12a 0.625.  Preset bolus:  4/5/6.  ISF 50.  Total daily dose:  40 units/day     . levothyroxine (SYNTHROID, LEVOTHROID) 112 MCG tablet Take 1 tablet (112 mcg total) by mouth daily. 90 tablet 3  . lisinopril (PRINIVIL,ZESTRIL) 20 MG tablet Take 1 tablet (20 mg total) by mouth daily. 90 tablet 3  . methotrexate (RHEUMATREX) 2.5 MG tablet Take by mouth.    . Multiple Vitamin (MULTI-VITAMINS) TABS Take by mouth.    . multivitamin-iron-minerals-folic acid (CENTRUM) chewable tablet Chew by mouth.    . nepafenac (NEVANAC) 0.1 % ophthalmic suspension Place 1 drop  into the right eye 3 (three) times a day.    . Omega-3 1000 MG CAPS Take by mouth.    . triamcinolone cream (KENALOG) 0.5 % Apply 1 application topically 2 (two) times daily. To affected areas. 30 g 3  . ZIOPTAN 0.0015 % SOLN      No current facility-administered medications for this visit.    Allergies  Allergen Reactions  . Dexamethasone Anaphylaxis and Other (See Comments)    Blood sugar elevated    . Brimonidine Tartrate Other (See Comments)    Burning and redness Burning and redness Burning and redness Burning and redness   . Clindamycin/Lincomycin Rash  . Sulfa Antibiotics Rash  . Valacyclovir Hcl Rash     Discussed warning signs or symptoms. Please see discharge instructions. Patient expresses understanding.

## 2018-06-05 ENCOUNTER — Ambulatory Visit: Payer: Medicare Other | Admitting: Family Medicine

## 2018-06-07 DIAGNOSIS — E039 Hypothyroidism, unspecified: Secondary | ICD-10-CM | POA: Diagnosis not present

## 2018-06-07 DIAGNOSIS — E1049 Type 1 diabetes mellitus with other diabetic neurological complication: Secondary | ICD-10-CM | POA: Diagnosis not present

## 2018-06-07 DIAGNOSIS — E1021 Type 1 diabetes mellitus with diabetic nephropathy: Secondary | ICD-10-CM | POA: Diagnosis not present

## 2018-06-07 DIAGNOSIS — E1065 Type 1 diabetes mellitus with hyperglycemia: Secondary | ICD-10-CM | POA: Diagnosis not present

## 2018-06-19 DIAGNOSIS — Z8673 Personal history of transient ischemic attack (TIA), and cerebral infarction without residual deficits: Secondary | ICD-10-CM | POA: Diagnosis not present

## 2018-06-19 DIAGNOSIS — F17200 Nicotine dependence, unspecified, uncomplicated: Secondary | ICD-10-CM | POA: Diagnosis not present

## 2018-06-19 DIAGNOSIS — E10649 Type 1 diabetes mellitus with hypoglycemia without coma: Secondary | ICD-10-CM | POA: Diagnosis not present

## 2018-06-19 DIAGNOSIS — E1040 Type 1 diabetes mellitus with diabetic neuropathy, unspecified: Secondary | ICD-10-CM | POA: Diagnosis not present

## 2018-06-19 DIAGNOSIS — E1022 Type 1 diabetes mellitus with diabetic chronic kidney disease: Secondary | ICD-10-CM | POA: Diagnosis not present

## 2018-06-19 DIAGNOSIS — N183 Chronic kidney disease, stage 3 (moderate): Secondary | ICD-10-CM | POA: Diagnosis not present

## 2018-06-19 DIAGNOSIS — E1065 Type 1 diabetes mellitus with hyperglycemia: Secondary | ICD-10-CM | POA: Diagnosis not present

## 2018-06-19 DIAGNOSIS — Z9641 Presence of insulin pump (external) (internal): Secondary | ICD-10-CM | POA: Diagnosis not present

## 2018-07-09 DIAGNOSIS — E10649 Type 1 diabetes mellitus with hypoglycemia without coma: Secondary | ICD-10-CM | POA: Diagnosis not present

## 2018-07-09 DIAGNOSIS — E785 Hyperlipidemia, unspecified: Secondary | ICD-10-CM | POA: Diagnosis not present

## 2018-07-09 DIAGNOSIS — Z9641 Presence of insulin pump (external) (internal): Secondary | ICD-10-CM | POA: Diagnosis not present

## 2018-07-09 DIAGNOSIS — I10 Essential (primary) hypertension: Secondary | ICD-10-CM | POA: Diagnosis not present

## 2018-07-09 DIAGNOSIS — E1022 Type 1 diabetes mellitus with diabetic chronic kidney disease: Secondary | ICD-10-CM | POA: Diagnosis not present

## 2018-07-09 DIAGNOSIS — N183 Chronic kidney disease, stage 3 (moderate): Secondary | ICD-10-CM | POA: Diagnosis not present

## 2018-07-09 DIAGNOSIS — E1069 Type 1 diabetes mellitus with other specified complication: Secondary | ICD-10-CM | POA: Diagnosis not present

## 2018-07-09 DIAGNOSIS — E1059 Type 1 diabetes mellitus with other circulatory complications: Secondary | ICD-10-CM | POA: Diagnosis not present

## 2018-07-09 DIAGNOSIS — E1065 Type 1 diabetes mellitus with hyperglycemia: Secondary | ICD-10-CM | POA: Diagnosis not present

## 2018-07-09 DIAGNOSIS — E1040 Type 1 diabetes mellitus with diabetic neuropathy, unspecified: Secondary | ICD-10-CM | POA: Diagnosis not present

## 2018-08-08 DIAGNOSIS — H4043X3 Glaucoma secondary to eye inflammation, bilateral, severe stage: Secondary | ICD-10-CM | POA: Diagnosis not present

## 2018-08-08 DIAGNOSIS — H209 Unspecified iridocyclitis: Secondary | ICD-10-CM | POA: Diagnosis not present

## 2018-08-23 ENCOUNTER — Other Ambulatory Visit: Payer: Self-pay | Admitting: Family Medicine

## 2018-09-03 ENCOUNTER — Ambulatory Visit: Payer: Medicare Other | Admitting: Family Medicine

## 2018-09-06 DIAGNOSIS — E039 Hypothyroidism, unspecified: Secondary | ICD-10-CM | POA: Diagnosis not present

## 2018-09-06 DIAGNOSIS — E1059 Type 1 diabetes mellitus with other circulatory complications: Secondary | ICD-10-CM | POA: Diagnosis not present

## 2018-09-06 DIAGNOSIS — I10 Essential (primary) hypertension: Secondary | ICD-10-CM | POA: Diagnosis not present

## 2018-09-06 DIAGNOSIS — E1049 Type 1 diabetes mellitus with other diabetic neurological complication: Secondary | ICD-10-CM | POA: Diagnosis not present

## 2018-09-06 DIAGNOSIS — E1065 Type 1 diabetes mellitus with hyperglycemia: Secondary | ICD-10-CM | POA: Diagnosis not present

## 2018-09-06 DIAGNOSIS — E1021 Type 1 diabetes mellitus with diabetic nephropathy: Secondary | ICD-10-CM | POA: Diagnosis not present

## 2018-09-06 LAB — HEMOGLOBIN A1C: Hemoglobin A1C: 9.1

## 2018-09-19 ENCOUNTER — Ambulatory Visit (INDEPENDENT_AMBULATORY_CARE_PROVIDER_SITE_OTHER): Payer: Medicare Other | Admitting: Family Medicine

## 2018-09-19 ENCOUNTER — Encounter: Payer: Self-pay | Admitting: Family Medicine

## 2018-09-19 ENCOUNTER — Other Ambulatory Visit: Payer: Self-pay

## 2018-09-19 VITALS — BP 100/61 | HR 71 | Temp 97.9°F | Wt 132.0 lb

## 2018-09-19 DIAGNOSIS — G8929 Other chronic pain: Secondary | ICD-10-CM | POA: Diagnosis not present

## 2018-09-19 DIAGNOSIS — E1159 Type 2 diabetes mellitus with other circulatory complications: Secondary | ICD-10-CM | POA: Diagnosis not present

## 2018-09-19 DIAGNOSIS — F5101 Primary insomnia: Secondary | ICD-10-CM

## 2018-09-19 DIAGNOSIS — M1731 Unilateral post-traumatic osteoarthritis, right knee: Secondary | ICD-10-CM | POA: Diagnosis not present

## 2018-09-19 DIAGNOSIS — E1149 Type 2 diabetes mellitus with other diabetic neurological complication: Secondary | ICD-10-CM | POA: Diagnosis not present

## 2018-09-19 DIAGNOSIS — E1139 Type 2 diabetes mellitus with other diabetic ophthalmic complication: Secondary | ICD-10-CM | POA: Diagnosis not present

## 2018-09-19 DIAGNOSIS — I700 Atherosclerosis of aorta: Secondary | ICD-10-CM | POA: Diagnosis not present

## 2018-09-19 DIAGNOSIS — E782 Mixed hyperlipidemia: Secondary | ICD-10-CM | POA: Diagnosis not present

## 2018-09-19 DIAGNOSIS — G4700 Insomnia, unspecified: Secondary | ICD-10-CM | POA: Insufficient documentation

## 2018-09-19 DIAGNOSIS — I152 Hypertension secondary to endocrine disorders: Secondary | ICD-10-CM

## 2018-09-19 DIAGNOSIS — E1165 Type 2 diabetes mellitus with hyperglycemia: Secondary | ICD-10-CM

## 2018-09-19 DIAGNOSIS — IMO0002 Reserved for concepts with insufficient information to code with codable children: Secondary | ICD-10-CM

## 2018-09-19 DIAGNOSIS — I10 Essential (primary) hypertension: Secondary | ICD-10-CM

## 2018-09-19 DIAGNOSIS — M25561 Pain in right knee: Secondary | ICD-10-CM

## 2018-09-19 MED ORDER — ATORVASTATIN CALCIUM 40 MG PO TABS: 40.0000 mg | ORAL_TABLET | Freq: Every day | ORAL | 3 refills | Status: DC

## 2018-09-19 MED ORDER — DICLOFENAC SODIUM 1 % TD GEL: 4.0000 g | Freq: Four times a day (QID) | TRANSDERMAL | 11 refills | Status: DC

## 2018-09-19 MED ORDER — LISINOPRIL 20 MG PO TABS: 20.0000 mg | ORAL_TABLET | Freq: Every day | ORAL | 3 refills | Status: DC

## 2018-09-19 MED ORDER — TRAZODONE HCL 50 MG PO TABS: 25.0000 mg | ORAL_TABLET | Freq: Every evening | ORAL | 3 refills | Status: DC | PRN

## 2018-09-19 NOTE — Progress Notes (Signed)
Carmen Cooper is a 78 y.o. female who presents to [place]: [place] today for follow-up diabetes hypertension knee pain hyperlipidemia.  Danilyn has brittle diabetes that is managed by endocrinology at [facility].  Her A1c recently was checked and was around 9.  She had she has episodes of hyper and hypoglycemic blood sugars.  She does feel her hypoglycemic episodes however and is able to respond.  For hypertension and hyperlipidemia she takes medications listed below and feels well.  No chest pain palpitation shortness of breath lightheadedness or dizziness.  She does have some trouble sleeping.  She has trouble falling asleep and staying asleep.  In the past she is had trazodone and had pretty good results with that without next-day fatigue.  She like a refill if possible.  She notes overall she has fewer episodes of stress.  Her husband which was a former source of stress in her life has not become more demented and less demanding.   She also has some right knee pain.  She notes pain and swelling.  She has been thought to have DJD in the past.  She has a prescription for diclofenac gel.  She notes that when she uses it it works pretty well but does not use it very consistently.  She denies locking or catching or giving way.    ROS as above:  Exam:  BP 100/61   Pulse 71   Temp 97.9 F (36.6 C) (Oral)   Wt 132 lb (59.9 kg)   BMI 21.97 kg/m  Wt Readings from Last 5 Encounters:  09/19/18 132 lb (59.9 kg)  06/04/18 135 lb (61.2 kg)  04/03/18 136 lb (61.7 kg)  02/12/18 137 lb (62.1 kg)  12/28/17 140 lb (63.5 kg)    Gen: Well NAD HEENT: EOMI,  MMM Lungs: Normal work of breathing. CTABL Heart: RRR no MRG Abd: NABS, Soft. Nondistended, Nontender Exts: Brisk capillary refill, warm and well perfused.  Right knee: Mild effusion normal range of motion.     Assessment and  Plan: 78 y.o. female with  Diabetes: Brittle not well controlled.  However patient is getting excellent care from endocrinology.  Plan to continue current regimen and be very careful and aware of hypoglycemic episodes and careful about carbohydrates.  Hypertension and hyperlipidemia: Doing well continue current regimen.  Labs recently checked at [facility].  Insomnia: Reasonable to use sparing trazodone.  Acacian sent to pharmacy.  Recheck as needed.  Knee pain: DJD.  Add Tylenol.  Continue diclofenac gel.  If no improvement would consider Visco supplementation/hyaluronic acid.  Avoid steroid injection given poorly controlled brittle diabetes.  Recheck 6 months.  Return sooner if needed.  PDMP not reviewed this encounter. Orders Placed This Encounter  Procedures  . Hemoglobin A1c    This external order was created through the Results Console.   Meds ordered this encounter  Medications  . atorvastatin (LIPITOR) 40 MG tablet    Sig: Take 1 tablet (40 mg total) by mouth daily.    Dispense:  90 tablet    Refill:  3  . lisinopril (ZESTRIL) 20 MG tablet    Sig: Take 1 tablet (20 mg total) by mouth daily.    Dispense:  90 tablet    Refill:  3  . traZODone (DESYREL) 50 MG tablet    Sig: Take 0.5-1 tablets (25-50 mg total) by mouth at bedtime as needed for sleep.    Dispense:  90 tablet  Refill:  3  . diclofenac sodium (VOLTAREN) 1 % GEL    Sig: Apply 4 g topically 4 (four) times daily. To affected joint.    Dispense:  500 g    Refill:  11    Patient failed oral ibuprofen or aleve.     Historical information moved to improve visibility of documentation.  Past Medical History:  Diagnosis Date  . BCC (basal cell carcinoma of skin)   . Diabetes ([place])   . Glaucoma   . History of TIA (transient ischemic attack) 08/11/2013   12/2012 - [name]   . Hypertension   . Hypothyroidism 08/11/2013  . Microscopic colitis 08/21/2013   2008 [facility] [name].  Normal  colonoscopy 2009 repeat as routine in 2019   . Thyroid disease   . 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, [facility]    Past Surgical History:  Procedure Laterality Date  . MOHS SURGERY  2019   Nose bcc    Social History   Tobacco Use  . Smoking status: Current Every Day Smoker    Packs/day: 0.25    Years: 20.00    Pack years: 5.00  . Smokeless tobacco: Never Used  Substance Use Topics  . Alcohol use: Yes    Alcohol/week: 2.0 standard drinks    Types: 2 Shots of liquor per week    Comment: 2-3 a day   family history includes Heart disease in her son.  Medications: Current Outpatient Medications  Medication Sig Dispense Refill  . AMBULATORY NON FORMULARY MEDICATION Freestyle light test strips Test twice a day  Dx type 2 diabetes E11.9 100 each 11  . amLODipine (NORVASC) 5 MG tablet Take 1 tablet (5 mg total) by mouth daily. 90 tablet 1  . aspirin EC 81 MG tablet Take by mouth.    Marland Kitchen atorvastatin (LIPITOR) 40 MG tablet Take 1 tablet (40 mg total) by mouth daily. 90 tablet 3  . diclofenac sodium (VOLTAREN) 1 % GEL Apply 4 g topically 4 (four) times daily. To affected joint. 500 g 11  . Dorzolamide HCl-Timolol Mal PF 22.3-6.8 MG/ML SOLN Apply to eye.    . folic acid (FOLVITE) 1 MG tablet Take by mouth.    Marland Kitchen FREESTYLE LITE test strip     . HUMIRA PEN 40 MG/0.4ML PNKT     . hydrochlorothiazide (HYDRODIURIL) 12.5 MG tablet TAKE 1 TABLET DAILY 90 tablet 3  . insulin lispro (HUMALOG) 100 UNIT/ML injection Medtronic 630G pump.  Basal 12-6a 0.625, 6a-7p 0.725, 7p-12a 0.625.  Preset bolus:  4/5/6.  ISF 50.  Total daily dose:  40 units/day    . levothyroxine (SYNTHROID, LEVOTHROID) 112 MCG tablet Take 1 tablet (112 mcg total) by mouth daily. 90 tablet 3  . lisinopril (ZESTRIL) 20 MG tablet Take 1 tablet (20 mg total) by mouth daily. 90 tablet 3  . methotrexate (RHEUMATREX) 2.5 MG tablet Take by mouth.    . Multiple Vitamin (MULTI-VITAMINS) TABS Take by mouth.    .  multivitamin-iron-minerals-folic acid (CENTRUM) chewable tablet Chew by mouth.    . nepafenac (NEVANAC) 0.1 % ophthalmic suspension Place 1 drop into the right eye 3 (three) times a day.    . Omega-3 1000 MG CAPS Take by mouth.    . triamcinolone cream (KENALOG) 0.5 % Apply 1 application topically 2 (two) times daily. To affected areas. 30 g 3  . ZIOPTAN 0.0015 % SOLN     . traZODone (DESYREL) 50 MG tablet Take 0.5-1  tablets (25-50 mg total) by mouth at bedtime as needed for sleep. 90 tablet 3   No current facility-administered medications for this visit.    Allergies  Allergen Reactions  . Dexamethasone Anaphylaxis and Other (See Comments)    Blood sugar elevated    . Brimonidine Tartrate Other (See Comments)    Burning and redness Burning and redness Burning and redness Burning and redness   . Clindamycin/Lincomycin Rash  . Sulfa Antibiotics Rash  . Valacyclovir Hcl Rash     Discussed warning signs or symptoms. Please see discharge instructions. Patient expresses understanding.

## 2018-09-19 NOTE — Patient Instructions (Addendum)
Thank you for coming in today. Use trazodone for insomnia.  Continue diabetes care with Dr Hartford Poli.  Recheck with me in 6 months or sooner if needed.   For knee pain take tylenol and use the diclofenac gel.  There are shots that we can start that do not have steroids so will not raise the blood sugar.

## 2018-09-24 ENCOUNTER — Ambulatory Visit: Payer: Medicare Other | Admitting: Family Medicine

## 2018-10-09 DIAGNOSIS — E1040 Type 1 diabetes mellitus with diabetic neuropathy, unspecified: Secondary | ICD-10-CM | POA: Diagnosis not present

## 2018-10-09 DIAGNOSIS — E1059 Type 1 diabetes mellitus with other circulatory complications: Secondary | ICD-10-CM | POA: Diagnosis not present

## 2018-10-09 DIAGNOSIS — Z9641 Presence of insulin pump (external) (internal): Secondary | ICD-10-CM | POA: Diagnosis not present

## 2018-10-09 DIAGNOSIS — E1022 Type 1 diabetes mellitus with diabetic chronic kidney disease: Secondary | ICD-10-CM | POA: Diagnosis not present

## 2018-10-09 DIAGNOSIS — E1065 Type 1 diabetes mellitus with hyperglycemia: Secondary | ICD-10-CM | POA: Diagnosis not present

## 2018-10-09 DIAGNOSIS — I10 Essential (primary) hypertension: Secondary | ICD-10-CM | POA: Diagnosis not present

## 2018-10-09 DIAGNOSIS — N183 Chronic kidney disease, stage 3 (moderate): Secondary | ICD-10-CM | POA: Diagnosis not present

## 2018-10-24 ENCOUNTER — Ambulatory Visit (INDEPENDENT_AMBULATORY_CARE_PROVIDER_SITE_OTHER): Payer: Medicare Other | Admitting: Family Medicine

## 2018-10-24 DIAGNOSIS — Z23 Encounter for immunization: Secondary | ICD-10-CM | POA: Diagnosis not present

## 2018-10-24 MED ORDER — ZOSTER VAC RECOMB ADJUVANTED 50 MCG/0.5ML IM SUSR: 0.5000 mL | Freq: Once | INTRAMUSCULAR | 0 refills | Status: AC

## 2018-10-24 NOTE — Progress Notes (Signed)
Pt here for flu shot. Afebrile,no recent illness. Vaccination given, pt tolerated well..Terrian Sentell Lynetta, CMA  

## 2018-11-04 DIAGNOSIS — H401120 Primary open-angle glaucoma, left eye, stage unspecified: Secondary | ICD-10-CM | POA: Diagnosis not present

## 2018-11-04 DIAGNOSIS — Z79899 Other long term (current) drug therapy: Secondary | ICD-10-CM | POA: Diagnosis not present

## 2018-11-04 DIAGNOSIS — H3581 Retinal edema: Secondary | ICD-10-CM | POA: Diagnosis not present

## 2018-11-04 DIAGNOSIS — H30031 Focal chorioretinal inflammation, peripheral, right eye: Secondary | ICD-10-CM | POA: Diagnosis not present

## 2018-11-04 DIAGNOSIS — H44111 Panuveitis, right eye: Secondary | ICD-10-CM | POA: Diagnosis not present

## 2018-11-04 DIAGNOSIS — H4041X1 Glaucoma secondary to eye inflammation, right eye, mild stage: Secondary | ICD-10-CM | POA: Diagnosis not present

## 2018-11-04 DIAGNOSIS — Z961 Presence of intraocular lens: Secondary | ICD-10-CM | POA: Diagnosis not present

## 2018-11-04 DIAGNOSIS — H209 Unspecified iridocyclitis: Secondary | ICD-10-CM | POA: Diagnosis not present

## 2018-11-04 LAB — HM DIABETES EYE EXAM

## 2018-11-28 DIAGNOSIS — R600 Localized edema: Secondary | ICD-10-CM | POA: Diagnosis not present

## 2018-11-28 DIAGNOSIS — N183 Chronic kidney disease, stage 3 unspecified: Secondary | ICD-10-CM | POA: Diagnosis not present

## 2018-11-28 DIAGNOSIS — E1040 Type 1 diabetes mellitus with diabetic neuropathy, unspecified: Secondary | ICD-10-CM | POA: Diagnosis not present

## 2018-11-28 DIAGNOSIS — E1059 Type 1 diabetes mellitus with other circulatory complications: Secondary | ICD-10-CM | POA: Diagnosis not present

## 2018-11-28 DIAGNOSIS — Z9641 Presence of insulin pump (external) (internal): Secondary | ICD-10-CM | POA: Diagnosis not present

## 2018-11-28 DIAGNOSIS — E1022 Type 1 diabetes mellitus with diabetic chronic kidney disease: Secondary | ICD-10-CM | POA: Diagnosis not present

## 2018-11-28 DIAGNOSIS — I10 Essential (primary) hypertension: Secondary | ICD-10-CM | POA: Diagnosis not present

## 2018-11-28 DIAGNOSIS — E1065 Type 1 diabetes mellitus with hyperglycemia: Secondary | ICD-10-CM | POA: Diagnosis not present

## 2018-11-28 LAB — HEMOGLOBIN A1C: Hemoglobin A1C: 8.8

## 2018-11-29 LAB — HEPATIC FUNCTION PANEL
ALT: 35 (ref 7–35)
AST: 34 (ref 13–35)
Alkaline Phosphatase: 143 — AB (ref 25–125)
Bilirubin, Total: 0.9

## 2018-11-29 LAB — BASIC METABOLIC PANEL
BUN: 24 — AB (ref 4–21)
Creatinine: 1.1 (ref 0.5–1.1)
Glucose: 274
Potassium: 3.9 (ref 3.4–5.3)
Sodium: 139 (ref 137–147)

## 2018-12-02 ENCOUNTER — Other Ambulatory Visit: Payer: Self-pay

## 2018-12-02 ENCOUNTER — Ambulatory Visit (INDEPENDENT_AMBULATORY_CARE_PROVIDER_SITE_OTHER): Payer: Medicare Other | Admitting: Family Medicine

## 2018-12-02 ENCOUNTER — Encounter: Payer: Self-pay | Admitting: Family Medicine

## 2018-12-02 VITALS — BP 129/81 | HR 74 | Temp 98.0°F | Ht 65.0 in | Wt 135.0 lb

## 2018-12-02 DIAGNOSIS — R600 Localized edema: Secondary | ICD-10-CM | POA: Diagnosis not present

## 2018-12-02 DIAGNOSIS — N1831 Chronic kidney disease, stage 3a: Secondary | ICD-10-CM

## 2018-12-02 NOTE — Progress Notes (Signed)
Carmen Cooper is a 78 y.o. female who presents to [place]: [place] today for follow-up CKD 3. Carmen Cooper has a long history of not ideally controlled diabetes.  She recently started an intensive diabetes program.  Metabolic panel was obtained which showed creatinine had increased slightly to 1.13 with a GFR of 45.  This is slightly worsened from her baseline of a creatinine of about 1.0.  Her son is very concerned and is here today with her to discuss her kidney failure.  Additionally Carmen Cooper notes some lower extremity swelling.  She has bilateral lower extremity swelling.  This is been ongoing for months it feels a little bit tight.  She does not use any kind of compression stockings for her legs.   ROS as above:  Exam:  BP 129/81   Pulse 74   Temp 98 F (36.7 C)   Ht '5\' 5"'  (1.651 m)   Wt 135 lb (61.2 kg)   SpO2 99%   BMI 22.47 kg/m  Wt Readings from Last 5 Encounters:  12/02/18 135 lb (61.2 kg)  09/19/18 132 lb (59.9 kg)  06/04/18 135 lb (61.2 kg)  04/03/18 136 lb (61.7 kg)  02/12/18 137 lb (62.1 kg)    Gen: Well NAD HEENT: EOMI,  MMM Lungs: Normal work of breathing. CTABL Heart: RRR no MRG Abd: NABS, Soft. Nondistended, Nontender Exts: Brisk capillary refill, warm and well perfused.  Trace bilateral lower extremity edema  Lab and Radiology Results Care Everywhere Result Report Comprehensive metabolic panelResulted: 45/36/4680 5:36 AM [facility] Component Name Value Ref Range  Glucose 274 (H) 65 - 99 mg/dL  BUN 24 8 - 27 mg/dL  Creatinine, Serum 1.14 (H) 0.57 - 1 mg/dL  eGFR If NonAfrican American 46 (L) >59 mL/min/1.73  eGFR If African American 53 (L) >59 mL/min/1.73  BUN/Creatinine Ratio 21 12 - 28   Sodium 139 134 - 144 mmol/L  Potassium 3.9 3.5 - 5.2 mmol/L  Chloride 102 96 - 106 mmol/L  CO2 23 20 - 29 mmol/L  CALCIUM 9.2 8.7 - 10.3 mg/dL  Total  Protein 7.0 6 - 8.5 g/dL  Albumin, Serum 4.2 3.7 - 4.7 g/dL  Globulin, Total 2.8 1.5 - 4.5 g/dL  Albumin/Globulin Ratio 1.5 1.2 - 2.2   Total Bilirubin 0.9 0 - 1.2 mg/dL  Alkaline Phosphatase 143 (H) 39 - 117 IU/L  AST 34 0 - 40 IU/L  ALT (SGPT) 35 (H) 0 - 32 IU/L  Specimen Collected on  Blood 11/28/2018 4:55 PM  Result Narrative  Performed at: 01 - [facility] [address], [place], [place] [id number] Lab Director: Rush Farmer MD, Phone: [phone]   A1c: 8.8 11/28/18    Assessment and Plan: 78 y.o. female with  CKD 3: Likely progression of underlying kidney disease secondary to not ideally controlled diabetes.  However patient will benefit from repeat lab check in 4 to 6 weeks.  She apparently has follow-up scheduled with her high intensity diabetes program in about 6 weeks which would be acceptable for lab follow-up.  I will letter to the provider that the high intensity program.  If they would like me to check her renal function sooner we well.  Discussed that the best way to prevent further progression of her kidney disease is to control blood pressure and diabetes.  Leg swelling: Likely secondary to amlodipine but possibly secondary to her renal dysfunction.  Recommend compression stockings.  If that is not effective for  helpful then next step would be discontinuing amlodipine and likely increasing hydrochlorothiazide dose in response.  Backup plan if still not helpful would be Lasix however would like to avoid Lasix if possible as that may worsen her renal dysfunction.  Check back in 3 months or so with new PCP.   CC: [address], [address]  [address]  [place], [place] [address]  [phone]  [phone] (Fax) PDMP not reviewed this encounter. No orders of the defined types were placed in this encounter.  No orders of the defined types were placed in this encounter.    Historical information moved to improve visibility  of documentation.  Past Medical History:  Diagnosis Date  . BCC (basal cell carcinoma of skin)   . Diabetes ([place])   . Glaucoma   . History of TIA (transient ischemic attack) 08/11/2013   12/2012 - [name]   . Hypertension   . Hypothyroidism 08/11/2013  . Microscopic colitis 08/21/2013   2008 [facility] [name].  Normal colonoscopy 2009 repeat as routine in 2019   . Thyroid disease   . 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, [facility]    Past Surgical History:  Procedure Laterality Date  . MOHS SURGERY  2019   Nose bcc    Social History   Tobacco Use  . Smoking status: Current Every Day Smoker    Packs/day: 0.25    Years: 20.00    Pack years: 5.00  . Smokeless tobacco: Never Used  Substance Use Topics  . Alcohol use: Yes    Alcohol/week: 2.0 standard drinks    Types: 2 Shots of liquor per week    Comment: 2-3 a day   family history includes Heart disease in her son.  Medications: Current Outpatient Medications  Medication Sig Dispense Refill  . AMBULATORY NON FORMULARY MEDICATION Freestyle light test strips Test twice a day  Dx type 2 diabetes E11.9 100 each 11  . amLODipine (NORVASC) 5 MG tablet Take 1 tablet (5 mg total) by mouth daily. 90 tablet 1  . aspirin EC 81 MG tablet Take by mouth.    Marland Kitchen atorvastatin (LIPITOR) 40 MG tablet Take 1 tablet (40 mg total) by mouth daily. 90 tablet 3  . diclofenac sodium (VOLTAREN) 1 % GEL Apply 4 g topically 4 (four) times daily. To affected joint. 500 g 11  . Dorzolamide HCl-Timolol Mal PF 22.3-6.8 MG/ML SOLN Apply to eye.    . folic acid (FOLVITE) 1 MG tablet Take by mouth.    Marland Kitchen FREESTYLE LITE test strip     . HUMIRA PEN 40 MG/0.4ML PNKT     . hydrochlorothiazide (HYDRODIURIL) 12.5 MG tablet TAKE 1 TABLET DAILY 90 tablet 3  . insulin lispro (HUMALOG) 100 UNIT/ML injection Medtronic 630G pump.  Basal 12-6a 0.625, 6a-7p 0.725, 7p-12a 0.625.  Preset bolus:  4/5/6.  ISF 50.  Total daily dose:  40 units/day     . levothyroxine (SYNTHROID, LEVOTHROID) 112 MCG tablet Take 1 tablet (112 mcg total) by mouth daily. 90 tablet 3  . lisinopril (ZESTRIL) 20 MG tablet Take 1 tablet (20 mg total) by mouth daily. 90 tablet 3  . methotrexate (RHEUMATREX) 2.5 MG tablet Take by mouth.    . Multiple Vitamin (MULTI-VITAMINS) TABS Take by mouth.    . multivitamin-iron-minerals-folic acid (CENTRUM) chewable tablet Chew by mouth.    . nepafenac (NEVANAC) 0.1 % ophthalmic suspension Place 1 drop into the right eye 3 (three)  times a day.    . Omega-3 1000 MG CAPS Take by mouth.    . traZODone (DESYREL) 50 MG tablet Take 0.5-1 tablets (25-50 mg total) by mouth at bedtime as needed for sleep. 90 tablet 3  . triamcinolone cream (KENALOG) 0.5 % Apply 1 application topically 2 (two) times daily. To affected areas. 30 g 3  . ZIOPTAN 0.0015 % SOLN      No current facility-administered medications for this visit.    Allergies  Allergen Reactions  . Dexamethasone Anaphylaxis and Other (See Comments)    Blood sugar elevated    . Brimonidine Tartrate Other (See Comments)    Burning and redness Burning and redness Burning and redness Burning and redness   . Clindamycin/Lincomycin Rash  . Sulfa Antibiotics Rash  . Valacyclovir Hcl Rash     Discussed warning signs or symptoms. Please see discharge instructions. Patient expresses understanding.

## 2018-12-03 ENCOUNTER — Encounter: Payer: Self-pay | Admitting: Family Medicine

## 2018-12-03 DIAGNOSIS — N1831 Chronic kidney disease, stage 3a: Secondary | ICD-10-CM | POA: Insufficient documentation

## 2018-12-05 ENCOUNTER — Encounter: Payer: Self-pay | Admitting: Family Medicine

## 2018-12-05 ENCOUNTER — Telehealth: Payer: Self-pay | Admitting: Family Medicine

## 2018-12-05 DIAGNOSIS — I152 Hypertension secondary to endocrine disorders: Secondary | ICD-10-CM

## 2018-12-05 DIAGNOSIS — E1159 Type 2 diabetes mellitus with other circulatory complications: Secondary | ICD-10-CM

## 2018-12-05 DIAGNOSIS — N1831 Chronic kidney disease, stage 3a: Secondary | ICD-10-CM

## 2018-12-05 NOTE — Telephone Encounter (Signed)
Pt advised. She will get labs completed.

## 2018-12-05 NOTE — Telephone Encounter (Signed)
I received a response from Ms. Rushton the diabetes nurse practitioner you saw the other day.  She agrees that we should recheck your labs.  Plan to get labs checked in about a month in mid November.  I will recheck kidney function.  Lab does not need to be fasting.  You do not need an appointment.  You can simply show up at my lab downstairs in my building to get the blood drawn.  Labs already ordered.

## 2018-12-09 DIAGNOSIS — E1021 Type 1 diabetes mellitus with diabetic nephropathy: Secondary | ICD-10-CM | POA: Diagnosis not present

## 2018-12-09 DIAGNOSIS — E1049 Type 1 diabetes mellitus with other diabetic neurological complication: Secondary | ICD-10-CM | POA: Diagnosis not present

## 2018-12-09 DIAGNOSIS — E1065 Type 1 diabetes mellitus with hyperglycemia: Secondary | ICD-10-CM | POA: Diagnosis not present

## 2018-12-09 DIAGNOSIS — I10 Essential (primary) hypertension: Secondary | ICD-10-CM | POA: Diagnosis not present

## 2018-12-09 DIAGNOSIS — E1069 Type 1 diabetes mellitus with other specified complication: Secondary | ICD-10-CM | POA: Diagnosis not present

## 2018-12-09 DIAGNOSIS — E785 Hyperlipidemia, unspecified: Secondary | ICD-10-CM | POA: Diagnosis not present

## 2018-12-09 DIAGNOSIS — E1059 Type 1 diabetes mellitus with other circulatory complications: Secondary | ICD-10-CM | POA: Diagnosis not present

## 2018-12-09 DIAGNOSIS — E039 Hypothyroidism, unspecified: Secondary | ICD-10-CM | POA: Diagnosis not present

## 2018-12-16 ENCOUNTER — Encounter: Payer: Self-pay | Admitting: Family Medicine

## 2019-01-02 ENCOUNTER — Encounter: Payer: Self-pay | Admitting: Family Medicine

## 2019-01-02 ENCOUNTER — Ambulatory Visit (INDEPENDENT_AMBULATORY_CARE_PROVIDER_SITE_OTHER): Payer: Medicare Other | Admitting: Family Medicine

## 2019-01-02 DIAGNOSIS — R05 Cough: Secondary | ICD-10-CM

## 2019-01-02 DIAGNOSIS — R0789 Other chest pain: Secondary | ICD-10-CM

## 2019-01-02 DIAGNOSIS — R110 Nausea: Secondary | ICD-10-CM | POA: Diagnosis not present

## 2019-01-02 DIAGNOSIS — R059 Cough, unspecified: Secondary | ICD-10-CM

## 2019-01-02 MED ORDER — FAMOTIDINE 20 MG PO TABS: 20.0000 mg | ORAL_TABLET | Freq: Two times a day (BID) | ORAL | 1 refills | Status: DC

## 2019-01-02 NOTE — Progress Notes (Signed)
Virtual Visit via Video Note  I connected with Carmen Cooper on 01/02/19 at  3:40 PM EST by a video enabled telemedicine application and verified that I am speaking with the correct person using two identifiers.   I discussed the limitations of evaluation and management by telemedicine and the availability of in person appointments. The patient expressed understanding and agreed to proceed.  Subjective:    CC: BAck pain, hurts with deep breath  HPI: 78 year old female smoker with a history of hypertension, aortic atherosclerosis, uncontrolled diabetes, and CKD three complains of midsternal lower chest pain that is been going on for about 2 to 3 weeks.  She says it is just sort of a low-grade discomfort but when she coughs or takes a deep breath the pain increases.  She has had a little bit of a dry cough but feels like the cough is really coming from the back of her throat and not her lower chest which is where she is having pain.  She has no prior history of coronary artery disease.  She denies any known triggers.  She denies any shortness of breath or palpitations.  She is also been feeling nauseated.  She says that she will feel hungry and then eat and then start to feel nauseated.  She is not actually vomited she is not had any abdominal pain or epigastric discomfort.  No diarrhea or blood in the stools.  She does not currently take any type of reflux medication.  She also reports that she has been having some right sided pain towards her back between the hip and the ribs.  That is been present for a couple of weeks as well.  Again no known injury or trauma.  Pain seems to radiate around.  Her son was present for the virtual visit.  Past medical history, Surgical history, Family history not pertinant except as noted below, Social history, Allergies, and medications have been entered into the medical record, reviewed, and corrections made.   Review of Systems: No fevers, chills, night sweats,  weight loss, chest pain, or shortness of breath.   Objective:    General: Speaking clearly in complete sentences without any shortness of breath.  Alert and oriented x3.  Normal judgment. No apparent acute distress.    Impression and Recommendations:    Atypical chest Pain -unclear etiology.  We discussed that it could be cardiac as well as esophageal as well as pulmonary.  With the concomitant nausea it could be GERD related or cardiac related.  We will go ahead and start her on an H2 blocker.  She also has uncontrolled diabetes so there could be some element of gastroparesis that is also causing the discomfort.  I would really like for her to come in and do an EKG.  Since her pain is not acute its been going on for several weeks I did encourage her to go to the ED if any point it increases or worsens.  I would also like to get a chest x-ray for further work-up as well as labs.  Cough -cough is mostly upper airway closer to her neck is not lower in the chest but since it has been present for a few weeks and its not resolving could be reflux related so again we will start an H2 blocker but will also get a chest x-ray.  Right sided pain -unsure if this was related to the chest pain and or cough.  Will check liver enzymes.  But most of her  pain is a little lower so it may also just be more musculoskeletal.  Nausea -consider gastroparesis since she does have uncontrolled diabetes etc.  Again we will check labs and evaluate for cardiac causes.  Will start H2 blocker.      I discussed the assessment and treatment plan with the patient. The patient was provided an opportunity to ask questions and all were answered. The patient agreed with the plan and demonstrated an understanding of the instructions.   The patient was advised to call back or seek an in-person evaluation if the symptoms worsen or if the condition fails to improve as anticipated.   Beatrice Lecher, MD

## 2019-01-03 ENCOUNTER — Ambulatory Visit: Payer: Medicare Other | Admitting: Family Medicine

## 2019-01-03 ENCOUNTER — Other Ambulatory Visit: Payer: Self-pay

## 2019-01-03 ENCOUNTER — Ambulatory Visit (INDEPENDENT_AMBULATORY_CARE_PROVIDER_SITE_OTHER): Payer: Medicare Other

## 2019-01-03 ENCOUNTER — Ambulatory Visit (INDEPENDENT_AMBULATORY_CARE_PROVIDER_SITE_OTHER): Payer: Medicare Other | Admitting: Family Medicine

## 2019-01-03 VITALS — BP 96/56 | HR 124

## 2019-01-03 DIAGNOSIS — I471 Supraventricular tachycardia, unspecified: Secondary | ICD-10-CM | POA: Insufficient documentation

## 2019-01-03 DIAGNOSIS — R079 Chest pain, unspecified: Secondary | ICD-10-CM | POA: Diagnosis not present

## 2019-01-03 DIAGNOSIS — R059 Cough, unspecified: Secondary | ICD-10-CM

## 2019-01-03 DIAGNOSIS — R0789 Other chest pain: Secondary | ICD-10-CM | POA: Diagnosis not present

## 2019-01-03 DIAGNOSIS — R05 Cough: Secondary | ICD-10-CM

## 2019-01-03 DIAGNOSIS — R110 Nausea: Secondary | ICD-10-CM

## 2019-01-03 DIAGNOSIS — I4891 Unspecified atrial fibrillation: Secondary | ICD-10-CM | POA: Insufficient documentation

## 2019-01-03 MED ORDER — METOPROLOL SUCCINATE ER 25 MG PO TB24: 25.0000 mg | ORAL_TABLET | Freq: Every day | ORAL | 2 refills | Status: DC

## 2019-01-03 MED ORDER — APIXABAN 5 MG PO TABS: 5.0000 mg | ORAL_TABLET | Freq: Two times a day (BID) | ORAL | 0 refills | Status: DC

## 2019-01-03 NOTE — Patient Instructions (Addendum)
Discontinue amlodipine and take metoprolol in its place.  Okay to stop hydrochlorothiazide if top number of her blood pressure is less than 110.  The metoprolol will help control your heart rate.  We are also starting a blood thinner called apixaban.  Please make sure to take regularly and not skip any doses. Because you also take an aspirin for stroke prevention definitely make sure that you are taking the prescription Pepcid to help protect your stomach.  We will be working on getting you in with cardiology for further work-up for your atrial fibrillation.  You will also need an echocardiogram so we will go ahead and get that ordered and see if we can get that scheduled as well.

## 2019-01-03 NOTE — Progress Notes (Signed)
Pt here to have an EKG done for atypical chest pain. Her daughter in law accompanied her today for her visit and stated that while she was waling up the stairs she became winded. Pt also reports that she continues to have the R sided rib pain that she informed Dr. Madilyn Fireman about yesterday.  Pt asked that if there is anything that Dr. Madilyn Fireman will need to discuss with her to please contact her son Darryl @ [phone] ok to lvm .Elouise Munroe, [employer]

## 2019-01-03 NOTE — Progress Notes (Signed)
Acute Office Visit  Subjective:    Patient ID: Carmen Cooper, female    DOB: [birth year], 78 y.o.   MRN: [id number]  Chief Complaint  Patient presents with  . Atypical chest pain    pt's daughter reports that she was a little winded while she was walking up the stairs this morning.    HPI Patient is in today for follow-up of yesterday's concerns.  We did a virtual visit yesterday in the late afternoon.  I was concerned because of some of her complaints about chest pain, nausea and right-sided pain below her ribs.  That we had recommended that she do some blood work come for chest x-ray and also do an EKG.  Yesterday she had denied any significant shortness of breath or palpitations.  Pt here to have an EKG done for atypical chest pain. Her daughter in law accompanied her today for her visit and stated that while she was waling up the stairs she became winded. Pt also reports that she continues to have the R sided rib pain.  Please see note from yesterday.  Symptoms started about 2 to 3 weeks ago with the chest pain and dry cough.  Past Medical History:  Diagnosis Date  . BCC (basal cell carcinoma of skin)   . Diabetes ([place])   . Glaucoma   . History of TIA (transient ischemic attack) 08/11/2013   12/2012 - [name]   . Hypertension   . Hypothyroidism 08/11/2013  . Microscopic colitis 08/21/2013   2008 [facility] [name].  Normal colonoscopy 2009 repeat as routine in 2019   . Thyroid disease   . 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, [facility]     Past Surgical History:  Procedure Laterality Date  . MOHS SURGERY  2019   Nose bcc     Family History  Problem Relation Age of Onset  . Heart disease Son   . Diabetes Neg Hx     Social History   Socioeconomic History  . Marital status: Married    Spouse name: Rachel Bo  . Number of children: 1  . Years of education: 30  . Highest education level: 12th grade  Occupational History  . 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 Financial resource strain: Not hard at all  . Food insecurity    Worry: Never true    Inability: Never true  . Transportation needs    Medical: No    Non-medical: No  Tobacco Use  . Smoking status: Current Every Day Smoker    Packs/day: 0.25    Years: 20.00    Pack years: 5.00  . Smokeless tobacco: Never Used  Substance and Sexual Activity  . Alcohol use: Yes    Alcohol/week: 2.0 standard drinks    Types: 2 Shots of liquor per week    Comment: 2-3 a day  . Drug use: No  . Sexual activity: Not Currently  Lifestyle  . Physical activity    Days per week: 0 days    Minutes per session: 0 min  . Stress: Not at all  Relationships  . Social Herbalist on phone: More than three times a week    Gets together: Never    Attends religious service: Never    Active member of club or organization: No    Attends meetings of clubs or organizations: Never    Relationship status: Married  . Intimate partner  violence    Fear of current or ex partner: No    Emotionally abused: No    Physically abused: No    Forced sexual activity: No  Other Topics Concern  . Not on file  Social History Narrative   Patient takes care of her husband who has dementia. Doesn't get out much. Drinks 2-3 cups of hot tea daily    Outpatient Medications Prior to Visit  Medication Sig Dispense Refill  . AMBULATORY NON FORMULARY MEDICATION Freestyle light test strips Test twice a day  Dx type 2 diabetes E11.9 100 each 11  . amLODipine (NORVASC) 5 MG tablet Take 1 tablet (5 mg total) by mouth daily. 90 tablet 1  . aspirin EC 81 MG tablet Take by mouth.    Marland Kitchen atorvastatin (LIPITOR) 40 MG tablet Take 1 tablet (40 mg total) by mouth daily. 90 tablet 3  . diclofenac sodium (VOLTAREN) 1 % GEL Apply 4 g topically 4 (four) times daily. To affected joint. 500 g 11  . Dorzolamide HCl-Timolol Mal PF 22.3-6.8 MG/ML SOLN Apply to eye.    . famotidine (PEPCID) 20 MG  tablet Take 1 tablet (20 mg total) by mouth 2 (two) times daily. 60 tablet 1  . folic acid (FOLVITE) 1 MG tablet Take by mouth.    Marland Kitchen FREESTYLE LITE test strip     . HUMIRA PEN 40 MG/0.4ML PNKT     . hydrochlorothiazide (HYDRODIURIL) 12.5 MG tablet TAKE 1 TABLET DAILY 90 tablet 3  . insulin lispro (HUMALOG) 100 UNIT/ML injection Medtronic 630G pump.  Basal 12-6a 0.625, 6a-7p 0.725, 7p-12a 0.625.  Preset bolus:  4/5/6.  ISF 50.  Total daily dose:  40 units/day    . levothyroxine (SYNTHROID, LEVOTHROID) 112 MCG tablet Take 1 tablet (112 mcg total) by mouth daily. 90 tablet 3  . lisinopril (ZESTRIL) 20 MG tablet Take 1 tablet (20 mg total) by mouth daily. 90 tablet 3  . methotrexate (RHEUMATREX) 2.5 MG tablet Take by mouth.    . Multiple Vitamin (MULTI-VITAMINS) TABS Take by mouth.    . multivitamin-iron-minerals-folic acid (CENTRUM) chewable tablet Chew by mouth.    . nepafenac (NEVANAC) 0.1 % ophthalmic suspension Place 1 drop into the right eye 3 (three) times a day.    . Omega-3 1000 MG CAPS Take by mouth.    Marland Kitchen ZIOPTAN 0.0015 % SOLN      No facility-administered medications prior to visit.     Allergies  Allergen Reactions  . Dexamethasone Anaphylaxis and Other (See Comments)    Blood sugar elevated    . Brimonidine Tartrate Other (See Comments)    Burning and redness Burning and redness Burning and redness Burning and redness   . Clindamycin/Lincomycin Rash  . Sulfa Antibiotics Rash  . Valacyclovir Hcl Rash    ROS     Objective:    Physical Exam  BP (!) 96/56   Pulse (!) 124   SpO2 100%  Wt Readings from Last 3 Encounters:  12/02/18 135 lb (61.2 kg)  09/19/18 132 lb (59.9 kg)  06/04/18 135 lb (61.2 kg)    There are no preventive care reminders to display for this patient.  There are no preventive care reminders to display for this patient.   Lab Results  Component Value Date   TSH 3.57 03/01/2018   Lab Results  Component Value Date   WBC 3.3 (L)  04/03/2018   HGB 13.5 04/03/2018   HCT 40.2 04/03/2018   MCV 97.8 04/03/2018  PLT 283 04/03/2018   Lab Results  Component Value Date   NA 139 11/29/2018   K 3.9 11/29/2018   CO2 32 04/03/2018   GLUCOSE 391 (H) 04/03/2018   BUN 24 (A) 11/29/2018   CREATININE 1.1 11/29/2018   BILITOT 1.3 (H) 04/03/2018   ALKPHOS 143 (A) 11/29/2018   AST 34 11/29/2018   ALT 35 11/29/2018   PROT 6.3 04/03/2018   ALBUMIN 3.8 12/06/2015   CALCIUM 9.3 04/03/2018   Lab Results  Component Value Date   CHOL 96 03/27/2017   Lab Results  Component Value Date   HDL 57 03/27/2017   Lab Results  Component Value Date   LDLCALC 28 03/27/2017   Lab Results  Component Value Date   TRIG 75 03/27/2017   Lab Results  Component Value Date   CHOLHDL 1.8 08/11/2013   Lab Results  Component Value Date   HGBA1C 8.8 11/28/2018       Assessment & Plan:   Problem List Items Addressed This Visit      Cardiovascular and Mediastinum   Atrial fibrillation (HCC)    CHA2DS2-VASc score is 7 today with age, atherosclerosis, and prior history of TIA.  Recommend anticoagulation.  Recommend better rate control.  We will switch amlodipine to metoprolol for now.  Blood pressure is already a little borderline low.  We will get her in with cardiology.  She started not feeling well about 2 to 3 weeks ago some can assume that is probably around the time that she went into A. Fib.  We will start Pepcid for GI prophylaxis as well.  She should have actually pick that prescription up yesterday.  Spoke with her son Coralyn Pear about care plan and left instructions upfront as well.      Relevant Medications   metoprolol succinate (TOPROL-XL) 25 MG 24 hr tablet   apixaban (ELIQUIS) 5 MG TABS tablet   Other Relevant Orders   Ambulatory referral to Cardiology   ECHOCARDIOGRAM COMPLETE    Other Visit Diagnoses    Atypical chest pain    -  Primary   Relevant Medications   metoprolol succinate (TOPROL-XL) 25 MG 24 hr tablet    apixaban (ELIQUIS) 5 MG TABS tablet   Other Relevant Orders   EKG 12-Lead   Ambulatory referral to Cardiology   ECHOCARDIOGRAM COMPLETE       Meds ordered this encounter  Medications  . metoprolol succinate (TOPROL-XL) 25 MG 24 hr tablet    Sig: Take 1 tablet (25 mg total) by mouth daily.    Dispense:  30 tablet    Refill:  2  . apixaban (ELIQUIS) 5 MG TABS tablet    Sig: Take 1 tablet (5 mg total) by mouth 2 (two) times daily.    Dispense:  60 tablet    Refill:  0     Beatrice Lecher, MD

## 2019-01-03 NOTE — Assessment & Plan Note (Addendum)
CHA2DS2-VASc score is 7 today with age, atherosclerosis, and prior history of TIA.  Recommend anticoagulation.  Recommend better rate control.  We will switch amlodipine to metoprolol for now.  Blood pressure is already a little borderline low.  We will get her in with cardiology.  She started not feeling well about 2 to 3 weeks ago some can assume that is probably around the time that she went into A. Fib.  We will start Pepcid for GI prophylaxis as well.  She should have actually pick that prescription up yesterday.  Spoke with her son Coralyn Pear about care plan and left instructions upfront as well.

## 2019-01-04 LAB — COMPLETE METABOLIC PANEL WITH GFR
AG Ratio: 1.3 (calc) (ref 1.0–2.5)
ALT: 26 U/L (ref 6–29)
AST: 30 U/L (ref 10–35)
Albumin: 3.8 g/dL (ref 3.6–5.1)
Alkaline phosphatase (APISO): 139 U/L (ref 37–153)
BUN/Creatinine Ratio: 21 (calc) (ref 6–22)
BUN: 21 mg/dL (ref 7–25)
CO2: 27 mmol/L (ref 20–32)
Calcium: 9.2 mg/dL (ref 8.6–10.4)
Chloride: 102 mmol/L (ref 98–110)
Creat: 1.02 mg/dL — ABNORMAL HIGH (ref 0.60–0.93)
GFR, Est African American: 61 mL/min/{1.73_m2} (ref >=60)
GFR, Est Non African American: 53 mL/min/{1.73_m2} — ABNORMAL LOW (ref >=60)
Globulin: 3 g/dL (calc) (ref 1.9–3.7)
Glucose, Bld: 212 mg/dL — ABNORMAL HIGH (ref 65–99)
Potassium: 4.4 mmol/L (ref 3.5–5.3)
Sodium: 140 mmol/L (ref 135–146)
Total Bilirubin: 1 mg/dL (ref 0.2–1.2)
Total Protein: 6.8 g/dL (ref 6.1–8.1)

## 2019-01-04 LAB — CBC WITH DIFFERENTIAL/PLATELET
Absolute Monocytes: 422 cells/uL (ref 200–950)
Basophils Absolute: 20 cells/uL (ref 0–200)
Basophils Relative: 0.6 %
Eosinophils Absolute: 201 cells/uL (ref 15–500)
Eosinophils Relative: 5.9 %
HCT: 36.4 % (ref 35.0–45.0)
Hemoglobin: 12.1 g/dL (ref 11.7–15.5)
Lymphs Abs: 806 cells/uL — ABNORMAL LOW (ref 850–3900)
MCH: 32.2 pg (ref 27.0–33.0)
MCHC: 33.2 g/dL (ref 32.0–36.0)
MCV: 96.8 fL (ref 80.0–100.0)
MPV: 10.8 fL (ref 7.5–12.5)
Monocytes Relative: 12.4 %
Neutro Abs: 1952 cells/uL (ref 1500–7800)
Neutrophils Relative %: 57.4 %
Platelets: 334 10*3/uL (ref 140–400)
RBC: 3.76 10*6/uL — ABNORMAL LOW (ref 3.80–5.10)
RDW: 18.9 % — ABNORMAL HIGH (ref 11.0–15.0)
Total Lymphocyte: 23.7 %
WBC: 3.4 10*3/uL — ABNORMAL LOW (ref 3.8–10.8)

## 2019-01-04 LAB — TSH: TSH: 3.87 mIU/L (ref 0.40–4.50)

## 2019-01-04 LAB — TROPONIN I: Troponin I: <0.01 ng/mL (ref <=0.0)

## 2019-01-04 LAB — BRAIN NATRIURETIC PEPTIDE: Brain Natriuretic Peptide: 367 pg/mL — ABNORMAL HIGH (ref <100)

## 2019-01-08 ENCOUNTER — Other Ambulatory Visit: Payer: Self-pay

## 2019-01-08 ENCOUNTER — Ambulatory Visit (HOSPITAL_BASED_OUTPATIENT_CLINIC_OR_DEPARTMENT_OTHER)
Admission: RE | Admit: 2019-01-08 | Discharge: 2019-01-08 | Disposition: A | Discharge status: Home / Self Care | Payer: Medicare Other | Source: Ambulatory Visit | Attending: Family Medicine | Admitting: Family Medicine

## 2019-01-08 DIAGNOSIS — I4891 Unspecified atrial fibrillation: Secondary | ICD-10-CM

## 2019-01-08 DIAGNOSIS — R0789 Other chest pain: Secondary | ICD-10-CM | POA: Insufficient documentation

## 2019-01-08 NOTE — Progress Notes (Signed)
  Echocardiogram 2D Echocardiogram has been performed.  Cardell Peach 01/08/2019, 3:51 PM

## 2019-01-13 ENCOUNTER — Telehealth: Payer: Self-pay | Admitting: Cardiology

## 2019-01-13 DIAGNOSIS — H209 Unspecified iridocyclitis: Secondary | ICD-10-CM | POA: Diagnosis not present

## 2019-01-13 DIAGNOSIS — H4043X3 Glaucoma secondary to eye inflammation, bilateral, severe stage: Secondary | ICD-10-CM | POA: Diagnosis not present

## 2019-01-13 NOTE — Telephone Encounter (Signed)
Informed that I did not have a sooner appt to offer pt.  Explained that if something opens up for this week I would call them and offer a sooner appt.  Advised to have pt contact PCP to discuss continued concern/issues until we can establish her in our practice. Son verbalized understanding and agreeable to plan.

## 2019-01-13 NOTE — Telephone Encounter (Signed)
New Message   Darryl is calling and is wanting to see if the pt can be seen sooner then Dec 8th. He is concerned and says the pt has been Experiencing more tiredness some dizziness    Please call

## 2019-01-15 DIAGNOSIS — Z131 Encounter for screening for diabetes mellitus: Secondary | ICD-10-CM | POA: Diagnosis not present

## 2019-01-15 DIAGNOSIS — Z9641 Presence of insulin pump (external) (internal): Secondary | ICD-10-CM | POA: Diagnosis not present

## 2019-01-15 DIAGNOSIS — I10 Essential (primary) hypertension: Secondary | ICD-10-CM | POA: Diagnosis not present

## 2019-01-15 DIAGNOSIS — E1065 Type 1 diabetes mellitus with hyperglycemia: Secondary | ICD-10-CM | POA: Diagnosis not present

## 2019-01-15 DIAGNOSIS — E1049 Type 1 diabetes mellitus with other diabetic neurological complication: Secondary | ICD-10-CM | POA: Diagnosis not present

## 2019-01-15 DIAGNOSIS — E1059 Type 1 diabetes mellitus with other circulatory complications: Secondary | ICD-10-CM | POA: Diagnosis not present

## 2019-01-15 DIAGNOSIS — E1040 Type 1 diabetes mellitus with diabetic neuropathy, unspecified: Secondary | ICD-10-CM | POA: Diagnosis not present

## 2019-01-16 ENCOUNTER — Telehealth: Payer: Self-pay | Admitting: Cardiology

## 2019-01-16 NOTE — Telephone Encounter (Signed)
Patient's son would like to come with his mother to her appt on 12/8.

## 2019-01-16 NOTE — Telephone Encounter (Signed)
Informed son ok to accompany pt to appt. She can get confused and has memory issues.

## 2019-01-21 ENCOUNTER — Encounter: Payer: Self-pay | Admitting: *Deleted

## 2019-01-21 ENCOUNTER — Ambulatory Visit (INDEPENDENT_AMBULATORY_CARE_PROVIDER_SITE_OTHER): Payer: Medicare Other | Admitting: Cardiology

## 2019-01-21 ENCOUNTER — Other Ambulatory Visit: Payer: Self-pay

## 2019-01-21 ENCOUNTER — Encounter: Payer: Self-pay | Admitting: Cardiology

## 2019-01-21 VITALS — BP 130/76 | HR 94 | Ht 65.0 in | Wt 128.0 lb

## 2019-01-21 DIAGNOSIS — I480 Paroxysmal atrial fibrillation: Secondary | ICD-10-CM

## 2019-01-21 DIAGNOSIS — Z01812 Encounter for preprocedural laboratory examination: Secondary | ICD-10-CM | POA: Diagnosis not present

## 2019-01-21 DIAGNOSIS — R079 Chest pain, unspecified: Secondary | ICD-10-CM | POA: Diagnosis not present

## 2019-01-21 MED ORDER — METOPROLOL SUCCINATE ER 50 MG PO TB24: 50.0000 mg | ORAL_TABLET | Freq: Every day | ORAL | 1 refills | Status: DC

## 2019-01-21 MED ORDER — AMIODARONE HCL 200 MG PO TABS: 200.0000 mg | ORAL_TABLET | Freq: Every day | ORAL | 2 refills | Status: DC

## 2019-01-21 MED ORDER — AMIODARONE HCL 200 MG PO TABS: ORAL_TABLET | ORAL | 0 refills | Status: DC

## 2019-01-21 NOTE — Patient Instructions (Signed)
Medication Instructions:  Your physician has recommended you make the following change in your medication:  1. INCREASE Toprol to 50 mg once daily 2. START Amiodarone on 01/29/19  - take 2 tablets (400 mg total) TWICE a day for 2 weeks, then  - take 1 tablets (200 mg total) TWICE a day for 2 weeks, then  - take 1 tablet (200 mg total) ONCE a day  * If you need a refill on your cardiac medications before your next appointment, please call your pharmacy.   Labwork: None ordered If you have labs (blood work) drawn today and your tests are completely normal, you will receive your results only by:  [facility] (if you have MyChart) OR  A paper copy in the mail If you have any lab test that is abnormal or we need to change your treatment, we will call you to review the results.  Testing/Procedures: Your physician has recommended that you have a Cardioversion (DCCV). Electrical Cardioversion uses a jolt of electricity to your heart either through paddles or wired patches attached to your chest. This is a controlled, usually prescheduled, procedure. Defibrillation is done under light anesthesia in the hospital, and you usually go home the day of the procedure. This is done to get your heart back into a normal rhythm. You are not awake for the procedure. Please see the instructions below  Follow-Up: At [facility], you and your health needs are our priority.  As part of our continuing mission to provide you with exceptional heart care, we have created designated Provider Care Teams.  These Care Teams include your primary Cardiologist (physician) and Advanced Practice Providers (APPs -  Physician Assistants and Nurse Practitioners) who all work together to provide you with the care you need, when you need it.  You will need a follow up appointment in 3 months.  Please call our office 2 months in advance to schedule this appointment.  You will see Dr Curt Bears.  Thank you for choosing [facility]!!   Trinidad Curet, RN [phone]  Any Other Special Instructions Will Be Listed Below (If Applicable).   Your provider has recommended a cardioversion.      COVID TEST-- On ____________ @ ___________ Dennis Bast will go to [facility] ([place]) for your Covid testing.   This is a drive thru test site.  There will be multiple testing areas.  Be sure to share with the first checkpoint that you are there for pre-procedure/surgery testing. This will put you into the right (yellow) lane that leads to the PAT testing team. Stay in your car and the nurse team will come to your car to test you.  After you are tested please go home and self quarantine until the day of your procedure.    You are scheduled for a cardioversion on _________ with Dr. ________ or associates. Please go to [facility] through the [employer] [facility] at __________. Do not have any food or drink after midnight the night before this procedure. You may take your medicines with a sip of water on the day of your procedure.  You will need someone to drive you home following your procedure.   Call the [facility] office at [phone] if you have any questions, problems or concerns.      Electrical Cardioversion Electrical cardioversion is the delivery of a jolt of electricity to change the rhythm of the heart. Sticky patches or metal  paddles are placed on the chest to deliver the electricity from a device. This is done to restore a normal rhythm. A rhythm that is too fast or not regular keeps the heart from pumping well. Electrical cardioversion is done in an emergency if:   There is low or no blood pressure as a result of the heart rhythm.    Normal rhythm must be restored as fast as possible to protect the brain and heart from further damage.    It may save a life. Cardioversion may be done for heart rhythms that are not immediately life  threatening, such as atrial fibrillation or flutter, in which:   The heart is beating too fast or is not regular.    Medicine to change the rhythm has not worked.    It is safe to wait in order to allow time for preparation.  Symptoms of the abnormal rhythm are bothersome.  The risk of stroke and other serious problems can be reduced.  LET [facility] CARE PROVIDER KNOW ABOUT:   Any allergies you have.  All medicines you are taking, including vitamins, herbs, eye drops, creams, and over-the-counter medicines.  Previous problems you or members of your family have had with the use of anesthetics.    Any blood disorders you have.    Previous surgeries you have had.    Medical conditions you have.   RISKS AND COMPLICATIONS  Generally, this is a safe procedure. However, problems can occur and include:   Breathing problems related to the anesthetic used.  A blood clot that breaks free and travels to other parts of your body. This could cause a stroke or other problems. The risk of this is lowered by use of blood-thinning medicine (anticoagulant) prior to the procedure.  Cardiac arrest (rare).   BEFORE THE PROCEDURE   You may have tests to detect blood clots in your heart and to evaluate heart function.   You may start taking anticoagulants so your blood does not clot as easily.    Medicines may be given to help stabilize your heart rate and rhythm.   PROCEDURE  You will be given medicine through an IV tube to reduce discomfort and make you sleepy (sedative).    An electrical shock will be delivered.   AFTER THE PROCEDURE Your heart rhythm will be watched to make sure it does not change. You will need someone to drive you home.

## 2019-01-21 NOTE — Addendum Note (Signed)
Addended by: Stanton Kidney on: 01/21/2019 11:48 AM   Modules accepted: Orders

## 2019-01-21 NOTE — Progress Notes (Signed)
Electrophysiology Office Note   Date:  01/21/2019   ID:  Carmen Cooper, DOB 1940-10-07, MRN [id number]  PCP:  Emeterio Reeve, DO  Cardiologist:   Primary Electrophysiologist:  Lillard Bailon Meredith Leeds, MD    Chief Complaint: SOB   History of Present Illness: Carmen Cooper is a 78 y.o. female who is being seen today for the evaluation of AF at the request of Hali Marry, *. Presenting today for electrophysiology evaluation.  She has a history of hypertension, aortic atherosclerosis, diabetes, CKD.  She has been having chest pain and shortness of breath.  She was found to be in atrial fibrillation by her primary physician.  She had an echo that showed an ejection fraction of 40 to 45%.  Today, she denies symptoms of palpitations, orthopnea, PND, lower extremity edema, claudication, dizziness, presyncope, syncope, bleeding, or neurologic sequela. The patient is tolerating medications without difficulties.  Her main symptoms are weakness, fatigue, and shortness of breath.  Her symptoms of been going on for approximately a month, though I feel that they are probably longer than that.  She was put on Eliquis 2 weeks ago.  She has been also having chest pain that occurs in the center of her chest and is worse with exertion and better at rest.  That being said, she is not exerting herself much due to her level of fatigue.   Past Medical History:  Diagnosis Date  . BCC (basal cell carcinoma of skin)   . Diabetes ([place])   . Glaucoma   . History of TIA (transient ischemic attack) 08/11/2013   12/2012 - [name]   . Hypertension   . Hypothyroidism 08/11/2013  . Microscopic colitis 08/21/2013   2008 [facility] [name].  Normal colonoscopy 2009 repeat as routine in 2019   . Thyroid disease   . 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, [facility]    Past Surgical History:  Procedure Laterality Date  . MOHS SURGERY  2019   Nose bcc      Current Outpatient  Medications  Medication Sig Dispense Refill  . AMBULATORY NON FORMULARY MEDICATION Freestyle light test strips Test twice a day  Dx type 2 diabetes E11.9 100 each 11  . amLODipine (NORVASC) 5 MG tablet Take 1 tablet (5 mg total) by mouth daily. 90 tablet 1  . apixaban (ELIQUIS) 5 MG TABS tablet Take 1 tablet (5 mg total) by mouth 2 (two) times daily. 60 tablet 0  . aspirin EC 81 MG tablet Take by mouth.    Marland Kitchen atorvastatin (LIPITOR) 40 MG tablet Take 1 tablet (40 mg total) by mouth daily. 90 tablet 3  . diclofenac sodium (VOLTAREN) 1 % GEL Apply 4 g topically 4 (four) times daily. To affected joint. 500 g 11  . Dorzolamide HCl-Timolol Mal PF 22.3-6.8 MG/ML SOLN Apply to eye.    . famotidine (PEPCID) 20 MG tablet Take 1 tablet (20 mg total) by mouth 2 (two) times daily. 60 tablet 1  . folic acid (FOLVITE) 1 MG tablet Take by mouth.    Marland Kitchen FREESTYLE LITE test strip     . HUMIRA PEN 40 MG/0.4ML PNKT     . hydrochlorothiazide (HYDRODIURIL) 12.5 MG tablet TAKE 1 TABLET DAILY 90 tablet 3  . insulin lispro (HUMALOG) 100 UNIT/ML injection Medtronic 630G pump.  Basal 12-6a 0.625, 6a-7p 0.725, 7p-12a 0.625.  Preset bolus:  4/5/6.  ISF 50.  Total daily dose:  40 units/day    . levothyroxine (SYNTHROID,  LEVOTHROID) 112 MCG tablet Take 1 tablet (112 mcg total) by mouth daily. 90 tablet 3  . lisinopril (ZESTRIL) 20 MG tablet Take 1 tablet (20 mg total) by mouth daily. 90 tablet 3  . methotrexate (RHEUMATREX) 2.5 MG tablet Take by mouth.    . Multiple Vitamin (MULTI-VITAMINS) TABS Take by mouth.    . multivitamin-iron-minerals-folic acid (CENTRUM) chewable tablet Chew by mouth.    . nepafenac (NEVANAC) 0.1 % ophthalmic suspension Place 1 drop into the right eye 3 (three) times a day.    . Omega-3 1000 MG CAPS Take by mouth.    Marland Kitchen ZIOPTAN 0.0015 % SOLN     . [START ON 01/29/2019] amiodarone (PACERONE) 200 MG tablet Take 2 tablets (400 mg total) TWICE a day for 2 weeks, then take 1 tablet (200 mg total) TWICE a  day for 2 weeks, then take 1 tablet ONCE daily 84 tablet 0  . amiodarone (PACERONE) 200 MG tablet Take 1 tablet (200 mg total) by mouth daily. 90 tablet 2  . metoprolol succinate (TOPROL-XL) 50 MG 24 hr tablet Take 1 tablet (50 mg total) by mouth daily. Take with or immediately following a meal. 90 tablet 1   No current facility-administered medications for this visit.     Allergies:   Dexamethasone, Brimonidine tartrate, Clindamycin/lincomycin, Sulfa antibiotics, and Valacyclovir hcl   Social History:  The patient  reports that she has been smoking. She has a 5.00 pack-year smoking history. She has never used smokeless tobacco. She reports current alcohol use of about 2.0 standard drinks of alcohol per week. She reports that she does not use drugs.   Family History:  The patient's family history includes Heart disease in her son.    ROS:  Please see the history of present illness.   Otherwise, review of systems is positive for none.   All other systems are reviewed and negative.    PHYSICAL EXAM: VS:  BP 130/76   Pulse 94   Ht 5\' 5"  (1.651 m)   Wt 128 lb (58.1 kg)   SpO2 90%   BMI 21.30 kg/m  , BMI Body mass index is 21.3 kg/m. GEN: Well nourished, well developed, in no acute distress  HEENT: normal  Neck: no JVD, carotid bruits, or masses Cardiac: Irregular; no murmurs, rubs, or gallops,no edema  Respiratory:  clear to auscultation bilaterally, normal work of breathing GI: soft, nontender, nondistended, + BS MS: no deformity or atrophy  Skin: warm and dry Neuro:  Strength and sensation are intact Psych: euthymic mood, full affect  EKG:  EKG is not ordered today. Personal review of the ekg ordered 01/03/19 shows atrial fibrillation, rate 114, poor R wave progression  Recent Labs: 01/03/2019: ALT 26; Brain Natriuretic Peptide 367; BUN 21; Creat 1.02; Hemoglobin 12.1; Platelets 334; Potassium 4.4; Sodium 140; TSH 3.87    Lipid Panel     Component Value Date/Time   CHOL  96 03/27/2017   TRIG 75 03/27/2017   HDL 57 03/27/2017   CHOLHDL 1.8 08/11/2013 0934   VLDL 12 08/11/2013 0934   LDLCALC 28 03/27/2017   LDLDIRECT 38 04/03/2018 0853     Wt Readings from Last 3 Encounters:  01/21/19 128 lb (58.1 kg)  12/02/18 135 lb (61.2 kg)  09/19/18 132 lb (59.9 kg)      Other studies Reviewed: Additional studies/ records that were reviewed today include: TTE 01/08/19  Review of the above records today demonstrates:   1. Left ventricular ejection fraction, by visual estimation,  is 40 to 45%. The left ventricle has moderately decreased function. There is global hypokinesis of the left ventricle. Left ventricular septal wall thickness was mildly increased. Mildly  increased left ventricular posterior wall thickness. There is mildly increased left ventricular hypertrophy.  2. Left ventricular diastolic parameters are indeterminate.  3. Global right ventricular systolic function is normal visually.The right ventricular size is normal. No increase in right ventricular wall thickness.  4. Left atrial size was severely dilated.  5. Right atrial size was severely dilated.  6. The mitral valve is normal in structure. Mild to moderate mitral valve regurgitation. No evidence of mitral stenosis.  7. The tricuspid valve is normal in structure. Tricuspid valve regurgitation moderate.  8. The aortic valve is normal in structure. Aortic valve regurgitation is not visualized. No evidence of aortic valve sclerosis or stenosis.  9. The pulmonic valve was not well visualized. Pulmonic valve regurgitation is not visualized. 10. Trivial circumferential pericardial effusion is present. 11. Mildly elevated pulmonary artery systolic pressure.   ASSESSMENT AND PLAN:  1.  Atrial fibrillation: Currently on metoprolol and Eliquis.  CHA2DS2-VASc of 7.  I do feel that her atrial fibrillation is certainly an issue and is causing her symptoms.  She would benefit from sinus rhythm.  Due to  that, we Matha Masse plan to load her on amiodarone and plan for a cardioversion.  2.  Chest pain: Pain is somewhat atypical, though with her low ejection fraction and pain with exertion, Kharlie Bring plan for Myoview.  3.  Systolic heart failure: Ejection fraction 40 to 45%.  Plan for Myoview to further evaluate the cause.  This could also be due to a tachycardia mediated cardiomyopathy.  She is currently on lisinopril.  Kadasia Kassing increase Toprol-XL to 50 mg.    Current medicines are reviewed at length with the patient today.   The patient does not have concerns regarding her medicines.  The following changes were made today: Increase Toprol-XL  Labs/ tests ordered today include:  Orders Placed This Encounter  Procedures  . Basic metabolic panel  . CBC   Case discussed with referring  Disposition:   FU with Audry Kauzlarich 3 months  Signed, Jaelin Devincentis Meredith Leeds, MD  01/21/2019 10:56 AM     [facility] [address] [place] [address] [phone] (office) [phone] (fax)

## 2019-01-24 ENCOUNTER — Telehealth (HOSPITAL_COMMUNITY): Payer: Self-pay | Admitting: *Deleted

## 2019-01-24 NOTE — Telephone Encounter (Signed)
Patient's son given detailed instructions per Myocardial Perfusion Study Information Sheet for the test on 01/27/19 at 10:45. Patient notified to arrive 15 minutes early and that it is imperative to arrive on time for appointment to keep from having the test rescheduled.  If you need to cancel or reschedule your appointment, please call the office within 24 hours of your appointment. . Patient's son verbalized understanding.Veronia Beets

## 2019-01-27 ENCOUNTER — Other Ambulatory Visit: Payer: Self-pay

## 2019-01-27 ENCOUNTER — Ambulatory Visit (HOSPITAL_COMMUNITY): Payer: Medicare Other | Attending: Cardiology

## 2019-01-27 DIAGNOSIS — R079 Chest pain, unspecified: Secondary | ICD-10-CM | POA: Insufficient documentation

## 2019-01-27 LAB — MYOCARDIAL PERFUSION IMAGING
LV dias vol: 46 mL (ref 46–106)
LV sys vol: 26 mL
Peak HR: 122 {beats}/min
Rest HR: 114 {beats}/min
SDS: 2
SRS: 1
SSS: 3
TID: 1

## 2019-01-27 MED ORDER — TECHNETIUM TC 99M TETROFOSMIN IV KIT
9.8000 | PACK | Freq: Once | INTRAVENOUS | Status: AC | PRN
  Administered 2019-01-27: 9.8 via INTRAVENOUS
  Filled 2019-01-27: qty 10

## 2019-01-27 MED ORDER — TECHNETIUM TC 99M TETROFOSMIN IV KIT
32.9000 | PACK | Freq: Once | INTRAVENOUS | Status: AC | PRN
  Administered 2019-01-27: 32.9 via INTRAVENOUS
  Filled 2019-01-27: qty 33

## 2019-01-27 MED ORDER — REGADENOSON 0.4 MG/5ML IV SOLN
0.4000 mg | Freq: Once | INTRAVENOUS | Status: AC
  Administered 2019-01-27: 0.4 mg via INTRAVENOUS

## 2019-01-29 ENCOUNTER — Telehealth: Payer: Self-pay | Admitting: *Deleted

## 2019-01-29 NOTE — Telephone Encounter (Signed)
Spoke to son, Darryl - per pt instructions. Pt started Amiodarone. DCCV scheduled for 12/30, instructions reviewed w/ son. Covid screening scheduled for 12/26. Aware office will call to arrange f/u w/ Dr. Curt Bears. Son verbalized understanding and agreeable to plan.

## 2019-01-31 ENCOUNTER — Other Ambulatory Visit: Payer: Self-pay | Admitting: Neurology

## 2019-01-31 DIAGNOSIS — R0789 Other chest pain: Secondary | ICD-10-CM

## 2019-01-31 DIAGNOSIS — I4891 Unspecified atrial fibrillation: Secondary | ICD-10-CM

## 2019-01-31 MED ORDER — APIXABAN 5 MG PO TABS: 5.0000 mg | ORAL_TABLET | Freq: Two times a day (BID) | ORAL | 0 refills | Status: DC

## 2019-02-03 ENCOUNTER — Telehealth: Payer: Self-pay

## 2019-02-03 MED ORDER — FAMOTIDINE 20 MG PO TABS: 20.0000 mg | ORAL_TABLET | Freq: Two times a day (BID) | ORAL | 1 refills | Status: DC

## 2019-02-03 NOTE — Telephone Encounter (Signed)
[facility] pharmacy requesting med refills for famotidine. Pt transition care from Dr. Georgina Snell. Pls advise, thanks.

## 2019-02-04 NOTE — Progress Notes (Deleted)
Subjective:   Carmen Cooper is a 78 y.o. female who presents for Medicare Annual (Subsequent) preventive examination.  Review of Systems:  No ROS.  Medicare Wellness Virtual Visit.  Visual/audio telehealth visit, UTA vital signs.   See social history for additional risk factors.      Sleep patterns:    Home Safety/Smoke Alarms: Feels safe in home. Smoke alarms in place.  Living environment;  Seat Belt Safety/Bike Helmet: Wears seat belt.   Female:   Pap- Aged out      Mammo- Aged out      Dexa scan-        CCS- Aged out     Objective:     Vitals: There were no vitals taken for this visit.  There is no height or weight on file to calculate BMI.  Advanced Directives 04/11/2018 02/12/2018  Does Patient Have a Medical Advance Directive? Yes Yes  Type of Paramedic of [place];Living will [place];Living will  Does patient want to make changes to medical advance directive? - No - Patient declined  Copy of [place] in Chart? - No - copy requested    Tobacco Social History   Tobacco Use  Smoking Status Current Every Day Smoker  . Packs/day: 0.25  . Years: 20.00  . Pack years: 5.00  Smokeless Tobacco Never Used     Ready to quit: Not Answered Counseling given: Not Answered   Clinical Intake:                       Past Medical History:  Diagnosis Date  . BCC (basal cell carcinoma of skin)   . Diabetes ([place])   . Glaucoma   . History of TIA (transient ischemic attack) 08/11/2013   12/2012 - [name]   . Hypertension   . Hypothyroidism 08/11/2013  . Microscopic colitis 08/21/2013   2008 [facility] [name].  Normal colonoscopy 2009 repeat as routine in 2019   . Thyroid disease   . 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, [facility]    Past Surgical History:  Procedure Laterality Date  . MOHS SURGERY  2019   Nose bcc    Family History  Problem Relation Age of  Onset  . Heart disease Son   . Diabetes Neg Hx    Social History   Socioeconomic History  . Marital status: Married    Spouse name: Rachel Bo  . Number of children: 1  . Years of education: 61  . Highest education level: 12th grade  Occupational History  . Occupation: Hair dresser    Comment: retired  Tobacco Use  . Smoking status: Current Every Day Smoker    Packs/day: 0.25    Years: 20.00    Pack years: 5.00  . Smokeless tobacco: Never Used  Substance and Sexual Activity  . Alcohol use: Yes    Alcohol/week: 2.0 standard drinks    Types: 2 Shots of liquor per week    Comment: 2-3 a day  . Drug use: No  . Sexual activity: Not Currently  Other Topics Concern  . Not on file  Social History Narrative   Patient takes care of her husband who has dementia. Doesn't get out much. Drinks 2-3 cups of hot tea daily   Social Determinants of Health   Financial Resource Strain: Low Risk   . Difficulty of Paying Living Expenses: Not hard at all  Food Insecurity:  No Food Insecurity  . Worried About Charity fundraiser in the Last Year: Never true  . Ran Out of Food in the Last Year: Never true  Transportation Needs: No Transportation Needs  . Lack of Transportation (Medical): No  . Lack of Transportation (Non-Medical): No  Physical Activity: Inactive  . Days of Exercise per Week: 0 days  . Minutes of Exercise per Session: 0 min  Stress: No Stress Concern Present  . Feeling of Stress : Not at all  Social Connections: Somewhat Isolated  . Frequency of Communication with Friends and Family: More than three times a week  . Frequency of Social Gatherings with Friends and Family: Never  . Attends Religious Services: Never  . Active Member of Clubs or Organizations: No  . Attends Archivist Meetings: Never  . Marital Status: Married    Outpatient Encounter Medications as of 02/17/2019  Medication Sig  . AMBULATORY NON FORMULARY MEDICATION Freestyle light test strips Test twice  a day  Dx type 2 diabetes E11.9  . amiodarone (PACERONE) 200 MG tablet Take 2 tablets (400 mg total) TWICE a day for 2 weeks, then take 1 tablet (200 mg total) TWICE a day for 2 weeks, then take 1 tablet ONCE daily (Patient not taking: Reported on 01/30/2019)  . amiodarone (PACERONE) 200 MG tablet Take 1 tablet (200 mg total) by mouth daily.  Marland Kitchen amLODipine (NORVASC) 5 MG tablet Take 1 tablet (5 mg total) by mouth daily. (Patient not taking: Reported on 01/30/2019)  . apixaban (ELIQUIS) 5 MG TABS tablet Take 1 tablet (5 mg total) by mouth 2 (two) times daily. CONTACT CARDIOLOGY FOR FUTURE REFILLS  . atorvastatin (LIPITOR) 40 MG tablet Take 1 tablet (40 mg total) by mouth daily.  . diclofenac sodium (VOLTAREN) 1 % GEL Apply 4 g topically 4 (four) times daily. To affected joint. (Patient not taking: Reported on 01/30/2019)  . Dorzolamide HCl-Timolol Mal PF 22.3-6.8 MG/ML SOLN Place 1 drop into both eyes 2 (two) times daily.   . famotidine (PEPCID) 20 MG tablet Take 1 tablet (20 mg total) by mouth 2 (two) times daily.  . folic acid (FOLVITE) 1 MG tablet Take 1 mg by mouth daily.   Marland Kitchen FREESTYLE LITE test strip   . hydrochlorothiazide (HYDRODIURIL) 12.5 MG tablet TAKE 1 TABLET DAILY (Patient not taking: Reported on 01/30/2019)  . Insulin Human (INSULIN PUMP) SOLN Inject into the skin. insulin lispro (HUMALOG) 100 UNIT/ML  . insulin lispro (HUMALOG) 100 UNIT/ML injection Medtronic 630G pump.  Basal 12-6a 0.625, 6a-7p 0.725, 7p-12a 0.625.  Preset bolus:  4/5/6.  ISF 50.  Total daily dose:  40 units/day  . levothyroxine (SYNTHROID, LEVOTHROID) 112 MCG tablet Take 1 tablet (112 mcg total) by mouth daily.  Marland Kitchen lisinopril (ZESTRIL) 20 MG tablet Take 1 tablet (20 mg total) by mouth daily.  . methotrexate (RHEUMATREX) 2.5 MG tablet Take 25 mg by mouth every Monday.   . metoprolol succinate (TOPROL-XL) 50 MG 24 hr tablet Take 1 tablet (50 mg total) by mouth daily. Take with or immediately following a meal.  .  nepafenac (NEVANAC) 0.1 % ophthalmic suspension Place 1 drop into both eyes 2 (two) times daily.   . Omega-3 1000 MG CAPS Take 1,000 mg by mouth daily.   Marland Kitchen ZIOPTAN 0.0015 % SOLN Place 1 drop into both eyes at bedtime.    No facility-administered encounter medications on file as of 02/17/2019.    Activities of Daily Living In your present state of health,  do you have any difficulty performing the following activities: 02/12/2018  Hearing? N  Vision? Y  Comment has glaucoma sees Dr. Manuella Ghazi for this  Difficulty concentrating or making decisions? N  Walking or climbing stairs? N  Dressing or bathing? N  Doing errands, shopping? N  Preparing Food and eating ? N  Using the Toilet? N  In the past six months, have you accidently leaked urine? N  Do you have problems with loss of bowel control? N  Managing your Medications? N  Managing your Finances? N  Housekeeping or managing your Housekeeping? N  Some recent data might be hidden    Patient Care Team: Emeterio Reeve, DO as PCP - General (Osteopathic Medicine) Constance Haw, MD as PCP - Electrophysiology (Cardiology) Janie Morning, MD (Gastroenterology) Gerome Apley, MD as Referring Physician (Endocrinology) Feliz Beam, MD as Referring Physician (Ophthalmology) Ander Slade Carlisle Beers, MD as Referring Physician (Ophthalmology) Constance Haw, MD as Consulting Physician (Cardiology)    Assessment:   This is a routine wellness examination for Shaaron.Physical assessment deferred to PCP.   Exercise Activities and Dietary recommendations   Diet  Breakfast: Lunch:  Dinner:       Goals    . stress management     Manage stress at home better with taking care of husband with dementia       Fall Risk Fall Risk  01/02/2019 02/12/2018 12/28/2017 10/30/2016 10/22/2015  Falls in the past year? 0 0 0 No No  Comment - - - - -  Number falls in past yr: 0 - 0 - -  Injury with Fall? 0 - 0 - -  Follow up - - Falls  evaluation completed - -   Is the patient's home free of loose throw rugs in walkways, pet beds, electrical cords, etc?   {Blank single:19197::"yes","no"}      Grab bars in the bathroom? {Blank single:19197::"yes","no"}      Handrails on the stairs?   {Blank single:19197::"yes","no"}      Adequate lighting?   {Blank single:19197::"yes","no"}   Depression Screen PHQ 2/9 Scores 01/02/2019 04/03/2018 02/12/2018 06/19/2017  PHQ - 2 Score 0 5 2 5   PHQ- 9 Score - 13 6 16      Cognitive Function     6CIT Screen 02/12/2018  What Year? 0 points  What month? 0 points  What time? 0 points  Count back from 20 0 points  Months in reverse 0 points  Repeat phrase 0 points  Total Score 0    Immunization History  Administered Date(s) Administered  . 19-influenza Whole 10/24/2018  . Fluad Quad(high Dose 65+) 10/24/2018  . Influenza Whole 10/22/2015, 10/30/2016, 11/30/2016, 11/19/2017  . Influenza, High Dose Seasonal PF 10/22/2015, 10/30/2016  . Influenza,inj,Quad PF,6+ Mos 11/11/2013, 09/27/2017  . Influenza-Unspecified 11/14/2014  . Pneumococcal Conjugate-13 10/22/2015  . Pneumococcal Polysaccharide-23 12/25/2012, 03/06/2017  . Pneumococcal-Unspecified 12/25/2012, 03/06/2017  . Tdap 07/26/2015  . Zoster 11/04/2015    Screening Tests Health Maintenance  Topic Date Due  . FOOT EXAM  04/04/2019  . HEMOGLOBIN A1C  05/29/2019  . OPHTHALMOLOGY EXAM  11/04/2019  . TETANUS/TDAP  07/25/2025  . INFLUENZA VACCINE  Completed  . DEXA SCAN  Completed  . PNA vac Low Risk Adult  Completed        Plan:   ***   I have personally reviewed and noted the following in the patient's chart:   . Medical and social history . Use of alcohol, tobacco or illicit drugs  .  Current medications and supplements . Functional ability and status . Nutritional status . Physical activity . Advanced directives . List of other physicians . Hospitalizations, surgeries, and ER visits in previous 12  months . Vitals . Screenings to include cognitive, depression, and falls . Referrals and appointments  In addition, I have reviewed and discussed with patient certain preventive protocols, quality metrics, and best practice recommendations. A written personalized care plan for preventive services as well as general preventive health recommendations were provided to patient.     Joanne Chars, LPN  [id number]

## 2019-02-08 ENCOUNTER — Other Ambulatory Visit (HOSPITAL_COMMUNITY)
Admission: RE | Admit: 2019-02-08 | Discharge: 2019-02-08 | Disposition: A | Discharge status: Home / Self Care | Payer: Medicare Other | Source: Ambulatory Visit | Attending: Internal Medicine | Admitting: Internal Medicine

## 2019-02-08 DIAGNOSIS — Z01812 Encounter for preprocedural laboratory examination: Secondary | ICD-10-CM | POA: Insufficient documentation

## 2019-02-08 DIAGNOSIS — Z20828 Contact with and (suspected) exposure to other viral communicable diseases: Secondary | ICD-10-CM | POA: Insufficient documentation

## 2019-02-09 LAB — NOVEL CORONAVIRUS, NAA (HOSP ORDER, SEND-OUT TO REF LAB; TAT 18-24 HRS): SARS-CoV-2, NAA: NOT DETECTED

## 2019-02-12 ENCOUNTER — Ambulatory Visit (HOSPITAL_COMMUNITY): Payer: Medicare Other | Admitting: Certified Registered Nurse Anesthetist

## 2019-02-12 ENCOUNTER — Ambulatory Visit (HOSPITAL_COMMUNITY)
Admission: RE | Admit: 2019-02-12 | Discharge: 2019-02-12 | Disposition: A | Discharge status: Home / Self Care | Payer: Medicare Other | Attending: Internal Medicine | Admitting: Internal Medicine

## 2019-02-12 ENCOUNTER — Other Ambulatory Visit: Payer: Self-pay

## 2019-02-12 ENCOUNTER — Encounter (HOSPITAL_COMMUNITY)
Admission: RE | Disposition: A | Discharge status: Home / Self Care | Payer: Medicare Other | Source: Home / Self Care | Attending: Internal Medicine

## 2019-02-12 ENCOUNTER — Encounter (HOSPITAL_COMMUNITY): Payer: Self-pay | Admitting: Internal Medicine

## 2019-02-12 DIAGNOSIS — Z7901 Long term (current) use of anticoagulants: Secondary | ICD-10-CM | POA: Diagnosis not present

## 2019-02-12 DIAGNOSIS — Z79899 Other long term (current) drug therapy: Secondary | ICD-10-CM | POA: Diagnosis not present

## 2019-02-12 DIAGNOSIS — I110 Hypertensive heart disease with heart failure: Secondary | ICD-10-CM | POA: Diagnosis not present

## 2019-02-12 DIAGNOSIS — I4891 Unspecified atrial fibrillation: Secondary | ICD-10-CM | POA: Diagnosis present

## 2019-02-12 DIAGNOSIS — E119 Type 2 diabetes mellitus without complications: Secondary | ICD-10-CM | POA: Insufficient documentation

## 2019-02-12 DIAGNOSIS — F17210 Nicotine dependence, cigarettes, uncomplicated: Secondary | ICD-10-CM | POA: Diagnosis not present

## 2019-02-12 DIAGNOSIS — Z79890 Hormone replacement therapy (postmenopausal): Secondary | ICD-10-CM | POA: Diagnosis not present

## 2019-02-12 DIAGNOSIS — I5022 Chronic systolic (congestive) heart failure: Secondary | ICD-10-CM | POA: Diagnosis not present

## 2019-02-12 DIAGNOSIS — Z794 Long term (current) use of insulin: Secondary | ICD-10-CM | POA: Insufficient documentation

## 2019-02-12 DIAGNOSIS — Z7982 Long term (current) use of aspirin: Secondary | ICD-10-CM | POA: Insufficient documentation

## 2019-02-12 DIAGNOSIS — E039 Hypothyroidism, unspecified: Secondary | ICD-10-CM | POA: Insufficient documentation

## 2019-02-12 DIAGNOSIS — I4819 Other persistent atrial fibrillation: Secondary | ICD-10-CM | POA: Diagnosis not present

## 2019-02-12 HISTORY — PX: CARDIOVERSION: SHX1299

## 2019-02-12 LAB — POCT I-STAT, CHEM 8
BUN: 21 mg/dL (ref 8–23)
Calcium, Ion: 1.09 mmol/L — ABNORMAL LOW (ref 1.15–1.40)
Chloride: 100 mmol/L (ref 98–111)
Creatinine, Ser: 0.9 mg/dL (ref 0.44–1.00)
Glucose, Bld: 226 mg/dL — ABNORMAL HIGH (ref 70–99)
HCT: 31 % — ABNORMAL LOW (ref 36.0–46.0)
Hemoglobin: 10.5 g/dL — ABNORMAL LOW (ref 12.0–15.0)
Potassium: 3.7 mmol/L (ref 3.5–5.1)
Sodium: 135 mmol/L (ref 135–145)
TCO2: 25 mmol/L (ref 22–32)

## 2019-02-12 SURGERY — CARDIOVERSION
Anesthesia: General

## 2019-02-12 MED ORDER — PROPOFOL 10 MG/ML IV BOLUS
INTRAVENOUS | Status: DC | PRN
  Administered 2019-02-12: 50 mg via INTRAVENOUS

## 2019-02-12 MED ORDER — APIXABAN 5 MG PO TABS
5.0000 mg | ORAL_TABLET | Freq: Once | ORAL | Status: AC
  Administered 2019-02-12: 5 mg via ORAL
  Filled 2019-02-12: qty 1

## 2019-02-12 MED ORDER — SODIUM CHLORIDE 0.9 % IV SOLN: INTRAVENOUS | Status: DC | PRN

## 2019-02-12 MED ORDER — LIDOCAINE 2% (20 MG/ML) 5 ML SYRINGE
INTRAMUSCULAR | Status: DC | PRN
  Administered 2019-02-12: 40 mg via INTRAVENOUS

## 2019-02-12 NOTE — Anesthesia Procedure Notes (Signed)
Procedure Name: General with mask airway Date/Time: 02/12/2019 10:01 AM Performed by: Janene Harvey, CRNA Pre-anesthesia Checklist: Patient identified, Emergency Drugs available, Suction available and Patient being monitored Oxygen Delivery Method: Ambu bag Dental Injury: Teeth and Oropharynx as per pre-operative assessment

## 2019-02-12 NOTE — Discharge Instructions (Signed)

## 2019-02-12 NOTE — H&P (Signed)
   INTERVAL PROCEDURE H&P  History and Physical Interval Note:  02/12/2019 9:31 AM  Carmen Cooper has presented today for their planned procedure. The various methods of treatment have been discussed with the patient and family. After consideration of risks, benefits and other options for treatment, the patient has consented to the procedure.  The patients' outpatient history has been reviewed, patient examined, and no change in status from most recent office note within the past 30 days. I have reviewed the patients' chart and labs and will proceed as planned. Questions were answered to the patient's satisfaction.   [name], MD, [facility], [place] Director of the Advanced Lipid Disorders &  Cardiovascular Risk Reduction Clinic Diplomate of the American Board of Clinical Lipidology Attending Cardiologist  Direct Dial: [phone]  Fax: [phone]  Website:  [email or web address].Carmen Cooper 02/12/2019, 9:31 AM

## 2019-02-12 NOTE — Anesthesia Preprocedure Evaluation (Addendum)
Anesthesia Evaluation  Patient identified by MRN, date of birth, ID band Patient awake    Reviewed: Allergy & Precautions, NPO status , Patient's Chart, lab work & pertinent test results  Airway Mallampati: II       Dental  (+) Teeth Intact   Pulmonary Current Smoker,    breath sounds clear to auscultation       Cardiovascular hypertension, Pt. on medications  Rhythm:Irregular Rate:Abnormal     Neuro/Psych negative neurological ROS  negative psych ROS   GI/Hepatic Neg liver ROS, GERD  Medicated,  Endo/Other  diabetes, Type 2, Insulin DependentHypothyroidism   Renal/GU      Musculoskeletal  (+) Arthritis ,   Abdominal Normal abdominal exam  (+)   Peds  Hematology negative hematology ROS (+)   Anesthesia Other Findings   Reproductive/Obstetrics                            Echo:  1. Left ventricular ejection fraction, by visual estimation, is 40 to 45%. The left ventricle has moderately decreased function. There is global hypokinesis of the left ventricle. Left ventricular septal wall thickness was mildly increased. Mildly  increased left ventricular posterior wall thickness. There is mildly increased left ventricular hypertrophy.  2. Left ventricular diastolic parameters are indeterminate.  3. Global right ventricular systolic function is normal visually.The right ventricular size is normal. No increase in right ventricular wall thickness.  4. Left atrial size was severely dilated.  5. Right atrial size was severely dilated.  6. The mitral valve is normal in structure. Mild to moderate mitral valve regurgitation. No evidence of mitral stenosis.  7. The tricuspid valve is normal in structure. Tricuspid valve regurgitation moderate.  8. The aortic valve is normal in structure. Aortic valve regurgitation is not visualized. No evidence of aortic valve sclerosis or stenosis.  9. The pulmonic valve  was not well visualized. Pulmonic valve regurgitation is not visualized. 10. Trivial circumferential pericardial effusion is present. 11. Mildly elevated pulmonary artery systolic pressure.  Anesthesia Physical Anesthesia Plan  ASA: III  Anesthesia Plan: General   Post-op Pain Management:    Induction:   PONV Risk Score and Plan: 0  Airway Management Planned: Natural Airway and Simple Face Mask  Additional Equipment: None  Intra-op Plan:   Post-operative Plan:   Informed Consent: I have reviewed the patients History and Physical, chart, labs and discussed the procedure including the risks, benefits and alternatives for the proposed anesthesia with the patient or authorized representative who has indicated his/her understanding and acceptance.       Plan Discussed with: CRNA  Anesthesia Plan Comments:        Anesthesia Quick Evaluation

## 2019-02-12 NOTE — Anesthesia Postprocedure Evaluation (Signed)
Anesthesia Post Note  Patient: Carmen Cooper  Procedure(s) Performed: CARDIOVERSION (N/A )     Patient location during evaluation: PACU Anesthesia Type: General Level of consciousness: awake and alert Pain management: pain level controlled Vital Signs Assessment: post-procedure vital signs reviewed and stable Respiratory status: spontaneous breathing, nonlabored ventilation, respiratory function stable and patient connected to nasal cannula oxygen Cardiovascular status: blood pressure returned to baseline and stable Postop Assessment: no apparent nausea or vomiting Anesthetic complications: no    Last Vitals:  Vitals:   02/12/19 1023 02/12/19 1033  BP: (!) 169/94 138/76  Pulse: 72 63  Resp: 15 17  Temp:    SpO2: 100% 99%    Last Pain:  Vitals:   02/12/19 1033  TempSrc:   PainSc: 0-No pain                 Effie Berkshire

## 2019-02-12 NOTE — CV Procedure (Signed)
   CARDIOVERSION NOTE  Procedure: Electrical Cardioversion Indications:  Atrial Fibrillation  Procedure Details:  Consent: Risks of procedure as well as the alternatives and risks of each were explained to the (patient/caregiver).  Consent for procedure obtained.  Time Out: Verified patient identification, verified procedure, site/side was marked, verified correct patient position, special equipment/implants available, medications/allergies/relevent history reviewed, required imaging and test results available.  Performed  Patient placed on cardiac monitor, pulse oximetry, supplemental oxygen as necessary.  Sedation given: propofol per anesthesia Pacer pads placed anterior and posterior chest.  Cardioverted 2 time(s).  Cardioverted at 150J and 200J biphasic.  Impression: Findings: Post procedure EKG shows: NSR Complications: None Patient did tolerate procedure well.  Plan: 1. Successful DCCV after 2 stacked shocks.  Time Spent Directly with the Patient:  30 minutes   [name], MD, [facility], [place] Director of the [facility]  Cardiovascular Risk Reduction Clinic Diplomate of the American Board of Clinical Lipidology Attending Cardiologist  Direct Dial: [phone]  Fax: [phone]  Website:  [email or web address] Delsa Walder 02/12/2019, 10:09 AM

## 2019-02-12 NOTE — Transfer of Care (Signed)
Immediate Anesthesia Transfer of Care Note  Patient: Carmen Cooper  Procedure(s) Performed: CARDIOVERSION (N/A )  Patient Location: Endoscopy Unit  Anesthesia Type:General  Level of Consciousness: drowsy  Airway & Oxygen Therapy: Patient Spontanous Breathing  Post-op Assessment: Report given to RN and Post -op Vital signs reviewed and stable  Post vital signs: Reviewed  Last Vitals:  Vitals Value Taken Time  BP    Temp    Pulse    Resp    SpO2      Last Pain:  Vitals:   02/12/19 0924  TempSrc: Temporal  PainSc: 0-No pain         Complications: No apparent anesthesia complications

## 2019-02-17 ENCOUNTER — Encounter: Payer: Self-pay | Admitting: Physician Assistant

## 2019-02-17 ENCOUNTER — Ambulatory Visit: Payer: Medicare Other

## 2019-02-17 ENCOUNTER — Ambulatory Visit (INDEPENDENT_AMBULATORY_CARE_PROVIDER_SITE_OTHER): Payer: Medicare Other | Admitting: Physician Assistant

## 2019-02-17 VITALS — Ht 65.0 in | Wt 127.0 lb

## 2019-02-17 DIAGNOSIS — I4891 Unspecified atrial fibrillation: Secondary | ICD-10-CM | POA: Diagnosis not present

## 2019-02-17 DIAGNOSIS — Z9889 Other specified postprocedural states: Secondary | ICD-10-CM

## 2019-02-17 DIAGNOSIS — D649 Anemia, unspecified: Secondary | ICD-10-CM

## 2019-02-17 DIAGNOSIS — R0602 Shortness of breath: Secondary | ICD-10-CM

## 2019-02-17 DIAGNOSIS — R300 Dysuria: Secondary | ICD-10-CM

## 2019-02-17 DIAGNOSIS — R42 Dizziness and giddiness: Secondary | ICD-10-CM | POA: Diagnosis not present

## 2019-02-17 DIAGNOSIS — Z9289 Personal history of other medical treatment: Secondary | ICD-10-CM

## 2019-02-17 DIAGNOSIS — F5101 Primary insomnia: Secondary | ICD-10-CM | POA: Diagnosis not present

## 2019-02-17 MED ORDER — AMOXICILLIN-POT CLAVULANATE 500-125 MG PO TABS: 1.0000 | ORAL_TABLET | Freq: Two times a day (BID) | ORAL | 0 refills | Status: DC

## 2019-02-17 NOTE — Progress Notes (Signed)
Patient ID: Carmen Cooper, female   DOB: [birth year], 79 y.o.   MRN: [id number] .Marland KitchenVirtual Visit via Video Note  I connected with Carmen Cooper on 02/17/2019 at  2:40 PM EST by a video enabled telemedicine application and verified that I am speaking with the correct person using two identifiers.  Location: Patient: home Provider: clinic   I discussed the limitations of evaluation and management by telemedicine and the availability of in person appointments. The patient expressed understanding and agreed to proceed.  History of Present Illness: Pt is a 79 year old female with uncontrolled diabetes, CKD, A. fib, hyperlipidemia, hypothyroidism who calls into the clinic to discuss lightheadedness and shortness of breath.  December 30 she had a cardioversion procedure for her atrial fibrillation.  She does not have a follow-up with cardiology until March.  Her son has palpated her pulse and does feel like she is in normal rhythm.  She does not feel like she has any palpitations.  She noticed about 24 hours after procedure that she was feeling more lightheaded and short of breath with small activities.  She denies any lower leg edema, tenderness, pain.  She is taking her anticoagulant, Eliquis.  Important to note that before procedure her hemoglobin was 10.5 which was previously 13.9 at baseline.  She was not told of this.  She is also had some new dysuria and odor.  Patient denies any fever, body aches, chills, headache.  She has not tried anything to make better.  She is not sleeping well.  She denies any upper abdominal pain. She has some intermittent lower abdominal pain. She feels like she may have a hemorrhoid due to painful bowel movements and feels a "lump down there". Pt denies any black tarry stools or bright red blood in stool.      .. Active Ambulatory Problems    Diagnosis Date Noted  . Hyperlipidemia 08/11/2013  . History of TIA (transient ischemic attack) 08/11/2013  . Hypertension associated  with diabetes ([place]) 08/11/2013  . Osteopenia 08/11/2013  . Hypothyroidism 08/11/2013  . Preventive measure 08/21/2013  . H/O left breast biopsy 08/21/2013  . Microscopic colitis 08/21/2013  . Edema 02/10/2014  . Dysphagia 02/10/2014  . Seborrheic keratoses 03/16/2014  . 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 Venous stasis dermatitis 08/12/2014  . Uveitis 01/26/2015  . Uncontrolled diabetes mellitus with eye complications ([place]) [id number]  . Uncontrolled diabetes mellitus with neurologic complication ([place]) [id number]  . Fatigue 12/06/2015  . Abdominal cramping 03/06/2016  . Right knee DJD 06/19/2017  . Aortic atherosclerosis ([place]) 04/04/2018  . Insomnia 09/19/2018  . CKD (chronic kidney disease) stage 3, GFR 30-59 ml/min 12/03/2018  . Atrial fibrillation ([place]) 01/03/2019   Resolved Ambulatory Problems    Diagnosis Date Noted  . 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 Right knee pain 08/11/2013  . Lesion of nose 03/06/2017  . Basal cell carcinoma (BCC) of skin of nose 05/08/2017   Past Medical History:  Diagnosis Date  . BCC (basal cell carcinoma of skin)   . Diabetes ([place])   . Glaucoma   . Hypertension   . Thyroid disease    Reviewed med, allergies, problem list.   Observations/Objective: No acute distress. Appears pale.  Normal mood.  No labored breathing.   .. Today's Vitals   02/17/19 1441  Weight: 127 lb (57.6 kg)  Height: 5\' 5"  (1.651 m)   Body mass index is 21.13 kg/m.  .. Results for orders placed or performed in visit on 02/17/19  CBC  Result Value Ref Range   WBC 4.7 3.8 - 10.8 Thousand/uL   RBC 2.67 (L) 3.80 - 5.10 Million/uL   Hemoglobin 10.0 (L) 11.7 - 15.5 g/dL   HCT 29.1 (L) 35.0 - 45.0 %   MCV 109.0 (H) 80.0 - 100.0 fL   MCH 37.5 (H) 27.0 - 33.0 pg   MCHC 34.4 32.0 - 36.0 g/dL   RDW 21.7 (H) 11.0 - 15.0 %   Platelets 163 140 - 400 Thousand/uL   MPV 12.1 7.5 - 12.5 fL  Urinalysis with Culture Reflex   Specimen:  Urine  Result Value Ref Range   Color, Urine DARK YELLOW YELLOW   APPearance CLOUDY (A) CLEAR   Specific Gravity, Urine 1.026 1.001 - 1.03   pH 7.0 5.0 - 8.0   Glucose, UA TRACE (A) NEGATIVE   Bilirubin Urine NEGATIVE NEGATIVE   Ketones, ur TRACE (A) NEGATIVE   Hgb urine dipstick TRACE (A) NEGATIVE   Protein, ur 2+ (A) NEGATIVE   Nitrites, Initial NEGATIVE NEGATIVE   Leukocyte Esterase 3+ (A) NEGATIVE   WBC, UA > OR = 60 (A) 0 - 5 /HPF   RBC / HPF 3-10 (A) 0 - 2 /HPF   Squamous Epithelial / LPF 10-20 (A) < OR = 5 /HPF   Bacteria, UA NONE SEEN NONE SEEN /HPF   Hyaline Cast 0-1 (A) NONE SEEN /LPF  Basic metabolic panel  Result Value Ref Range   Glucose, Bld 92 65 - 99 mg/dL   BUN 17 7 - 25 mg/dL   Creat 1.05 (H) 0.60 - 0.93 mg/dL   BUN/Creatinine Ratio 16 6 - 22 (calc)   Sodium 140 135 - 146 mmol/L   Potassium 4.1 3.5 - 5.3 mmol/L   Chloride 103 98 - 110 mmol/L   CO2 27 20 - 32 mmol/L   Calcium 8.6 8.6 - 10.4 mg/dL  Ferritin  Result Value Ref Range   Ferritin 382 (H) 16 - 288 ng/mL  REFLEXIVE URINE CULTURE  Result Value Ref Range   REFLEXIVE URINE CULTURE       Assessment and Plan: Marland KitchenMarland KitchenTnya was seen today for dysuria.  Diagnoses and all orders for this visit:  Light headed -     CBC -     Urinalysis with Culture Reflex -     Basic metabolic panel -     Ferritin -     Ambulatory referral to Gastroenterology  Low hemoglobin -     CBC -     Basic metabolic panel -     Ferritin -     Ambulatory referral to Gastroenterology  Dysuria -     CBC -     Urinalysis with Culture Reflex -     amoxicillin-clavulanate (AUGMENTIN) 500-125 MG tablet; Take 1 tablet (500 mg total) by mouth 2 (two) times daily. For 10 days. -     Urine Culture -     REFLEXIVE URINE CULTURE  SOB (shortness of breath) -     CBC -     Basic metabolic panel -     Ambulatory referral to Gastroenterology  History of cardioversion  Anemia, unspecified type -     Ambulatory referral to  Gastroenterology  Atrial fibrillation, unspecified type ([place])  Primary insomnia  unclear etiology of light-headedness and SOB.  Concern for Blood Clot is low on eliquis. Concerned with hgb at 10.5 before procedure. Anemia could cause SOB and light-headedness.   Concern for urinary tract infection.  Will treat  empirically with Augmentin due to sulfa allergy and contraindication with macrobid. Stay hydrated. Came to lab and positive for leukocytes/protein/blood but no bacteria seen in microscopic. Will culture.   HgB has dropped to 10.0 from 10.5 last week. This is concerning for some blood loss. Ferritin is elevated.  I will refer to GI for evaluation.   For sleep continue trazodone for now. Can consider other medication after she is feeling better.   Follow up in 1 week or if symptoms worsen with PCP or myself.     Follow Up Instructions:    I discussed the assessment and treatment plan with the patient. The patient was provided an opportunity to ask questions and all were answered. The patient agreed with the plan and demonstrated an understanding of the instructions.   The patient was advised to call back or seek an in-person evaluation if the symptoms worsen or if the condition fails to improve as anticipated.   Iran Planas, PA-C

## 2019-02-17 NOTE — Progress Notes (Signed)
Wednesday last week - cardioversion for atrial fib Thursday Started feeling "fuzzy headed", "wobbly" Not sleeping Burning with urination Also thinks she has a hemorrhoid, having pain with stool and feels lump

## 2019-02-18 ENCOUNTER — Encounter: Payer: Self-pay | Admitting: Physician Assistant

## 2019-02-18 NOTE — Progress Notes (Signed)
Carmen Cooper,   Your hemoglobin did drop a little more which certainly could cause you to feel short of breath and light headed. I want to let you see GI for evaluation of any internal bleeding. I will make referral.   No bacteria seen in microscopic. Continue augmentin until we get confirmation with urine culture.   Kidney function stable.   Luvenia Starch

## 2019-02-19 DIAGNOSIS — I4891 Unspecified atrial fibrillation: Secondary | ICD-10-CM | POA: Diagnosis not present

## 2019-02-19 DIAGNOSIS — K219 Gastro-esophageal reflux disease without esophagitis: Secondary | ICD-10-CM | POA: Diagnosis not present

## 2019-02-19 LAB — BASIC METABOLIC PANEL
BUN/Creatinine Ratio: 16 (calc) (ref 6–22)
BUN: 17 mg/dL (ref 7–25)
CO2: 27 mmol/L (ref 20–32)
Calcium: 8.6 mg/dL (ref 8.6–10.4)
Chloride: 103 mmol/L (ref 98–110)
Creat: 1.05 mg/dL — ABNORMAL HIGH (ref 0.60–0.93)
Glucose, Bld: 92 mg/dL (ref 65–99)
Potassium: 4.1 mmol/L (ref 3.5–5.3)
Sodium: 140 mmol/L (ref 135–146)

## 2019-02-19 LAB — CBC
HCT: 29.1 % — ABNORMAL LOW (ref 35.0–45.0)
Hemoglobin: 10 g/dL — ABNORMAL LOW (ref 11.7–15.5)
MCH: 37.5 pg — ABNORMAL HIGH (ref 27.0–33.0)
MCHC: 34.4 g/dL (ref 32.0–36.0)
MCV: 109 fL — ABNORMAL HIGH (ref 80.0–100.0)
MPV: 12.1 fL (ref 7.5–12.5)
Platelets: 163 10*3/uL (ref 140–400)
RBC: 2.67 10*6/uL — ABNORMAL LOW (ref 3.80–5.10)
RDW: 21.7 % — ABNORMAL HIGH (ref 11.0–15.0)
WBC: 4.7 10*3/uL (ref 3.8–10.8)

## 2019-02-19 LAB — URINALYSIS W MICROSCOPIC + REFLEX CULTURE
Bacteria, UA: NONE SEEN /HPF
Bilirubin Urine: NEGATIVE
Nitrites, Initial: NEGATIVE
Specific Gravity, Urine: 1.026 (ref 1.001–1.03)
WBC, UA: >=60 /HPF — AB (ref 0–5)
pH: 7 (ref 5.0–8.0)

## 2019-02-19 LAB — URINE CULTURE
MICRO NUMBER:: 10005732
SPECIMEN QUALITY:: ADEQUATE

## 2019-02-19 LAB — FERRITIN: Ferritin: 382 ng/mL — ABNORMAL HIGH (ref 16–288)

## 2019-02-19 LAB — CULTURE INDICATED

## 2019-02-19 NOTE — Progress Notes (Signed)
Pt can stop antibiotic urine culture did not show any significant bacteria other than normal flora contamination. How are her symptoms today? Any new symptoms? Do you have appt with GI yet?

## 2019-02-24 ENCOUNTER — Other Ambulatory Visit: Payer: Self-pay | Admitting: Cardiology

## 2019-02-24 ENCOUNTER — Other Ambulatory Visit: Payer: Self-pay

## 2019-02-24 ENCOUNTER — Encounter: Payer: Self-pay | Admitting: Nurse Practitioner

## 2019-02-24 ENCOUNTER — Ambulatory Visit (INDEPENDENT_AMBULATORY_CARE_PROVIDER_SITE_OTHER): Payer: Medicare Other | Admitting: Nurse Practitioner

## 2019-02-24 VITALS — BP 153/73 | HR 60 | Temp 98.0°F | Ht 65.0 in | Wt 124.1 lb

## 2019-02-24 DIAGNOSIS — D521 Drug-induced folate deficiency anemia: Secondary | ICD-10-CM

## 2019-02-24 DIAGNOSIS — D649 Anemia, unspecified: Secondary | ICD-10-CM

## 2019-02-24 DIAGNOSIS — K228 Other specified diseases of esophagus: Secondary | ICD-10-CM | POA: Diagnosis not present

## 2019-02-24 DIAGNOSIS — K449 Diaphragmatic hernia without obstruction or gangrene: Secondary | ICD-10-CM | POA: Diagnosis not present

## 2019-02-24 DIAGNOSIS — R5383 Other fatigue: Secondary | ICD-10-CM | POA: Diagnosis not present

## 2019-02-24 DIAGNOSIS — K259 Gastric ulcer, unspecified as acute or chronic, without hemorrhage or perforation: Secondary | ICD-10-CM | POA: Diagnosis not present

## 2019-02-24 DIAGNOSIS — K2950 Unspecified chronic gastritis without bleeding: Secondary | ICD-10-CM | POA: Diagnosis not present

## 2019-02-24 DIAGNOSIS — K219 Gastro-esophageal reflux disease without esophagitis: Secondary | ICD-10-CM | POA: Diagnosis not present

## 2019-02-24 DIAGNOSIS — K2100 Gastro-esophageal reflux disease with esophagitis, without bleeding: Secondary | ICD-10-CM | POA: Diagnosis not present

## 2019-02-24 DIAGNOSIS — R002 Palpitations: Secondary | ICD-10-CM

## 2019-02-24 MED ORDER — METOPROLOL SUCCINATE ER 50 MG PO TB24: 50.0000 mg | ORAL_TABLET | Freq: Every day | ORAL | 3 refills | Status: DC

## 2019-02-24 MED ORDER — AMIODARONE HCL 200 MG PO TABS: 200.0000 mg | ORAL_TABLET | Freq: Every day | ORAL | 3 refills | Status: DC

## 2019-02-24 NOTE — Telephone Encounter (Signed)
Pt's medications were sent to pt's pharmacy as requested. I called [facility] and they stated to send the Rx to them. I also put on the Rx to refill with [facility]. Confirmation received.

## 2019-02-24 NOTE — Progress Notes (Signed)
Acute Office Visit  Subjective:    Patient ID: Carmen Cooper, female    DOB: 18-Oct-1940, 79 y.o.   MRN: [id number]  Chief Complaint  Patient presents with  . Chest Pain    HPI Carmen Cooper is a pleasant 79 year old female here today with her son for evaluation for palpitations, light headedness, fatigue, nausea, weakness, mild chest pain, and shortness of breath on exertion. In 12/2018 she was found to be in new onset Afib and was cardioverted 02/12/2019. She has continually felt poorly since the cardioversion.   On 02/17/2019 seen for UTI symptoms. Blood work revealed anemia. She was referred to GI and had an upper endoscopy this morning (02/24/2019) to evaluate for blood loss. She reportedly did not come out of anesthesia well and she continues to feel bad.   She reports eating very little due to decreased appetite. She reports frequent waking throughout the night to urinate. She reports her blood glucose levels are not well controlled despite an insulin pump.    Past Medical History:  Diagnosis Date  . BCC (basal cell carcinoma of skin)   . Diabetes ([place])   . Glaucoma   . History of TIA (transient ischemic attack) 08/11/2013   12/2012 - [name]   . Hypertension   . Hypothyroidism 08/11/2013  . Microscopic colitis 08/21/2013   2008 [facility] [name].  Normal colonoscopy 2009 repeat as routine in 2019   . Thyroid disease   . 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, [facility]     Past Surgical History:  Procedure Laterality Date  . CARDIOVERSION N/A 02/12/2019   Procedure: CARDIOVERSION;  Surgeon: [name], MD;  Location: [facility] ENDOSCOPY;  Service: Cardiovascular;  Laterality: N/A;  . MOHS SURGERY  2019   Nose bcc     Family History  Problem Relation Age of Onset  . Heart disease Son   . Diabetes Neg Hx     Social History   Socioeconomic History  . Marital status: Married    Spouse name: Rachel Bo  . Number of children: 1  . Years of education:  21  . Highest education level: 12th grade  Occupational History  . Occupation: Hair dresser    Comment: retired  Tobacco Use  . Smoking status: Current Every Day Smoker    Packs/day: 0.25    Years: 20.00    Pack years: 5.00  . Smokeless tobacco: Never Used  Substance and Sexual Activity  . Alcohol use: Yes    Alcohol/week: 2.0 standard drinks    Types: 2 Shots of liquor per week    Comment: 2-3 a day  . Drug use: No  . Sexual activity: Not Currently  Other Topics Concern  . Not on file  Social History Narrative   Patient takes care of her husband who has dementia. Doesn't get out much. Drinks 2-3 cups of hot tea daily   Social Determinants of Health   Financial Resource Strain:   . Difficulty of Paying Living Expenses: Not on file  Food Insecurity:   . Worried About Charity fundraiser in the Last Year: Not on file  . Ran Out of Food in the Last Year: Not on file  Transportation Needs:   . Lack of Transportation (Medical): Not on file  . Lack of Transportation (Non-Medical): Not on file  Physical Activity:   . Days of Exercise per Week: Not on file  . Minutes of Exercise per Session: Not on file  Stress:   . Feeling of Stress : Not on file  Social Connections:   . Frequency of Communication with Friends and Family: Not on file  . Frequency of Social Gatherings with Friends and Family: Not on file  . Attends Religious Services: Not on file  . Active Member of Clubs or Organizations: Not on file  . Attends Archivist Meetings: Not on file  . Marital Status: Not on file  Intimate Partner Violence:   . Fear of Current or Ex-Partner: Not on file  . Emotionally Abused: Not on file  . Physically Abused: Not on file  . Sexually Abused: Not on file    Outpatient Medications Prior to Visit  Medication Sig Dispense Refill  . AMBULATORY NON FORMULARY MEDICATION Freestyle light test strips Test twice a day  Dx type 2 diabetes E11.9 100 each 11  . amiodarone  (PACERONE) 200 MG tablet Take 1 tablet (200 mg total) by mouth daily. 90 tablet 3  . amLODipine (NORVASC) 5 MG tablet Take 1 tablet (5 mg total) by mouth daily. 90 tablet 1  . apixaban (ELIQUIS) 5 MG TABS tablet Take 1 tablet (5 mg total) by mouth 2 (two) times daily. CONTACT CARDIOLOGY FOR FUTURE REFILLS 60 tablet 0  . atorvastatin (LIPITOR) 40 MG tablet Take 1 tablet (40 mg total) by mouth daily. 90 tablet 3  . Dorzolamide HCl-Timolol Mal PF 22.3-6.8 MG/ML SOLN Place 1 drop into both eyes 2 (two) times daily.     . famotidine (PEPCID) 20 MG tablet Take 1 tablet (20 mg total) by mouth 2 (two) times daily. 60 tablet 1  . folic acid (FOLVITE) 1 MG tablet Take 1 mg by mouth daily.     Marland Kitchen FREESTYLE LITE test strip     . hydrochlorothiazide (HYDRODIURIL) 12.5 MG tablet TAKE 1 TABLET DAILY 90 tablet 3  . Insulin Human (INSULIN PUMP) SOLN Inject into the skin. insulin lispro (HUMALOG) 100 UNIT/ML    . insulin lispro (HUMALOG) 100 UNIT/ML injection Medtronic 630G pump.  Basal 12-6a 0.625, 6a-7p 0.725, 7p-12a 0.625.  Preset bolus:  4/5/6.  ISF 50.  Total daily dose:  40 units/day    . levothyroxine (SYNTHROID, LEVOTHROID) 112 MCG tablet Take 1 tablet (112 mcg total) by mouth daily. 90 tablet 3  . lisinopril (ZESTRIL) 20 MG tablet Take 1 tablet (20 mg total) by mouth daily. 90 tablet 3  . methotrexate (RHEUMATREX) 2.5 MG tablet Take 25 mg by mouth every Monday.     . metoprolol succinate (TOPROL-XL) 50 MG 24 hr tablet Take 1 tablet (50 mg total) by mouth daily. Take with or immediately following a meal. 90 tablet 3  . nepafenac (NEVANAC) 0.1 % ophthalmic suspension Place 1 drop into both eyes 2 (two) times daily.     . Omega-3 1000 MG CAPS Take 1,000 mg by mouth daily.     Marland Kitchen ZIOPTAN 0.0015 % SOLN Place 1 drop into both eyes at bedtime.     Marland Kitchen amoxicillin-clavulanate (AUGMENTIN) 500-125 MG tablet Take 1 tablet (500 mg total) by mouth 2 (two) times daily. For 10 days. (Patient not taking: Reported on 02/24/2019)  20 tablet 0   No facility-administered medications prior to visit.    Allergies  Allergen Reactions  . Dexamethasone Anaphylaxis and Other (See Comments)    Blood sugar elevated    . Brimonidine Tartrate Other (See Comments)    Burning and redness Burning and redness Burning and redness Burning and redness   . Clindamycin/Lincomycin Rash  .  Sulfa Antibiotics Rash  . Valacyclovir Hcl Rash    Review of Systems  Constitutional: Positive for activity change, appetite change and fatigue. Negative for fever.  Respiratory: Positive for shortness of breath. Negative for cough and chest tightness.   Cardiovascular: Positive for chest pain and palpitations. Negative for leg swelling.  Gastrointestinal: Negative for abdominal pain, anal bleeding, blood in stool, constipation, diarrhea, nausea and vomiting.  Genitourinary: Positive for decreased urine volume and frequency. Negative for difficulty urinating, dysuria, hematuria, pelvic pain and urgency.  Neurological: Positive for dizziness, weakness and light-headedness. Negative for syncope.  Psychiatric/Behavioral: Positive for sleep disturbance.       Objective:    Physical Exam Vitals and nursing note reviewed.  Constitutional:      Appearance: She is ill-appearing.  Cardiovascular:     Rate and Rhythm: Regular rhythm. Bradycardia present.     Chest Wall: PMI is not displaced.     Heart sounds: Normal heart sounds.  Pulmonary:     Effort: Pulmonary effort is normal. No accessory muscle usage.     Breath sounds: Normal breath sounds.  Abdominal:     Palpations: Abdomen is soft.  Musculoskeletal:     Right lower leg: No edema.     Left lower leg: No edema.  Skin:    General: Skin is warm and dry.     Capillary Refill: Capillary refill takes less than 2 seconds.  Neurological:     Mental Status: She is alert.  Psychiatric:        Mood and Affect: Mood normal.        Behavior: Behavior normal.     BP (!) 153/73    Pulse 60   Temp 98 F (36.7 C) (Oral)   Ht 5\' 5"  (1.651 m)   Wt 124 lb 1.9 oz (56.3 kg)   SpO2 98%   BMI 20.65 kg/m  Wt Readings from Last 3 Encounters:  02/24/19 124 lb 1.9 oz (56.3 kg)  02/17/19 127 lb (57.6 kg)  02/12/19 127 lb (57.6 kg)    There are no preventive care reminders to display for this patient.  There are no preventive care reminders to display for this patient.   Lab Results  Component Value Date   TSH 3.87 01/03/2019   Lab Results  Component Value Date   WBC 4.7 02/17/2019   HGB 10.0 (L) 02/17/2019   HCT 29.1 (L) 02/17/2019   MCV 109.0 (H) 02/17/2019   PLT 163 02/17/2019   Lab Results  Component Value Date   NA 140 02/17/2019   K 4.1 02/17/2019   CO2 27 02/17/2019   GLUCOSE 92 02/17/2019   BUN 17 02/17/2019   CREATININE 1.05 (H) 02/17/2019   BILITOT 1.0 01/03/2019   ALKPHOS 143 (A) 11/29/2018   AST 30 01/03/2019   ALT 26 01/03/2019   PROT 6.8 01/03/2019   ALBUMIN 3.8 12/06/2015   CALCIUM 8.6 02/17/2019   Lab Results  Component Value Date   CHOL 96 03/27/2017   Lab Results  Component Value Date   HDL 57 03/27/2017   Lab Results  Component Value Date   LDLCALC 28 03/27/2017   Lab Results  Component Value Date   TRIG 75 03/27/2017   Lab Results  Component Value Date   CHOLHDL 1.8 08/11/2013   Lab Results  Component Value Date   HGBA1C 8.8 11/28/2018       Assessment & Plan:   1. Heart palpitations EKG and labs today. EKG results below.  -  EKG 12-Lead - B12 and Folate Panel - Fe+TIBC+Fer  ECG REPORT  Date: 02/24/2019   Rate: 54  Rhythm: sinus bradycardia  QRS Axis: normal  Intervals: normal  ST/T Wave abnormalities: normal  Conduction Disutrbances:none  Narrative Interpretation: Sinus bradycardia, Otherwise normal EKG  Old EKG Reviewed: Pt no longer in Afib- Cardioversion 12/30  I have personally reviewed the EKG tracing and agree with the computerized printout as noted.  2. Anemia, unspecified type Labs today  to further evaluate anemia.  - B12 and Folate Panel - Fe+TIBC+Fer  3. Drug-induced folate deficiency anemia  Pt on methotrexate with history of folate deficiency. Labs today to determine anemia.  - B12 and Folate Panel  4. Fatigue, unspecified type EKG and labs today. Thyroid checked 12/2018. Metoprolol dose increased 01/2019 to 50mg /day- will reduce to 25mg /day and f/u in 2 weeks to see if symptoms are improved.   Return in about 2 weeks (around 03/10/2019) for anemia and metoprolol change.   Orma Render, NP

## 2019-02-24 NOTE — Patient Instructions (Signed)
Change the way you take your Metoprolol.   You will take 25mg  (1/2 of a pill) of Metoprolol every morning with breakfast.  We will do blood work today.

## 2019-02-25 ENCOUNTER — Telehealth: Payer: Self-pay

## 2019-02-25 LAB — IRON,TIBC AND FERRITIN PANEL
%SAT: 18 % (calc) (ref 16–45)
Ferritin: 532 ng/mL — ABNORMAL HIGH (ref 16–288)
Iron: 50 ug/dL (ref 45–160)
TIBC: 274 mcg/dL (calc) (ref 250–450)

## 2019-02-25 LAB — B12 AND FOLATE PANEL
Folate: 14.4 ng/mL
Vitamin B-12: 932 pg/mL (ref 200–1100)

## 2019-02-25 NOTE — Telephone Encounter (Signed)
**Note De-Identified  Obfuscation** Per the pts son Darryl's request I did a prior authorization on the pts Eliquis. PA done through covermymeds and the following message was received:   Reina Fuse Key: [id number]  Outcome  Drug is covered by current benefit plan. No further PA activity needed  DrugEliquis 5MG  tablets  [employer] Electronic PA Form  I have notified Darryl that a PA is not needed and he verbalized understanding.

## 2019-02-26 DIAGNOSIS — Z8673 Personal history of transient ischemic attack (TIA), and cerebral infarction without residual deficits: Secondary | ICD-10-CM | POA: Diagnosis not present

## 2019-02-26 DIAGNOSIS — Z20822 Contact with and (suspected) exposure to covid-19: Secondary | ICD-10-CM | POA: Diagnosis not present

## 2019-02-26 DIAGNOSIS — N3000 Acute cystitis without hematuria: Secondary | ICD-10-CM | POA: Diagnosis not present

## 2019-02-26 DIAGNOSIS — Z888 Allergy status to other drugs, medicaments and biological substances status: Secondary | ICD-10-CM | POA: Diagnosis not present

## 2019-02-26 DIAGNOSIS — Z79899 Other long term (current) drug therapy: Secondary | ICD-10-CM | POA: Diagnosis not present

## 2019-02-26 DIAGNOSIS — Z87891 Personal history of nicotine dependence: Secondary | ICD-10-CM | POA: Diagnosis not present

## 2019-02-26 DIAGNOSIS — E1165 Type 2 diabetes mellitus with hyperglycemia: Secondary | ICD-10-CM | POA: Diagnosis not present

## 2019-02-26 DIAGNOSIS — E1065 Type 1 diabetes mellitus with hyperglycemia: Secondary | ICD-10-CM | POA: Diagnosis not present

## 2019-02-26 DIAGNOSIS — I252 Old myocardial infarction: Secondary | ICD-10-CM | POA: Diagnosis not present

## 2019-02-26 DIAGNOSIS — R079 Chest pain, unspecified: Secondary | ICD-10-CM | POA: Diagnosis not present

## 2019-02-26 DIAGNOSIS — E039 Hypothyroidism, unspecified: Secondary | ICD-10-CM | POA: Diagnosis not present

## 2019-02-26 DIAGNOSIS — Z794 Long term (current) use of insulin: Secondary | ICD-10-CM | POA: Diagnosis not present

## 2019-02-26 DIAGNOSIS — E785 Hyperlipidemia, unspecified: Secondary | ICD-10-CM | POA: Diagnosis not present

## 2019-02-26 DIAGNOSIS — Z882 Allergy status to sulfonamides status: Secondary | ICD-10-CM | POA: Diagnosis not present

## 2019-02-27 ENCOUNTER — Telehealth: Payer: Self-pay

## 2019-02-27 MED ORDER — GENERIC EXTERNAL MEDICATION
5.00 | Status: DC
Start: ? — End: 2019-02-27

## 2019-02-27 NOTE — Telephone Encounter (Signed)
Pt's son called regarding an emergency visit his mother had yesterday. As per Darryl, around 930 pm, pt's blood sugar was extremely elevated and would not go down. Pt was kept overnight at [facility] ED. Diagnosis was hyperglycemia acute cystitis w/o hematuria. Pt has been given a course of Keflex. Darryl was informed to keep pt's provider informed of update. He did mentioned that pt recently saw NP Emeterio Reeve. He will call and make an appt if pt does not get better with Keflex antibiotic.

## 2019-03-06 ENCOUNTER — Telehealth: Payer: Self-pay | Admitting: Osteopathic Medicine

## 2019-03-06 NOTE — Telephone Encounter (Signed)
Patient states that she had labs done last week and haven't heard any results yet. Please Advise.

## 2019-03-10 ENCOUNTER — Other Ambulatory Visit: Payer: Self-pay | Admitting: Cardiology

## 2019-03-10 ENCOUNTER — Telehealth: Payer: Self-pay | Admitting: Osteopathic Medicine

## 2019-03-10 DIAGNOSIS — R0789 Other chest pain: Secondary | ICD-10-CM

## 2019-03-10 DIAGNOSIS — I4891 Unspecified atrial fibrillation: Secondary | ICD-10-CM

## 2019-03-10 MED ORDER — APIXABAN 5 MG PO TABS: 5.0000 mg | ORAL_TABLET | Freq: Two times a day (BID) | ORAL | 10 refills | Status: DC

## 2019-03-10 NOTE — Telephone Encounter (Signed)
Refills should be done through Cardiology. Patient advised and will call Cardiology.

## 2019-03-10 NOTE — Telephone Encounter (Signed)
PT needs a refill on a medication. Last day of medication   apixaban (ELIQUIS) 5 MG TABS tablet [id number]

## 2019-03-10 NOTE — Telephone Encounter (Signed)
*  STAT* If patient is at the pharmacy, call can be transferred to refill team.   1. Which medications need to be refilled? (please list name of each medication and dose if known)  Eliqius  2. Which pharmacy/location (including street and city if local pharmacy) is medication to be sent to? [place]  3. Do they need a 30 day or 90 day supply? 60 and refills

## 2019-03-11 ENCOUNTER — Ambulatory Visit (INDEPENDENT_AMBULATORY_CARE_PROVIDER_SITE_OTHER): Payer: Medicare Other | Admitting: Osteopathic Medicine

## 2019-03-11 ENCOUNTER — Encounter: Payer: Self-pay | Admitting: Osteopathic Medicine

## 2019-03-11 VITALS — BP 131/76 | HR 69 | Temp 98.5°F | Wt 118.0 lb

## 2019-03-11 DIAGNOSIS — R21 Rash and other nonspecific skin eruption: Secondary | ICD-10-CM | POA: Diagnosis not present

## 2019-03-11 DIAGNOSIS — R634 Abnormal weight loss: Secondary | ICD-10-CM | POA: Diagnosis not present

## 2019-03-11 DIAGNOSIS — E1165 Type 2 diabetes mellitus with hyperglycemia: Secondary | ICD-10-CM

## 2019-03-11 DIAGNOSIS — E1149 Type 2 diabetes mellitus with other diabetic neurological complication: Secondary | ICD-10-CM | POA: Diagnosis not present

## 2019-03-11 DIAGNOSIS — R5383 Other fatigue: Secondary | ICD-10-CM

## 2019-03-11 DIAGNOSIS — IMO0002 Reserved for concepts with insufficient information to code with codable children: Secondary | ICD-10-CM

## 2019-03-11 NOTE — Patient Instructions (Signed)
   Will get back in w/ Dr Hartford Poli - I'll call him today  Call Dr Cindee Salt re: biopsy results   If headache worse / not better w/ increased water intake - let me know in 2-3 days!

## 2019-03-11 NOTE — Progress Notes (Addendum)
Carmen Cooper is a 79 y.o. female who presents to  [place] at [facility]  today, 03/12/19, seeking care for the following:  The primary encounter diagnosis was Other fatigue. Diagnoses of Uncontrolled diabetes mellitus with neurologic complication ([place]), Weight loss, and Rash and nonspecific skin eruption were also pertinent to this visit.   Patient is accompanied to the office today with her son.  I have not seen her since 2016 but she has had a few visits recently with other providers in my office.    She and her son have concerns about fatigue and weight loss worsening since her cardioversion in December.  Occasional dyspnea on exertion, no orthopnea.  No claudication.  No chest pain.    Concern for headache, ongoing for about 3 to 4 weeks intermittently, dull, no associated vision changes, jaw claudication, temporal tenderness, focal weakness, speech abnormalities.  Wt Readings from Last 3 Encounters:  03/11/19 118 lb (53.5 kg)  02/24/19 124 lb 1.9 oz (56.3 kg)  02/17/19 127 lb (57.6 kg)     ASSESSMENT & PLAN with other pertinent history/findings:  1. Other fatigue 2. Uncontrolled diabetes mellitus with neurologic complication ([place]) 3. Weight loss 4. Polyuria   Given significantly uncontrolled sugars, I think she needs to follow-up with endocrinology as poorly controlled diabetes can certainly be predisposing her to weight loss, other metabolic abnormalities.    Records reviewed from recent ER visit and labs obtained by my colleagues here in this office.  There is no evidence for true UTI, I think polyuria most likely due to hyperglycemia.  Elevated ferritin most likely inflammatory related due to poorly controlled diabetes.  Patient also drinks very little fluids, I feel headache is very likely related to dehydration, advised increase fluid intake throughout the day, may pursue MRI versus neurologic referral if headache is not improved  over the next few days.    Patient son will reach out to her endocrinologist later today, they had canceled an appointment with him today to come and see me  5. Rash and nonspecific skin eruption Complains of itching rash on her back, meds sent as below   Addendum 03/21/19 8:21 AM  Son is still concerned for "declining health" patient is having ringing in her ears, mental "fuzziness."  Occasional headache.  We will go ahead and order MRI, consider neurology referral.  They do have an upcoming appointment with endocrinology, thankfully.   No orders of the defined types were placed in this encounter.   Meds ordered this encounter  Medications  . clotrimazole-betamethasone (LOTRISONE) cream    Sig: Apply 1 application topically 2 (two) times daily. To affected area on back. If no improvement in 1-2 weeks please call PCP    Dispense:  45 g    Refill:  0    Patient Instructions   Will get back in w/ Dr Hartford Poli - I'll call him today  Call Dr Cindee Salt re: biopsy results   If headache worse / not better w/ increased water intake - let me know in 2-3 days!       Follow-up instructions: Return in about 2 weeks (around 03/25/2019) for recheck fatigue / headache. Call me sooner if needed! .       BP 131/76 (BP Location: Left Arm, Patient Position: Sitting, Cuff Size: Normal)   Pulse 69   Temp 98.5 F (36.9 C) (Oral)   Wt 118 lb (53.5 kg)   BMI 19.64 kg/m   Current Meds  Medication Sig  . AMBULATORY NON FORMULARY MEDICATION Freestyle light test strips Test twice a day  Dx type 2 diabetes E11.9  . amiodarone (PACERONE) 200 MG tablet Take 1 tablet (200 mg total) by mouth daily.  Marland Kitchen amLODipine (NORVASC) 5 MG tablet Take 1 tablet (5 mg total) by mouth daily.  Marland Kitchen apixaban (ELIQUIS) 5 MG TABS tablet Take 1 tablet (5 mg total) by mouth 2 (two) times daily. CONTACT CARDIOLOGY FOR FUTURE REFILLS  . atorvastatin (LIPITOR) 40 MG tablet Take 1 tablet (40 mg total) by mouth daily.  . Dorzolamide  HCl-Timolol Mal PF 22.3-6.8 MG/ML SOLN Place 1 drop into both eyes 2 (two) times daily.   . famotidine (PEPCID) 20 MG tablet Take 1 tablet (20 mg total) by mouth 2 (two) times daily.  . folic acid (FOLVITE) 1 MG tablet Take 1 mg by mouth daily.   Marland Kitchen FREESTYLE LITE test strip   . hydrochlorothiazide (HYDRODIURIL) 12.5 MG tablet TAKE 1 TABLET DAILY  . Insulin Human (INSULIN PUMP) SOLN Inject into the skin. insulin lispro (HUMALOG) 100 UNIT/ML  . insulin lispro (HUMALOG) 100 UNIT/ML injection Medtronic 630G pump.  Basal 12-6a 0.625, 6a-7p 0.725, 7p-12a 0.625.  Preset bolus:  4/5/6.  ISF 50.  Total daily dose:  40 units/day  . levothyroxine (SYNTHROID, LEVOTHROID) 112 MCG tablet Take 1 tablet (112 mcg total) by mouth daily.  Marland Kitchen lisinopril (ZESTRIL) 20 MG tablet Take 1 tablet (20 mg total) by mouth daily.  . methotrexate (RHEUMATREX) 2.5 MG tablet Take 25 mg by mouth every Monday.   . metoprolol succinate (TOPROL-XL) 50 MG 24 hr tablet Take 1 tablet (50 mg total) by mouth daily. Take with or immediately following a meal.  . nepafenac (NEVANAC) 0.1 % ophthalmic suspension Place 1 drop into both eyes 2 (two) times daily.   . Omega-3 1000 MG CAPS Take 1,000 mg by mouth daily.   Marland Kitchen ZIOPTAN 0.0015 % SOLN Place 1 drop into both eyes at bedtime.     No results found for this or any previous visit (from the past 72 hour(s)).  No results found.  Depression screen [facility] 2/9 01/02/2019 04/03/2018 02/12/2018  Decreased Interest 0 3 1  Down, Depressed, Hopeless 0 2 1  PHQ - 2 Score 0 5 2  Altered sleeping - 3 0  Tired, decreased energy - 0 0  Change in appetite - 0 0  Feeling bad or failure about yourself  - 3 3  Trouble concentrating - 1 0  Moving slowly or fidgety/restless - 0 0  Suicidal thoughts - 1 1  PHQ-9 Score - 13 6  Difficult doing work/chores - Not difficult at all Somewhat difficult    GAD 7 : Generalized Anxiety Score 04/03/2018 06/19/2017  Nervous, Anxious, on Edge 1 1  Control/stop worrying  3 3  Worry too much - different things 3 3  Trouble relaxing 3 2  Restless 3 3  Easily annoyed or irritable 3 3  Afraid - awful might happen 3 3  Total GAD 7 Score 19 18  Anxiety Difficulty Not difficult at all Very difficult      All questions at time of visit were answered - patient instructed to contact office with any additional concerns or updates.  ER/RTC precautions were reviewed with the patient.  Please note: voice recognition software was used to produce this document, and typos may escape review. Please contact Dr. Sheppard Coil for any needed clarifications.   Total encounter time: 40 minutes.

## 2019-03-12 MED ORDER — CLOTRIMAZOLE-BETAMETHASONE 1-0.05 % EX CREA: 1.0000 "application " | TOPICAL_CREAM | Freq: Two times a day (BID) | CUTANEOUS | 0 refills | Status: DC

## 2019-03-13 DIAGNOSIS — E1110 Type 2 diabetes mellitus with ketoacidosis without coma: Secondary | ICD-10-CM | POA: Diagnosis not present

## 2019-03-13 DIAGNOSIS — E1069 Type 1 diabetes mellitus with other specified complication: Secondary | ICD-10-CM | POA: Diagnosis not present

## 2019-03-13 DIAGNOSIS — Z794 Long term (current) use of insulin: Secondary | ICD-10-CM | POA: Diagnosis not present

## 2019-03-13 DIAGNOSIS — E1065 Type 1 diabetes mellitus with hyperglycemia: Secondary | ICD-10-CM | POA: Diagnosis not present

## 2019-03-13 DIAGNOSIS — E119 Type 2 diabetes mellitus without complications: Secondary | ICD-10-CM | POA: Diagnosis not present

## 2019-03-13 DIAGNOSIS — E1049 Type 1 diabetes mellitus with other diabetic neurological complication: Secondary | ICD-10-CM | POA: Diagnosis not present

## 2019-03-13 DIAGNOSIS — E1021 Type 1 diabetes mellitus with diabetic nephropathy: Secondary | ICD-10-CM | POA: Diagnosis not present

## 2019-03-13 DIAGNOSIS — E785 Hyperlipidemia, unspecified: Secondary | ICD-10-CM | POA: Diagnosis not present

## 2019-03-13 DIAGNOSIS — E039 Hypothyroidism, unspecified: Secondary | ICD-10-CM | POA: Diagnosis not present

## 2019-03-17 ENCOUNTER — Telehealth: Payer: Self-pay

## 2019-03-17 DIAGNOSIS — I152 Hypertension secondary to endocrine disorders: Secondary | ICD-10-CM

## 2019-03-17 DIAGNOSIS — E1159 Type 2 diabetes mellitus with other circulatory complications: Secondary | ICD-10-CM

## 2019-03-17 DIAGNOSIS — IMO0002 Reserved for concepts with insufficient information to code with codable children: Secondary | ICD-10-CM

## 2019-03-17 DIAGNOSIS — E1139 Type 2 diabetes mellitus with other diabetic ophthalmic complication: Secondary | ICD-10-CM

## 2019-03-17 NOTE — Telephone Encounter (Signed)
Pt's son called stating that pt continues to be unwell. Still having issues with some dizziness. Requesting feed back from provider on what else can be done for pt. He mentioned that pt was given abx a couple of weeks ago. Pt was doing better after taking abx but symptoms started again.Pls advise, thanks.

## 2019-03-17 NOTE — Telephone Encounter (Signed)
Has she followed up yet with Dr Hartford Poli? My best guess is still that she is having problems due to uncontrolled sugars Can recheck labs and go from there, orders are in, that's about all I can suggest for now.

## 2019-03-18 ENCOUNTER — Ambulatory Visit: Payer: Medicare Other | Admitting: Family Medicine

## 2019-03-18 ENCOUNTER — Ambulatory Visit: Payer: Medicare Other | Admitting: Osteopathic Medicine

## 2019-03-18 NOTE — Telephone Encounter (Signed)
Spoke to pt's son Darryl, pt has an appt with Dr. Hartford Poli on 03/31/19. This was the soonest appt that pt was able to get schedule. Darryl will see whether pt wants to repeat labs or wait until her appt with Dr. Hartford Poli. He will contact clinic with updates as needed. No other inquiries during call.

## 2019-03-20 ENCOUNTER — Telehealth: Payer: Self-pay

## 2019-03-20 DIAGNOSIS — G4489 Other headache syndrome: Secondary | ICD-10-CM

## 2019-03-20 NOTE — Telephone Encounter (Signed)
Darryl left a vm msg on behalf of pt. As per Darryl, pt was complaining last night of nosebleeds, sounds in her ears and having "fuzziness". Requesting whether pt should go ahead and have an MRI completed. Darryl stated that pt's health continues to decline. She has an appt with Dr. Hartford Poli on 03/31/19. Pls advise, thanks.

## 2019-03-21 NOTE — Telephone Encounter (Signed)
Please call patient's son, not sure an MRI will show [place] anything if ear ringing and mental fogginess are her only symptoms and the headache has resolved, but I went ahead and order the MRI.  She needs to keep appointment with endocrinology.  If there is concern for serious changes in her mental status, she needs evaluation right away in the ER

## 2019-03-21 NOTE — Telephone Encounter (Signed)
Pt's son Reita Cliche has been updated regarding provider's note. Agreeable with provider's recommendation. No other inquiries during call.

## 2019-03-24 ENCOUNTER — Ambulatory Visit (INDEPENDENT_AMBULATORY_CARE_PROVIDER_SITE_OTHER): Payer: Medicare Other

## 2019-03-24 ENCOUNTER — Other Ambulatory Visit: Payer: Self-pay

## 2019-03-24 DIAGNOSIS — G4489 Other headache syndrome: Secondary | ICD-10-CM | POA: Diagnosis not present

## 2019-03-24 DIAGNOSIS — R519 Headache, unspecified: Secondary | ICD-10-CM | POA: Diagnosis not present

## 2019-03-24 MED ORDER — GADOBUTROL 1 MMOL/ML IV SOLN
5.5000 mL | Freq: Once | INTRAVENOUS | Status: AC | PRN
  Administered 2019-03-24: 7.5 mL via INTRAVENOUS

## 2019-03-25 ENCOUNTER — Encounter: Payer: Self-pay | Admitting: Osteopathic Medicine

## 2019-03-25 ENCOUNTER — Ambulatory Visit (INDEPENDENT_AMBULATORY_CARE_PROVIDER_SITE_OTHER): Payer: Medicare Other | Admitting: Osteopathic Medicine

## 2019-03-25 VITALS — BP 137/64 | HR 68 | Temp 97.5°F | Ht 65.0 in | Wt 118.0 lb

## 2019-03-25 DIAGNOSIS — I10 Essential (primary) hypertension: Secondary | ICD-10-CM | POA: Diagnosis not present

## 2019-03-25 DIAGNOSIS — E1159 Type 2 diabetes mellitus with other circulatory complications: Secondary | ICD-10-CM | POA: Diagnosis not present

## 2019-03-25 DIAGNOSIS — G44229 Chronic tension-type headache, not intractable: Secondary | ICD-10-CM | POA: Diagnosis not present

## 2019-03-25 DIAGNOSIS — E1139 Type 2 diabetes mellitus with other diabetic ophthalmic complication: Secondary | ICD-10-CM | POA: Diagnosis not present

## 2019-03-25 DIAGNOSIS — E1165 Type 2 diabetes mellitus with hyperglycemia: Secondary | ICD-10-CM | POA: Diagnosis not present

## 2019-03-25 DIAGNOSIS — G3184 Mild cognitive impairment, so stated: Secondary | ICD-10-CM | POA: Diagnosis not present

## 2019-03-25 DIAGNOSIS — R5382 Chronic fatigue, unspecified: Secondary | ICD-10-CM | POA: Diagnosis not present

## 2019-03-25 NOTE — Progress Notes (Signed)
Carmen Cooper is a 79 y.o. female who presents to  [place] at [facility]  today, 03/25/19, seeking care for the following:  The primary encounter diagnosis was Chronic fatigue. Diagnoses of Mild cognitive impairment and Chronic tension-type headache, not intractable were also pertinent to this visit.   ASSESSMENT & PLAN with other pertinent history/findings:  1. Chronic fatigue 2. Mild cognitive impairment 3. Chronic tension-type headache, not intractable  Suspect multifactorial Hyperglycemia - needs follow up w/ endocrine, evaluating today for ketosis complications.  OK to hold metoprolol, discussed risks of this, pt reports issues since starting few new meds after her cardioversion. Would follow up with cariology depending on endocrine recs.  MRI negative other then expected chronic small vessel changes, will refer to neurology for 2nd opinion (may benefit form dementia evaluation, I suspect mild cognitive impairment)         Orders Placed This Encounter  Procedures  . Ambulatory referral to Neurology    No orders of the defined types were placed in this encounter.   Patient Instructions  MRI did not show any reason for symptoms Will repeat labs.   Please keep appointment with Endocrinologist! I'm hoping things will improve with better sugar control. In the meantime, if Carmen Cooper experiences a severe change in mental status, she needs evaluation in the ER.        Follow-up instructions: Return if symptoms worsen or fail to improve.             BP 137/64   Pulse 68   Temp (!) 97.5 F (36.4 C) (Oral)   Ht 5\' 5"  (1.651 m)   Wt 118 lb (53.5 kg)   BMI 19.64 kg/m   Current Meds  Medication Sig  . AMBULATORY NON FORMULARY MEDICATION Freestyle light test strips Test twice a day  Dx type 2 diabetes E11.9  . amiodarone (PACERONE) 200 MG tablet Take 1 tablet (200 mg total) by mouth daily.  Marland Kitchen amLODipine  (NORVASC) 5 MG tablet Take 1 tablet (5 mg total) by mouth daily.  Marland Kitchen apixaban (ELIQUIS) 5 MG TABS tablet Take 1 tablet (5 mg total) by mouth 2 (two) times daily. CONTACT CARDIOLOGY FOR FUTURE REFILLS  . atorvastatin (LIPITOR) 40 MG tablet Take 1 tablet (40 mg total) by mouth daily.  . clotrimazole-betamethasone (LOTRISONE) cream Apply 1 application topically 2 (two) times daily. To affected area on back. If no improvement in 1-2 weeks please call PCP  . Dorzolamide HCl-Timolol Mal PF 22.3-6.8 MG/ML SOLN Place 1 drop into both eyes 2 (two) times daily.   . famotidine (PEPCID) 20 MG tablet Take 1 tablet (20 mg total) by mouth 2 (two) times daily.  . folic acid (FOLVITE) 1 MG tablet Take 1 mg by mouth daily.   Marland Kitchen FREESTYLE LITE test strip   . hydrochlorothiazide (HYDRODIURIL) 12.5 MG tablet TAKE 1 TABLET DAILY  . Insulin Human (INSULIN PUMP) SOLN Inject into the skin. insulin lispro (HUMALOG) 100 UNIT/ML  . insulin lispro (HUMALOG) 100 UNIT/ML injection Medtronic 630G pump.  Basal 12-6a 0.625, 6a-7p 0.725, 7p-12a 0.625.  Preset bolus:  4/5/6.  ISF 50.  Total daily dose:  40 units/day  . levothyroxine (SYNTHROID, LEVOTHROID) 112 MCG tablet Take 1 tablet (112 mcg total) by mouth daily.  Marland Kitchen lisinopril (ZESTRIL) 20 MG tablet Take 1 tablet (20 mg total) by mouth daily.  . methotrexate (RHEUMATREX) 2.5 MG tablet Take 25 mg by mouth every Monday.   . metoprolol succinate (TOPROL-XL) 50  MG 24 hr tablet Take 1 tablet (50 mg total) by mouth daily. Take with or immediately following a meal.  . nepafenac (NEVANAC) 0.1 % ophthalmic suspension Place 1 drop into both eyes 2 (two) times daily.   . Omega-3 1000 MG CAPS Take 1,000 mg by mouth daily.   Marland Kitchen ZIOPTAN 0.0015 % SOLN Place 1 drop into both eyes at bedtime.     No results found for this or any previous visit (from the past 72 hour(s)).  MR Brain W Wo Contrast  Result Date: 03/25/2019 CLINICAL DATA:  Chronic headaches EXAM: MRI HEAD WITHOUT AND WITH CONTRAST  TECHNIQUE: Multiplanar, multiecho pulse sequences of the brain and surrounding structures were obtained without and with intravenous contrast. CONTRAST:  7.92mL GADAVIST GADOBUTROL 1 MMOL/ML IV SOLN COMPARISON:  None. FINDINGS: BRAIN: No acute infarct, acute hemorrhage or extra-axial collection. Early confluent hyperintense T2-weighted signal of the periventricular and deep white matter, most commonly due to chronic ischemic microangiopathy. Normal volume of brain parenchyma and CSF spaces. Midline structures are normal. VASCULAR: Major flow voids are preserved. Susceptibility-sensitive sequences show no chronic microhemorrhage or superficial siderosis. SKULL AND UPPER CERVICAL SPINE: Normal calvarium and skull base. Visualized upper cervical spine and soft tissues are normal. SINUSES/ORBITS: No fluid levels or advanced mucosal thickening. No mastoid or middle ear effusion. Normal orbits. IMPRESSION: 1. No acute intracranial abnormality. 2. Findings of chronic small vessel ischemia. Electronically Signed   By: Ulyses Jarred M.D.   On: 03/25/2019 00:44    Depression screen [facility] 2/9 01/02/2019 04/03/2018 02/12/2018  Decreased Interest 0 3 1  Down, Depressed, Hopeless 0 2 1  PHQ - 2 Score 0 5 2  Altered sleeping - 3 0  Tired, decreased energy - 0 0  Change in appetite - 0 0  Feeling bad or failure about yourself  - 3 3  Trouble concentrating - 1 0  Moving slowly or fidgety/restless - 0 0  Suicidal thoughts - 1 1  PHQ-9 Score - 13 6  Difficult doing work/chores - Not difficult at all Somewhat difficult    GAD 7 : Generalized Anxiety Score 04/03/2018 06/19/2017  Nervous, Anxious, on Edge 1 1  Control/stop worrying 3 3  Worry too much - different things 3 3  Trouble relaxing 3 2  Restless 3 3  Easily annoyed or irritable 3 3  Afraid - awful might happen 3 3  Total GAD 7 Score 19 18  Anxiety Difficulty Not difficult at all Very difficult      All questions at time of visit were answered - patient  instructed to contact office with any additional concerns or updates.  ER/RTC precautions were reviewed with the patient.  Please note: voice recognition software was used to produce this document, and typos may escape review. Please contact Dr. Sheppard Coil for any needed clarifications.

## 2019-03-25 NOTE — Patient Instructions (Signed)
MRI did not show any reason for symptoms Will repeat labs.   Please keep appointment with Endocrinologist! I'm hoping things will improve with better sugar control. In the meantime, if Carmen Cooper experiences a severe change in mental status, she needs evaluation in the ER.

## 2019-03-26 LAB — URINALYSIS, ROUTINE W REFLEX MICROSCOPIC
Bilirubin Urine: NEGATIVE
Glucose, UA: NEGATIVE
Hgb urine dipstick: NEGATIVE
Hyaline Cast: NONE SEEN /LPF
Ketones, ur: NEGATIVE
Nitrite: NEGATIVE
Protein, ur: NEGATIVE
Specific Gravity, Urine: 1.02 (ref 1.001–1.03)
pH: 6.5 (ref 5.0–8.0)

## 2019-03-26 LAB — TSH: TSH: 9.93 mIU/L — ABNORMAL HIGH (ref 0.40–4.50)

## 2019-03-26 LAB — COMPLETE METABOLIC PANEL WITH GFR
AG Ratio: 1.4 (calc) (ref 1.0–2.5)
ALT: 51 U/L — ABNORMAL HIGH (ref 6–29)
AST: 68 U/L — ABNORMAL HIGH (ref 10–35)
Albumin: 3.4 g/dL — ABNORMAL LOW (ref 3.6–5.1)
Alkaline phosphatase (APISO): 82 U/L (ref 37–153)
BUN/Creatinine Ratio: 19 (calc) (ref 6–22)
BUN: 21 mg/dL (ref 7–25)
CO2: 32 mmol/L (ref 20–32)
Calcium: 8.8 mg/dL (ref 8.6–10.4)
Chloride: 101 mmol/L (ref 98–110)
Creat: 1.09 mg/dL — ABNORMAL HIGH (ref 0.60–0.93)
GFR, Est African American: 56 mL/min/{1.73_m2} — ABNORMAL LOW (ref >=60)
GFR, Est Non African American: 48 mL/min/{1.73_m2} — ABNORMAL LOW (ref >=60)
Globulin: 2.5 g/dL (calc) (ref 1.9–3.7)
Glucose, Bld: 178 mg/dL — ABNORMAL HIGH (ref 65–139)
Potassium: 4.7 mmol/L (ref 3.5–5.3)
Sodium: 138 mmol/L (ref 135–146)
Total Bilirubin: 0.9 mg/dL (ref 0.2–1.2)
Total Protein: 5.9 g/dL — ABNORMAL LOW (ref 6.1–8.1)

## 2019-03-26 LAB — CBC
HCT: 31.6 % — ABNORMAL LOW (ref 35.0–45.0)
Hemoglobin: 11.1 g/dL — ABNORMAL LOW (ref 11.7–15.5)
MCH: 40.7 pg — ABNORMAL HIGH (ref 27.0–33.0)
MCHC: 35.1 g/dL (ref 32.0–36.0)
MCV: 115.8 fL — ABNORMAL HIGH (ref 80.0–100.0)
MPV: 11.4 fL (ref 7.5–12.5)
Platelets: 273 10*3/uL (ref 140–400)
RBC: 2.73 10*6/uL — ABNORMAL LOW (ref 3.80–5.10)
RDW: 15.5 % — ABNORMAL HIGH (ref 11.0–15.0)
WBC: 4.3 10*3/uL (ref 3.8–10.8)

## 2019-03-26 LAB — MICROSCOPIC MESSAGE

## 2019-03-26 LAB — HEMOGLOBIN A1C
Hgb A1c MFr Bld: 9.4 % of total Hgb — ABNORMAL HIGH (ref <5.7)
Mean Plasma Glucose: 223 (calc)
eAG (mmol/L): 12.4 (calc)

## 2019-03-26 LAB — MAGNESIUM: Magnesium: 1.6 mg/dL (ref 1.5–2.5)

## 2019-03-28 ENCOUNTER — Other Ambulatory Visit: Payer: Self-pay | Admitting: Cardiology

## 2019-03-28 DIAGNOSIS — I4891 Unspecified atrial fibrillation: Secondary | ICD-10-CM

## 2019-03-28 DIAGNOSIS — R0789 Other chest pain: Secondary | ICD-10-CM

## 2019-03-28 MED ORDER — APIXABAN 5 MG PO TABS: 5.0000 mg | ORAL_TABLET | Freq: Two times a day (BID) | ORAL | 1 refills | Status: DC

## 2019-03-28 NOTE — Telephone Encounter (Signed)
Pt last saw Dr Curt Bears 01/21/19, last labs 03/25/19 Creat 1.09, age 79, weight 53.5kg, based on specified criteria pt is on appropriate dosage of Eliquis 5mg  BID.  Will refill rx.

## 2019-03-28 NOTE — Telephone Encounter (Signed)
Pt needed medication sent to [facility] with a 90 day supply. Please address

## 2019-03-31 ENCOUNTER — Other Ambulatory Visit: Payer: Self-pay

## 2019-03-31 DIAGNOSIS — E1069 Type 1 diabetes mellitus with other specified complication: Secondary | ICD-10-CM | POA: Diagnosis not present

## 2019-03-31 DIAGNOSIS — E1065 Type 1 diabetes mellitus with hyperglycemia: Secondary | ICD-10-CM | POA: Diagnosis not present

## 2019-03-31 DIAGNOSIS — E1021 Type 1 diabetes mellitus with diabetic nephropathy: Secondary | ICD-10-CM | POA: Diagnosis not present

## 2019-03-31 DIAGNOSIS — R0789 Other chest pain: Secondary | ICD-10-CM

## 2019-03-31 DIAGNOSIS — I4891 Unspecified atrial fibrillation: Secondary | ICD-10-CM

## 2019-03-31 DIAGNOSIS — K130 Diseases of lips: Secondary | ICD-10-CM | POA: Diagnosis not present

## 2019-03-31 DIAGNOSIS — I480 Paroxysmal atrial fibrillation: Secondary | ICD-10-CM | POA: Diagnosis not present

## 2019-03-31 DIAGNOSIS — E039 Hypothyroidism, unspecified: Secondary | ICD-10-CM | POA: Diagnosis not present

## 2019-03-31 DIAGNOSIS — E785 Hyperlipidemia, unspecified: Secondary | ICD-10-CM | POA: Diagnosis not present

## 2019-03-31 MED ORDER — APIXABAN 5 MG PO TABS: 5.0000 mg | ORAL_TABLET | Freq: Two times a day (BID) | ORAL | 1 refills | Status: DC

## 2019-04-01 ENCOUNTER — Other Ambulatory Visit: Payer: Self-pay

## 2019-04-01 ENCOUNTER — Ambulatory Visit (INDEPENDENT_AMBULATORY_CARE_PROVIDER_SITE_OTHER): Payer: Medicare Other | Admitting: Neurology

## 2019-04-01 ENCOUNTER — Encounter: Payer: Self-pay | Admitting: Neurology

## 2019-04-01 VITALS — BP 142/70 | HR 66 | Temp 97.3°F | Ht 65.0 in | Wt 120.5 lb

## 2019-04-01 DIAGNOSIS — G4700 Insomnia, unspecified: Secondary | ICD-10-CM

## 2019-04-01 DIAGNOSIS — F488 Other specified nonpsychotic mental disorders: Secondary | ICD-10-CM | POA: Diagnosis not present

## 2019-04-01 DIAGNOSIS — L989 Disorder of the skin and subcutaneous tissue, unspecified: Secondary | ICD-10-CM | POA: Diagnosis not present

## 2019-04-01 DIAGNOSIS — L578 Other skin changes due to chronic exposure to nonionizing radiation: Secondary | ICD-10-CM | POA: Diagnosis not present

## 2019-04-01 DIAGNOSIS — L570 Actinic keratosis: Secondary | ICD-10-CM | POA: Diagnosis not present

## 2019-04-01 DIAGNOSIS — K130 Diseases of lips: Secondary | ICD-10-CM | POA: Diagnosis not present

## 2019-04-01 DIAGNOSIS — R4189 Other symptoms and signs involving cognitive functions and awareness: Secondary | ICD-10-CM

## 2019-04-01 HISTORY — PX: OTHER SURGICAL HISTORY: SHX169

## 2019-04-01 MED ORDER — TRAZODONE HCL 50 MG PO TABS: 100.0000 mg | ORAL_TABLET | Freq: Every day | ORAL | 11 refills | Status: DC

## 2019-04-01 NOTE — Addendum Note (Signed)
Addended by: Marcial Pacas on: 04/01/2019 04:42 PM   Modules accepted: Level of Service

## 2019-04-01 NOTE — Progress Notes (Signed)
PATIENT: Carmen Cooper DOB: 11-Jan-1941  Chief Complaint  Patient presents with  . Memory Loss/Head Pain    MMSE 29/30 - 10 animals. She is here with her son, Carmen Cooper. Reports memory difficulty over the last six months. She is also concerned about intermittent, sharp shooting pains in her head. She is experiencing generalized weakness.   . PCP    Emeterio Reeve, DO     HISTORICAL  Carmen Cooper is a 79 year old female, seen in request by her primary care physician Dr. Emeterio Reeve for evaluation of memory loss, right parietal area pain, he is accompanied by her son Carmen Cooper at today's clinical visit on April 01, 2019.  I have reviewed and summarized the referring note from the referring physician.  She has past medical history of hypertension, hyperlipidemia, diabetes, insulin-dependent, atrial fibrillation, on Eliquis 5 mg twice a day, glaucoma, history of TIA, uveitis colitis.  She reported a lot of stress, in summer 2020, she and her husband of 30 year old moved to a small condo, she is the main caregiver of her husband, which has caused a lot of stress, she complains of difficulty sleeping  She has poorly controlled insulin-dependent diabetes, now on insulin pump, but on February 26, 2019, she presented to outside emergency rooms for generalized weakness, fatigue, glucose was out of the register range of her home glucometer, Recent laboratory evaluation also noted significantly elevated TSH, 9.93 on February 9; 13.9 on February 15, despite she has been compliant with her thyroid supplement,  She complains of brain foggy sensation, especially when she get up from seated position, but is still present even at sitting down position, I do not demonstrate orthostatic blood pressure changes today, sitting down blood pressure 155/77, heart rate of 72, standing up 160/80 heart rate of 74, standing up for 3 minutes, 156/83, heart rate of 73,  In addition, she complains of intermittent  sharp transient pain to her right occipital region, lasting less than 1 minute, multiple episode in a day,   I personally reviewed MRI of the brain with without contrast, no acute abnormality, chronic small vessel disease Laboratory evaluations in February 2021, A1c 9.4, TSH was elevated 9.93, CMP showed mild elevated creatinine 1.09, CBC hemoglobin of 11.1, MCV was elevated 115.8, normal B12 932, ferritin was elevated 532,   REVIEW OF SYSTEMS: Full 14 system review of systems performed and notable only for as above All other review of systems were negative.  ALLERGIES: Allergies  Allergen Reactions  . Dexamethasone Anaphylaxis and Other (See Comments)    Blood sugar elevated    . Brimonidine Tartrate Other (See Comments)    Burning and redness   . Clindamycin/Lincomycin Rash  . Sulfa Antibiotics Rash  . Valacyclovir Hcl Rash    HOME MEDICATIONS: Current Outpatient Medications  Medication Sig Dispense Refill  . AMBULATORY NON FORMULARY MEDICATION Freestyle light test strips Test twice a day  Dx type 2 diabetes E11.9 100 each 11  . amiodarone (PACERONE) 200 MG tablet Take 1 tablet (200 mg total) by mouth daily. 90 tablet 3  . amLODipine (NORVASC) 5 MG tablet Take 1 tablet (5 mg total) by mouth daily. 90 tablet 1  . apixaban (ELIQUIS) 5 MG TABS tablet Take 1 tablet (5 mg total) by mouth 2 (two) times daily. 180 tablet 1  . atorvastatin (LIPITOR) 40 MG tablet Take 1 tablet (40 mg total) by mouth daily. 90 tablet 3  . clotrimazole-betamethasone (LOTRISONE) cream Apply 1 application topically 2 (two) times daily. To affected  area on back. If no improvement in 1-2 weeks please call PCP 45 g 0  . Dorzolamide HCl-Timolol Mal PF 22.3-6.8 MG/ML SOLN Place 1 drop into both eyes 2 (two) times daily.     . famotidine (PEPCID) 20 MG tablet Take 1 tablet (20 mg total) by mouth 2 (two) times daily. 60 tablet 1  . folic acid (FOLVITE) 1 MG tablet Take 1 mg by mouth daily.     Marland Kitchen FREESTYLE LITE  test strip     . hydrochlorothiazide (HYDRODIURIL) 12.5 MG tablet TAKE 1 TABLET DAILY 90 tablet 3  . Insulin Human (INSULIN PUMP) SOLN Inject into the skin. insulin lispro (HUMALOG) 100 UNIT/ML    . insulin lispro (HUMALOG) 100 UNIT/ML injection Medtronic 630G pump.  Basal 12-6a 0.625, 6a-7p 0.725, 7p-12a 0.625.  Preset bolus:  4/5/6.  ISF 50.  Total daily dose:  40 units/day    . levothyroxine (SYNTHROID, LEVOTHROID) 112 MCG tablet Take 1 tablet (112 mcg total) by mouth daily. 90 tablet 3  . lisinopril (ZESTRIL) 20 MG tablet Take 1 tablet (20 mg total) by mouth daily. 90 tablet 3  . methotrexate (RHEUMATREX) 2.5 MG tablet Take 25 mg by mouth every Monday.     . metoprolol succinate (TOPROL-XL) 50 MG 24 hr tablet Take 1 tablet (50 mg total) by mouth daily. Take with or immediately following a meal. 90 tablet 3  . nepafenac (NEVANAC) 0.1 % ophthalmic suspension Place 1 drop into both eyes 2 (two) times daily.     . Omega-3 1000 MG CAPS Take 1,000 mg by mouth daily.     Marland Kitchen ZIOPTAN 0.0015 % SOLN Place 1 drop into both eyes at bedtime.      No current facility-administered medications for this visit.    PAST MEDICAL HISTORY: Past Medical History:  Diagnosis Date  . Atrial fibrillation ([place])   . BCC (basal cell carcinoma of skin)   . Diabetes ([place])   . Glaucoma   . History of TIA (transient ischemic attack) 08/11/2013   12/2012 - [name]   . Hypertension   . Hypothyroidism 08/11/2013  . Memory changes   . Microscopic colitis 08/21/2013   2008 [facility] [name].  Normal colonoscopy 2009 repeat as routine in 2019   . Thyroid disease   . 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, [facility]     PAST SURGICAL HISTORY: Past Surgical History:  Procedure Laterality Date  . CARDIOVERSION N/A 02/12/2019   Procedure: CARDIOVERSION;  Surgeon: [name], MD;  Location: [facility];  Service: Cardiovascular;  Laterality: N/A;  . MOHS SURGERY  2019   Nose bcc   .  OTHER SURGICAL HISTORY  04/01/2019   biopsy on nose and lip     FAMILY HISTORY: Family History  Problem Relation Age of Onset  . Heart disease Son   . Hypertension Mother   . Cancer Mother        unsure of origin  . Heart attack Father   . Diabetes Neg Hx     SOCIAL HISTORY: Social History   Socioeconomic History  . Marital status: Married    Spouse name: Carmen Cooper  . Number of children: 1  . Years of education: 38  . Highest education level: 12th grade  Occupational History  . Occupation: Hair dresser    Comment: retired  Tobacco Use  . Smoking status: Current Every Day Smoker    Packs/day: 0.25    Years: 20.00  Pack years: 5.00  . Smokeless tobacco: Never Used  Substance and Sexual Activity  . Alcohol use: Yes    Alcohol/week: 2.0 standard drinks    Types: 2 Shots of liquor per week    Comment: 2-3 a day  . Drug use: No  . Sexual activity: Not Currently  Other Topics Concern  . Not on file  Social History Narrative   Patient takes care of her husband who has dementia. Doesn't get out much. Drinks 2-3 cups of hot tea daily.   Right-handed.   Social Determinants of Health   Financial Resource Strain:   . Difficulty of Paying Living Expenses: Not on file  Food Insecurity:   . Worried About Charity fundraiser in the Last Year: Not on file  . Ran Out of Food in the Last Year: Not on file  Transportation Needs:   . Lack of Transportation (Medical): Not on file  . Lack of Transportation (Non-Medical): Not on file  Physical Activity:   . Days of Exercise per Week: Not on file  . Minutes of Exercise per Session: Not on file  Stress:   . Feeling of Stress : Not on file  Social Connections:   . Frequency of Communication with Friends and Family: Not on file  . Frequency of Social Gatherings with Friends and Family: Not on file  . Attends Religious Services: Not on file  . Active Member of Clubs or Organizations: Not on file  . Attends Archivist  Meetings: Not on file  . Marital Status: Not on file  Intimate Partner Violence:   . Fear of Current or Ex-Partner: Not on file  . Emotionally Abused: Not on file  . Physically Abused: Not on file  . Sexually Abused: Not on file     PHYSICAL EXAM   Vitals:   04/01/19 1304  BP: (!) 142/70  Pulse: 66  Temp: (!) 97.3 F (36.3 C)  Weight: 120 lb 8 oz (54.7 kg)  Height: 5\' 5"  (1.651 m)    Not recorded      Body mass index is 20.05 kg/m.  PHYSICAL EXAMNIATION:  Gen: NAD, conversant, well nourised, well groomed                     Cardiovascular: Regular rate rhythm, no peripheral edema, warm, nontender. Eyes: Conjunctivae clear without exudates or hemorrhage Neck: Supple, no carotid bruits. Pulmonary: Clear to auscultation bilaterally   NEUROLOGICAL EXAM:  MENTAL STATUS: Depressed looking elderly female, fragile, speech:    MMSE - Mini Mental State Exam 04/01/2019  Orientation to time 5  Orientation to Place 5  Registration 3  Attention/ Calculation 5  Recall 2  Language- name 2 objects 2  Language- repeat 1  Language- follow 3 step command 3  Language- read & follow direction 1  Write a sentence 1  Copy design 1  Total score 29   Animal naming 10 CRANIAL NERVES: CN II: Visual fields are full to confrontation. Pupils are round equal and briskly reactive to light. CN III, IV, VI: extraocular movement are normal. No ptosis. CN V: Facial sensation is intact to light touch CN VII: Face is symmetric with normal eye closure  CN VIII: Hearing is normal to causal conversation. CN IX, X: Phonation is normal. CN XI: Head turning and shoulder shrug are intact  MOTOR: There is no pronator drift of out-stretched arms. Muscle bulk and tone are normal. Muscle strength is normal.  REFLEXES: Reflexes are  2+ and symmetric at the biceps, triceps, knees, and absent at ankles . Plantar responses are flexor.  SENSORY: Length dependent decreased to light touch light touch,  pinprick and vibratory sensation to ankle level COORDINATION: There is no trunk or limb dysmetria noted.  GAIT/STANCE: Posture is normal. Gait is steady with normal steps, base, arm swing, and turning.   Romberg is absent.   DIAGNOSTIC DATA (LABS, IMAGING, TESTING) - I reviewed patient records, labs, notes, testing and imaging myself where available.   ASSESSMENT AND PLAN  Keoshia Kreiter is a 79 y.o. female   Brain foggy sensation, intermittent sharp pain at right parietal region  MRI of the brain with without contrast showed mild small vessel disease, no acute abnormalities,  Poorly controlled diabetes, elevated TSH, Depression anxiety, chronic insomnia,  We will add on trazodone 50 mg, titrating to 100 mg every night for insomnia,  Her constellation of complaints are most likely related to her poorly controlled diabetes, metabolic syndrome, depression anxiety, insomnia, she is to continue follow-up with her primary care physician     Marcial Pacas, M.D. Ph.D.  [facility] [address], [place], [place] [address] Ph: [phone] Fax: [phone]  CC: Emeterio Reeve, DO

## 2019-04-02 ENCOUNTER — Telehealth: Payer: Self-pay | Admitting: Neurology

## 2019-04-02 LAB — C-REACTIVE PROTEIN: CRP: 33 mg/L — ABNORMAL HIGH (ref 0–10)

## 2019-04-02 LAB — SEDIMENTATION RATE: Sed Rate: 20 mm/hr (ref 0–40)

## 2019-04-02 NOTE — Telephone Encounter (Signed)
I was able to speak to the patient's dgt-in-law on DPR. She verbalized understanding of the lab results and will make a follow with her PCP.

## 2019-04-02 NOTE — Telephone Encounter (Signed)
Laboratory evaluations showed elevated C-reactive protein 33, with normal being less than 10, normal ESR, above findings could indicate a systemic inflammatory process, sometimes patient was given a trial of prednisone treatment, but with her brittle diabetes, will hold off treatment at this point,  I have faxed the laboratory results to her primary care physician Dr.Alexander, Carmen Bal, Carmen Cooper she may consider repeat laboratory evaluation later,

## 2019-04-07 ENCOUNTER — Telehealth: Payer: Self-pay

## 2019-04-07 NOTE — Telephone Encounter (Signed)
This message was sent to Dr. Curt Bears from her sons My Chart account.. her My Chart is still pending:  NEXT OV 04/21/19   Message -----      From:Darryl Glendora Score      Sent:04/07/2019 10:26 AM EST        KC:[id number] Meredith Leeds, MD   Subject:Visit Follow-Up Question  Hi Dr Curt Bears,  I'm writing in regards to my mother Carmen Cooper. Birthdate 06/08/1940.  Her heart has stayed in rhythm, but she still feels very weak and fuzzy headed. Her GP took her off the Metoprolol but nothing changed. She's seen the gastroenterologist and a neurologist along with her regular doctor visits.   Would it be possible to take her off the other medications prescribed. She's brittle diabetic and maybe there is a reaction to all the meds. I maybe grasping at straws but she is spending most of her time in bed and we can't find a cause.  Thanks Darryl [employer]

## 2019-04-08 DIAGNOSIS — K130 Diseases of lips: Secondary | ICD-10-CM | POA: Diagnosis not present

## 2019-04-08 DIAGNOSIS — L989 Disorder of the skin and subcutaneous tissue, unspecified: Secondary | ICD-10-CM | POA: Diagnosis not present

## 2019-04-08 NOTE — Telephone Encounter (Signed)
Followed up with pt's son. Pt has been off Metoprolol for several weeks with no resolution of symptoms.  Son is probably going to take mom to local hospital for evaluation.  She hasn't been feeling well, she is in NSR so he knows it isn't the problem, she isn't eating well.   He will call if they feel anything r/t to heart needs to be addressed. Son appreciated the follow up

## 2019-04-09 DIAGNOSIS — J9601 Acute respiratory failure with hypoxia: Secondary | ICD-10-CM | POA: Diagnosis not present

## 2019-04-09 DIAGNOSIS — Z9641 Presence of insulin pump (external) (internal): Secondary | ICD-10-CM | POA: Diagnosis not present

## 2019-04-09 DIAGNOSIS — Z7902 Long term (current) use of antithrombotics/antiplatelets: Secondary | ICD-10-CM | POA: Diagnosis not present

## 2019-04-09 DIAGNOSIS — E039 Hypothyroidism, unspecified: Secondary | ICD-10-CM | POA: Diagnosis not present

## 2019-04-09 DIAGNOSIS — Z888 Allergy status to other drugs, medicaments and biological substances status: Secondary | ICD-10-CM | POA: Diagnosis not present

## 2019-04-09 DIAGNOSIS — I4891 Unspecified atrial fibrillation: Secondary | ICD-10-CM | POA: Diagnosis not present

## 2019-04-09 DIAGNOSIS — E1065 Type 1 diabetes mellitus with hyperglycemia: Secondary | ICD-10-CM | POA: Diagnosis not present

## 2019-04-09 DIAGNOSIS — I272 Pulmonary hypertension, unspecified: Secondary | ICD-10-CM | POA: Insufficient documentation

## 2019-04-09 DIAGNOSIS — I10 Essential (primary) hypertension: Secondary | ICD-10-CM | POA: Diagnosis not present

## 2019-04-09 DIAGNOSIS — I5023 Acute on chronic systolic (congestive) heart failure: Secondary | ICD-10-CM | POA: Diagnosis not present

## 2019-04-09 DIAGNOSIS — E785 Hyperlipidemia, unspecified: Secondary | ICD-10-CM | POA: Diagnosis not present

## 2019-04-09 DIAGNOSIS — R918 Other nonspecific abnormal finding of lung field: Secondary | ICD-10-CM | POA: Diagnosis not present

## 2019-04-09 DIAGNOSIS — R531 Weakness: Secondary | ICD-10-CM | POA: Diagnosis not present

## 2019-04-09 DIAGNOSIS — E876 Hypokalemia: Secondary | ICD-10-CM | POA: Diagnosis not present

## 2019-04-09 DIAGNOSIS — Z882 Allergy status to sulfonamides status: Secondary | ICD-10-CM | POA: Diagnosis not present

## 2019-04-09 DIAGNOSIS — H409 Unspecified glaucoma: Secondary | ICD-10-CM | POA: Diagnosis not present

## 2019-04-09 DIAGNOSIS — Z8673 Personal history of transient ischemic attack (TIA), and cerebral infarction without residual deficits: Secondary | ICD-10-CM | POA: Diagnosis not present

## 2019-04-09 DIAGNOSIS — I5033 Acute on chronic diastolic (congestive) heart failure: Secondary | ICD-10-CM | POA: Diagnosis not present

## 2019-04-09 DIAGNOSIS — I083 Combined rheumatic disorders of mitral, aortic and tricuspid valves: Secondary | ICD-10-CM | POA: Diagnosis not present

## 2019-04-09 DIAGNOSIS — R0602 Shortness of breath: Secondary | ICD-10-CM | POA: Diagnosis not present

## 2019-04-09 DIAGNOSIS — R5381 Other malaise: Secondary | ICD-10-CM | POA: Diagnosis not present

## 2019-04-09 DIAGNOSIS — Z23 Encounter for immunization: Secondary | ICD-10-CM | POA: Diagnosis not present

## 2019-04-09 DIAGNOSIS — I110 Hypertensive heart disease with heart failure: Secondary | ICD-10-CM | POA: Diagnosis not present

## 2019-04-09 DIAGNOSIS — E1040 Type 1 diabetes mellitus with diabetic neuropathy, unspecified: Secondary | ICD-10-CM | POA: Diagnosis not present

## 2019-04-09 DIAGNOSIS — Z20822 Contact with and (suspected) exposure to covid-19: Secondary | ICD-10-CM | POA: Diagnosis not present

## 2019-04-09 DIAGNOSIS — J189 Pneumonia, unspecified organism: Secondary | ICD-10-CM | POA: Insufficient documentation

## 2019-04-09 DIAGNOSIS — Z881 Allergy status to other antibiotic agents status: Secondary | ICD-10-CM | POA: Diagnosis not present

## 2019-04-09 DIAGNOSIS — R079 Chest pain, unspecified: Secondary | ICD-10-CM | POA: Diagnosis not present

## 2019-04-09 DIAGNOSIS — N3000 Acute cystitis without hematuria: Secondary | ICD-10-CM | POA: Diagnosis not present

## 2019-04-09 DIAGNOSIS — Z87891 Personal history of nicotine dependence: Secondary | ICD-10-CM | POA: Diagnosis not present

## 2019-04-09 DIAGNOSIS — I517 Cardiomegaly: Secondary | ICD-10-CM | POA: Diagnosis not present

## 2019-04-09 DIAGNOSIS — I480 Paroxysmal atrial fibrillation: Secondary | ICD-10-CM | POA: Diagnosis not present

## 2019-04-09 DIAGNOSIS — R9431 Abnormal electrocardiogram [ECG] [EKG]: Secondary | ICD-10-CM | POA: Diagnosis not present

## 2019-04-16 MED ORDER — APIXABAN 5 MG PO TABS
5.00 | ORAL_TABLET | ORAL | Status: DC
Start: 2019-04-15 — End: 2019-04-16

## 2019-04-16 MED ORDER — MAGNESIUM OXIDE 400 MG PO TABS
400.00 | ORAL_TABLET | ORAL | Status: DC
Start: 2019-04-16 — End: 2019-04-16

## 2019-04-16 MED ORDER — HYDROCODONE-ACETAMINOPHEN 5-325 MG PO TABS
1.00 | ORAL_TABLET | ORAL | Status: DC
Start: ? — End: 2019-04-16

## 2019-04-16 MED ORDER — LEVOTHYROXINE SODIUM 125 MCG PO TABS
125.00 | ORAL_TABLET | ORAL | Status: DC
Start: 2019-04-15 — End: 2019-04-16

## 2019-04-16 MED ORDER — INSULIN LISPRO 100 UNIT/ML ~~LOC~~ SOLN
1.00 | SUBCUTANEOUS | Status: DC
Start: 2019-04-15 — End: 2019-04-16

## 2019-04-16 MED ORDER — AMLODIPINE BESYLATE 5 MG PO TABS
5.00 | ORAL_TABLET | ORAL | Status: DC
Start: 2019-04-16 — End: 2019-04-16

## 2019-04-16 MED ORDER — INSULIN LISPRO 100 UNIT/ML ~~LOC~~ SOLN
1.00 | SUBCUTANEOUS | Status: DC
Start: ? — End: 2019-04-16

## 2019-04-16 MED ORDER — SENNOSIDES-DOCUSATE SODIUM 8.6-50 MG PO TABS
1.00 | ORAL_TABLET | ORAL | Status: DC
Start: ? — End: 2019-04-16

## 2019-04-16 MED ORDER — ONDANSETRON HCL 4 MG/2ML IJ SOLN
4.00 | INTRAMUSCULAR | Status: DC
Start: ? — End: 2019-04-16

## 2019-04-16 MED ORDER — PANTOPRAZOLE SODIUM 40 MG PO TBEC
40.00 | DELAYED_RELEASE_TABLET | ORAL | Status: DC
Start: 2019-04-16 — End: 2019-04-16

## 2019-04-16 MED ORDER — HYDROCODONE-ACETAMINOPHEN 10-325 MG PO TABS
1.00 | ORAL_TABLET | ORAL | Status: DC
Start: ? — End: 2019-04-16

## 2019-04-16 MED ORDER — AMIODARONE HCL 200 MG PO TABS
200.00 | ORAL_TABLET | ORAL | Status: DC
Start: 2019-04-16 — End: 2019-04-16

## 2019-04-16 MED ORDER — INSULIN GLARGINE 100 UNIT/ML ~~LOC~~ SOLN
1.00 | SUBCUTANEOUS | Status: DC
Start: 2019-04-15 — End: 2019-04-16

## 2019-04-16 MED ORDER — SODIUM CHLORIDE 0.9 % IV SOLN
10.00 | INTRAVENOUS | Status: DC
Start: ? — End: 2019-04-16

## 2019-04-16 MED ORDER — ATORVASTATIN CALCIUM 40 MG PO TABS
40.00 | ORAL_TABLET | ORAL | Status: DC
Start: 2019-04-15 — End: 2019-04-16

## 2019-04-16 MED ORDER — ALBUTEROL SULFATE (2.5 MG/3ML) 0.083% IN NEBU
2.50 | INHALATION_SOLUTION | RESPIRATORY_TRACT | Status: DC
Start: ? — End: 2019-04-16

## 2019-04-16 MED ORDER — GENERIC EXTERNAL MEDICATION
Status: DC
Start: ? — End: 2019-04-16

## 2019-04-16 MED ORDER — LISINOPRIL 20 MG PO TABS
20.00 | ORAL_TABLET | ORAL | Status: DC
Start: 2019-04-16 — End: 2019-04-16

## 2019-04-16 MED ORDER — NALOXONE HCL 0.4 MG/ML IJ SOLN
0.40 | INTRAMUSCULAR | Status: DC
Start: ? — End: 2019-04-16

## 2019-04-16 MED ORDER — CEFDINIR 300 MG PO CAPS
300.00 | ORAL_CAPSULE | ORAL | Status: DC
Start: 2019-04-15 — End: 2019-04-16

## 2019-04-16 MED ORDER — MAGNESIUM HYDROXIDE 400 MG/5ML PO SUSP
30.00 | ORAL | Status: DC
Start: ? — End: 2019-04-16

## 2019-04-16 MED ORDER — ONDANSETRON 4 MG PO TBDP
4.00 | ORAL_TABLET | ORAL | Status: DC
Start: ? — End: 2019-04-16

## 2019-04-16 MED ORDER — DIPHENHYDRAMINE HCL 25 MG PO CAPS
25.00 | ORAL_CAPSULE | ORAL | Status: DC
Start: ? — End: 2019-04-16

## 2019-04-17 MED ORDER — GENERIC EXTERNAL MEDICATION
Status: DC
Start: ? — End: 2019-04-17

## 2019-04-17 MED ORDER — LISINOPRIL 20 MG PO TABS
20.00 | ORAL_TABLET | ORAL | Status: DC
Start: 2019-04-16 — End: 2019-04-17

## 2019-04-17 MED ORDER — AMLODIPINE BESYLATE 5 MG PO TABS
5.00 | ORAL_TABLET | ORAL | Status: DC
Start: 2019-04-16 — End: 2019-04-17

## 2019-04-17 MED ORDER — PANTOPRAZOLE SODIUM 40 MG PO TBEC
40.00 | DELAYED_RELEASE_TABLET | ORAL | Status: DC
Start: 2019-04-16 — End: 2019-04-17

## 2019-04-17 MED ORDER — HYDROCODONE-ACETAMINOPHEN 5-325 MG PO TABS
1.00 | ORAL_TABLET | ORAL | Status: DC
Start: ? — End: 2019-04-17

## 2019-04-17 MED ORDER — APIXABAN 5 MG PO TABS
5.00 | ORAL_TABLET | ORAL | Status: DC
Start: 2019-04-15 — End: 2019-04-17

## 2019-04-17 MED ORDER — AMIODARONE HCL 200 MG PO TABS
200.00 | ORAL_TABLET | ORAL | Status: DC
Start: 2019-04-16 — End: 2019-04-17

## 2019-04-17 MED ORDER — ATORVASTATIN CALCIUM 40 MG PO TABS
40.00 | ORAL_TABLET | ORAL | Status: DC
Start: 2019-04-15 — End: 2019-04-17

## 2019-04-17 MED ORDER — SENNOSIDES-DOCUSATE SODIUM 8.6-50 MG PO TABS
1.00 | ORAL_TABLET | ORAL | Status: DC
Start: ? — End: 2019-04-17

## 2019-04-17 MED ORDER — HYDROCODONE-ACETAMINOPHEN 10-325 MG PO TABS
1.00 | ORAL_TABLET | ORAL | Status: DC
Start: ? — End: 2019-04-17

## 2019-04-17 MED ORDER — MAGNESIUM HYDROXIDE 400 MG/5ML PO SUSP
30.00 | ORAL | Status: DC
Start: ? — End: 2019-04-17

## 2019-04-18 ENCOUNTER — Encounter: Payer: Self-pay | Admitting: Medical-Surgical

## 2019-04-18 ENCOUNTER — Telehealth (INDEPENDENT_AMBULATORY_CARE_PROVIDER_SITE_OTHER): Payer: Medicare Other | Admitting: Medical-Surgical

## 2019-04-18 DIAGNOSIS — Z09 Encounter for follow-up examination after completed treatment for conditions other than malignant neoplasm: Secondary | ICD-10-CM | POA: Diagnosis not present

## 2019-04-18 DIAGNOSIS — R42 Dizziness and giddiness: Secondary | ICD-10-CM

## 2019-04-18 NOTE — Progress Notes (Signed)
Virtual Visit via Video Note  I connected with Carmen Cooper on 04/18/19 at  4:00 PM EST by a video enabled telemedicine application and verified that I am speaking with the correct person using two identifiers.   I discussed the limitations of evaluation and management by telemedicine and the availability of in person appointments. The patient expressed understanding and agreed to proceed.  Subjective:    CC:  Hospital follow-up  HPI: 79 year old female presenting via [place] video visit accompanied by son.  Was hospitalized from April 09, 2019 to April 15, 2019 at [facility] for bilateral lower lobe pneumonia, acute on chronic systolic heart failure, acute hypoxic respiratory failure, and cystitis without hematuria.  Was treated with IV antibiotics during her hospitalization and switched to oral cefdinir twice daily on discharge.  Is taking her medicines without difficulty.  Able to eat and drink well.  Still complains of mild shortness of breath at rest which worsens with activity.  Most recent echocardiogram while inpatient showed EF 67%.  Reports lightheadedness and states "feels funny" when turning her head, this is not a new symptom but has continued since she was hospitalized without improvement.  Does not monitor blood pressures or pulse oximetry at home.  Denies lower extremity swelling.  Not sleeping well but no orthopnea, reluctant to take trazodone due to multiple episodes of nocturia and worry for possible injury.  Has a cardiology follow-up scheduled for 04/21/2019.   Past medical history, Surgical history, Family history not pertinant except as noted below, Social history, Allergies, and medications have been entered into the medical record, reviewed, and corrections made.   Review of Systems: No fevers, chills, night sweats, weight loss, chest pain.   Objective:    General: Speaking clearly in complete sentences with slightly delayed responses. Respirations even, unlabored.   Alert and oriented x3.  Normal judgment. No apparent acute distress.  Impression and Recommendations:    Hospital follow-up Continue cefdinir 300 mg twice daily for remainder of her prescription.  Increased rest and avoid dehydration.  Advised obtaining a home blood pressure monitor that measures on the upper arm to monitor blood pressures while on several antihypertensives/cardiac medications.  Also advised obtaining a home pulse ox to monitor oxygen levels.  Discussed strict emergency precautions.  Keep follow-up appointment with cardiology as scheduled.  Return if symptoms worsen or fail to improve.  38 minutes of non-face-to-face time was provided during this encounter.  I discussed the assessment and treatment plan with the patient. The patient was provided an opportunity to ask questions and all were answered. The patient agreed with the plan and demonstrated an understanding of the instructions.   The patient was advised to call back or seek an in-person evaluation if the symptoms worsen or if the condition fails to improve as anticipated.   Clearnce Sorrel, DNP, APRN, FNP-BC [facility]

## 2019-04-21 ENCOUNTER — Telehealth (INDEPENDENT_AMBULATORY_CARE_PROVIDER_SITE_OTHER): Payer: Medicare Other | Admitting: Cardiology

## 2019-04-21 VITALS — BP 110/60 | HR 68

## 2019-04-21 DIAGNOSIS — N3000 Acute cystitis without hematuria: Secondary | ICD-10-CM | POA: Diagnosis not present

## 2019-04-21 DIAGNOSIS — I110 Hypertensive heart disease with heart failure: Secondary | ICD-10-CM | POA: Diagnosis not present

## 2019-04-21 DIAGNOSIS — I4891 Unspecified atrial fibrillation: Secondary | ICD-10-CM | POA: Diagnosis not present

## 2019-04-21 DIAGNOSIS — B9689 Other specified bacterial agents as the cause of diseases classified elsewhere: Secondary | ICD-10-CM | POA: Diagnosis not present

## 2019-04-21 DIAGNOSIS — I4819 Other persistent atrial fibrillation: Secondary | ICD-10-CM

## 2019-04-21 DIAGNOSIS — E876 Hypokalemia: Secondary | ICD-10-CM | POA: Diagnosis not present

## 2019-04-21 DIAGNOSIS — I272 Pulmonary hypertension, unspecified: Secondary | ICD-10-CM | POA: Diagnosis not present

## 2019-04-21 DIAGNOSIS — J189 Pneumonia, unspecified organism: Secondary | ICD-10-CM | POA: Diagnosis not present

## 2019-04-21 DIAGNOSIS — E039 Hypothyroidism, unspecified: Secondary | ICD-10-CM | POA: Diagnosis not present

## 2019-04-21 DIAGNOSIS — Z9181 History of falling: Secondary | ICD-10-CM | POA: Diagnosis not present

## 2019-04-21 DIAGNOSIS — I5022 Chronic systolic (congestive) heart failure: Secondary | ICD-10-CM | POA: Diagnosis not present

## 2019-04-21 DIAGNOSIS — E109 Type 1 diabetes mellitus without complications: Secondary | ICD-10-CM | POA: Diagnosis not present

## 2019-04-21 NOTE — Progress Notes (Signed)
Electrophysiology TeleHealth Note   Due to national recommendations of social distancing due to COVID 19, an audio/video telehealth visit is felt to be most appropriate for this patient at this time.  See Epic message for the patient's consent to telehealth for [facility].   Date:  04/21/2019   ID:  Carmen Cooper, DOB Oct 08, 1940, MRN [id number]  Location: patient's home  Provider location: [address] [place]  Evaluation Performed: Follow-up visit  PCP:  Emeterio Reeve, DO  Cardiologist:  No primary care provider on file.  Electrophysiologist:  Dr Curt Bears  Chief Complaint:  Post hospital follow up  History of Present Illness:    Carmen Cooper is a 79 y.o. female who presents via audio/video conferencing for a telehealth visit today.  Since last being seen in our clinic, the patient reports doing very well.  Today, she denies symptoms of palpitations, chest pain, shortness of breath,  lower extremity edema, dizziness, presyncope, or syncope.  The patient is otherwise without complaint today.  The patient denies symptoms of fevers, chills, cough, or new SOB worrisome for COVID 19.  She has a history of hypertension, aortic atherosclerosis, diabetes, and CKD.  She was loaded on amiodarone and had a cardioversion.  She recently had hospitalization for pneumonia, acute on chronic systolic heart failure, and hypoxic respiratory failure, cystitis without hematuria at [facility] February 24 to March 2.  Since being discharged, her shortness of breath has continued.  She feels well at rest, but does have significant shortness of breath with exertion.  She was also seen by home health who found her to be orthostatic.  Her pulse ox showed normal oxygenation.  She continues to feel mildly fuzzy headed and unsteady.  She has been losing weight.  Recent lab tests show an elevated TSH and elevated LFTs.  Post hospital discharge, she has felt weak and fatigued. She felt pretty  well after her discharge. She had orthostatics done by home health found to be orthostatic. Aside from that, she is continuing to work with physical therapy.  Past Medical History:  Diagnosis Date  . Atrial fibrillation ([place])   . BCC (basal cell carcinoma of skin)   . Diabetes ([place])   . Glaucoma   . History of TIA (transient ischemic attack) 08/11/2013   12/2012 - [name]   . Hypertension   . Hypothyroidism 08/11/2013  . Memory changes   . Microscopic colitis 08/21/2013   2008 [facility] [name].  Normal colonoscopy 2009 repeat as routine in 2019   . Thyroid disease   . 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, [facility]     Past Surgical History:  Procedure Laterality Date  . CARDIOVERSION N/A 02/12/2019   Procedure: CARDIOVERSION;  Surgeon: [name], MD;  Location: [place] [facility];  Service: Cardiovascular;  Laterality: N/A;  . MOHS SURGERY  2019   Nose bcc   . OTHER SURGICAL HISTORY  04/01/2019   biopsy on nose and lip     Current Outpatient Medications  Medication Sig Dispense Refill  . AMBULATORY NON FORMULARY MEDICATION Freestyle light test strips Test twice a day  Dx type 2 diabetes E11.9 100 each 11  . amiodarone (PACERONE) 200 MG tablet Take 1 tablet (200 mg total) by mouth daily. 90 tablet 3  . amLODipine (NORVASC) 5 MG tablet Take 1 tablet (5 mg total) by mouth daily. 90 tablet 1  . apixaban (ELIQUIS) 5 MG TABS tablet Take 1 tablet (  5 mg total) by mouth 2 (two) times daily. 180 tablet 1  . atorvastatin (LIPITOR) 40 MG tablet Take 1 tablet (40 mg total) by mouth daily. 90 tablet 3  . cefdinir (OMNICEF) 300 MG capsule Take 300 mg by mouth 2 (two) times daily.    . clotrimazole-betamethasone (LOTRISONE) cream Apply 1 application topically 2 (two) times daily. To affected area on back. If no improvement in 1-2 weeks please call PCP 45 g 0  . Dorzolamide HCl-Timolol Mal PF 22.3-6.8 MG/ML SOLN Place 1 drop into both eyes 2 (two) times daily.       . famotidine (PEPCID) 20 MG tablet Take 1 tablet (20 mg total) by mouth 2 (two) times daily. 60 tablet 1  . folic acid (FOLVITE) 1 MG tablet Take 1 mg by mouth daily.     Marland Kitchen FREESTYLE LITE test strip     . hydrochlorothiazide (HYDRODIURIL) 12.5 MG tablet TAKE 1 TABLET DAILY 90 tablet 3  . Insulin Human (INSULIN PUMP) SOLN Inject into the skin. insulin lispro (HUMALOG) 100 UNIT/ML    . insulin lispro (HUMALOG) 100 UNIT/ML injection Medtronic 630G pump.  Basal 12-6a 0.625, 6a-7p 0.725, 7p-12a 0.625.  Preset bolus:  4/5/6.  ISF 50.  Total daily dose:  40 units/day    . levothyroxine (SYNTHROID, LEVOTHROID) 112 MCG tablet Take 1 tablet (112 mcg total) by mouth daily. 90 tablet 3  . Lifitegrast (XIIDRA) 5 % SOLN Place 1 drop into both eyes daily.    Marland Kitchen lisinopril (ZESTRIL) 20 MG tablet Take 1 tablet (20 mg total) by mouth daily. 90 tablet 3  . methotrexate (RHEUMATREX) 2.5 MG tablet Take 25 mg by mouth every Monday.     . metoprolol succinate (TOPROL-XL) 50 MG 24 hr tablet Take 1 tablet (50 mg total) by mouth daily. Take with or immediately following a meal. 90 tablet 3  . nepafenac (NEVANAC) 0.1 % ophthalmic suspension Place 1 drop into both eyes 2 (two) times daily.     . Omega-3 1000 MG CAPS Take 1,000 mg by mouth daily.     . traZODone (DESYREL) 50 MG tablet Take 2 tablets (100 mg total) by mouth at bedtime. 60 tablet 11  . ZIOPTAN 0.0015 % SOLN Place 1 drop into both eyes at bedtime.      No current facility-administered medications for this visit.    Allergies:   Dexamethasone, Brimonidine tartrate, Clindamycin/lincomycin, Sulfa antibiotics, and Valacyclovir hcl   Social History:  The patient  reports that she has been smoking. She has a 5.00 pack-year smoking history. She has never used smokeless tobacco. She reports current alcohol use of about 2.0 standard drinks of alcohol per week. She reports that she does not use drugs.   Family History:  The patient's  family history includes Cancer  in her mother; Heart attack in her father; Heart disease in her son; Hypertension in her mother.   ROS:  Please see the history of present illness.   All other systems are personally reviewed and negative.    Exam:    Vital Signs:  There were no vitals taken for this visit.  Well appearing, alert and conversant, regular work of breathing,  good skin color Eyes- anicteric, neuro- grossly intact, skin- no apparent rash or lesions or cyanosis, mouth- oral mucosa is pink   Labs/Other Tests and Data Reviewed:    Recent Labs: 01/03/2019: Brain Natriuretic Peptide 367 03/25/2019: ALT 51; BUN 21; Creat 1.09; Hemoglobin 11.1; Magnesium 1.6; Platelets 273; Potassium 4.7; Sodium  138; TSH 9.93   Wt Readings from Last 3 Encounters:  04/01/19 120 lb 8 oz (54.7 kg)  03/25/19 118 lb (53.5 kg)  03/11/19 118 lb (53.5 kg)     Other studies personally reviewed: Additional studies/ records that were reviewed today include: TTE  04/11/19 Review of the above records today demonstrates:   Left Ventricle: Systolic function is normal. EF: 55-60%. . Tricuspid Valve: There is mild regurgitation. The right ventricular  systolic pressure is mildly elevated (37-49 mmHg). . Mitral Valve: There is mild regurgitation.   ASSESSMENT & PLAN:    1.  Persistent atrial fibrillation: We Carmen Cooper continue her Eliquis.  Unfortunately, her LFTs and TSH have gone up.  This could be due to her acute infection, though in the interim we Carmen Cooper plan to hold off on amiodarone.  She may require antiarrhythmics in the future, but for now, no antiarrhythmics.  Carmen Cooper allow amiodarone to washout.  2.  Acute on chronic diastolic heart failure: BNP was elevated during her hospitalization.  Echo done during her hospitalization also showed a normal ejection fraction.  We Carmen Cooper monitor daily weights.  3.  Hypertension: Patient was found to be orthostatic. She is on amlodipine and lisinopril. Carmen Cooper stop amlodipine. If this does not help, may  decrease lisinopril to 10 mg.   COVID 19 screen The patient denies symptoms of COVID 19 at this time.  The importance of social distancing was discussed today.  Follow-up: 3 months  Current medicines are reviewed at length with the patient today.   The patient does not have concerns regarding her medicines.  The following changes were made today: Stop amlodipine  Labs/ tests ordered today include:  No orders of the defined types were placed in this encounter.    Patient Risk:  after full review of this patients clinical status, I feel that they are at moderate risk at this time.  Today, I have spent 12 minutes with the patient with telehealth technology discussing post hospital care.    Signed, Keelynn Furgerson Meredith Leeds, MD  04/21/2019 3:44 PM     [facility] [address] [place] [address] [phone] (office) [phone] (fax)

## 2019-04-22 ENCOUNTER — Telehealth: Payer: Self-pay

## 2019-04-22 NOTE — Telephone Encounter (Signed)
Rcvd call from [facility].   States patient complained of being lightheaded and dizzy. She did have a video visit with Cardiologist on 04/21/19.  Orthostatics: 110/60 sitting; 90/60 standing.   Advised Dr. Sheppard Coil. She advised pt to remain hydrated. Follow Cardiologists instructions and advise. If pt remains dizzy take her to the ER for treatment.

## 2019-04-24 ENCOUNTER — Telehealth: Payer: Self-pay

## 2019-04-24 DIAGNOSIS — E109 Type 1 diabetes mellitus without complications: Secondary | ICD-10-CM | POA: Diagnosis not present

## 2019-04-24 DIAGNOSIS — B9689 Other specified bacterial agents as the cause of diseases classified elsewhere: Secondary | ICD-10-CM | POA: Diagnosis not present

## 2019-04-24 DIAGNOSIS — I5022 Chronic systolic (congestive) heart failure: Secondary | ICD-10-CM | POA: Diagnosis not present

## 2019-04-24 DIAGNOSIS — I110 Hypertensive heart disease with heart failure: Secondary | ICD-10-CM | POA: Diagnosis not present

## 2019-04-24 DIAGNOSIS — J189 Pneumonia, unspecified organism: Secondary | ICD-10-CM | POA: Diagnosis not present

## 2019-04-24 DIAGNOSIS — N3000 Acute cystitis without hematuria: Secondary | ICD-10-CM | POA: Diagnosis not present

## 2019-04-24 NOTE — Telephone Encounter (Signed)
Carmen Cooper with [facility] called requesting verbal order for home health PT for 1 week 1 then 2 week 2 for balance, strength, and gain training.   Home Health orders were placed by hospital  CB# [phone]

## 2019-04-25 ENCOUNTER — Encounter: Payer: Self-pay | Admitting: Osteopathic Medicine

## 2019-04-25 NOTE — Telephone Encounter (Signed)
Agree, thanks

## 2019-04-28 DIAGNOSIS — B9689 Other specified bacterial agents as the cause of diseases classified elsewhere: Secondary | ICD-10-CM | POA: Diagnosis not present

## 2019-04-28 DIAGNOSIS — N3000 Acute cystitis without hematuria: Secondary | ICD-10-CM | POA: Diagnosis not present

## 2019-04-28 DIAGNOSIS — J189 Pneumonia, unspecified organism: Secondary | ICD-10-CM | POA: Diagnosis not present

## 2019-04-28 DIAGNOSIS — E109 Type 1 diabetes mellitus without complications: Secondary | ICD-10-CM | POA: Diagnosis not present

## 2019-04-28 DIAGNOSIS — I5022 Chronic systolic (congestive) heart failure: Secondary | ICD-10-CM | POA: Diagnosis not present

## 2019-04-28 DIAGNOSIS — I110 Hypertensive heart disease with heart failure: Secondary | ICD-10-CM | POA: Diagnosis not present

## 2019-04-28 NOTE — Telephone Encounter (Signed)
HH advised

## 2019-04-30 ENCOUNTER — Telehealth: Payer: Self-pay

## 2019-04-30 DIAGNOSIS — E109 Type 1 diabetes mellitus without complications: Secondary | ICD-10-CM | POA: Diagnosis not present

## 2019-04-30 DIAGNOSIS — I110 Hypertensive heart disease with heart failure: Secondary | ICD-10-CM | POA: Diagnosis not present

## 2019-04-30 DIAGNOSIS — B9689 Other specified bacterial agents as the cause of diseases classified elsewhere: Secondary | ICD-10-CM | POA: Diagnosis not present

## 2019-04-30 DIAGNOSIS — N3000 Acute cystitis without hematuria: Secondary | ICD-10-CM | POA: Diagnosis not present

## 2019-04-30 DIAGNOSIS — I5022 Chronic systolic (congestive) heart failure: Secondary | ICD-10-CM | POA: Diagnosis not present

## 2019-04-30 DIAGNOSIS — J189 Pneumonia, unspecified organism: Secondary | ICD-10-CM | POA: Diagnosis not present

## 2019-04-30 NOTE — Telephone Encounter (Signed)
Sounds good to me. Whatever they need/recommend!

## 2019-04-30 NOTE — Telephone Encounter (Signed)
LVM for Pamala Hurry with [facility] to return call.  Charyl Bigger, CMA

## 2019-04-30 NOTE — Telephone Encounter (Signed)
Barbara with [facility] called for verbal orders for speech therapy. Please advise.

## 2019-05-01 DIAGNOSIS — H401120 Primary open-angle glaucoma, left eye, stage unspecified: Secondary | ICD-10-CM | POA: Diagnosis not present

## 2019-05-01 DIAGNOSIS — H44111 Panuveitis, right eye: Secondary | ICD-10-CM | POA: Diagnosis not present

## 2019-05-01 DIAGNOSIS — H209 Unspecified iridocyclitis: Secondary | ICD-10-CM | POA: Diagnosis not present

## 2019-05-01 DIAGNOSIS — Z961 Presence of intraocular lens: Secondary | ICD-10-CM | POA: Diagnosis not present

## 2019-05-01 DIAGNOSIS — H30031 Focal chorioretinal inflammation, peripheral, right eye: Secondary | ICD-10-CM | POA: Diagnosis not present

## 2019-05-01 DIAGNOSIS — H3581 Retinal edema: Secondary | ICD-10-CM | POA: Diagnosis not present

## 2019-05-01 DIAGNOSIS — Z79899 Other long term (current) drug therapy: Secondary | ICD-10-CM | POA: Diagnosis not present

## 2019-05-01 DIAGNOSIS — H4041X1 Glaucoma secondary to eye inflammation, right eye, mild stage: Secondary | ICD-10-CM | POA: Diagnosis not present

## 2019-05-01 MED ORDER — LISINOPRIL 10 MG PO TABS: 10.0000 mg | ORAL_TABLET | Freq: Every day | ORAL | 3 refills | Status: DC

## 2019-05-05 DIAGNOSIS — N3000 Acute cystitis without hematuria: Secondary | ICD-10-CM | POA: Diagnosis not present

## 2019-05-05 DIAGNOSIS — I5022 Chronic systolic (congestive) heart failure: Secondary | ICD-10-CM | POA: Diagnosis not present

## 2019-05-05 DIAGNOSIS — I110 Hypertensive heart disease with heart failure: Secondary | ICD-10-CM | POA: Diagnosis not present

## 2019-05-05 DIAGNOSIS — B9689 Other specified bacterial agents as the cause of diseases classified elsewhere: Secondary | ICD-10-CM | POA: Diagnosis not present

## 2019-05-05 DIAGNOSIS — E109 Type 1 diabetes mellitus without complications: Secondary | ICD-10-CM | POA: Diagnosis not present

## 2019-05-05 DIAGNOSIS — J189 Pneumonia, unspecified organism: Secondary | ICD-10-CM | POA: Diagnosis not present

## 2019-05-05 NOTE — Telephone Encounter (Signed)
Spoke with Pamala Hurry at [facility], informed to proceed with verbal order for speech therapy. Pamala Hurry will contact the office for further inquiries if needed. Direct call back info provided.

## 2019-05-06 DIAGNOSIS — E109 Type 1 diabetes mellitus without complications: Secondary | ICD-10-CM | POA: Diagnosis not present

## 2019-05-06 DIAGNOSIS — J189 Pneumonia, unspecified organism: Secondary | ICD-10-CM | POA: Diagnosis not present

## 2019-05-06 DIAGNOSIS — I110 Hypertensive heart disease with heart failure: Secondary | ICD-10-CM | POA: Diagnosis not present

## 2019-05-06 DIAGNOSIS — I5022 Chronic systolic (congestive) heart failure: Secondary | ICD-10-CM | POA: Diagnosis not present

## 2019-05-06 DIAGNOSIS — B9689 Other specified bacterial agents as the cause of diseases classified elsewhere: Secondary | ICD-10-CM | POA: Diagnosis not present

## 2019-05-06 DIAGNOSIS — N3000 Acute cystitis without hematuria: Secondary | ICD-10-CM | POA: Diagnosis not present

## 2019-05-07 DIAGNOSIS — E10649 Type 1 diabetes mellitus with hypoglycemia without coma: Secondary | ICD-10-CM | POA: Diagnosis not present

## 2019-05-07 DIAGNOSIS — N183 Chronic kidney disease, stage 3 unspecified: Secondary | ICD-10-CM | POA: Diagnosis not present

## 2019-05-07 DIAGNOSIS — Z9641 Presence of insulin pump (external) (internal): Secondary | ICD-10-CM | POA: Diagnosis not present

## 2019-05-07 DIAGNOSIS — I5022 Chronic systolic (congestive) heart failure: Secondary | ICD-10-CM | POA: Diagnosis not present

## 2019-05-07 DIAGNOSIS — N3000 Acute cystitis without hematuria: Secondary | ICD-10-CM | POA: Diagnosis not present

## 2019-05-07 DIAGNOSIS — E1049 Type 1 diabetes mellitus with other diabetic neurological complication: Secondary | ICD-10-CM | POA: Diagnosis not present

## 2019-05-07 DIAGNOSIS — B9689 Other specified bacterial agents as the cause of diseases classified elsewhere: Secondary | ICD-10-CM | POA: Diagnosis not present

## 2019-05-07 DIAGNOSIS — E1022 Type 1 diabetes mellitus with diabetic chronic kidney disease: Secondary | ICD-10-CM | POA: Diagnosis not present

## 2019-05-07 DIAGNOSIS — I10 Essential (primary) hypertension: Secondary | ICD-10-CM | POA: Diagnosis not present

## 2019-05-07 DIAGNOSIS — I110 Hypertensive heart disease with heart failure: Secondary | ICD-10-CM | POA: Diagnosis not present

## 2019-05-07 DIAGNOSIS — J189 Pneumonia, unspecified organism: Secondary | ICD-10-CM | POA: Diagnosis not present

## 2019-05-07 DIAGNOSIS — E109 Type 1 diabetes mellitus without complications: Secondary | ICD-10-CM | POA: Diagnosis not present

## 2019-05-07 DIAGNOSIS — E1065 Type 1 diabetes mellitus with hyperglycemia: Secondary | ICD-10-CM | POA: Diagnosis not present

## 2019-05-08 DIAGNOSIS — B9689 Other specified bacterial agents as the cause of diseases classified elsewhere: Secondary | ICD-10-CM | POA: Diagnosis not present

## 2019-05-08 DIAGNOSIS — N3000 Acute cystitis without hematuria: Secondary | ICD-10-CM | POA: Diagnosis not present

## 2019-05-08 DIAGNOSIS — J189 Pneumonia, unspecified organism: Secondary | ICD-10-CM | POA: Diagnosis not present

## 2019-05-08 DIAGNOSIS — I110 Hypertensive heart disease with heart failure: Secondary | ICD-10-CM | POA: Diagnosis not present

## 2019-05-08 DIAGNOSIS — E109 Type 1 diabetes mellitus without complications: Secondary | ICD-10-CM | POA: Diagnosis not present

## 2019-05-08 DIAGNOSIS — I5022 Chronic systolic (congestive) heart failure: Secondary | ICD-10-CM | POA: Diagnosis not present

## 2019-05-12 DIAGNOSIS — I110 Hypertensive heart disease with heart failure: Secondary | ICD-10-CM | POA: Diagnosis not present

## 2019-05-12 DIAGNOSIS — I5022 Chronic systolic (congestive) heart failure: Secondary | ICD-10-CM | POA: Diagnosis not present

## 2019-05-12 DIAGNOSIS — B9689 Other specified bacterial agents as the cause of diseases classified elsewhere: Secondary | ICD-10-CM | POA: Diagnosis not present

## 2019-05-12 DIAGNOSIS — J189 Pneumonia, unspecified organism: Secondary | ICD-10-CM | POA: Diagnosis not present

## 2019-05-12 DIAGNOSIS — E109 Type 1 diabetes mellitus without complications: Secondary | ICD-10-CM | POA: Diagnosis not present

## 2019-05-12 DIAGNOSIS — N3000 Acute cystitis without hematuria: Secondary | ICD-10-CM | POA: Diagnosis not present

## 2019-05-13 DIAGNOSIS — I10 Essential (primary) hypertension: Secondary | ICD-10-CM | POA: Diagnosis not present

## 2019-05-13 DIAGNOSIS — E109 Type 1 diabetes mellitus without complications: Secondary | ICD-10-CM | POA: Diagnosis not present

## 2019-05-13 DIAGNOSIS — N183 Chronic kidney disease, stage 3 unspecified: Secondary | ICD-10-CM | POA: Diagnosis not present

## 2019-05-13 DIAGNOSIS — I110 Hypertensive heart disease with heart failure: Secondary | ICD-10-CM | POA: Diagnosis not present

## 2019-05-13 DIAGNOSIS — B9689 Other specified bacterial agents as the cause of diseases classified elsewhere: Secondary | ICD-10-CM | POA: Diagnosis not present

## 2019-05-13 DIAGNOSIS — E1065 Type 1 diabetes mellitus with hyperglycemia: Secondary | ICD-10-CM | POA: Diagnosis not present

## 2019-05-13 DIAGNOSIS — E039 Hypothyroidism, unspecified: Secondary | ICD-10-CM | POA: Diagnosis not present

## 2019-05-13 DIAGNOSIS — E1049 Type 1 diabetes mellitus with other diabetic neurological complication: Secondary | ICD-10-CM | POA: Diagnosis not present

## 2019-05-13 DIAGNOSIS — I5022 Chronic systolic (congestive) heart failure: Secondary | ICD-10-CM | POA: Diagnosis not present

## 2019-05-13 DIAGNOSIS — J189 Pneumonia, unspecified organism: Secondary | ICD-10-CM | POA: Diagnosis not present

## 2019-05-13 DIAGNOSIS — E1022 Type 1 diabetes mellitus with diabetic chronic kidney disease: Secondary | ICD-10-CM | POA: Diagnosis not present

## 2019-05-13 DIAGNOSIS — N3000 Acute cystitis without hematuria: Secondary | ICD-10-CM | POA: Diagnosis not present

## 2019-05-14 DIAGNOSIS — N3000 Acute cystitis without hematuria: Secondary | ICD-10-CM | POA: Diagnosis not present

## 2019-05-14 DIAGNOSIS — J189 Pneumonia, unspecified organism: Secondary | ICD-10-CM | POA: Diagnosis not present

## 2019-05-14 DIAGNOSIS — E109 Type 1 diabetes mellitus without complications: Secondary | ICD-10-CM | POA: Diagnosis not present

## 2019-05-14 DIAGNOSIS — I110 Hypertensive heart disease with heart failure: Secondary | ICD-10-CM | POA: Diagnosis not present

## 2019-05-14 DIAGNOSIS — L121 Cicatricial pemphigoid: Secondary | ICD-10-CM | POA: Diagnosis not present

## 2019-05-14 DIAGNOSIS — B9689 Other specified bacterial agents as the cause of diseases classified elsewhere: Secondary | ICD-10-CM | POA: Diagnosis not present

## 2019-05-14 DIAGNOSIS — I5022 Chronic systolic (congestive) heart failure: Secondary | ICD-10-CM | POA: Diagnosis not present

## 2019-05-21 DIAGNOSIS — E109 Type 1 diabetes mellitus without complications: Secondary | ICD-10-CM | POA: Diagnosis not present

## 2019-05-21 DIAGNOSIS — J189 Pneumonia, unspecified organism: Secondary | ICD-10-CM | POA: Diagnosis not present

## 2019-05-21 DIAGNOSIS — Z9181 History of falling: Secondary | ICD-10-CM | POA: Diagnosis not present

## 2019-05-21 DIAGNOSIS — E039 Hypothyroidism, unspecified: Secondary | ICD-10-CM | POA: Diagnosis not present

## 2019-05-21 DIAGNOSIS — I272 Pulmonary hypertension, unspecified: Secondary | ICD-10-CM | POA: Diagnosis not present

## 2019-05-21 DIAGNOSIS — I5022 Chronic systolic (congestive) heart failure: Secondary | ICD-10-CM | POA: Diagnosis not present

## 2019-05-21 DIAGNOSIS — I110 Hypertensive heart disease with heart failure: Secondary | ICD-10-CM | POA: Diagnosis not present

## 2019-05-21 DIAGNOSIS — N3000 Acute cystitis without hematuria: Secondary | ICD-10-CM | POA: Diagnosis not present

## 2019-05-21 DIAGNOSIS — B9689 Other specified bacterial agents as the cause of diseases classified elsewhere: Secondary | ICD-10-CM | POA: Diagnosis not present

## 2019-05-21 DIAGNOSIS — E876 Hypokalemia: Secondary | ICD-10-CM | POA: Diagnosis not present

## 2019-05-21 DIAGNOSIS — I4891 Unspecified atrial fibrillation: Secondary | ICD-10-CM | POA: Diagnosis not present

## 2019-05-23 ENCOUNTER — Other Ambulatory Visit: Payer: Self-pay | Admitting: Medical-Surgical

## 2019-05-23 DIAGNOSIS — L121 Cicatricial pemphigoid: Secondary | ICD-10-CM | POA: Diagnosis not present

## 2019-05-23 MED ORDER — FAMOTIDINE 20 MG PO TABS: 20.0000 mg | ORAL_TABLET | Freq: Two times a day (BID) | ORAL | 0 refills | Status: DC

## 2019-05-28 DIAGNOSIS — E109 Type 1 diabetes mellitus without complications: Secondary | ICD-10-CM | POA: Diagnosis not present

## 2019-05-28 DIAGNOSIS — I5022 Chronic systolic (congestive) heart failure: Secondary | ICD-10-CM | POA: Diagnosis not present

## 2019-05-28 DIAGNOSIS — B9689 Other specified bacterial agents as the cause of diseases classified elsewhere: Secondary | ICD-10-CM | POA: Diagnosis not present

## 2019-05-28 DIAGNOSIS — I110 Hypertensive heart disease with heart failure: Secondary | ICD-10-CM | POA: Diagnosis not present

## 2019-05-28 DIAGNOSIS — J189 Pneumonia, unspecified organism: Secondary | ICD-10-CM | POA: Diagnosis not present

## 2019-05-28 DIAGNOSIS — N3000 Acute cystitis without hematuria: Secondary | ICD-10-CM | POA: Diagnosis not present

## 2019-06-04 DIAGNOSIS — B9689 Other specified bacterial agents as the cause of diseases classified elsewhere: Secondary | ICD-10-CM | POA: Diagnosis not present

## 2019-06-04 DIAGNOSIS — I110 Hypertensive heart disease with heart failure: Secondary | ICD-10-CM | POA: Diagnosis not present

## 2019-06-04 DIAGNOSIS — N3000 Acute cystitis without hematuria: Secondary | ICD-10-CM | POA: Diagnosis not present

## 2019-06-04 DIAGNOSIS — I5022 Chronic systolic (congestive) heart failure: Secondary | ICD-10-CM | POA: Diagnosis not present

## 2019-06-04 DIAGNOSIS — J189 Pneumonia, unspecified organism: Secondary | ICD-10-CM | POA: Diagnosis not present

## 2019-06-04 DIAGNOSIS — E109 Type 1 diabetes mellitus without complications: Secondary | ICD-10-CM | POA: Diagnosis not present

## 2019-06-26 DIAGNOSIS — E039 Hypothyroidism, unspecified: Secondary | ICD-10-CM | POA: Diagnosis not present

## 2019-06-30 DIAGNOSIS — Z961 Presence of intraocular lens: Secondary | ICD-10-CM | POA: Diagnosis not present

## 2019-06-30 DIAGNOSIS — H209 Unspecified iridocyclitis: Secondary | ICD-10-CM | POA: Diagnosis not present

## 2019-06-30 DIAGNOSIS — H4043X3 Glaucoma secondary to eye inflammation, bilateral, severe stage: Secondary | ICD-10-CM | POA: Diagnosis not present

## 2019-07-02 DIAGNOSIS — E1022 Type 1 diabetes mellitus with diabetic chronic kidney disease: Secondary | ICD-10-CM | POA: Diagnosis not present

## 2019-07-02 DIAGNOSIS — E1049 Type 1 diabetes mellitus with other diabetic neurological complication: Secondary | ICD-10-CM | POA: Diagnosis not present

## 2019-07-02 DIAGNOSIS — N183 Chronic kidney disease, stage 3 unspecified: Secondary | ICD-10-CM | POA: Diagnosis not present

## 2019-07-02 DIAGNOSIS — E1040 Type 1 diabetes mellitus with diabetic neuropathy, unspecified: Secondary | ICD-10-CM | POA: Diagnosis not present

## 2019-07-02 DIAGNOSIS — E1065 Type 1 diabetes mellitus with hyperglycemia: Secondary | ICD-10-CM | POA: Diagnosis not present

## 2019-07-02 DIAGNOSIS — E10649 Type 1 diabetes mellitus with hypoglycemia without coma: Secondary | ICD-10-CM | POA: Diagnosis not present

## 2019-07-02 DIAGNOSIS — Z9641 Presence of insulin pump (external) (internal): Secondary | ICD-10-CM | POA: Diagnosis not present

## 2019-07-08 DIAGNOSIS — H44111 Panuveitis, right eye: Secondary | ICD-10-CM | POA: Diagnosis not present

## 2019-07-08 DIAGNOSIS — Z79899 Other long term (current) drug therapy: Secondary | ICD-10-CM | POA: Diagnosis not present

## 2019-07-08 DIAGNOSIS — H4041X1 Glaucoma secondary to eye inflammation, right eye, mild stage: Secondary | ICD-10-CM | POA: Diagnosis not present

## 2019-07-08 DIAGNOSIS — H30031 Focal chorioretinal inflammation, peripheral, right eye: Secondary | ICD-10-CM | POA: Diagnosis not present

## 2019-07-08 DIAGNOSIS — E1165 Type 2 diabetes mellitus with hyperglycemia: Secondary | ICD-10-CM | POA: Diagnosis not present

## 2019-07-08 DIAGNOSIS — K1379 Other lesions of oral mucosa: Secondary | ICD-10-CM | POA: Diagnosis not present

## 2019-07-08 DIAGNOSIS — Z794 Long term (current) use of insulin: Secondary | ICD-10-CM | POA: Diagnosis not present

## 2019-07-17 DIAGNOSIS — Z79899 Other long term (current) drug therapy: Secondary | ICD-10-CM | POA: Diagnosis not present

## 2019-07-17 DIAGNOSIS — H3581 Retinal edema: Secondary | ICD-10-CM | POA: Diagnosis not present

## 2019-07-17 DIAGNOSIS — H209 Unspecified iridocyclitis: Secondary | ICD-10-CM | POA: Diagnosis not present

## 2019-07-17 DIAGNOSIS — H4041X1 Glaucoma secondary to eye inflammation, right eye, mild stage: Secondary | ICD-10-CM | POA: Diagnosis not present

## 2019-07-17 DIAGNOSIS — H401120 Primary open-angle glaucoma, left eye, stage unspecified: Secondary | ICD-10-CM | POA: Diagnosis not present

## 2019-07-17 DIAGNOSIS — Z961 Presence of intraocular lens: Secondary | ICD-10-CM | POA: Diagnosis not present

## 2019-07-17 DIAGNOSIS — H30031 Focal chorioretinal inflammation, peripheral, right eye: Secondary | ICD-10-CM | POA: Diagnosis not present

## 2019-07-17 DIAGNOSIS — H44111 Panuveitis, right eye: Secondary | ICD-10-CM | POA: Diagnosis not present

## 2019-08-14 DIAGNOSIS — E039 Hypothyroidism, unspecified: Secondary | ICD-10-CM | POA: Diagnosis not present

## 2019-08-19 DIAGNOSIS — E1065 Type 1 diabetes mellitus with hyperglycemia: Secondary | ICD-10-CM | POA: Diagnosis not present

## 2019-08-19 DIAGNOSIS — N183 Chronic kidney disease, stage 3 unspecified: Secondary | ICD-10-CM | POA: Diagnosis not present

## 2019-08-19 DIAGNOSIS — E1040 Type 1 diabetes mellitus with diabetic neuropathy, unspecified: Secondary | ICD-10-CM | POA: Diagnosis not present

## 2019-08-19 DIAGNOSIS — E039 Hypothyroidism, unspecified: Secondary | ICD-10-CM | POA: Diagnosis not present

## 2019-08-19 DIAGNOSIS — E1022 Type 1 diabetes mellitus with diabetic chronic kidney disease: Secondary | ICD-10-CM | POA: Diagnosis not present

## 2019-08-19 DIAGNOSIS — E1049 Type 1 diabetes mellitus with other diabetic neurological complication: Secondary | ICD-10-CM | POA: Diagnosis not present

## 2019-08-31 ENCOUNTER — Other Ambulatory Visit: Payer: Self-pay | Admitting: Medical-Surgical

## 2019-09-03 ENCOUNTER — Encounter: Payer: Self-pay | Admitting: Osteopathic Medicine

## 2019-09-03 ENCOUNTER — Ambulatory Visit (INDEPENDENT_AMBULATORY_CARE_PROVIDER_SITE_OTHER): Payer: Medicare Other | Admitting: Osteopathic Medicine

## 2019-09-03 VITALS — BP 171/83 | HR 65 | Wt 120.0 lb

## 2019-09-03 DIAGNOSIS — E1159 Type 2 diabetes mellitus with other circulatory complications: Secondary | ICD-10-CM

## 2019-09-03 DIAGNOSIS — E1149 Type 2 diabetes mellitus with other diabetic neurological complication: Secondary | ICD-10-CM | POA: Diagnosis not present

## 2019-09-03 DIAGNOSIS — G4489 Other headache syndrome: Secondary | ICD-10-CM | POA: Diagnosis not present

## 2019-09-03 DIAGNOSIS — G44229 Chronic tension-type headache, not intractable: Secondary | ICD-10-CM | POA: Diagnosis not present

## 2019-09-03 DIAGNOSIS — G3184 Mild cognitive impairment, so stated: Secondary | ICD-10-CM

## 2019-09-03 DIAGNOSIS — I10 Essential (primary) hypertension: Secondary | ICD-10-CM

## 2019-09-03 DIAGNOSIS — I152 Hypertension secondary to endocrine disorders: Secondary | ICD-10-CM

## 2019-09-03 DIAGNOSIS — IMO0002 Reserved for concepts with insufficient information to code with codable children: Secondary | ICD-10-CM

## 2019-09-03 DIAGNOSIS — R5383 Other fatigue: Secondary | ICD-10-CM | POA: Diagnosis not present

## 2019-09-03 DIAGNOSIS — E1165 Type 2 diabetes mellitus with hyperglycemia: Secondary | ICD-10-CM | POA: Diagnosis not present

## 2019-09-03 NOTE — Progress Notes (Signed)
HPI: Carmen Cooper is a 79 y.o. female who  has a past medical history of Atrial fibrillation ([place]), BCC (basal cell carcinoma of skin), Diabetes ([place]), Glaucoma, History of TIA (transient ischemic attack) (08/11/2013), Hypertension, Hypothyroidism (08/11/2013), Memory changes, Microscopic colitis (08/21/2013), Thyroid disease, and Uveitic glaucoma (03/20/2014).  she presents to [facility] today, 09/04/19,  for chief complaint of: Dizziness, bump in lower chest  Patient reports she has not felt fully well since October. She states she was hospitalized for pneumonia in january, but the symptoms began in October. Since that time she has felt "fuzzy," stating she feels dizzy on standing, when walking, after taking deep breaths. She also states she feels off balance when walking and gait will zig zag. She states this is accompanied by a tingling and sometimes painful sensation to the back of her head later in the day. She has blurry vision and black spots in her vision, especially when she wakes in the morning. She states she has had to stop driving because of this. She is followed by ophthalmology for glaucoma. She has also been evaluated by neurology for the fuzziness. She had an evaluation including an MRI and labs, and was told there were no neurological issues to be addressed. Pt and son confirm no change over last few months / no change since neurology evaluation.   She states she has been doing well since her husband moved into assisted living. She visits him once per week when her son drives her. She states she has enjoyed the quiet and has a dog at home with her. She has had difficulty sleeping because she has to get up frequently to urinate, but states this is not new. She denies any appetite changes. She states she enjoys gardening and crocheting blankets which are donated to a nursing home.  She also noted a bump in the middle of her lower chest about 6 months ago. It  has not changed in size or shape. She endorses some mild soreness when she presses on it, but otherwise denies associated pain.  HTN: has not been checking BP at home   Patient is accompanied by her son who assists with history-taking.   Past medical, surgical, social and family history reviewed:  Patient Active Problem List   Diagnosis Date Noted   Brain fog 04/01/2019   Atrial fibrillation ([place]) 01/03/2019   CKD stage G3a/A2, GFR 45-59 and albumin creatinine ratio 30-299 mg/g 12/03/2018   Insomnia 09/19/2018   Aortic atherosclerosis ([place]) 04/04/2018   Right knee DJD 06/19/2017   Abdominal cramping 03/06/2016   Fatigue 12/06/2015   Uncontrolled diabetes mellitus with eye complications ([place]) 26/83/4196   Uncontrolled diabetes mellitus with neurologic complication ([place]) 22/29/7989   Uveitis 01/26/2015   Venous stasis dermatitis 08/12/2014   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   Seborrheic keratoses 03/16/2014   Edema 02/10/2014   Dysphagia 02/10/2014   Preventive measure 08/21/2013   H/O left breast biopsy 08/21/2013   Microscopic colitis 08/21/2013   Hyperlipidemia 08/11/2013   History of TIA (transient ischemic attack) 08/11/2013   Hypertension associated with diabetes ([place]) 08/11/2013   Osteopenia 08/11/2013   Hypothyroidism 08/11/2013    Past Surgical History:  Procedure Laterality Date   CARDIOVERSION N/A 02/12/2019   Procedure: CARDIOVERSION;  Surgeon: [name], MD;  Location: [place];  Service: Cardiovascular;  Laterality: N/A;   MOHS SURGERY  2019   Nose bcc    OTHER SURGICAL HISTORY  04/01/2019   biopsy on nose and  lip     Social History   Tobacco Use   Smoking status: Current Every Day Smoker    Packs/day: 0.25    Years: 20.00    Pack years: 5.00   Smokeless tobacco: Never Used  Substance Use Topics   Alcohol use: Yes    Alcohol/week: 2.0 standard drinks    Types: 2 Shots of liquor per week    Comment: 2-3 a day     Family History  Problem Relation Age of Onset   Heart disease Son    Hypertension Mother    Cancer Mother        unsure of origin   Heart attack Father    Diabetes Neg Hx      Current medication list and allergy/intolerance information reviewed:    Current Outpatient Medications  Medication Sig Dispense Refill   AMBULATORY NON FORMULARY MEDICATION Freestyle light test strips Test twice a day  Dx type 2 diabetes E11.9 100 each 11   apixaban (ELIQUIS) 5 MG TABS tablet Take 1 tablet (5 mg total) by mouth 2 (two) times daily. 180 tablet 1   atorvastatin (LIPITOR) 40 MG tablet Take 1 tablet (40 mg total) by mouth daily. 90 tablet 3   cefdinir (OMNICEF) 300 MG capsule Take 300 mg by mouth 2 (two) times daily.     cycloSPORINE modified (NEORAL) 50 MG capsule Take 50 mg by mouth 2 (two) times daily.     Dorzolamide HCl-Timolol Mal PF 22.3-6.8 MG/ML SOLN Place 1 drop into both eyes 2 (two) times daily.      famotidine (PEPCID) 20 MG tablet TAKE 1 TABLET TWICE A DAY 161 tablet 0   folic acid (FOLVITE) 1 MG tablet Take 1 mg by mouth daily.      FREESTYLE LITE test strip      Insulin Human (INSULIN PUMP) SOLN Inject into the skin. insulin lispro (HUMALOG) 100 UNIT/ML     insulin lispro (HUMALOG) 100 UNIT/ML injection Medtronic 630G pump.  Basal 12-6a 0.625, 6a-7p 0.725, 7p-12a 0.625.  Preset bolus:  4/5/6.  ISF 50.  Total daily dose:  40 units/day     Lifitegrast (XIIDRA) 5 % SOLN Place 1 drop into both eyes daily.     lisinopril (ZESTRIL) 10 MG tablet Take 1 tablet (10 mg total) by mouth daily. 90 tablet 3   magnesium oxide (MAG-OX) 400 MG tablet Take 400 mg by mouth daily.     methotrexate (RHEUMATREX) 2.5 MG tablet Take 25 mg by mouth every Monday.      Multiple Vitamins-Minerals (CENTRUM SILVER 50+WOMEN PO) Take 1 tablet by mouth daily.     nepafenac (NEVANAC) 0.1 % ophthalmic suspension Place 1 drop into the right eye 3 (three) times daily.      Omega-3 1000 MG  CAPS Take 1,000 mg by mouth daily.      pantoprazole (PROTONIX) 40 MG tablet Take 40 mg by mouth. Take 1 tablet by mouth 30 min before breakfast     ZIOPTAN 0.0015 % SOLN Place 1 drop into both eyes at bedtime.      amLODipine (NORVASC) 5 MG tablet Take 1 tablet (5 mg total) by mouth daily. (Patient not taking: Reported on 04/24/2019) 90 tablet 1   hydrochlorothiazide (HYDRODIURIL) 12.5 MG tablet TAKE 1 TABLET DAILY (Patient not taking: Reported on 04/24/2019) 90 tablet 3   levothyroxine (SYNTHROID, LEVOTHROID) 112 MCG tablet Take 1 tablet (112 mcg total) by mouth daily. (Patient not taking: Reported on 09/03/2019) 90 tablet 3  No current facility-administered medications for this visit.    Allergies  Allergen Reactions   Dexamethasone Anaphylaxis and Other (See Comments)    Blood sugar elevated     Brimonidine Tartrate Other (See Comments)    Burning and redness    Clindamycin/Lincomycin Rash   Sulfa Antibiotics Rash   Valacyclovir Hcl Rash      Review of Systems:  Constitutional:  No  fever, no chills  HEENT: +  headache, + blurry vision, + scotomas  Cardiac: No  chest pain  Respiratory:  No  shortness of breath. No  Cough  Gastrointestinal: No  abdominal pain, No  nausea, No  Vomiting, No diarrhea or constipation   Musculoskeletal: + bump to lower chest. No new myalgia/arthralgia  Skin: No  Rash, No other wounds/concerning lesions  Endocrine: + polyuria. No cold intolerance,  No heat intolerance. No polydipsia/polyphagia   Neurologic: No  weakness, +  dizziness, No  slurred speech/focal weakness/facial droop  Psychiatric: No  concerns with depression, No  concerns with anxiety, No sleep problems, No mood problems  Exam:  BP (!) 171/83 (BP Location: Left Arm, Patient Position: Sitting)    Pulse 65    Wt 120 lb (54.4 kg)    SpO2 100%    BMI 19.97 kg/m   Constitutional: VS see above. General Appearance: alert, well-developed, well-nourished, NAD  Eyes:  Normal lids and conjunctive, non-icteric sclera. Irregular pupils.  Ears, Nose, Mouth, Throat: MMM, Normal external inspection ears/nares/mouth/lips/gums.    Neck: No masses, trachea midline.  Respiratory: Normal respiratory effort. no wheeze, no rhonchi, no rales  Cardiovascular: S1/S2 normal, no murmur, no rub/gallop auscultated. RRR. No lower extremity edema.  Gastrointestinal: Nontender, no masses.  Musculoskeletal: "Bump" indicated by patient consistent w/ normal palpable L lower rib edge, nontender, symmetric w/ R. No other mass appreciated. No erythema or overlying skin change. Gait normal. No clubbing/cyanosis of digits.   Neurological: Dizziness on standing. Able to walk from chair to exam table without difficulty. No tremor. No cranial nerve deficit on limited exam.   Skin: warm, dry, intact. No rash/ulcer. No concerning nevi or subq nodules on limited exam.  Psychiatric: Normal judgment/insight. Normal mood and affect. Oriented x3.   Orthostatic VS negative for orthostatitc hypotension and no sx reproduced w/ those maneuvers    No results found for this or any previous visit (from the past 72 hour(s)).  No results found.   ASSESSMENT/PLAN: The primary encounter diagnosis was Other headache syndrome. Diagnoses of Hypertension associated with diabetes ([place]), Uncontrolled diabetes mellitus with neurologic complication ([place]), Other fatigue, Chronic tension-type headache, not intractable, and Mild cognitive impairment were also pertinent to this visit.   Bump on lower chest- Reassured patient that bump appears to be rib edge on exam. May have mild inflammation around cartilage. Advised patient to use tylenol as needed for discomfort.  BP - pt to monitor at home and call us w/ numbers next week   Dizziness  Neurology evaluation was unremarkable. No longer following patient.  Pt followed by ophthalmology for glaucoma, which could be contributing to patient's scotomas,  blurry vision, and dizziness.  Pt was not orthostatic in office. Encouraged good hydration. Patient does not drive due to vision problems. Reassured patient that condition is currently stable and to be re-evaluated by neurology if condition worsens. Otherwise follow up in 6 months.  No orders of the defined types were placed in this encounter.   No orders of the defined types were placed in this encounter.  There are no Patient Instructions on file for this visit.      Visit summary with medication list and pertinent instructions was printed for patient to review. All questions at time of visit were answered - patient instructed to contact office with any additional concerns or updates. ER/RTC precautions were reviewed with the patient.   Please note: voice recognition software was used to produce this document, and typos may escape review. Please contact Dr. Sheppard Coil for any needed clarifications.   Total time spent 40 minutes    Follow-up plan: Return in about 6 months (around 03/05/2020) for [place], otherwise follow up as recommended by specialists / see me if needed.   Gweneth Dimitri, [facility] MS3

## 2019-09-11 DIAGNOSIS — H209 Unspecified iridocyclitis: Secondary | ICD-10-CM | POA: Diagnosis not present

## 2019-09-11 DIAGNOSIS — H401120 Primary open-angle glaucoma, left eye, stage unspecified: Secondary | ICD-10-CM | POA: Diagnosis not present

## 2019-09-11 DIAGNOSIS — Z882 Allergy status to sulfonamides status: Secondary | ICD-10-CM | POA: Diagnosis not present

## 2019-09-11 DIAGNOSIS — H3581 Retinal edema: Secondary | ICD-10-CM | POA: Diagnosis not present

## 2019-09-11 DIAGNOSIS — Z888 Allergy status to other drugs, medicaments and biological substances status: Secondary | ICD-10-CM | POA: Diagnosis not present

## 2019-09-11 DIAGNOSIS — H1189 Other specified disorders of conjunctiva: Secondary | ICD-10-CM | POA: Diagnosis not present

## 2019-09-11 DIAGNOSIS — H35443 Age-related reticular degeneration of retina, bilateral: Secondary | ICD-10-CM | POA: Diagnosis not present

## 2019-09-11 DIAGNOSIS — H30031 Focal chorioretinal inflammation, peripheral, right eye: Secondary | ICD-10-CM | POA: Diagnosis not present

## 2019-09-11 DIAGNOSIS — Z881 Allergy status to other antibiotic agents status: Secondary | ICD-10-CM | POA: Diagnosis not present

## 2019-09-11 DIAGNOSIS — H43393 Other vitreous opacities, bilateral: Secondary | ICD-10-CM | POA: Diagnosis not present

## 2019-09-11 DIAGNOSIS — H4041X1 Glaucoma secondary to eye inflammation, right eye, mild stage: Secondary | ICD-10-CM | POA: Diagnosis not present

## 2019-09-11 DIAGNOSIS — Z79899 Other long term (current) drug therapy: Secondary | ICD-10-CM | POA: Diagnosis not present

## 2019-09-11 DIAGNOSIS — H44111 Panuveitis, right eye: Secondary | ICD-10-CM | POA: Diagnosis not present

## 2019-09-11 DIAGNOSIS — Z961 Presence of intraocular lens: Secondary | ICD-10-CM | POA: Diagnosis not present

## 2019-10-06 ENCOUNTER — Telehealth: Payer: Self-pay

## 2019-10-06 MED ORDER — ATORVASTATIN CALCIUM 40 MG PO TABS: 40.0000 mg | ORAL_TABLET | Freq: Every day | ORAL | 3 refills | Status: DC

## 2019-10-06 NOTE — Telephone Encounter (Signed)
Patient advised she needs lipid panel.

## 2019-10-09 DIAGNOSIS — N183 Chronic kidney disease, stage 3 unspecified: Secondary | ICD-10-CM | POA: Diagnosis not present

## 2019-10-09 DIAGNOSIS — E1049 Type 1 diabetes mellitus with other diabetic neurological complication: Secondary | ICD-10-CM | POA: Diagnosis not present

## 2019-10-09 DIAGNOSIS — E10649 Type 1 diabetes mellitus with hypoglycemia without coma: Secondary | ICD-10-CM | POA: Diagnosis not present

## 2019-10-09 DIAGNOSIS — E1040 Type 1 diabetes mellitus with diabetic neuropathy, unspecified: Secondary | ICD-10-CM | POA: Diagnosis not present

## 2019-10-09 DIAGNOSIS — E039 Hypothyroidism, unspecified: Secondary | ICD-10-CM | POA: Diagnosis not present

## 2019-10-09 DIAGNOSIS — E1022 Type 1 diabetes mellitus with diabetic chronic kidney disease: Secondary | ICD-10-CM | POA: Diagnosis not present

## 2019-10-09 DIAGNOSIS — E1065 Type 1 diabetes mellitus with hyperglycemia: Secondary | ICD-10-CM | POA: Diagnosis not present

## 2019-10-09 DIAGNOSIS — E1021 Type 1 diabetes mellitus with diabetic nephropathy: Secondary | ICD-10-CM | POA: Diagnosis not present

## 2019-10-17 NOTE — Telephone Encounter (Signed)
This encounter was created in error - please disregard.

## 2019-10-21 ENCOUNTER — Telehealth: Payer: Self-pay | Admitting: *Deleted

## 2019-10-21 DIAGNOSIS — I4891 Unspecified atrial fibrillation: Secondary | ICD-10-CM

## 2019-10-21 DIAGNOSIS — R0789 Other chest pain: Secondary | ICD-10-CM

## 2019-10-21 NOTE — Telephone Encounter (Signed)
   Primary Cardiologist: Dr Curt Bears  Chart reviewed as part of pre-operative protocol coverage.   Simple dental extractions are considered low risk procedures per guidelines and generally do not require any specific cardiac clearance. It is also generally accepted that for simple extractions and dental cleanings, there is no need to interrupt blood thinner therapy.  SBE prophylaxis is not required for the patient.  I will route this recommendation to the requesting party via Epic fax function and remove from pre-op pool.  Please call with questions.  Kerin Ransom, PA-C 10/21/2019, 1:42 PM

## 2019-10-21 NOTE — Telephone Encounter (Signed)
° °  [facility] Pre-operative Risk Assessment    HEARTCARE STAFF: - Please ensure there is not already an duplicate clearance open for this procedure. - Under Visit Info/Reason for Call, type in Other and utilize the format Clearance MM/DD/YY or Clearance TBD. Do not use dashes or single digits. - If request is for dental extraction, please clarify the # of teeth to be extracted.  Request for surgical clearance:  1. What type of surgery is being performed?  SINGLE EXTRACTION TOOTH #13   2. When is this surgery scheduled?  TBD   3. What type of clearance is required (medical clearance vs. Pharmacy clearance to hold med vs. Both)?  BOTH  4. Are there any medications that need to be held prior to surgery and how long? ELIQUIS   5. Practice name and name of physician performing surgery?  [facility] / DR. Jetta Lout   6. What is the office phone number?  [phone]   7.   What is the office fax number?  [phone]  8.   Anesthesia type (None, local, MAC, general) ? IV SEDATION VS. LOCAL ANESTHESIA CONTAINING EPINEPHRINE WITH NITROUS OXIDE   Jeanann Lewandowsky 10/21/2019, 10:29 AM  _________________________________________________________________   (provider comments below)

## 2019-10-24 NOTE — Telephone Encounter (Signed)
Carmen Cooper from [facility] states the patient came in 10/16/2019 and was on a normal rhythm. She states that the extraction is not going to be simple and she will need to hold her blood thinner. She states they need to know if the patient can hold her blood thinner for 1 or 2 days.

## 2019-10-27 NOTE — Telephone Encounter (Signed)
Clinical pharmacist to review how long to hold Eliquis

## 2019-10-28 MED ORDER — APIXABAN 5 MG PO TABS: 5.0000 mg | ORAL_TABLET | Freq: Two times a day (BID) | ORAL | 0 refills | Status: DC

## 2019-10-28 NOTE — Telephone Encounter (Signed)
Patient with diagnosis of afib on Eliquis for anticoagulation.    Procedure: dental extraction Date of procedure: TBD  CHADS2-VASc score of 54 (age x2, sex, CHF, HTN, DM, CAD, TIA)  CrCl 71mL/min Platelet count 273K  Agree that pt should not need to hold Eliquis for single dental extraction. Since office will not do extraction without pt holding, recommend that she just hold 1 dose of Eliquis prior to dental work and resume as soon as safely possible afterwards. She is at elevated cardiovascular risk off of her anticoagulation due to CHADS2VASc score of 9.

## 2019-10-28 NOTE — Addendum Note (Signed)
Addended by: Adaia Matthies E on: 10/28/2019 07:15 AM   Modules accepted: Orders

## 2019-11-07 ENCOUNTER — Telehealth: Payer: Self-pay | Admitting: Radiology

## 2019-11-07 ENCOUNTER — Encounter: Payer: Self-pay | Admitting: Cardiology

## 2019-11-07 ENCOUNTER — Ambulatory Visit (INDEPENDENT_AMBULATORY_CARE_PROVIDER_SITE_OTHER): Payer: Medicare Other | Admitting: Cardiology

## 2019-11-07 ENCOUNTER — Other Ambulatory Visit: Payer: Self-pay

## 2019-11-07 VITALS — BP 132/84 | HR 63 | Ht 65.0 in | Wt 120.0 lb

## 2019-11-07 DIAGNOSIS — I493 Ventricular premature depolarization: Secondary | ICD-10-CM

## 2019-11-07 NOTE — Progress Notes (Signed)
Electrophysiology Office Note   Date:  11/07/2019   ID:  Carmen Cooper, DOB [birth year], MRN [id number]  PCP:  Emeterio Reeve, DO  Cardiologist:   Primary Electrophysiologist:  Latandra Loureiro Meredith Leeds, MD    Chief Complaint: SOB   History of Present Illness: Carmen Cooper is a 79 y.o. female who is being seen today for the evaluation of AF at the request of Emeterio Reeve, DO. Presenting today for electrophysiology evaluation.  She has a history of hypertension, aortic atherosclerosis, diabetes, CKD.  She has been having chest pain and shortness of breath.  She was found to be in atrial fibrillation by her primary physician.  She had an echo that showed an ejection fraction of 40 to 45%.  Today, denies symptoms of chest pain,  orthopnea, PND, lower extremity edema, claudication, dizziness, presyncope, syncope, bleeding, or neurologic sequela. The patient is tolerating medications without difficulties.  Over the past few weeks she has been feeling palpitations.  She states that she usually feels well, but her palpitations have worsened.  She does not feel like she has been in atrial fibrillation though.   She says that she is able to do all of her daily activities, and is only mildly restricted by her shortness of breath.   Past Medical History:  Diagnosis Date  . Atrial fibrillation ([place])   . BCC (basal cell carcinoma of skin)   . Diabetes ([place])   . Glaucoma   . History of TIA (transient ischemic attack) 08/11/2013   12/2012 - [name]   . Hypertension   . Hypothyroidism 08/11/2013  . Memory changes   . Microscopic colitis 08/21/2013   2008 [facility] [name].  Normal colonoscopy 2009 repeat as routine in 2019   . Thyroid disease   . 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, [facility]    Past Surgical History:  Procedure Laterality Date  . CARDIOVERSION N/A 02/12/2019   Procedure: CARDIOVERSION;  Surgeon: [name], MD;  Location: [place];   Service: Cardiovascular;  Laterality: N/A;  . MOHS SURGERY  2019   Nose bcc   . OTHER SURGICAL HISTORY  04/01/2019   biopsy on nose and lip      Current Outpatient Medications  Medication Sig Dispense Refill  . AMBULATORY NON FORMULARY MEDICATION Freestyle light test strips Test twice a day  Dx type 2 diabetes E11.9 100 each 11  . amLODipine (NORVASC) 5 MG tablet Take 1 tablet (5 mg total) by mouth daily. 90 tablet 1  . apixaban (ELIQUIS) 5 MG TABS tablet Take 1 tablet (5 mg total) by mouth 2 (two) times daily. 180 tablet 0  . atorvastatin (LIPITOR) 40 MG tablet Take 1 tablet (40 mg total) by mouth daily. 90 tablet 3  . Dorzolamide HCl-Timolol Mal PF 22.3-6.8 MG/ML SOLN Place 1 drop into both eyes 2 (two) times daily.     . folic acid (FOLVITE) 1 MG tablet Take 1 mg by mouth daily.     Marland Kitchen FREESTYLE LITE test strip     . hydrochlorothiazide (HYDRODIURIL) 12.5 MG tablet TAKE 1 TABLET DAILY 90 tablet 3  . Insulin Human (INSULIN PUMP) SOLN Inject into the skin. insulin lispro (HUMALOG) 100 UNIT/ML     . insulin lispro (HUMALOG) 100 UNIT/ML injection Medtronic 630G pump.  Basal 12-6a 0.625, 6a-7p 0.725, 7p-12a 0.625.  Preset bolus:  4/5/6.  ISF 50.  Total daily dose:  40 units/day    . levothyroxine (SYNTHROID, LEVOTHROID) 112 MCG tablet  Take 1 tablet (112 mcg total) by mouth daily. 90 tablet 3  . Lifitegrast (XIIDRA) 5 % SOLN Place 1 drop into both eyes daily.     Marland Kitchen lisinopril (ZESTRIL) 20 MG tablet Take 20 mg by mouth daily.    . magnesium oxide (MAG-OX) 400 MG tablet Take 400 mg by mouth daily.     . methotrexate (RHEUMATREX) 2.5 MG tablet Take 25 mg by mouth every Monday.     . Multiple Vitamins-Minerals (CENTRUM SILVER 50+WOMEN PO) Take 1 tablet by mouth daily.     . nepafenac (NEVANAC) 0.1 % ophthalmic suspension Place 1 drop into the right eye 3 (three) times daily.     . Omega-3 1000 MG CAPS Take 1,000 mg by mouth daily.     Marland Kitchen ZIOPTAN 0.0015 % SOLN Place 1 drop into both eyes at  bedtime.      No current facility-administered medications for this visit.    Allergies:   Dexamethasone, Brimonidine tartrate, Clindamycin/lincomycin, Sulfa antibiotics, and Valacyclovir hcl   Social History:  The patient  reports that she has been smoking. She has a 5.00 pack-year smoking history. She has never used smokeless tobacco. She reports current alcohol use of about 2.0 standard drinks of alcohol per week. She reports that she does not use drugs.   Family History:  The patient's family history includes Cancer in her mother; Heart attack in her father; Heart disease in her son; Hypertension in her mother.    ROS:  Please see the history of present illness.   Otherwise, review of systems is positive for none.   All other systems are reviewed and negative.   PHYSICAL EXAM: VS:  BP 132/84   Pulse 63   Ht 5\' 5"  (1.651 m)   Wt 120 lb (54.4 kg)   SpO2 99%   BMI 19.97 kg/m  , BMI Body mass index is 19.97 kg/m. GEN: Well nourished, well developed, in no acute distress  HEENT: normal  Neck: no JVD, carotid bruits, or masses Cardiac: Irregular; no murmurs, rubs, or gallops,no edema  Respiratory:  clear to auscultation bilaterally, normal work of breathing GI: soft, nontender, nondistended, + BS MS: no deformity or atrophy  Skin: warm and dry Neuro:  Strength and sensation are intact Psych: euthymic mood, full affect  EKG:  EKG is ordered today. Personal review of the ekg ordered shows sinusyet well lab done before rhythm, PVCs   Recent Labs: 01/03/2019: Brain Natriuretic Peptide 367 03/25/2019: ALT 51; BUN 21; Creat 1.09; Hemoglobin 11.1; Magnesium 1.6; Platelets 273; Potassium 4.7; Sodium 138; TSH 9.93    Lipid Panel     Component Value Date/Time   CHOL 96 03/27/2017 0000   TRIG 75 03/27/2017 0000   HDL 57 03/27/2017 0000   CHOLHDL 1.8 08/11/2013 0934   VLDL 12 08/11/2013 0934   LDLCALC 28 03/27/2017 0000   LDLDIRECT 38 04/03/2018 0853     Wt Readings from Last 3  Encounters:  11/07/19 120 lb (54.4 kg)  09/03/19 120 lb (54.4 kg)  04/01/19 120 lb 8 oz (54.7 kg)      Other studies Reviewed: Additional studies/ records that were reviewed today include: TTE 01/08/19  Review of the above records today demonstrates:   1. Left ventricular ejection fraction, by visual estimation, is 40 to 45%. The left ventricle has moderately decreased function. There is global hypokinesis of the left ventricle. Left ventricular septal wall thickness was mildly increased. Mildly  increased left ventricular posterior wall thickness. There is  mildly increased left ventricular hypertrophy.  2. Left ventricular diastolic parameters are indeterminate.  3. Global right ventricular systolic function is normal visually.The right ventricular size is normal. No increase in right ventricular wall thickness.  4. Left atrial size was severely dilated.  5. Right atrial size was severely dilated.  6. The mitral valve is normal in structure. Mild to moderate mitral valve regurgitation. No evidence of mitral stenosis.  7. The tricuspid valve is normal in structure. Tricuspid valve regurgitation moderate.  8. The aortic valve is normal in structure. Aortic valve regurgitation is not visualized. No evidence of aortic valve sclerosis or stenosis.  9. The pulmonic valve was not well visualized. Pulmonic valve regurgitation is not visualized. 10. Trivial circumferential pericardial effusion is present. 11. Mildly elevated pulmonary artery systolic pressure.   ASSESSMENT AND PLAN:  1.  Persistent atrial fibrillation: Currently on metoprolol, Eliquis.  Fortunately she remains in sinus rhythm.  2.  Chronic systolic failure: Ejection fraction 40 to 45%.  Potentially due to a tachycardia mediated cardiomyopathy.  Currently on lisinopril and Toprol-XL.  3.  PVCs: Patient is in ventricular bigeminy today.  It is possible that her symptoms of shortness of breath and fluttering are due to an elevated  PVC burden.  We Shritha Bresee check a 3-day monitor.  Current medicines are reviewed at length with the patient today.   The patient does not have concerns regarding her medicines.  The following changes were made today: Increase Toprol-XL  Labs/ tests ordered today include:  Orders Placed This Encounter  Procedures  . LONG TERM MONITOR (3-14 DAYS)  . EKG 12-Lead    Disposition:   FU with Letzy Gullickson 3 months  Signed, Elyanna Wallick Meredith Leeds, MD  11/07/2019 4:41 PM     [address] [place] [id number] [phone] (office) [phone] (fax)

## 2019-11-07 NOTE — Patient Instructions (Signed)
Medication Instructions:  Your physician recommends that you continue on your current medications as directed. Please refer to the Current Medication list given to you today.  *If you need a refill on your cardiac medications before your next appointment, please call your pharmacy*   Lab Work: None ordered If you have labs (blood work) drawn today and your tests are completely normal, you will receive your results only by: Marland Kitchen MyChart Message (if you have MyChart) OR . A paper copy in the mail If you have any lab test that is abnormal or we need to change your treatment, we will call you to review the results.   Testing/Procedures: Your physician has recommended that you wear a 3 day holter monitor. Holter monitors are medical devices that record the heart's electrical activity. Doctors most often use these monitors to diagnose arrhythmias. Arrhythmias are problems with the speed or rhythm of the heartbeat. The monitor is a small, portable device. You can wear one while you do your normal daily activities. This is usually used to diagnose what is causing palpitations/syncope (passing out).   Follow-Up: At [facility], you and your health needs are our priority.  As part of our continuing mission to provide you with exceptional heart care, we have created designated Provider Care Teams.  These Care Teams include your primary Cardiologist (physician) and Advanced Practice Providers (APPs -  Physician Assistants and Nurse Practitioners) who all work together to provide you with the care you need, when you need it.  We recommend signing up for the patient portal called "MyChart".  Sign up information is provided on this After Visit Summary.  MyChart is used to connect with patients for Virtual Visits (Telemedicine).  Patients are able to view lab/test results, encounter notes, upcoming appointments, etc.  Non-urgent messages can be sent to your provider as well.   To learn more about what you can  do with MyChart, go to [email or web address].    Your next appointment:    to be determined after monitoring is complete/reviewed  The format for your next appointment:   In Person  Provider:   Allegra Lai, MD    Thank you for choosing [facility] [place]!!   Trinidad Curet, RN [phone]   Other Instructions  ZIO XT- Long Term Monitor Instructions   Your physician has requested you wear your ZIO patch monitor 3 days.   This is a single patch monitor.  [employer] supplies one patch monitor per enrollment.  Additional stickers are not available.   Please do not apply patch if you will be having a Nuclear Stress Test, Echocardiogram, Cardiac CT, MRI, or Chest Xray during the time frame you would be wearing the monitor. The patch cannot be worn during these tests.  You cannot remove and re-apply the ZIO XT patch monitor.   Your ZIO patch monitor will be sent USPS Priority mail from [facility] directly to your home address. The monitor may also be mailed to a PO BOX if home delivery is not available.   It may take 3-5 days to receive your monitor after you have been enrolled.   Once you have received you monitor, please review enclosed instructions.  Your monitor has already been registered assigning a specific monitor serial # to you.   Applying the monitor   Shave hair from upper left chest.   Hold abrader disc by orange tab.  Rub abrader in 40 strokes over left upper chest as indicated in your monitor instructions.   Clean  area with 4 enclosed alcohol pads .  Use all pads to assure are is cleaned thoroughly.  Let dry.   Apply patch as indicated in monitor instructions.  Patch will be place under collarbone on left side of chest with arrow pointing upward.   Rub patch adhesive wings for 2 minutes.Remove white label marked "1".  Remove white label marked "2".  Rub patch adhesive wings for 2 additional minutes.   While looking in a mirror, press and release button in  center of patch.  A small green light will flash 3-4 times .  This will be your only indicator the monitor has been turned on.     Do not shower for the first 24 hours.  You may shower after the first 24 hours.   Press button if you feel a symptom. You will hear a small click.  Record Date, Time and Symptom in the Patient Log Book.   When you are ready to remove patch, follow instructions on last 2 pages of Patient Log Book.  Stick patch monitor onto last page of Patient Log Book.   Place Patient Log Book in Counce box.  Use locking tab on box and tape box closed securely.  The [employer] has [employer] on it.  Please place in mailbox as soon as possible.  Your physician should have your test results approximately 7 days after the monitor has been mailed back to [facility].   Call [place] at [phone] if you have questions regarding your ZIO XT patch monitor.  Call them immediately if you see an orange light blinking on your monitor.   If your monitor falls off in less than 4 days contact our Monitor department at [phone].  If your monitor becomes loose or falls off after 4 days call [employer] at [phone] for suggestions on securing your monitor.

## 2019-11-07 NOTE — Telephone Encounter (Signed)
Enrolled patient for a 3 day Zio XT monitor to be mailed to patients home  

## 2019-11-09 DIAGNOSIS — Z888 Allergy status to other drugs, medicaments and biological substances status: Secondary | ICD-10-CM | POA: Diagnosis not present

## 2019-11-09 DIAGNOSIS — S0501XA Injury of conjunctiva and corneal abrasion without foreign body, right eye, initial encounter: Secondary | ICD-10-CM | POA: Diagnosis not present

## 2019-11-09 DIAGNOSIS — Z87891 Personal history of nicotine dependence: Secondary | ICD-10-CM | POA: Diagnosis not present

## 2019-11-09 DIAGNOSIS — Z882 Allergy status to sulfonamides status: Secondary | ICD-10-CM | POA: Diagnosis not present

## 2019-11-09 DIAGNOSIS — I10 Essential (primary) hypertension: Secondary | ICD-10-CM | POA: Diagnosis not present

## 2019-11-09 DIAGNOSIS — Z881 Allergy status to other antibiotic agents status: Secondary | ICD-10-CM | POA: Diagnosis not present

## 2019-11-09 DIAGNOSIS — E119 Type 2 diabetes mellitus without complications: Secondary | ICD-10-CM | POA: Diagnosis not present

## 2019-11-09 DIAGNOSIS — E785 Hyperlipidemia, unspecified: Secondary | ICD-10-CM | POA: Diagnosis not present

## 2019-11-09 DIAGNOSIS — G8911 Acute pain due to trauma: Secondary | ICD-10-CM | POA: Diagnosis not present

## 2019-11-09 DIAGNOSIS — Z79899 Other long term (current) drug therapy: Secondary | ICD-10-CM | POA: Diagnosis not present

## 2019-11-09 DIAGNOSIS — Z794 Long term (current) use of insulin: Secondary | ICD-10-CM | POA: Diagnosis not present

## 2019-11-09 DIAGNOSIS — E039 Hypothyroidism, unspecified: Secondary | ICD-10-CM | POA: Diagnosis not present

## 2019-11-10 DIAGNOSIS — S0501XA Injury of conjunctiva and corneal abrasion without foreign body, right eye, initial encounter: Secondary | ICD-10-CM | POA: Diagnosis not present

## 2019-11-11 DIAGNOSIS — B0052 Herpesviral keratitis: Secondary | ICD-10-CM | POA: Diagnosis not present

## 2019-11-11 DIAGNOSIS — Z79899 Other long term (current) drug therapy: Secondary | ICD-10-CM | POA: Diagnosis not present

## 2019-11-12 ENCOUNTER — Other Ambulatory Visit (INDEPENDENT_AMBULATORY_CARE_PROVIDER_SITE_OTHER): Payer: Medicare Other

## 2019-11-12 DIAGNOSIS — I493 Ventricular premature depolarization: Secondary | ICD-10-CM

## 2019-11-14 DIAGNOSIS — B0052 Herpesviral keratitis: Secondary | ICD-10-CM | POA: Diagnosis not present

## 2019-11-18 DIAGNOSIS — E039 Hypothyroidism, unspecified: Secondary | ICD-10-CM | POA: Diagnosis not present

## 2019-11-19 DIAGNOSIS — I493 Ventricular premature depolarization: Secondary | ICD-10-CM | POA: Diagnosis not present

## 2019-11-20 DIAGNOSIS — H30031 Focal chorioretinal inflammation, peripheral, right eye: Secondary | ICD-10-CM | POA: Diagnosis not present

## 2019-11-20 DIAGNOSIS — H44111 Panuveitis, right eye: Secondary | ICD-10-CM | POA: Diagnosis not present

## 2019-11-20 DIAGNOSIS — H3581 Retinal edema: Secondary | ICD-10-CM | POA: Diagnosis not present

## 2019-11-20 DIAGNOSIS — Z961 Presence of intraocular lens: Secondary | ICD-10-CM | POA: Diagnosis not present

## 2019-11-20 DIAGNOSIS — H401120 Primary open-angle glaucoma, left eye, stage unspecified: Secondary | ICD-10-CM | POA: Diagnosis not present

## 2019-11-20 DIAGNOSIS — H4041X1 Glaucoma secondary to eye inflammation, right eye, mild stage: Secondary | ICD-10-CM | POA: Diagnosis not present

## 2019-11-20 DIAGNOSIS — Z79899 Other long term (current) drug therapy: Secondary | ICD-10-CM | POA: Diagnosis not present

## 2019-11-20 DIAGNOSIS — H209 Unspecified iridocyclitis: Secondary | ICD-10-CM | POA: Diagnosis not present

## 2019-11-24 DIAGNOSIS — I480 Paroxysmal atrial fibrillation: Secondary | ICD-10-CM | POA: Diagnosis not present

## 2019-11-24 DIAGNOSIS — E039 Hypothyroidism, unspecified: Secondary | ICD-10-CM | POA: Diagnosis not present

## 2019-11-24 DIAGNOSIS — N183 Chronic kidney disease, stage 3 unspecified: Secondary | ICD-10-CM | POA: Diagnosis not present

## 2019-11-24 DIAGNOSIS — E1022 Type 1 diabetes mellitus with diabetic chronic kidney disease: Secondary | ICD-10-CM | POA: Diagnosis not present

## 2019-11-24 DIAGNOSIS — E1065 Type 1 diabetes mellitus with hyperglycemia: Secondary | ICD-10-CM | POA: Diagnosis not present

## 2019-11-26 DIAGNOSIS — E119 Type 2 diabetes mellitus without complications: Secondary | ICD-10-CM | POA: Diagnosis not present

## 2019-11-26 DIAGNOSIS — H4043X3 Glaucoma secondary to eye inflammation, bilateral, severe stage: Secondary | ICD-10-CM | POA: Diagnosis not present

## 2019-11-26 DIAGNOSIS — H209 Unspecified iridocyclitis: Secondary | ICD-10-CM | POA: Diagnosis not present

## 2019-11-26 DIAGNOSIS — H18899 Other specified disorders of cornea, unspecified eye: Secondary | ICD-10-CM | POA: Diagnosis not present

## 2019-11-26 DIAGNOSIS — Z961 Presence of intraocular lens: Secondary | ICD-10-CM | POA: Diagnosis not present

## 2019-11-26 DIAGNOSIS — Z794 Long term (current) use of insulin: Secondary | ICD-10-CM | POA: Diagnosis not present

## 2019-11-28 ENCOUNTER — Telehealth: Payer: Self-pay | Admitting: Osteopathic Medicine

## 2019-11-28 NOTE — Telephone Encounter (Signed)
Patient would like to know if she can switch providers. States that she is not happy with care and wanted to switch to female provider. Please advise.

## 2019-11-28 NOTE — Telephone Encounter (Signed)
Appointment has been made. No further questions at this time.  

## 2019-11-28 NOTE — Telephone Encounter (Signed)
I do not have a problem with her switching if she prefers a female provider Any specific complaints or concerns, I am of course happy to address, patient can be connected with Abigail Butts if desired

## 2019-11-28 NOTE — Telephone Encounter (Signed)
Fine by me if she prefers a female provider.

## 2019-12-24 DIAGNOSIS — I5022 Chronic systolic (congestive) heart failure: Secondary | ICD-10-CM | POA: Diagnosis not present

## 2019-12-24 DIAGNOSIS — R112 Nausea with vomiting, unspecified: Secondary | ICD-10-CM | POA: Diagnosis not present

## 2019-12-24 DIAGNOSIS — E039 Hypothyroidism, unspecified: Secondary | ICD-10-CM | POA: Diagnosis not present

## 2019-12-24 DIAGNOSIS — E079 Disorder of thyroid, unspecified: Secondary | ICD-10-CM | POA: Diagnosis not present

## 2019-12-24 DIAGNOSIS — E785 Hyperlipidemia, unspecified: Secondary | ICD-10-CM | POA: Diagnosis not present

## 2019-12-24 DIAGNOSIS — E1065 Type 1 diabetes mellitus with hyperglycemia: Secondary | ICD-10-CM | POA: Diagnosis not present

## 2019-12-24 DIAGNOSIS — Z881 Allergy status to other antibiotic agents status: Secondary | ICD-10-CM | POA: Diagnosis not present

## 2019-12-24 DIAGNOSIS — F17210 Nicotine dependence, cigarettes, uncomplicated: Secondary | ICD-10-CM | POA: Diagnosis not present

## 2019-12-24 DIAGNOSIS — Z79899 Other long term (current) drug therapy: Secondary | ICD-10-CM | POA: Diagnosis not present

## 2019-12-24 DIAGNOSIS — R918 Other nonspecific abnormal finding of lung field: Secondary | ICD-10-CM | POA: Diagnosis not present

## 2019-12-24 DIAGNOSIS — Z882 Allergy status to sulfonamides status: Secondary | ICD-10-CM | POA: Diagnosis not present

## 2019-12-24 DIAGNOSIS — I700 Atherosclerosis of aorta: Secondary | ICD-10-CM | POA: Diagnosis not present

## 2019-12-24 DIAGNOSIS — I4891 Unspecified atrial fibrillation: Secondary | ICD-10-CM | POA: Diagnosis not present

## 2019-12-24 DIAGNOSIS — E1165 Type 2 diabetes mellitus with hyperglycemia: Secondary | ICD-10-CM | POA: Diagnosis not present

## 2019-12-24 DIAGNOSIS — Z7901 Long term (current) use of anticoagulants: Secondary | ICD-10-CM | POA: Diagnosis not present

## 2019-12-24 DIAGNOSIS — Z794 Long term (current) use of insulin: Secondary | ICD-10-CM | POA: Diagnosis not present

## 2019-12-24 DIAGNOSIS — Z8673 Personal history of transient ischemic attack (TIA), and cerebral infarction without residual deficits: Secondary | ICD-10-CM | POA: Diagnosis not present

## 2019-12-24 DIAGNOSIS — I110 Hypertensive heart disease with heart failure: Secondary | ICD-10-CM | POA: Diagnosis not present

## 2019-12-24 DIAGNOSIS — R079 Chest pain, unspecified: Secondary | ICD-10-CM | POA: Diagnosis not present

## 2019-12-24 DIAGNOSIS — I959 Hypotension, unspecified: Secondary | ICD-10-CM | POA: Diagnosis not present

## 2019-12-24 DIAGNOSIS — R9431 Abnormal electrocardiogram [ECG] [EKG]: Secondary | ICD-10-CM | POA: Diagnosis not present

## 2019-12-24 DIAGNOSIS — Z9641 Presence of insulin pump (external) (internal): Secondary | ICD-10-CM | POA: Diagnosis not present

## 2019-12-24 DIAGNOSIS — Z888 Allergy status to other drugs, medicaments and biological substances status: Secondary | ICD-10-CM | POA: Diagnosis not present

## 2019-12-24 DIAGNOSIS — J984 Other disorders of lung: Secondary | ICD-10-CM | POA: Diagnosis not present

## 2020-01-06 DIAGNOSIS — E039 Hypothyroidism, unspecified: Secondary | ICD-10-CM | POA: Diagnosis not present

## 2020-01-13 DIAGNOSIS — E1065 Type 1 diabetes mellitus with hyperglycemia: Secondary | ICD-10-CM | POA: Diagnosis not present

## 2020-01-13 DIAGNOSIS — E1022 Type 1 diabetes mellitus with diabetic chronic kidney disease: Secondary | ICD-10-CM | POA: Diagnosis not present

## 2020-01-13 DIAGNOSIS — Z9641 Presence of insulin pump (external) (internal): Secondary | ICD-10-CM | POA: Diagnosis not present

## 2020-01-13 DIAGNOSIS — E10649 Type 1 diabetes mellitus with hypoglycemia without coma: Secondary | ICD-10-CM | POA: Diagnosis not present

## 2020-01-13 DIAGNOSIS — E1049 Type 1 diabetes mellitus with other diabetic neurological complication: Secondary | ICD-10-CM | POA: Diagnosis not present

## 2020-01-13 DIAGNOSIS — N183 Chronic kidney disease, stage 3 unspecified: Secondary | ICD-10-CM | POA: Diagnosis not present

## 2020-01-15 DIAGNOSIS — H30031 Focal chorioretinal inflammation, peripheral, right eye: Secondary | ICD-10-CM | POA: Diagnosis not present

## 2020-01-15 DIAGNOSIS — H401120 Primary open-angle glaucoma, left eye, stage unspecified: Secondary | ICD-10-CM | POA: Diagnosis not present

## 2020-01-15 DIAGNOSIS — Z79899 Other long term (current) drug therapy: Secondary | ICD-10-CM | POA: Diagnosis not present

## 2020-01-15 DIAGNOSIS — H44111 Panuveitis, right eye: Secondary | ICD-10-CM | POA: Diagnosis not present

## 2020-01-15 DIAGNOSIS — H4041X1 Glaucoma secondary to eye inflammation, right eye, mild stage: Secondary | ICD-10-CM | POA: Diagnosis not present

## 2020-01-15 DIAGNOSIS — H209 Unspecified iridocyclitis: Secondary | ICD-10-CM | POA: Diagnosis not present

## 2020-01-15 DIAGNOSIS — H3581 Retinal edema: Secondary | ICD-10-CM | POA: Diagnosis not present

## 2020-01-15 DIAGNOSIS — Z961 Presence of intraocular lens: Secondary | ICD-10-CM | POA: Diagnosis not present

## 2020-01-23 DIAGNOSIS — H18831 Recurrent erosion of cornea, right eye: Secondary | ICD-10-CM | POA: Diagnosis not present

## 2020-01-23 DIAGNOSIS — H5711 Ocular pain, right eye: Secondary | ICD-10-CM | POA: Diagnosis not present

## 2020-01-27 DIAGNOSIS — H18831 Recurrent erosion of cornea, right eye: Secondary | ICD-10-CM | POA: Diagnosis not present

## 2020-02-09 DIAGNOSIS — E10311 Type 1 diabetes mellitus with unspecified diabetic retinopathy with macular edema: Secondary | ICD-10-CM | POA: Diagnosis not present

## 2020-02-12 DIAGNOSIS — H18831 Recurrent erosion of cornea, right eye: Secondary | ICD-10-CM | POA: Diagnosis not present

## 2020-02-15 DIAGNOSIS — E1165 Type 2 diabetes mellitus with hyperglycemia: Secondary | ICD-10-CM | POA: Diagnosis not present

## 2020-02-15 DIAGNOSIS — I4891 Unspecified atrial fibrillation: Secondary | ICD-10-CM | POA: Diagnosis not present

## 2020-02-15 DIAGNOSIS — Z881 Allergy status to other antibiotic agents status: Secondary | ICD-10-CM | POA: Diagnosis not present

## 2020-02-15 DIAGNOSIS — Z20822 Contact with and (suspected) exposure to covid-19: Secondary | ICD-10-CM | POA: Diagnosis not present

## 2020-02-15 DIAGNOSIS — Z79899 Other long term (current) drug therapy: Secondary | ICD-10-CM | POA: Diagnosis not present

## 2020-02-15 DIAGNOSIS — E039 Hypothyroidism, unspecified: Secondary | ICD-10-CM | POA: Diagnosis not present

## 2020-02-15 DIAGNOSIS — Z8673 Personal history of transient ischemic attack (TIA), and cerebral infarction without residual deficits: Secondary | ICD-10-CM | POA: Diagnosis not present

## 2020-02-15 DIAGNOSIS — I10 Essential (primary) hypertension: Secondary | ICD-10-CM | POA: Diagnosis not present

## 2020-02-15 DIAGNOSIS — R410 Disorientation, unspecified: Secondary | ICD-10-CM | POA: Diagnosis not present

## 2020-02-15 DIAGNOSIS — E785 Hyperlipidemia, unspecified: Secondary | ICD-10-CM | POA: Diagnosis not present

## 2020-02-15 DIAGNOSIS — I5022 Chronic systolic (congestive) heart failure: Secondary | ICD-10-CM | POA: Diagnosis not present

## 2020-02-15 DIAGNOSIS — Z87891 Personal history of nicotine dependence: Secondary | ICD-10-CM | POA: Diagnosis not present

## 2020-02-15 DIAGNOSIS — E1065 Type 1 diabetes mellitus with hyperglycemia: Secondary | ICD-10-CM | POA: Diagnosis not present

## 2020-02-15 DIAGNOSIS — Z7901 Long term (current) use of anticoagulants: Secondary | ICD-10-CM | POA: Diagnosis not present

## 2020-02-15 DIAGNOSIS — Z882 Allergy status to sulfonamides status: Secondary | ICD-10-CM | POA: Diagnosis not present

## 2020-02-15 DIAGNOSIS — T85614A Breakdown (mechanical) of insulin pump, initial encounter: Secondary | ICD-10-CM | POA: Diagnosis not present

## 2020-02-15 DIAGNOSIS — Z888 Allergy status to other drugs, medicaments and biological substances status: Secondary | ICD-10-CM | POA: Diagnosis not present

## 2020-02-15 DIAGNOSIS — R112 Nausea with vomiting, unspecified: Secondary | ICD-10-CM | POA: Diagnosis not present

## 2020-02-15 DIAGNOSIS — Z794 Long term (current) use of insulin: Secondary | ICD-10-CM | POA: Diagnosis not present

## 2020-02-15 DIAGNOSIS — I110 Hypertensive heart disease with heart failure: Secondary | ICD-10-CM | POA: Diagnosis not present

## 2020-02-16 DIAGNOSIS — Z961 Presence of intraocular lens: Secondary | ICD-10-CM | POA: Diagnosis not present

## 2020-02-16 DIAGNOSIS — H3581 Retinal edema: Secondary | ICD-10-CM | POA: Diagnosis not present

## 2020-02-16 DIAGNOSIS — H44111 Panuveitis, right eye: Secondary | ICD-10-CM | POA: Diagnosis not present

## 2020-02-16 DIAGNOSIS — Z79899 Other long term (current) drug therapy: Secondary | ICD-10-CM | POA: Diagnosis not present

## 2020-02-16 DIAGNOSIS — H401120 Primary open-angle glaucoma, left eye, stage unspecified: Secondary | ICD-10-CM | POA: Diagnosis not present

## 2020-02-16 DIAGNOSIS — H30031 Focal chorioretinal inflammation, peripheral, right eye: Secondary | ICD-10-CM | POA: Diagnosis not present

## 2020-02-16 DIAGNOSIS — H209 Unspecified iridocyclitis: Secondary | ICD-10-CM | POA: Diagnosis not present

## 2020-02-16 DIAGNOSIS — H4041X1 Glaucoma secondary to eye inflammation, right eye, mild stage: Secondary | ICD-10-CM | POA: Diagnosis not present

## 2020-02-17 DIAGNOSIS — B0059 Other herpesviral disease of eye: Secondary | ICD-10-CM | POA: Diagnosis not present

## 2020-02-17 DIAGNOSIS — Z79899 Other long term (current) drug therapy: Secondary | ICD-10-CM | POA: Diagnosis not present

## 2020-02-17 DIAGNOSIS — H18891 Other specified disorders of cornea, right eye: Secondary | ICD-10-CM | POA: Diagnosis not present

## 2020-02-17 DIAGNOSIS — H30031 Focal chorioretinal inflammation, peripheral, right eye: Secondary | ICD-10-CM | POA: Diagnosis not present

## 2020-02-17 DIAGNOSIS — Z7952 Long term (current) use of systemic steroids: Secondary | ICD-10-CM | POA: Diagnosis not present

## 2020-02-17 DIAGNOSIS — Z961 Presence of intraocular lens: Secondary | ICD-10-CM | POA: Diagnosis not present

## 2020-02-17 DIAGNOSIS — H4041X1 Glaucoma secondary to eye inflammation, right eye, mild stage: Secondary | ICD-10-CM | POA: Diagnosis not present

## 2020-02-17 DIAGNOSIS — H209 Unspecified iridocyclitis: Secondary | ICD-10-CM | POA: Diagnosis not present

## 2020-02-17 DIAGNOSIS — H44111 Panuveitis, right eye: Secondary | ICD-10-CM | POA: Diagnosis not present

## 2020-02-17 DIAGNOSIS — H18899 Other specified disorders of cornea, unspecified eye: Secondary | ICD-10-CM | POA: Diagnosis not present

## 2020-02-17 DIAGNOSIS — E11319 Type 2 diabetes mellitus with unspecified diabetic retinopathy without macular edema: Secondary | ICD-10-CM | POA: Diagnosis not present

## 2020-02-17 DIAGNOSIS — Z794 Long term (current) use of insulin: Secondary | ICD-10-CM | POA: Diagnosis not present

## 2020-02-17 DIAGNOSIS — H401120 Primary open-angle glaucoma, left eye, stage unspecified: Secondary | ICD-10-CM | POA: Diagnosis not present

## 2020-02-17 DIAGNOSIS — H35351 Cystoid macular degeneration, right eye: Secondary | ICD-10-CM | POA: Diagnosis not present

## 2020-02-17 DIAGNOSIS — E119 Type 2 diabetes mellitus without complications: Secondary | ICD-10-CM | POA: Diagnosis not present

## 2020-02-24 DIAGNOSIS — E119 Type 2 diabetes mellitus without complications: Secondary | ICD-10-CM | POA: Diagnosis not present

## 2020-02-24 DIAGNOSIS — H44111 Panuveitis, right eye: Secondary | ICD-10-CM | POA: Diagnosis not present

## 2020-02-24 DIAGNOSIS — H30033 Focal chorioretinal inflammation, peripheral, bilateral: Secondary | ICD-10-CM | POA: Diagnosis not present

## 2020-02-24 DIAGNOSIS — Z7952 Long term (current) use of systemic steroids: Secondary | ICD-10-CM | POA: Diagnosis not present

## 2020-02-24 DIAGNOSIS — Z79899 Other long term (current) drug therapy: Secondary | ICD-10-CM | POA: Diagnosis not present

## 2020-02-24 DIAGNOSIS — H18899 Other specified disorders of cornea, unspecified eye: Secondary | ICD-10-CM | POA: Diagnosis not present

## 2020-02-24 DIAGNOSIS — B0059 Other herpesviral disease of eye: Secondary | ICD-10-CM | POA: Diagnosis not present

## 2020-02-24 DIAGNOSIS — Z794 Long term (current) use of insulin: Secondary | ICD-10-CM | POA: Diagnosis not present

## 2020-03-08 ENCOUNTER — Encounter: Payer: Medicare Other | Admitting: Osteopathic Medicine

## 2020-03-09 ENCOUNTER — Encounter: Payer: Self-pay | Admitting: Family Medicine

## 2020-03-09 ENCOUNTER — Other Ambulatory Visit: Payer: Self-pay

## 2020-03-09 ENCOUNTER — Ambulatory Visit (INDEPENDENT_AMBULATORY_CARE_PROVIDER_SITE_OTHER): Payer: Medicare Other | Admitting: Family Medicine

## 2020-03-09 VITALS — BP 164/75 | HR 65 | Temp 98.2°F | Wt 124.6 lb

## 2020-03-09 DIAGNOSIS — E031 Congenital hypothyroidism without goiter: Secondary | ICD-10-CM

## 2020-03-09 DIAGNOSIS — I4891 Unspecified atrial fibrillation: Secondary | ICD-10-CM | POA: Diagnosis not present

## 2020-03-09 DIAGNOSIS — IMO0002 Reserved for concepts with insufficient information to code with codable children: Secondary | ICD-10-CM

## 2020-03-09 DIAGNOSIS — K219 Gastro-esophageal reflux disease without esophagitis: Secondary | ICD-10-CM

## 2020-03-09 DIAGNOSIS — E1165 Type 2 diabetes mellitus with hyperglycemia: Secondary | ICD-10-CM

## 2020-03-09 DIAGNOSIS — E785 Hyperlipidemia, unspecified: Secondary | ICD-10-CM | POA: Diagnosis not present

## 2020-03-09 DIAGNOSIS — E1069 Type 1 diabetes mellitus with other specified complication: Secondary | ICD-10-CM

## 2020-03-09 DIAGNOSIS — Z23 Encounter for immunization: Secondary | ICD-10-CM | POA: Diagnosis not present

## 2020-03-09 DIAGNOSIS — I152 Hypertension secondary to endocrine disorders: Secondary | ICD-10-CM

## 2020-03-09 DIAGNOSIS — E782 Mixed hyperlipidemia: Secondary | ICD-10-CM | POA: Diagnosis not present

## 2020-03-09 DIAGNOSIS — I700 Atherosclerosis of aorta: Secondary | ICD-10-CM

## 2020-03-09 DIAGNOSIS — E1159 Type 2 diabetes mellitus with other circulatory complications: Secondary | ICD-10-CM | POA: Diagnosis not present

## 2020-03-09 DIAGNOSIS — E1139 Type 2 diabetes mellitus with other diabetic ophthalmic complication: Secondary | ICD-10-CM | POA: Diagnosis not present

## 2020-03-09 NOTE — Patient Instructions (Signed)
Very nice to meet you today! Please continue current medications.  See me again in 6 months.

## 2020-03-10 NOTE — Assessment & Plan Note (Signed)
Managed by endocrinology.  Current treatment with Tirosint.  Feels good at current dose.

## 2020-03-10 NOTE — Assessment & Plan Note (Signed)
She is currently on pump with CGM.  This is managed by endocrinology.

## 2020-03-10 NOTE — Assessment & Plan Note (Addendum)
BP is a little elevated today.  She will check at home and let me know what her readings look like.  She will continue lisinopril for now at current strength.

## 2020-03-10 NOTE — Progress Notes (Signed)
Carmen Cooper - 80 y.o. female MRN [id number]  Date of birth: 1940/09/04  Subjective Chief Complaint  Patient presents with  . Establish Care    HPI Carmen Cooper is a 80 y.o. female here today for initial visit with me.  She has a history of HTN, A. Fib, hypothyroidism, T1DM, HLD, history of CVA, glaucoma and corneal disease.    HTN is managed with lisinopril.  She is tolerating this well.  She has history of A. Fib as well s/p cardioversion.  She denies symptoms at this time including palpitations, dizziness, sob, or fatigue.  She is anticoagulated with Eliquis.  She continues to see cardiology.    Diabetes is managed by endocrinology.  She has brittle diabetes and is currently on a pump with CGM. She has had occasional hypoglycemic episodes.  She feels like her carbohydrate intake has been consistent.  She does have associated eye disease and is seeing ophthalmology.  She has glaucoma and corneal disease.  She has lost vision in her R eye and is being considered for corneal transplant.    Hypothyroidism is also managed by endocrinology.  She is on Tirosint 132mcg daily.    HLD is managed with Omega-3 and atorvastatin.  Ding well with this at this time.  She denies myalgias related to use.   ROS:  A comprehensive ROS was completed and negative except as noted per HPI  Allergies  Allergen Reactions  . Dexamethasone Anaphylaxis and Other (See Comments)    Blood sugar elevated    . Brimonidine Tartrate Other (See Comments)    Burning and redness   . Clindamycin/Lincomycin Rash  . Sulfa Antibiotics Rash  . Valacyclovir Hcl Rash    Past Medical History:  Diagnosis Date  . Atrial fibrillation ([place])   . BCC (basal cell carcinoma of skin)   . Diabetes ([place])   . Glaucoma   . History of TIA (transient ischemic attack) 08/11/2013   12/2012 - [name]   . Hypertension   . Hypothyroidism 08/11/2013  . Memory changes   . Microscopic colitis 08/21/2013   2008 [facility]  [name].  Normal colonoscopy 2009 repeat as routine in 2019   . Thyroid disease   . 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, [facility]     Past Surgical History:  Procedure Laterality Date  . CARDIOVERSION N/A 02/12/2019   Procedure: CARDIOVERSION;  Surgeon: [name], MD;  Location: [place];  Service: Cardiovascular;  Laterality: N/A;  . MOHS SURGERY  2019   Nose bcc   . OTHER SURGICAL HISTORY  04/01/2019   biopsy on nose and lip     Social History   Socioeconomic History  . Marital status: Married    Spouse name: Rachel Bo  . Number of children: 1  . Years of education: 2  . Highest education level: 12th grade  Occupational History  . Occupation: Retired    Comment: retired  Tobacco Use  . Smoking status: Current Every Day Smoker    Packs/day: 0.25    Years: 20.00    Pack years: 5.00  . Smokeless tobacco: Never Used  Vaping Use  . Vaping Use: Never used  Substance and Sexual Activity  . Alcohol use: Yes    Alcohol/week: 2.0 standard drinks    Types: 2 Shots of liquor per week    Comment: 2-3 a day  . Drug use: No  . Sexual activity: Not Currently    Partners: Male  Other Topics  Concern  . Not on file  Social History Narrative   Patient takes care of her husband who has dementia. Doesn't get out much. Drinks 2-3 cups of hot tea daily.   Right-handed.   Social Determinants of Health   Financial Resource Strain: Not on file  Food Insecurity: Not on file  Transportation Needs: Not on file  Physical Activity: Not on file  Stress: Not on file  Social Connections: Not on file    Family History  Problem Relation Age of Onset  . Heart disease Son   . Hypertension Mother   . Cancer Mother        unsure of origin  . Heart attack Father   . Diabetes Neg Hx     Health Maintenance  Topic Date Due  . FOOT EXAM  04/04/2019  . HEMOGLOBIN A1C  09/22/2019  . OPHTHALMOLOGY EXAM  11/04/2019  . COVID-19 Vaccine (4 - Booster for Pfizer series)  05/25/2020  . TETANUS/TDAP  07/25/2025  . INFLUENZA VACCINE  Completed  . DEXA SCAN  Completed  . PNA vac Low Risk Adult  Completed     ----------------------------------------------------------------------------------------------------------------------------------------------------------------------------------------------------------------- Physical Exam BP (!) 164/75 (BP Location: Right Arm, Patient Position: Sitting, Cuff Size: Small)   Pulse 65   Temp 98.2 F (36.8 C)   Wt 124 lb 9.6 oz (56.5 kg)   SpO2 100%   BMI 20.73 kg/m   Physical Exam Constitutional:      Appearance: Normal appearance.  Eyes:     General: No scleral icterus. Cardiovascular:     Rate and Rhythm: Normal rate and regular rhythm.  Pulmonary:     Effort: Pulmonary effort is normal.     Breath sounds: Normal breath sounds.  Musculoskeletal:     Cervical back: Neck supple.  Neurological:     General: No focal deficit present.     Mental Status: She is alert.  Psychiatric:        Mood and Affect: Mood normal.     ------------------------------------------------------------------------------------------------------------------------------------------------------------------------------------------------------------------- Assessment and Plan  Hypertension associated with diabetes ([place]) BP is a little elevated today.  She will check at home and let me know what her readings look like.  She will continue lisinopril for now at current strength.   Atrial fibrillation [facility]) S/p cardioversion.  CHADS-VASc score of 7.  She will continue to see cardiology and will continue anticoagulation with eliquis.    Aortic atherosclerosis ([facility]) Continue statin at current strength.  GERD (gastroesophageal reflux disease) Currently on famotidine.  Continue at current strength.    Hypothyroidism Managed by endocrinology.  Current treatment with Tirosint.  Feels good at current dose.  Dyslipidemia due to type 1  diabetes mellitus ([place]) She is currently on pump with CGM.  This is managed by endocrinology.  Hyperlipidemia She is tolerating atorvastatin well.  Continue at current strength.  Uncontrolled diabetes mellitus with eye complications ([place]) She has glaucoma as well as corneal eye disease.  She is being considered for corneal transplant to the right eye.   No orders of the defined types were placed in this encounter.   Return in about 6 months (around 09/06/2020) for HLD/HTN.    This visit occurred during the SARS-CoV-2 public health emergency.  Safety protocols were in place, including screening questions prior to the visit, additional usage of staff PPE, and extensive cleaning of exam room while observing appropriate contact time as indicated for disinfecting solutions.

## 2020-03-10 NOTE — Assessment & Plan Note (Signed)
She has glaucoma as well as corneal eye disease.  She is being considered for corneal transplant to the right eye.

## 2020-03-10 NOTE — Assessment & Plan Note (Signed)
Currently on famotidine.  Continue at current strength.

## 2020-03-10 NOTE — Assessment & Plan Note (Signed)
S/p cardioversion.  CHADS-VASc score of 7.  She will continue to see cardiology and will continue anticoagulation with eliquis.

## 2020-03-10 NOTE — Assessment & Plan Note (Signed)
She is tolerating atorvastatin well.  Continue at current strength.

## 2020-03-10 NOTE — Assessment & Plan Note (Signed)
Continue statin at current strength.

## 2020-03-15 DIAGNOSIS — H44111 Panuveitis, right eye: Secondary | ICD-10-CM | POA: Diagnosis not present

## 2020-03-15 DIAGNOSIS — Z79899 Other long term (current) drug therapy: Secondary | ICD-10-CM | POA: Diagnosis not present

## 2020-03-15 DIAGNOSIS — H30033 Focal chorioretinal inflammation, peripheral, bilateral: Secondary | ICD-10-CM | POA: Diagnosis not present

## 2020-03-15 DIAGNOSIS — E119 Type 2 diabetes mellitus without complications: Secondary | ICD-10-CM | POA: Diagnosis not present

## 2020-03-15 DIAGNOSIS — Z794 Long term (current) use of insulin: Secondary | ICD-10-CM | POA: Diagnosis not present

## 2020-03-15 DIAGNOSIS — H1820 Unspecified corneal edema: Secondary | ICD-10-CM | POA: Diagnosis not present

## 2020-03-15 DIAGNOSIS — Z7952 Long term (current) use of systemic steroids: Secondary | ICD-10-CM | POA: Diagnosis not present

## 2020-03-18 DIAGNOSIS — H401120 Primary open-angle glaucoma, left eye, stage unspecified: Secondary | ICD-10-CM | POA: Diagnosis not present

## 2020-03-18 DIAGNOSIS — Z881 Allergy status to other antibiotic agents status: Secondary | ICD-10-CM | POA: Diagnosis not present

## 2020-03-18 DIAGNOSIS — H30031 Focal chorioretinal inflammation, peripheral, right eye: Secondary | ICD-10-CM | POA: Diagnosis not present

## 2020-03-18 DIAGNOSIS — H44111 Panuveitis, right eye: Secondary | ICD-10-CM | POA: Diagnosis not present

## 2020-03-18 DIAGNOSIS — H1820 Unspecified corneal edema: Secondary | ICD-10-CM | POA: Diagnosis not present

## 2020-03-18 DIAGNOSIS — R748 Abnormal levels of other serum enzymes: Secondary | ICD-10-CM | POA: Diagnosis not present

## 2020-03-18 DIAGNOSIS — Z961 Presence of intraocular lens: Secondary | ICD-10-CM | POA: Diagnosis not present

## 2020-03-18 DIAGNOSIS — Z79899 Other long term (current) drug therapy: Secondary | ICD-10-CM | POA: Diagnosis not present

## 2020-03-18 DIAGNOSIS — H209 Unspecified iridocyclitis: Secondary | ICD-10-CM | POA: Diagnosis not present

## 2020-03-18 DIAGNOSIS — Z882 Allergy status to sulfonamides status: Secondary | ICD-10-CM | POA: Diagnosis not present

## 2020-03-18 DIAGNOSIS — Z888 Allergy status to other drugs, medicaments and biological substances status: Secondary | ICD-10-CM | POA: Diagnosis not present

## 2020-03-18 DIAGNOSIS — H3581 Retinal edema: Secondary | ICD-10-CM | POA: Diagnosis not present

## 2020-03-18 DIAGNOSIS — H4041X1 Glaucoma secondary to eye inflammation, right eye, mild stage: Secondary | ICD-10-CM | POA: Diagnosis not present

## 2020-04-13 DIAGNOSIS — E039 Hypothyroidism, unspecified: Secondary | ICD-10-CM | POA: Diagnosis not present

## 2020-04-16 DIAGNOSIS — E1022 Type 1 diabetes mellitus with diabetic chronic kidney disease: Secondary | ICD-10-CM | POA: Diagnosis not present

## 2020-04-16 DIAGNOSIS — E1049 Type 1 diabetes mellitus with other diabetic neurological complication: Secondary | ICD-10-CM | POA: Diagnosis not present

## 2020-04-16 DIAGNOSIS — E039 Hypothyroidism, unspecified: Secondary | ICD-10-CM | POA: Diagnosis not present

## 2020-04-16 DIAGNOSIS — N183 Chronic kidney disease, stage 3 unspecified: Secondary | ICD-10-CM | POA: Diagnosis not present

## 2020-04-16 DIAGNOSIS — E1065 Type 1 diabetes mellitus with hyperglycemia: Secondary | ICD-10-CM | POA: Diagnosis not present

## 2020-05-07 ENCOUNTER — Other Ambulatory Visit: Payer: Self-pay | Admitting: Cardiology

## 2020-05-07 MED ORDER — LISINOPRIL 20 MG PO TABS: 20.0000 mg | ORAL_TABLET | Freq: Every day | ORAL | 1 refills | Status: DC

## 2020-05-10 IMAGING — DX DG CHEST 2V
2 series · 2 of 2 positions shown · non-contrast
Comparison: 04/01/2014

CLINICAL DATA: Cough, nausea and chest pain for the past 3 weeks.

EXAM:
CHEST - 2 VIEW

[chest pa]
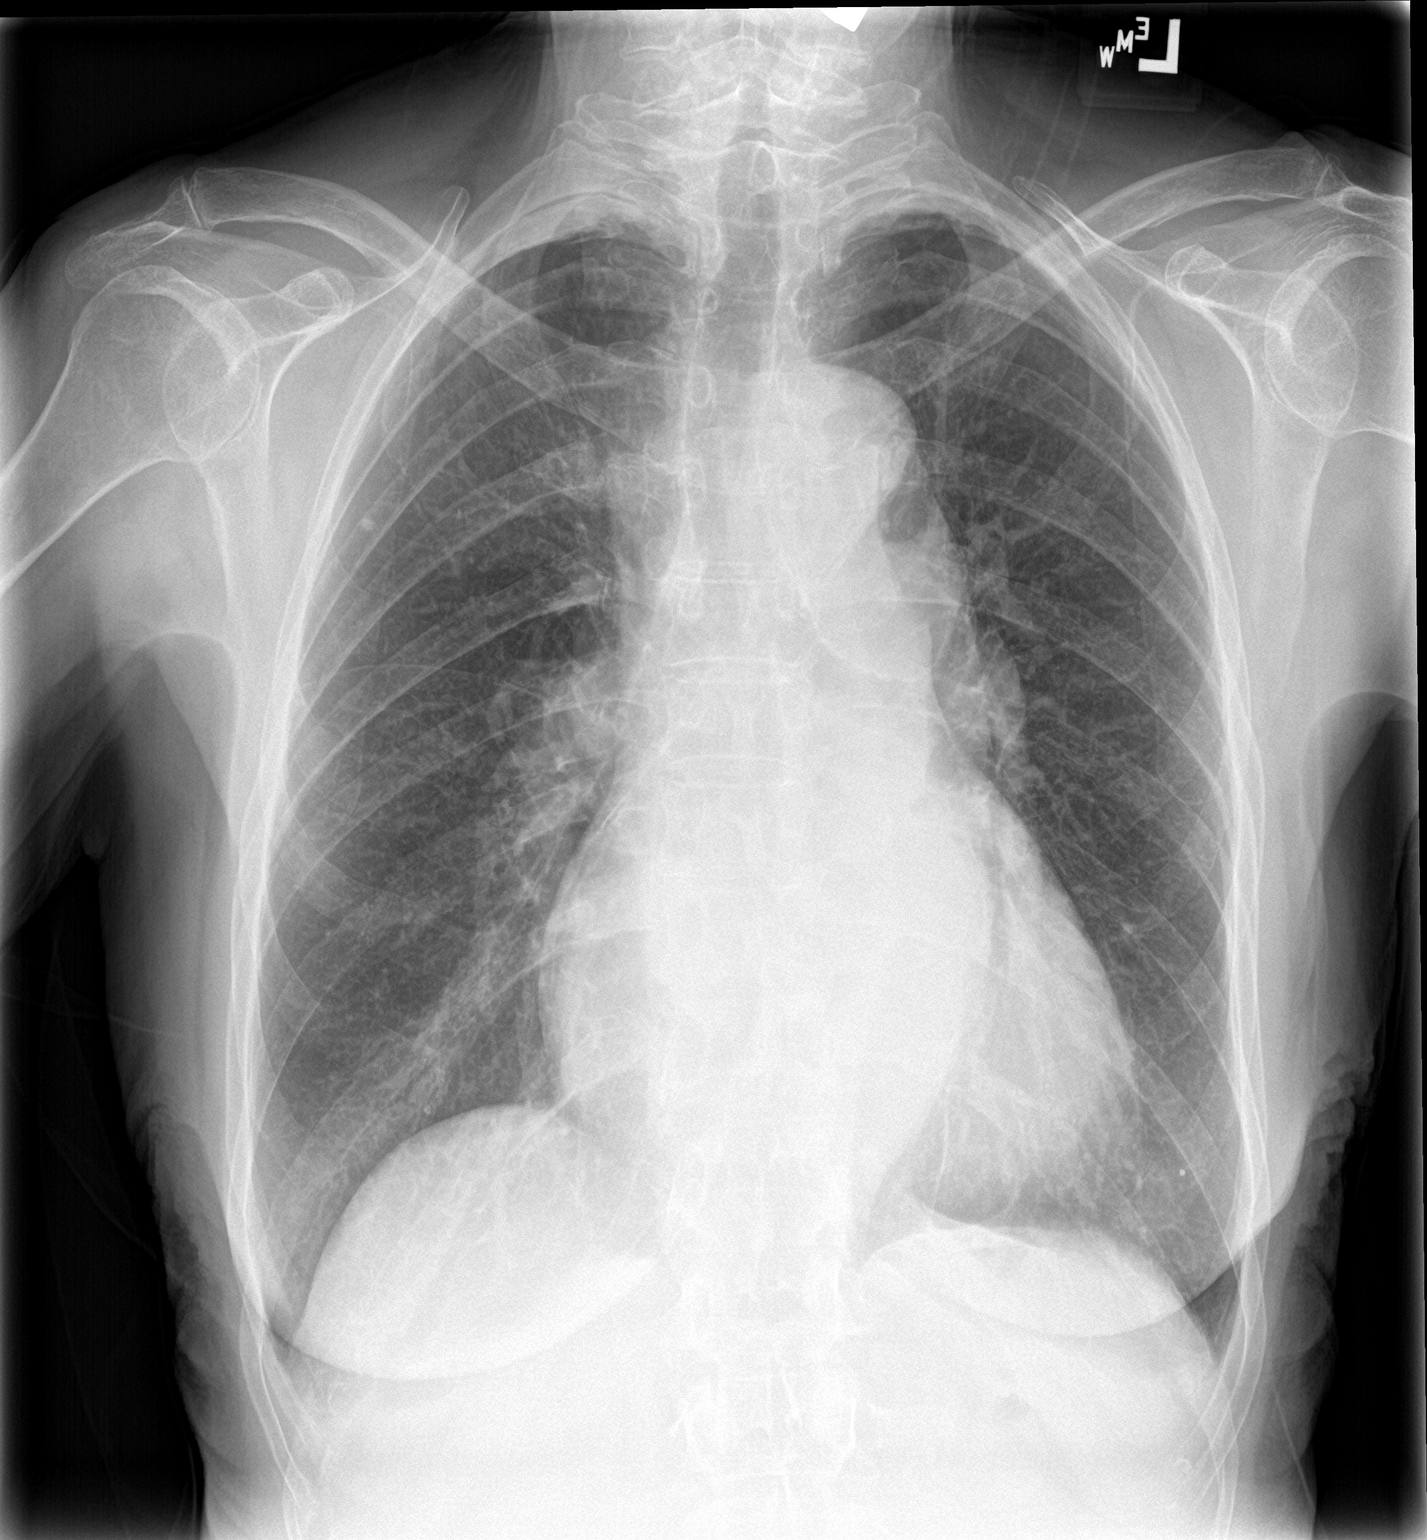

[chest lat]
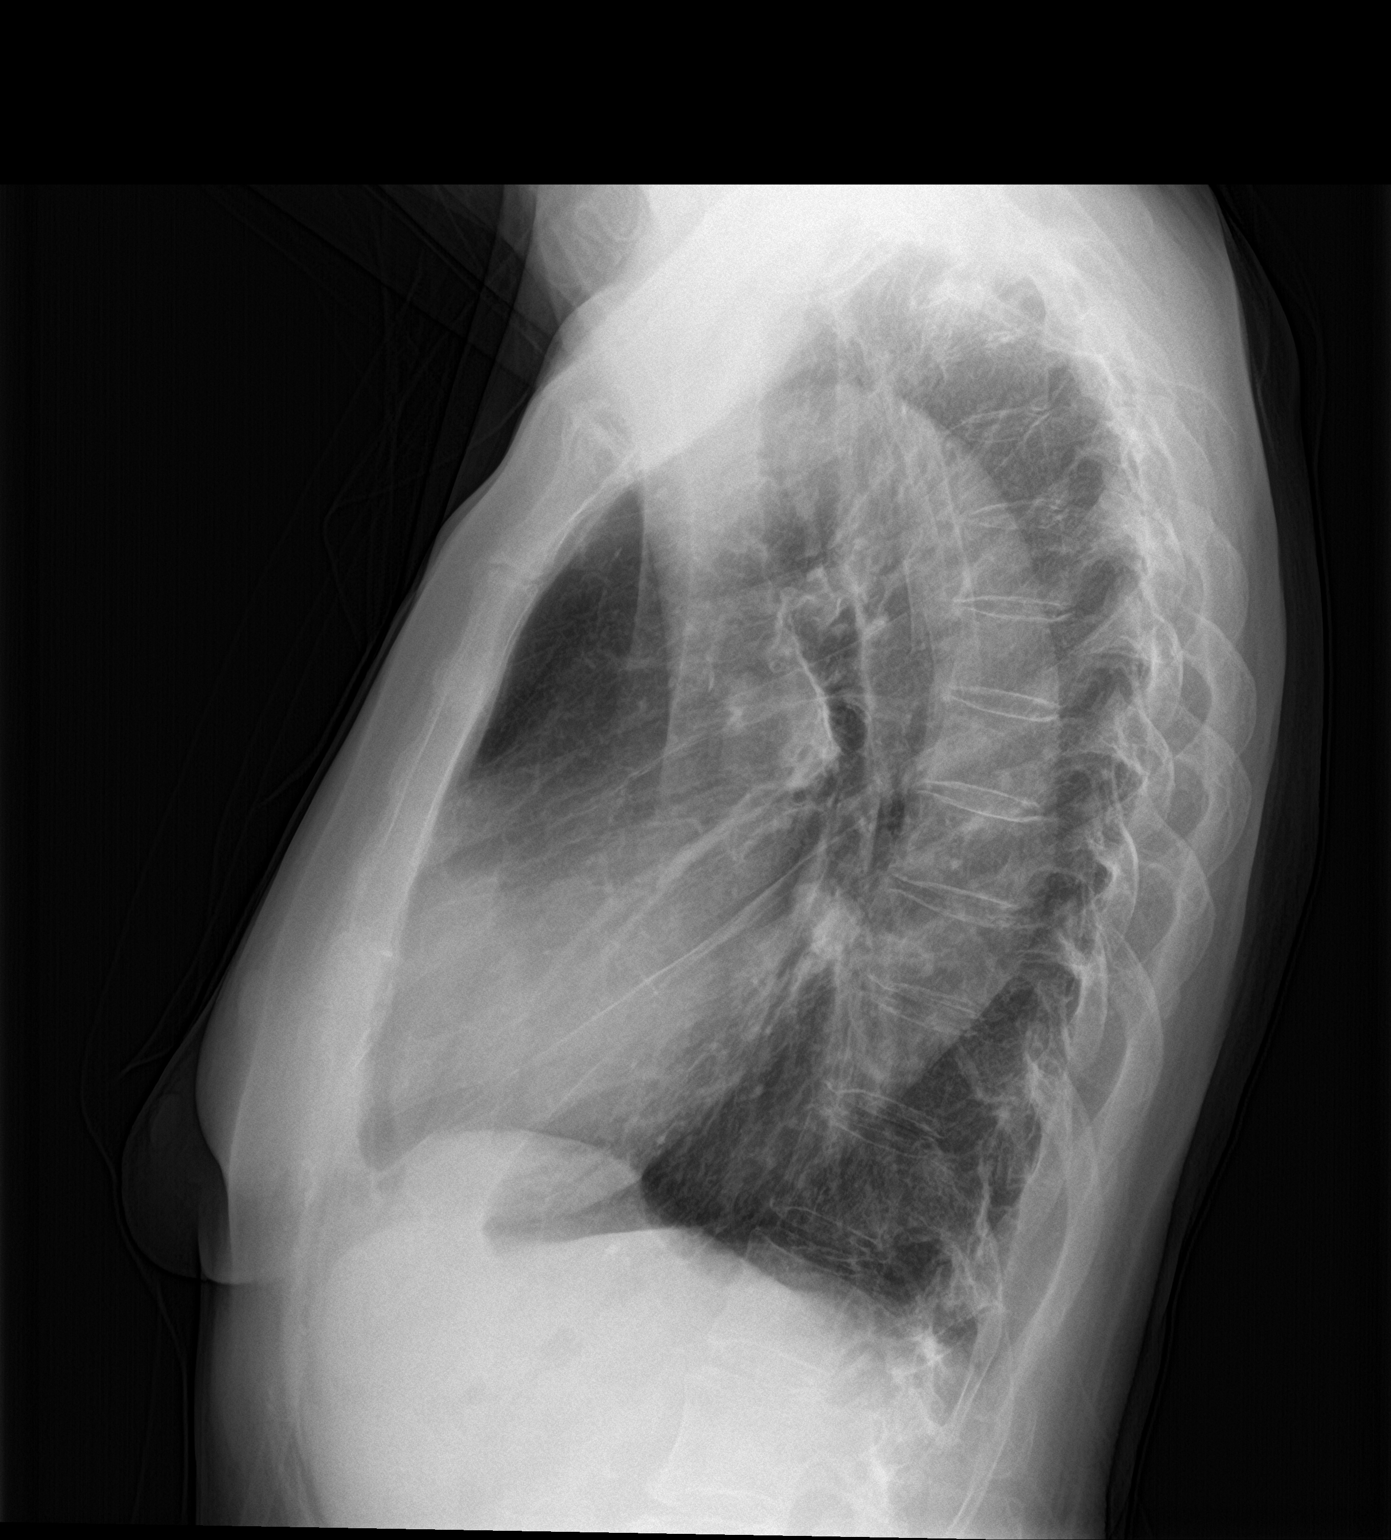

[2 of 2 positions shown; findings below may reference images not displayed]

FINDINGS: Grossly unchanged enlarged cardiac silhouette and mediastinal
contours with tortuosity and potential ectasia of the thoracic
aorta. Punctate (approximately 3 mm) granuloma overlies the
peripheral aspect of the right upper lung, unchanged compared to the
[DATE] examination. The lungs remain hyperexpanded with flattening
of the diaphragms. No discrete focal airspace opacities. No pleural
effusion or pneumothorax. No evidence of edema. No acute osseous
abnormalities.
IMPRESSION: 1. Similar findings of cardiomegaly and lung hyperexpansion without
superimposed acute cardiopulmonary disease.
2. Unchanged punctate granuloma overlies the peripheral aspect the
right upper lung, unchanged compared to the 7902 examination and
thus of benign etiology.

## 2020-05-19 ENCOUNTER — Telehealth: Payer: Self-pay | Admitting: Cardiology

## 2020-05-19 NOTE — Telephone Encounter (Signed)
Pt c/o medication issue:  1. Name of Medication: lisinopril (ZESTRIL) 10 MG tablet  2. How are you currently taking this medication (dosage and times per day)? 5 mg daily  3. Are you having a reaction (difficulty breathing--STAT)? no  4. What is your medication issue? Patient [facility] a [facility] She states she takes half a 10 mg tablet daily, but the prescription listed in her chart is 20 mg tablets. Please advise.

## 2020-05-19 NOTE — Telephone Encounter (Signed)
Pt reports that she is taking Lisinopril 5 mg daily (taking half a 10 mg tablet). She has enough medication until she follow up w/ Dr. Curt Bears 4/18. She reports normal HRs & BPs. Aware we will address dosing when she follows up on 4/18. Patient verbalized understanding and agreeable to plan.

## 2020-05-31 ENCOUNTER — Ambulatory Visit (INDEPENDENT_AMBULATORY_CARE_PROVIDER_SITE_OTHER): Payer: Medicare Other | Admitting: Cardiology

## 2020-05-31 ENCOUNTER — Encounter: Payer: Self-pay | Admitting: Cardiology

## 2020-05-31 ENCOUNTER — Other Ambulatory Visit: Payer: Self-pay

## 2020-05-31 VITALS — BP 156/72 | HR 61 | Ht 65.0 in | Wt 128.0 lb

## 2020-05-31 DIAGNOSIS — H3581 Retinal edema: Secondary | ICD-10-CM | POA: Diagnosis not present

## 2020-05-31 DIAGNOSIS — I4819 Other persistent atrial fibrillation: Secondary | ICD-10-CM

## 2020-05-31 DIAGNOSIS — H4041X1 Glaucoma secondary to eye inflammation, right eye, mild stage: Secondary | ICD-10-CM | POA: Diagnosis not present

## 2020-05-31 DIAGNOSIS — H44111 Panuveitis, right eye: Secondary | ICD-10-CM | POA: Diagnosis not present

## 2020-05-31 DIAGNOSIS — H401120 Primary open-angle glaucoma, left eye, stage unspecified: Secondary | ICD-10-CM | POA: Diagnosis not present

## 2020-05-31 DIAGNOSIS — H209 Unspecified iridocyclitis: Secondary | ICD-10-CM | POA: Diagnosis not present

## 2020-05-31 DIAGNOSIS — Z79899 Other long term (current) drug therapy: Secondary | ICD-10-CM | POA: Diagnosis not present

## 2020-05-31 DIAGNOSIS — Z961 Presence of intraocular lens: Secondary | ICD-10-CM | POA: Diagnosis not present

## 2020-05-31 DIAGNOSIS — H30031 Focal chorioretinal inflammation, peripheral, right eye: Secondary | ICD-10-CM | POA: Diagnosis not present

## 2020-05-31 NOTE — Progress Notes (Signed)
Electrophysiology Office Note   Date:  05/31/2020   ID:  Carmen Cooper, DOB Oct 06, 1940, MRN [id number]  PCP:  Carmen Nutting, DO  Cardiologist:   Primary Electrophysiologist:  Ramez Arrona Meredith Leeds, MD    Chief Complaint: SOB   History of Present Illness: Carmen Cooper is a 80 y.o. female who is being seen today for the evaluation of AF at the request of Carmen Nutting, DO. Presenting today for electrophysiology evaluation.  She has a history significant for hypertension, aortic atherosclerosis, diabetes, CKD.  She developed chest pain and shortness of breath and was found to be in atrial fibrillation by her primary physician.  Ejection fraction was found to be 40 to 45%.  Today, denies symptoms of palpitations, chest pain, shortness of breath, orthopnea, PND, lower extremity edema, claudication, dizziness, presyncope, syncope, bleeding, or neurologic sequela. The patient is tolerating medications without difficulties.  Since last being seen she has done well.  She has had minimal episodes of atrial fibrillation.  She is comfortable with her overall control.  She is planning to have eye surgery in a few weeks.  She is going to have a corneal transplant.   Past Medical History:  Diagnosis Date  . Atrial fibrillation ([place])   . BCC (basal cell carcinoma of skin)   . Diabetes ([place])   . Glaucoma   . History of TIA (transient ischemic attack) 08/11/2013   12/2012 - [name]   . Hypertension   . Hypothyroidism 08/11/2013  . Memory changes   . Microscopic colitis 08/21/2013   2008 [facility] [name].  Normal colonoscopy 2009 repeat as routine in 2019   . Thyroid disease   . 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, [facility]    Past Surgical History:  Procedure Laterality Date  . CARDIOVERSION N/A 02/12/2019   Procedure: CARDIOVERSION;  Surgeon: [name], MD;  Location: [facility];  Service: Cardiovascular;  Laterality: N/A;  . MOHS SURGERY  2019   Nose  bcc   . OTHER SURGICAL HISTORY  04/01/2019   biopsy on nose and lip      Current Outpatient Medications  Medication Sig Dispense Refill  . AMBULATORY NON FORMULARY MEDICATION Freestyle light test strips Test twice a day  Dx type 2 diabetes E11.9 100 each 11  . apixaban (ELIQUIS) 5 MG TABS tablet Take 1 tablet (5 mg total) by mouth 2 (two) times daily. 180 tablet 0  . atorvastatin (LIPITOR) 40 MG tablet Take 1 tablet (40 mg total) by mouth daily. 90 tablet 3  . folic acid (FOLVITE) 1 MG tablet Take 1 mg by mouth daily.     Marland Kitchen FREESTYLE LITE test strip     . Insulin Disposable Pump (OMNIPOD DASH 5 PACK PODS) MISC Inject into the skin.    Marland Kitchen insulin glargine (LANTUS SOLOSTAR) 100 UNIT/ML Solostar Pen Use in case of pump failure: Remove pump and take 15 units of Lantus insulin as basal insulin in case of insulin pump malfunction/failure. .Do not place pump back on until 20 hours AFTER last dose of Lantus to prevent "double basal infusion."    . Insulin Human (INSULIN PUMP) SOLN Inject into the skin. insulin lispro (HUMALOG) 100 UNIT/ML     . insulin lispro (HUMALOG) 100 UNIT/ML injection Medtronic 630G pump.  Basal 12-6a 0.625, 6a-7p 0.725, 7p-12a 0.625.  Preset bolus:  4/5/6.  ISF 50.  Total daily dose:  40 units/day    . Lifitegrast (XIIDRA) 5 % SOLN Place 1  drop into both eyes daily.     Marland Kitchen lisinopril (ZESTRIL) 20 MG tablet Take 10 mg by mouth daily.    . magnesium oxide (MAG-OX) 400 MG tablet Take 400 mg by mouth daily.     . Multiple Vitamins-Minerals (CENTRUM SILVER 50+WOMEN PO) Take 1 tablet by mouth daily.     . mycophenolate (CELLCEPT) 500 MG tablet Take by mouth.    . nepafenac (NEVANAC) 0.1 % ophthalmic suspension Place 1 drop into the right eye 3 (three) times daily.     Marland Kitchen ofloxacin (OCUFLOX) 0.3 % ophthalmic solution Place 1 drop into the right eye 4 times daily.    . Omega-3 1000 MG CAPS Take 1,000 mg by mouth daily.     . prednisoLONE acetate (PRED FORTE) 1 % ophthalmic suspension  Place 1 drop into the right eye 4 times daily.    . SURE COMFORT PEN NEEDLES 31G X 8 MM MISC     . TIROSINT 100 MCG CAPS Take 1 capsule by mouth daily.    Marland Kitchen ZIOPTAN 0.0015 % SOLN Place 1 drop into both eyes at bedtime.      No current facility-administered medications for this visit.    Allergies:   Dexamethasone, Brimonidine tartrate, Clindamycin/lincomycin, Sulfa antibiotics, and Valacyclovir hcl   Social History:  The patient  reports that she has been smoking. She has a 5.00 pack-year smoking history. She has never used smokeless tobacco. She reports current alcohol use of about 2.0 standard drinks of alcohol per week. She reports that she does not use drugs.   Family History:  The patient's family history includes Cancer in her mother; Heart attack in her father; Heart disease in her son; Hypertension in her mother.   ROS:  Please see the history of present illness.   Otherwise, review of systems is positive for none.   All other systems are reviewed and negative.   PHYSICAL EXAM: VS:  BP (!) 156/72   Pulse 61   Ht 5\' 5"  (1.651 m)   Wt 128 lb (58.1 kg)   SpO2 98%   BMI 21.30 kg/m  , BMI Body mass index is 21.3 kg/m. GEN: Well nourished, well developed, in no acute distress  HEENT: normal  Neck: no JVD, carotid bruits, or masses Cardiac: RRR; no murmurs, rubs, or gallops,no edema  Respiratory:  clear to auscultation bilaterally, normal work of breathing GI: soft, nontender, nondistended, + BS MS: no deformity or atrophy  Skin: warm and dry Neuro:  Strength and sensation are intact Psych: euthymic mood, full affect  EKG:  EKG is ordered today. Personal review of the ekg ordered shows sinus rhythm, PACs, rate 61  Recent Labs: No results found for requested labs within last 8760 hours.    Lipid Panel     Component Value Date/Time   CHOL 96 03/27/2017 0000   TRIG 75 03/27/2017 0000   HDL 57 03/27/2017 0000   CHOLHDL 1.8 08/11/2013 0934   VLDL 12 08/11/2013 0934    LDLCALC 28 03/27/2017 0000   LDLDIRECT 38 04/03/2018 0853     Wt Readings from Last 3 Encounters:  05/31/20 128 lb (58.1 kg)  03/09/20 124 lb 9.6 oz (56.5 kg)  11/07/19 120 lb (54.4 kg)      Other studies Reviewed: Additional studies/ records that were reviewed today include: TTE 01/08/19  Review of the above records today demonstrates:   1. Left ventricular ejection fraction, by visual estimation, is 40 to 45%. The left ventricle has moderately decreased  function. There is global hypokinesis of the left ventricle. Left ventricular septal wall thickness was mildly increased. Mildly  increased left ventricular posterior wall thickness. There is mildly increased left ventricular hypertrophy.  2. Left ventricular diastolic parameters are indeterminate.  3. Global right ventricular systolic function is normal visually.The right ventricular size is normal. No increase in right ventricular wall thickness.  4. Left atrial size was severely dilated.  5. Right atrial size was severely dilated.  6. The mitral valve is normal in structure. Mild to moderate mitral valve regurgitation. No evidence of mitral stenosis.  7. The tricuspid valve is normal in structure. Tricuspid valve regurgitation moderate.  8. The aortic valve is normal in structure. Aortic valve regurgitation is not visualized. No evidence of aortic valve sclerosis or stenosis.  9. The pulmonic valve was not well visualized. Pulmonic valve regurgitation is not visualized. 10. Trivial circumferential pericardial effusion is present. 11. Mildly elevated pulmonary artery systolic pressure.   ASSESSMENT AND PLAN:  1.  Persistent atrial fibrillation: Currently on Toprol-XL and Eliquis.  CHA2DS2-VASc of 5.  She has minimal episodes of atrial fibrillation.  We Karyn Brull continue with current management.  2.  Chronic systolic heart failure: Ejection fraction 40 to 45%.  Potentially due to tachycardia mediated cardiomyopathy.  Continue  lisinopril and Toprol-XL.  3.  PVCs: Monitor last fall showed a burden of less than 1%.  Continue to monitor.    Current medicines are reviewed at length with the patient today.   The patient does not have concerns regarding her medicines.  The following changes were made today: None  Labs/ tests ordered today include:  Orders Placed This Encounter  Procedures  . EKG 12-Lead    Disposition:   FU with Adreyan Carbajal 6 months  Signed, Ashna Dorough Meredith Leeds, MD  05/31/2020 3:55 PM     [place] [address] [place] [address] [phone] (office) [phone] (fax)

## 2020-05-31 NOTE — Patient Instructions (Signed)
Medication Instructions:  °Your physician recommends that you continue on your current medications as directed. Please refer to the Current Medication list given to you today. ° °*If you need a refill on your cardiac medications before your next appointment, please call your pharmacy* ° ° °Lab Work: °None ordered ° ° °Testing/Procedures: °None ordered ° ° °Follow-Up: °At [facility], you and your health needs are our priority.  As part of our continuing mission to provide you with exceptional heart care, we have created designated Provider Care Teams.  These Care Teams include your primary Cardiologist (physician) and Advanced Practice Providers (APPs -  Physician Assistants and Nurse Practitioners) who all work together to provide you with the care you need, when you need it. ° °Your next appointment:   °6 month(s) ° °The format for your next appointment:   °In Person ° °Provider:   °Will Camnitz, MD ° ° ° °Thank you for choosing [facility]!! ° ° °Alzora Ha, RN °[phone] °  °

## 2020-06-02 ENCOUNTER — Telehealth: Payer: Self-pay | Admitting: Cardiology

## 2020-06-02 MED ORDER — LISINOPRIL 20 MG PO TABS: 10.0000 mg | ORAL_TABLET | Freq: Every day | ORAL | 3 refills | Status: DC

## 2020-06-02 NOTE — Telephone Encounter (Signed)
*  STAT* If patient is at the pharmacy, call can be transferred to refill team.   1. Which medications need to be refilled? (please list name of each medication and dose if known)  Lisinopril  2. Which pharmacy/location (including street and city if local pharmacy) is medication to be sent to? [facility]  3. Do they need a 30 day or 90 day supply? 90 days and refills

## 2020-06-02 NOTE — Telephone Encounter (Signed)
Pt's medication was sent to pt's pharmacy as requested. Confirmation received.  °

## 2020-06-09 DIAGNOSIS — M8580 Other specified disorders of bone density and structure, unspecified site: Secondary | ICD-10-CM | POA: Diagnosis not present

## 2020-06-09 DIAGNOSIS — Z87891 Personal history of nicotine dependence: Secondary | ICD-10-CM | POA: Diagnosis not present

## 2020-06-09 DIAGNOSIS — I10 Essential (primary) hypertension: Secondary | ICD-10-CM | POA: Diagnosis not present

## 2020-06-09 DIAGNOSIS — I4891 Unspecified atrial fibrillation: Secondary | ICD-10-CM | POA: Diagnosis not present

## 2020-06-09 DIAGNOSIS — N1831 Chronic kidney disease, stage 3a: Secondary | ICD-10-CM | POA: Diagnosis not present

## 2020-06-09 DIAGNOSIS — M1990 Unspecified osteoarthritis, unspecified site: Secondary | ICD-10-CM | POA: Diagnosis not present

## 2020-06-09 DIAGNOSIS — H1820 Unspecified corneal edema: Secondary | ICD-10-CM | POA: Diagnosis not present

## 2020-06-09 DIAGNOSIS — K219 Gastro-esophageal reflux disease without esophagitis: Secondary | ICD-10-CM | POA: Diagnosis not present

## 2020-06-09 DIAGNOSIS — H409 Unspecified glaucoma: Secondary | ICD-10-CM | POA: Diagnosis not present

## 2020-06-09 DIAGNOSIS — I130 Hypertensive heart and chronic kidney disease with heart failure and stage 1 through stage 4 chronic kidney disease, or unspecified chronic kidney disease: Secondary | ICD-10-CM | POA: Diagnosis not present

## 2020-06-09 DIAGNOSIS — E1122 Type 2 diabetes mellitus with diabetic chronic kidney disease: Secondary | ICD-10-CM | POA: Diagnosis not present

## 2020-06-09 DIAGNOSIS — H1811 Bullous keratopathy, right eye: Secondary | ICD-10-CM | POA: Diagnosis not present

## 2020-06-09 DIAGNOSIS — Z85828 Personal history of other malignant neoplasm of skin: Secondary | ICD-10-CM | POA: Diagnosis not present

## 2020-06-09 DIAGNOSIS — I700 Atherosclerosis of aorta: Secondary | ICD-10-CM | POA: Diagnosis not present

## 2020-06-09 DIAGNOSIS — E11311 Type 2 diabetes mellitus with unspecified diabetic retinopathy with macular edema: Secondary | ICD-10-CM | POA: Diagnosis not present

## 2020-06-09 DIAGNOSIS — E039 Hypothyroidism, unspecified: Secondary | ICD-10-CM | POA: Diagnosis not present

## 2020-06-09 DIAGNOSIS — K529 Noninfective gastroenteritis and colitis, unspecified: Secondary | ICD-10-CM | POA: Diagnosis not present

## 2020-06-09 DIAGNOSIS — Z7901 Long term (current) use of anticoagulants: Secondary | ICD-10-CM | POA: Diagnosis not present

## 2020-06-09 DIAGNOSIS — E1110 Type 2 diabetes mellitus with ketoacidosis without coma: Secondary | ICD-10-CM | POA: Diagnosis not present

## 2020-06-09 DIAGNOSIS — I5023 Acute on chronic systolic (congestive) heart failure: Secondary | ICD-10-CM | POA: Diagnosis not present

## 2020-06-09 DIAGNOSIS — I272 Pulmonary hypertension, unspecified: Secondary | ICD-10-CM | POA: Diagnosis not present

## 2020-06-09 DIAGNOSIS — H59011 Keratopathy (bullous aphakic) following cataract surgery, right eye: Secondary | ICD-10-CM | POA: Diagnosis not present

## 2020-06-09 DIAGNOSIS — Z8673 Personal history of transient ischemic attack (TIA), and cerebral infarction without residual deficits: Secondary | ICD-10-CM | POA: Diagnosis not present

## 2020-06-09 DIAGNOSIS — H44119 Panuveitis, unspecified eye: Secondary | ICD-10-CM | POA: Diagnosis not present

## 2020-06-09 DIAGNOSIS — Z79899 Other long term (current) drug therapy: Secondary | ICD-10-CM | POA: Diagnosis not present

## 2020-06-09 DIAGNOSIS — Z794 Long term (current) use of insulin: Secondary | ICD-10-CM | POA: Diagnosis not present

## 2020-06-09 DIAGNOSIS — Z7982 Long term (current) use of aspirin: Secondary | ICD-10-CM | POA: Diagnosis not present

## 2020-06-10 DIAGNOSIS — Z794 Long term (current) use of insulin: Secondary | ICD-10-CM | POA: Diagnosis not present

## 2020-06-10 DIAGNOSIS — H401120 Primary open-angle glaucoma, left eye, stage unspecified: Secondary | ICD-10-CM | POA: Diagnosis not present

## 2020-06-10 DIAGNOSIS — H30031 Focal chorioretinal inflammation, peripheral, right eye: Secondary | ICD-10-CM | POA: Diagnosis not present

## 2020-06-10 DIAGNOSIS — Z48810 Encounter for surgical aftercare following surgery on the sense organs: Secondary | ICD-10-CM | POA: Diagnosis not present

## 2020-06-10 DIAGNOSIS — H4041X1 Glaucoma secondary to eye inflammation, right eye, mild stage: Secondary | ICD-10-CM | POA: Diagnosis not present

## 2020-06-10 DIAGNOSIS — H1820 Unspecified corneal edema: Secondary | ICD-10-CM | POA: Diagnosis not present

## 2020-06-10 DIAGNOSIS — Z947 Corneal transplant status: Secondary | ICD-10-CM | POA: Diagnosis not present

## 2020-06-10 DIAGNOSIS — H209 Unspecified iridocyclitis: Secondary | ICD-10-CM | POA: Diagnosis not present

## 2020-06-10 DIAGNOSIS — E119 Type 2 diabetes mellitus without complications: Secondary | ICD-10-CM | POA: Diagnosis not present

## 2020-06-10 DIAGNOSIS — B0052 Herpesviral keratitis: Secondary | ICD-10-CM | POA: Diagnosis not present

## 2020-06-16 DIAGNOSIS — B0052 Herpesviral keratitis: Secondary | ICD-10-CM | POA: Diagnosis not present

## 2020-06-16 DIAGNOSIS — Z947 Corneal transplant status: Secondary | ICD-10-CM | POA: Diagnosis not present

## 2020-06-16 DIAGNOSIS — H4041X1 Glaucoma secondary to eye inflammation, right eye, mild stage: Secondary | ICD-10-CM | POA: Diagnosis not present

## 2020-06-16 DIAGNOSIS — H1820 Unspecified corneal edema: Secondary | ICD-10-CM | POA: Diagnosis not present

## 2020-06-16 DIAGNOSIS — H401120 Primary open-angle glaucoma, left eye, stage unspecified: Secondary | ICD-10-CM | POA: Diagnosis not present

## 2020-06-16 DIAGNOSIS — H30031 Focal chorioretinal inflammation, peripheral, right eye: Secondary | ICD-10-CM | POA: Diagnosis not present

## 2020-06-16 DIAGNOSIS — Z79899 Other long term (current) drug therapy: Secondary | ICD-10-CM | POA: Diagnosis not present

## 2020-06-16 DIAGNOSIS — H209 Unspecified iridocyclitis: Secondary | ICD-10-CM | POA: Diagnosis not present

## 2020-06-24 ENCOUNTER — Other Ambulatory Visit: Payer: Self-pay | Admitting: Cardiology

## 2020-06-24 DIAGNOSIS — R0789 Other chest pain: Secondary | ICD-10-CM

## 2020-06-24 DIAGNOSIS — I4891 Unspecified atrial fibrillation: Secondary | ICD-10-CM

## 2020-06-24 NOTE — Telephone Encounter (Signed)
Prescription refill request for Eliquis received. Indication: afib  Last office visit: Camnitz, 05/31/2020 Scr: 0.92, 05/31/2020 Age: 80 yo Weight: 58.1 kg   Per dosing criteria, pt qualifies for a dose decrease of Eliquis.

## 2020-06-24 NOTE — Telephone Encounter (Addendum)
Spoke with Milas Hock D, who instructed to decrease pt's Eliquis dose to 2.5mg  BID.   Called and spoke to pt's son and made him aware that pt is now suppose to take Eliquis 2.5mg  (1 TABLET) twice and day. Son stated that pt has a 2 week supply left of Eliquis 5mg  tablets. Informed him it is okay for pt to finish the  Eliquis 5mg  tablets- taking 1/2 a tablet twice a day until new refill comes in for Eliquis 2.5 mg tablets. Son verbalized understanding.   Called and spoke to pt, made her aware that she is now suppose to take Eliquis 2.5mg  ( 1 tablet) twice a day. Informed her it is Okay to break the Eliquis 5mg  tablets in 1/2 to make 2.5mg  until the new shipment of Eliquis 2.5mg  tablets come in. Pt verbalized understanding.

## 2020-07-19 DIAGNOSIS — H401120 Primary open-angle glaucoma, left eye, stage unspecified: Secondary | ICD-10-CM | POA: Diagnosis not present

## 2020-07-19 DIAGNOSIS — Z794 Long term (current) use of insulin: Secondary | ICD-10-CM | POA: Diagnosis not present

## 2020-07-19 DIAGNOSIS — B0052 Herpesviral keratitis: Secondary | ICD-10-CM | POA: Diagnosis not present

## 2020-07-19 DIAGNOSIS — E119 Type 2 diabetes mellitus without complications: Secondary | ICD-10-CM | POA: Diagnosis not present

## 2020-07-19 DIAGNOSIS — H44111 Panuveitis, right eye: Secondary | ICD-10-CM | POA: Diagnosis not present

## 2020-07-19 DIAGNOSIS — Z947 Corneal transplant status: Secondary | ICD-10-CM | POA: Diagnosis not present

## 2020-07-19 DIAGNOSIS — H4041X1 Glaucoma secondary to eye inflammation, right eye, mild stage: Secondary | ICD-10-CM | POA: Diagnosis not present

## 2020-07-19 DIAGNOSIS — H30031 Focal chorioretinal inflammation, peripheral, right eye: Secondary | ICD-10-CM | POA: Diagnosis not present

## 2020-07-21 DIAGNOSIS — Z794 Long term (current) use of insulin: Secondary | ICD-10-CM | POA: Diagnosis not present

## 2020-07-21 DIAGNOSIS — E119 Type 2 diabetes mellitus without complications: Secondary | ICD-10-CM | POA: Diagnosis not present

## 2020-07-21 DIAGNOSIS — Z961 Presence of intraocular lens: Secondary | ICD-10-CM | POA: Diagnosis not present

## 2020-07-21 DIAGNOSIS — H4043X3 Glaucoma secondary to eye inflammation, bilateral, severe stage: Secondary | ICD-10-CM | POA: Diagnosis not present

## 2020-07-21 DIAGNOSIS — H209 Unspecified iridocyclitis: Secondary | ICD-10-CM | POA: Diagnosis not present

## 2020-07-21 DIAGNOSIS — Z947 Corneal transplant status: Secondary | ICD-10-CM | POA: Diagnosis not present

## 2020-07-21 LAB — HM DIABETES EYE EXAM

## 2020-07-29 IMAGING — MR MR HEAD WO/W CM
14 series · 47 of 48 positions shown · IV contrast (gadavist)
Comparison: None.

CLINICAL DATA: Chronic headaches

EXAM:
MRI HEAD WITHOUT AND WITH CONTRAST
TECHNIQUE: Multiplanar, multiecho pulse sequences of the brain and surrounding
structures were obtained without and with intravenous contrast.
CONTRAST:  7.5mL GADAVIST GADOBUTROL 1 MMOL/ML IV SOLN

[Series 3: DWI · axial · 3.0mm · 1.20mm/px · z∈[-61,+99]mm · 4 of 55 slices shown (1 of 4)]
[im 1/55]
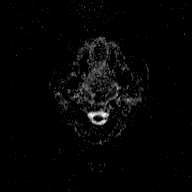
[im 19/55]
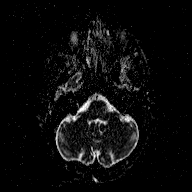
[im 37/55]
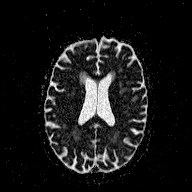
[im 55/55]
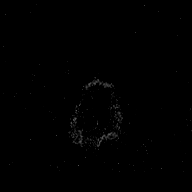

[Series 5: DWI · coronal · 3.0mm · 1.15mm/px · 4 of 47 slices shown (2 of 4)]
[im 1/47]
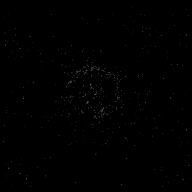
[im 16/47]
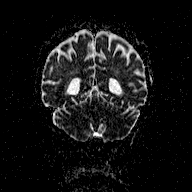
[im 31/47]
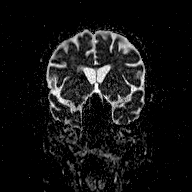
[im 47/47]
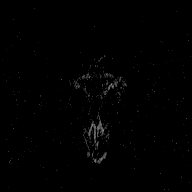

[Series 6: T1 · sagittal · 5.0mm · 0.45mm/px · 2 of 23 slices shown (1 of 2)]
[im 1/23]
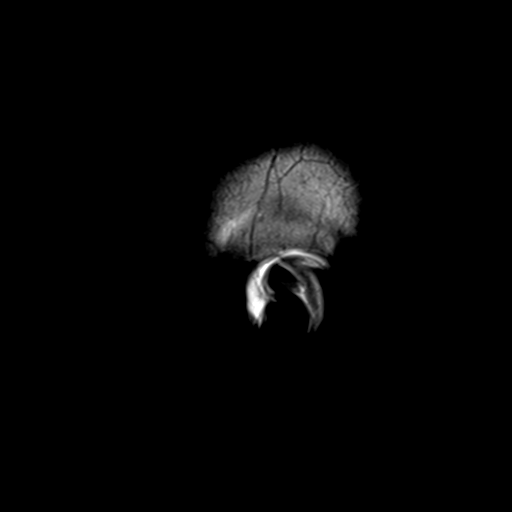
[im 23/23]
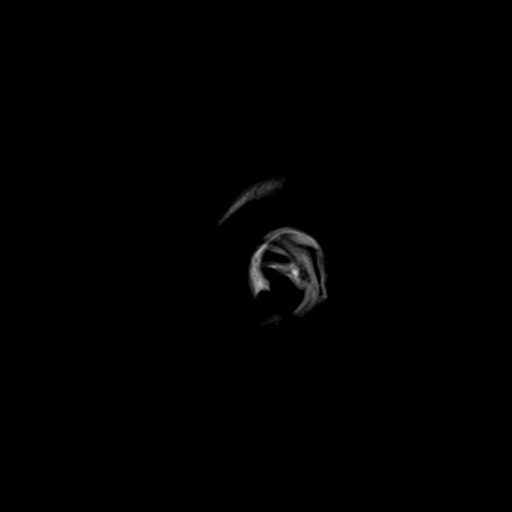

[Series 7: T2 · axial · 5.0mm · 0.72mm/px · 1 of 23 slices shown (1 of 2)]
[im 1/23]
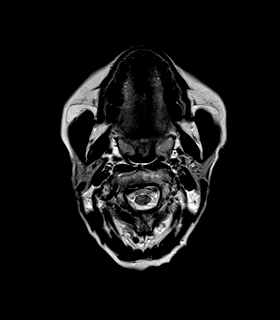

[Series 8: FLAIR · axial · 3.0mm · 0.45mm/px · z∈[-61,+100]mm · 3 of 55 slices shown (1 of 2)]
[im 1/55]
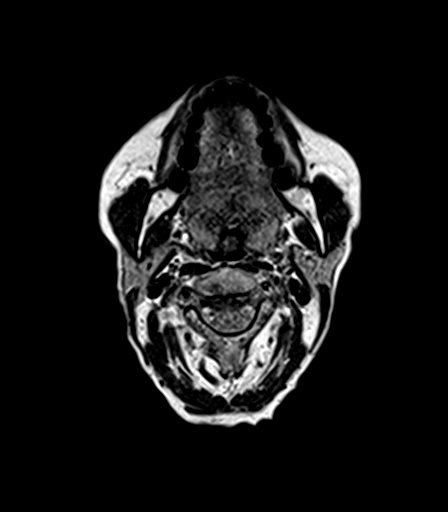
[im 28/55]
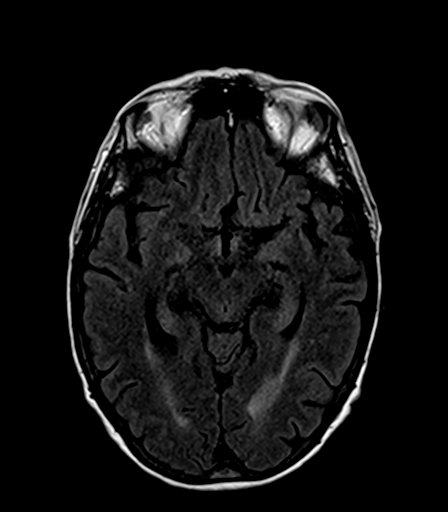
[im 55/55]
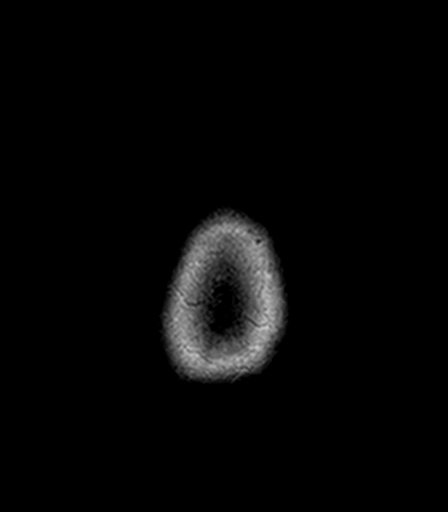

[Series 9: T2 · axial · 5.0mm · 0.72mm/px · 1 of 23 slices shown (2 of 2)]
[im 1/23]
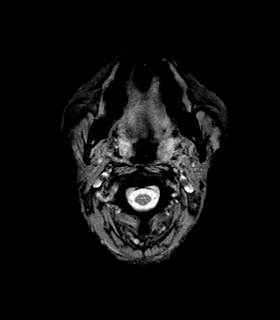

[Series 10: FLAIR · sagittal · 4.0mm · 0.45mm/px · 2 of 30 slices shown (2 of 2)]
[im 1/30]
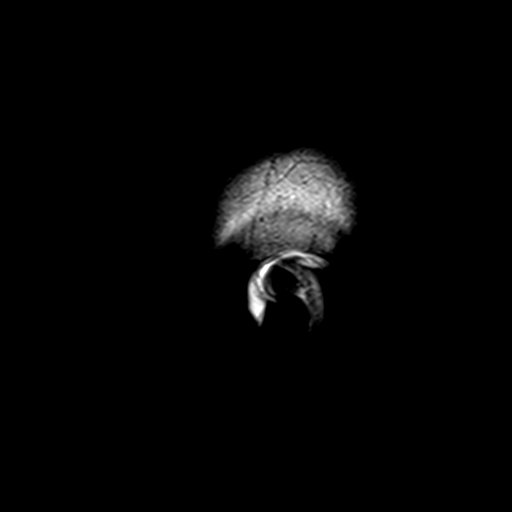
[im 30/30]
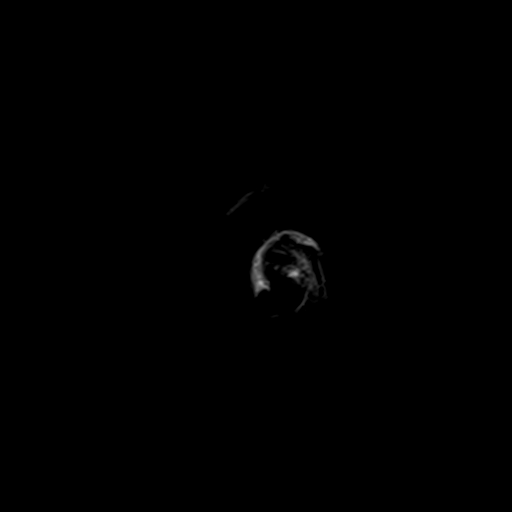

[Series 11: T1 · axial · 1.0mm · 1.00mm/px · z∈[-57,+100]mm · 9 of 160 slices shown (2 of 2)]
[im 1/160]
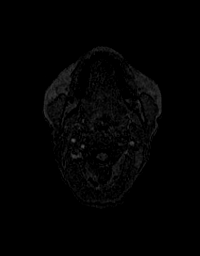
[im 18/160]
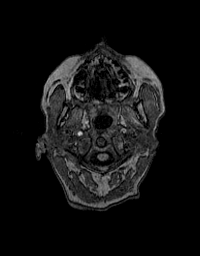
[im 36/160]
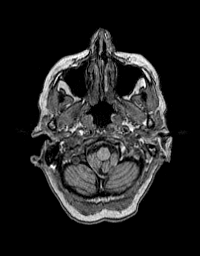
[im 54/160]
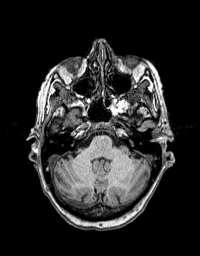
[im 71/160]
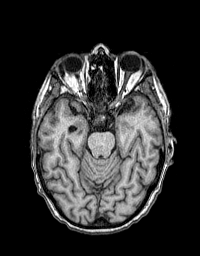
[im 89/160]
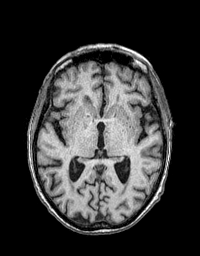
[im 107/160]
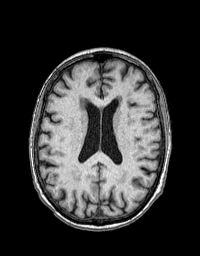
[im 142/160]
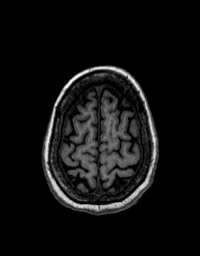
[im 160/160]
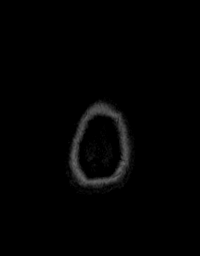

[Series 12: T2 post-contrast · coronal · 5.0mm · 0.43mm/px · 2 of 27 slices shown]
[im 1/27]
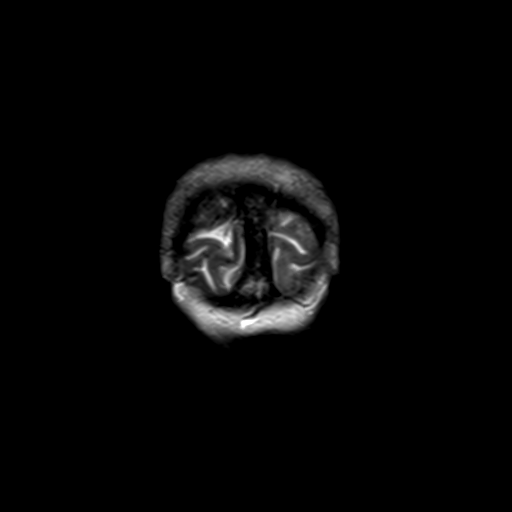
[im 27/27]
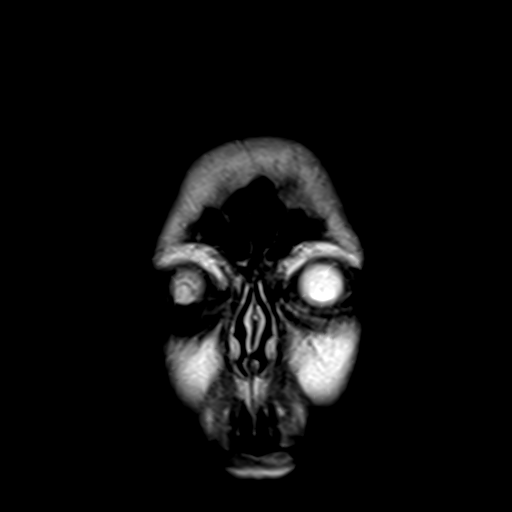

[Series 13: T1 post-contrast · axial · 1.0mm · 1.00mm/px · z∈[-57,+100]mm · 10 of 160 slices shown (1 of 3)]
[im 1/160]
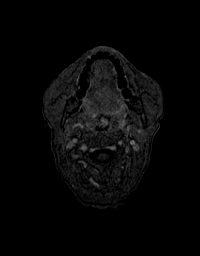
[im 18/160]
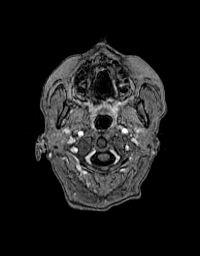
[im 36/160]
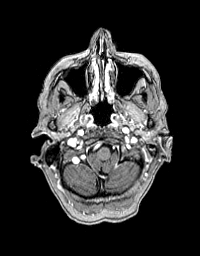
[im 54/160]
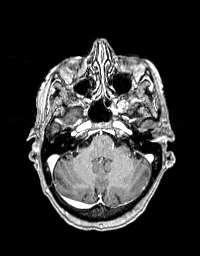
[im 71/160]
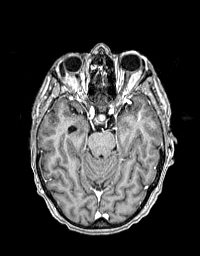
[im 89/160]
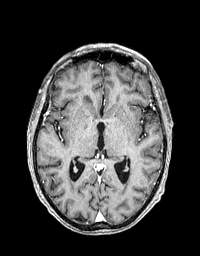
[im 107/160]
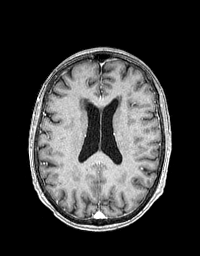
[im 124/160]
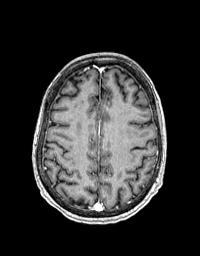
[im 142/160]
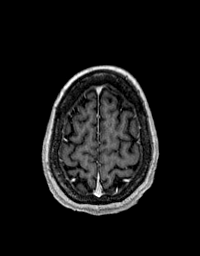
[im 160/160]
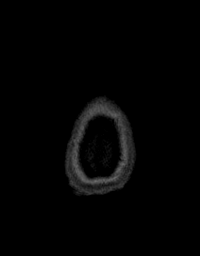

[Series 14: T1 post-contrast · coronal · 5.0mm · 0.43mm/px · 2 of 27 slices shown (2 of 3)]
[im 1/27]
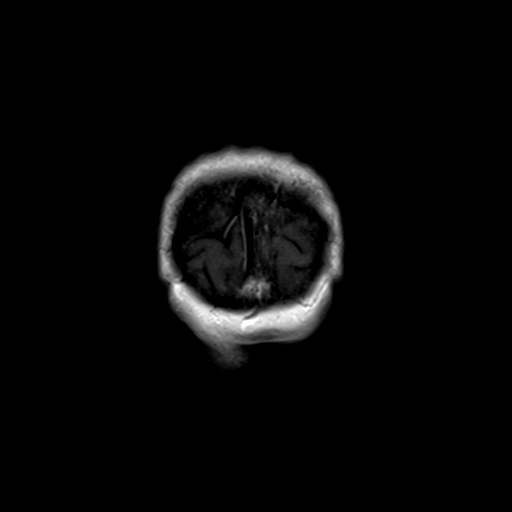
[im 27/27]
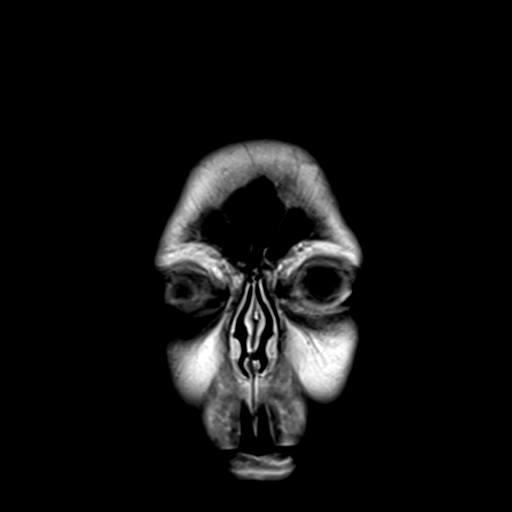

[Series 16: T1 post-contrast · sagittal · 5.0mm · 0.45mm/px · 1 of 23 slices shown (3 of 3)]
[im 1/23]
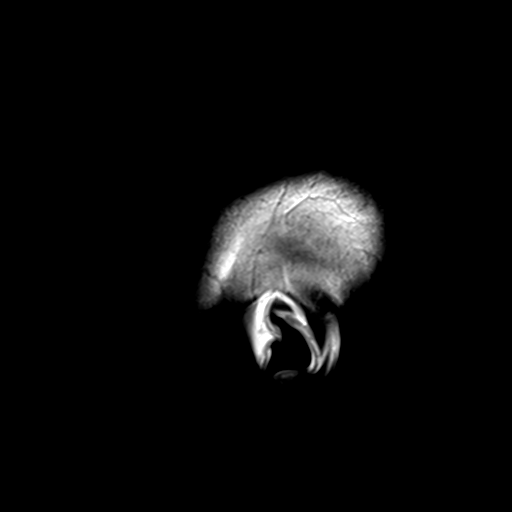

[Series 100: DWI · axial · 3.0mm · 1.20mm/px · z∈[-61,+99]mm · 3 of 55 slices shown (3 of 4)]
[im 1/55]
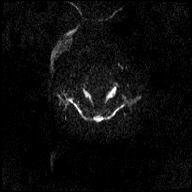
[im 28/55]
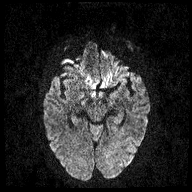
[im 55/55]
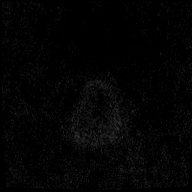

[Series 101: DWI · coronal · 3.0mm · 1.15mm/px · 3 of 48 slices shown (4 of 4)]
[im 1/48]
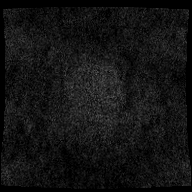
[im 24/48]
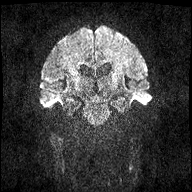
[im 48/48]
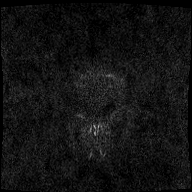

[47 of 48 positions shown; findings below may reference images not displayed]

FINDINGS: BRAIN: No acute infarct, acute hemorrhage or extra-axial collection.
Early confluent hyperintense T2-weighted signal of the
periventricular and deep white matter, most commonly due to chronic
ischemic microangiopathy. Normal volume of brain parenchyma and CSF
spaces. Midline structures are normal.

VASCULAR: Major flow voids are preserved. Susceptibility-sensitive
sequences show no chronic microhemorrhage or superficial siderosis.

SKULL AND UPPER CERVICAL SPINE: Normal calvarium and skull base.
Visualized upper cervical spine and soft tissues are normal.

SINUSES/ORBITS: No fluid levels or advanced mucosal thickening. No
mastoid or middle ear effusion. Normal orbits.
IMPRESSION: 1. No acute intracranial abnormality.
2. Findings of chronic small vessel ischemia.

## 2020-07-31 DIAGNOSIS — E1010 Type 1 diabetes mellitus with ketoacidosis without coma: Secondary | ICD-10-CM | POA: Diagnosis not present

## 2020-07-31 DIAGNOSIS — K5730 Diverticulosis of large intestine without perforation or abscess without bleeding: Secondary | ICD-10-CM | POA: Diagnosis not present

## 2020-07-31 DIAGNOSIS — I480 Paroxysmal atrial fibrillation: Secondary | ICD-10-CM | POA: Diagnosis not present

## 2020-07-31 DIAGNOSIS — R112 Nausea with vomiting, unspecified: Secondary | ICD-10-CM | POA: Diagnosis not present

## 2020-07-31 DIAGNOSIS — R109 Unspecified abdominal pain: Secondary | ICD-10-CM | POA: Diagnosis not present

## 2020-07-31 DIAGNOSIS — E785 Hyperlipidemia, unspecified: Secondary | ICD-10-CM | POA: Diagnosis not present

## 2020-07-31 DIAGNOSIS — Z809 Family history of malignant neoplasm, unspecified: Secondary | ICD-10-CM | POA: Diagnosis not present

## 2020-07-31 DIAGNOSIS — Z8249 Family history of ischemic heart disease and other diseases of the circulatory system: Secondary | ICD-10-CM | POA: Diagnosis not present

## 2020-07-31 DIAGNOSIS — R0902 Hypoxemia: Secondary | ICD-10-CM | POA: Diagnosis not present

## 2020-07-31 DIAGNOSIS — E1165 Type 2 diabetes mellitus with hyperglycemia: Secondary | ICD-10-CM | POA: Diagnosis not present

## 2020-07-31 DIAGNOSIS — I959 Hypotension, unspecified: Secondary | ICD-10-CM | POA: Diagnosis not present

## 2020-07-31 DIAGNOSIS — Z7901 Long term (current) use of anticoagulants: Secondary | ICD-10-CM | POA: Diagnosis not present

## 2020-07-31 DIAGNOSIS — R531 Weakness: Secondary | ICD-10-CM | POA: Diagnosis not present

## 2020-07-31 DIAGNOSIS — Z888 Allergy status to other drugs, medicaments and biological substances status: Secondary | ICD-10-CM | POA: Diagnosis not present

## 2020-07-31 DIAGNOSIS — Z881 Allergy status to other antibiotic agents status: Secondary | ICD-10-CM | POA: Diagnosis not present

## 2020-07-31 DIAGNOSIS — E039 Hypothyroidism, unspecified: Secondary | ICD-10-CM | POA: Diagnosis not present

## 2020-07-31 DIAGNOSIS — K760 Fatty (change of) liver, not elsewhere classified: Secondary | ICD-10-CM | POA: Diagnosis not present

## 2020-07-31 DIAGNOSIS — R110 Nausea: Secondary | ICD-10-CM | POA: Diagnosis not present

## 2020-07-31 DIAGNOSIS — Z794 Long term (current) use of insulin: Secondary | ICD-10-CM | POA: Diagnosis not present

## 2020-07-31 DIAGNOSIS — R1110 Vomiting, unspecified: Secondary | ICD-10-CM | POA: Diagnosis not present

## 2020-07-31 DIAGNOSIS — I5023 Acute on chronic systolic (congestive) heart failure: Secondary | ICD-10-CM | POA: Diagnosis not present

## 2020-07-31 DIAGNOSIS — I6782 Cerebral ischemia: Secondary | ICD-10-CM | POA: Diagnosis not present

## 2020-07-31 DIAGNOSIS — Z87891 Personal history of nicotine dependence: Secondary | ICD-10-CM | POA: Diagnosis not present

## 2020-07-31 DIAGNOSIS — R1084 Generalized abdominal pain: Secondary | ICD-10-CM | POA: Diagnosis not present

## 2020-07-31 DIAGNOSIS — Z8673 Personal history of transient ischemic attack (TIA), and cerebral infarction without residual deficits: Secondary | ICD-10-CM | POA: Diagnosis not present

## 2020-07-31 DIAGNOSIS — I4891 Unspecified atrial fibrillation: Secondary | ICD-10-CM | POA: Diagnosis not present

## 2020-07-31 DIAGNOSIS — R197 Diarrhea, unspecified: Secondary | ICD-10-CM | POA: Diagnosis not present

## 2020-07-31 DIAGNOSIS — I10 Essential (primary) hypertension: Secondary | ICD-10-CM | POA: Diagnosis not present

## 2020-07-31 DIAGNOSIS — Z79899 Other long term (current) drug therapy: Secondary | ICD-10-CM | POA: Diagnosis not present

## 2020-07-31 DIAGNOSIS — R4182 Altered mental status, unspecified: Secondary | ICD-10-CM | POA: Diagnosis not present

## 2020-07-31 DIAGNOSIS — I110 Hypertensive heart disease with heart failure: Secondary | ICD-10-CM | POA: Diagnosis not present

## 2020-07-31 LAB — HEMOGLOBIN A1C: Hemoglobin A1C: 8.8

## 2020-08-01 DIAGNOSIS — I480 Paroxysmal atrial fibrillation: Secondary | ICD-10-CM | POA: Diagnosis not present

## 2020-08-01 DIAGNOSIS — E1010 Type 1 diabetes mellitus with ketoacidosis without coma: Secondary | ICD-10-CM | POA: Diagnosis not present

## 2020-08-01 DIAGNOSIS — I10 Essential (primary) hypertension: Secondary | ICD-10-CM | POA: Diagnosis not present

## 2020-08-01 DIAGNOSIS — R197 Diarrhea, unspecified: Secondary | ICD-10-CM | POA: Diagnosis not present

## 2020-08-01 DIAGNOSIS — R112 Nausea with vomiting, unspecified: Secondary | ICD-10-CM | POA: Diagnosis not present

## 2020-08-03 DIAGNOSIS — E1049 Type 1 diabetes mellitus with other diabetic neurological complication: Secondary | ICD-10-CM | POA: Diagnosis not present

## 2020-08-03 DIAGNOSIS — E1065 Type 1 diabetes mellitus with hyperglycemia: Secondary | ICD-10-CM | POA: Diagnosis not present

## 2020-08-03 DIAGNOSIS — E1022 Type 1 diabetes mellitus with diabetic chronic kidney disease: Secondary | ICD-10-CM | POA: Diagnosis not present

## 2020-08-03 DIAGNOSIS — N183 Chronic kidney disease, stage 3 unspecified: Secondary | ICD-10-CM | POA: Diagnosis not present

## 2020-08-03 DIAGNOSIS — E1021 Type 1 diabetes mellitus with diabetic nephropathy: Secondary | ICD-10-CM | POA: Diagnosis not present

## 2020-08-03 DIAGNOSIS — Z9641 Presence of insulin pump (external) (internal): Secondary | ICD-10-CM | POA: Diagnosis not present

## 2020-08-23 DIAGNOSIS — H4041X1 Glaucoma secondary to eye inflammation, right eye, mild stage: Secondary | ICD-10-CM | POA: Diagnosis not present

## 2020-08-23 DIAGNOSIS — H3581 Retinal edema: Secondary | ICD-10-CM | POA: Diagnosis not present

## 2020-08-23 DIAGNOSIS — Z961 Presence of intraocular lens: Secondary | ICD-10-CM | POA: Diagnosis not present

## 2020-08-23 DIAGNOSIS — H30031 Focal chorioretinal inflammation, peripheral, right eye: Secondary | ICD-10-CM | POA: Diagnosis not present

## 2020-08-23 DIAGNOSIS — H209 Unspecified iridocyclitis: Secondary | ICD-10-CM | POA: Diagnosis not present

## 2020-08-23 DIAGNOSIS — H353221 Exudative age-related macular degeneration, left eye, with active choroidal neovascularization: Secondary | ICD-10-CM | POA: Diagnosis not present

## 2020-08-23 DIAGNOSIS — H353212 Exudative age-related macular degeneration, right eye, with inactive choroidal neovascularization: Secondary | ICD-10-CM | POA: Diagnosis not present

## 2020-08-23 DIAGNOSIS — Z79899 Other long term (current) drug therapy: Secondary | ICD-10-CM | POA: Diagnosis not present

## 2020-08-23 DIAGNOSIS — H401120 Primary open-angle glaucoma, left eye, stage unspecified: Secondary | ICD-10-CM | POA: Diagnosis not present

## 2020-08-23 DIAGNOSIS — H44111 Panuveitis, right eye: Secondary | ICD-10-CM | POA: Diagnosis not present

## 2020-09-06 ENCOUNTER — Encounter: Payer: Self-pay | Admitting: Family Medicine

## 2020-09-06 ENCOUNTER — Other Ambulatory Visit: Payer: Self-pay

## 2020-09-06 ENCOUNTER — Ambulatory Visit (INDEPENDENT_AMBULATORY_CARE_PROVIDER_SITE_OTHER): Payer: Medicare Other | Admitting: Family Medicine

## 2020-09-06 DIAGNOSIS — I4891 Unspecified atrial fibrillation: Secondary | ICD-10-CM | POA: Diagnosis not present

## 2020-09-06 DIAGNOSIS — E1069 Type 1 diabetes mellitus with other specified complication: Secondary | ICD-10-CM

## 2020-09-06 DIAGNOSIS — E1139 Type 2 diabetes mellitus with other diabetic ophthalmic complication: Secondary | ICD-10-CM | POA: Diagnosis not present

## 2020-09-06 DIAGNOSIS — I152 Hypertension secondary to endocrine disorders: Secondary | ICD-10-CM | POA: Diagnosis not present

## 2020-09-06 DIAGNOSIS — E1165 Type 2 diabetes mellitus with hyperglycemia: Secondary | ICD-10-CM | POA: Diagnosis not present

## 2020-09-06 DIAGNOSIS — IMO0002 Reserved for concepts with insufficient information to code with codable children: Secondary | ICD-10-CM

## 2020-09-06 DIAGNOSIS — E785 Hyperlipidemia, unspecified: Secondary | ICD-10-CM

## 2020-09-06 DIAGNOSIS — E1159 Type 2 diabetes mellitus with other circulatory complications: Secondary | ICD-10-CM | POA: Diagnosis not present

## 2020-09-06 DIAGNOSIS — E1149 Type 2 diabetes mellitus with other diabetic neurological complication: Secondary | ICD-10-CM | POA: Diagnosis not present

## 2020-09-06 NOTE — Progress Notes (Signed)
Carmen Cooper - 80 y.o. female MRN [id number]  Date of birth: 04/25/1940  Subjective Chief Complaint  Patient presents with   Hyperlipidemia   Hypertension    HPI Carmen Cooper is an 80 year old female here today for follow-up visit.  She has history of type 2 diabetes managed as type I with insulin pump.  She does see endocrinology for this.  Reports her blood sugars have been brittle recently ranging from 50 to over 400.  She has not really had too many symptoms with her fluctuating blood sugars.  She does report that she has follow-up with her endocrinologist in a couple weeks.  She sees Dr. Curt Bears for management of her atrial fibrillation and hypertension.  She does report noticing a little more fluttering recently however denies shortness of breath, dizziness or increased fatigue.  She has had cardioversion in the past.  She is currently on lisinopril for management of hypertension.  She is not on any beta-blocker or calcium channel blocker at this time.  She is anticoagulated with Eliquis.  She continues to have difficulties with her vision.  She has uveitic glaucoma as well as macular degeneration.  She is seeing ophthalmology through [facility].  ROS:  A comprehensive ROS was completed and negative except as noted per HPI   Allergies  Allergen Reactions   Dexamethasone Anaphylaxis and Other (See Comments)    Blood sugar elevated     Brimonidine Tartrate Other (See Comments)    Burning and redness    Clindamycin/Lincomycin Rash   Sulfa Antibiotics Rash   Valacyclovir Hcl Rash    Past Medical History:  Diagnosis Date   Atrial fibrillation ([place])    BCC (basal cell carcinoma of skin)    Diabetes ([place])    Glaucoma    History of TIA (transient ischemic attack) 08/11/2013   12/2012 - [name]    Hypertension    Hypothyroidism 08/11/2013   Memory changes    Microscopic colitis 08/21/2013   2008 - [facility] [name].  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, [facility]     Past Surgical History:  Procedure Laterality Date   CARDIOVERSION N/A 02/12/2019   Procedure: CARDIOVERSION;  Surgeon: [name], MD;  Location: [facility] END[facility];  Service: Cardiovascular;  Laterality: N/A;   MOHS SURGERY  2019   Nose bcc    OTHER SURGICAL HISTORY  04/01/2019   biopsy on nose and lip     Social History   Socioeconomic History   Marital status: Married    Spouse name: [name]   Number of children: 1   Years of education: 12   Highest education level: 12th grade  Occupational History   Occupation: Retired    Comment: retired  Tobacco Use   Smoking status: Former    Packs/day: 0.25    Years: 20.00    Pack years: 5.00    Types: Cigarettes   Smokeless tobacco: Never  Vaping Use   Vaping Use: Never used  Substance and Sexual Activity   Alcohol use: Yes    Alcohol/week: 2.0 standard drinks    Types: 2 Shots of liquor per week    Comment: 2-3 a day   Drug use: No   Sexual activity: Not Currently    Partners: Male  Other Topics Concern   Not on file  Social History Narrative   Patient takes care of her husband who  has dementia. Doesn't get out much. Drinks 2-3 cups of hot tea daily.   Right-handed.   Social Determinants of Health   Financial Resource Strain: Not on file  Food Insecurity: Not on file  Transportation Needs: Not on file  Physical Activity: Not on file  Stress: Not on file  Social Connections: Not on file    Family History  Problem Relation Age of Onset   Heart disease Son    Hypertension Mother    Cancer Mother        unsure of origin   Heart attack Father    Diabetes Neg Hx     Health Maintenance  Topic Date Due   Zoster Vaccines- Shingrix (1 of 2) Never done   FOOT EXAM  04/04/2019   COVID-19 Vaccine (4 - Booster for [place] series) 02/25/2020   INFLUENZA VACCINE  09/13/2020   HEMOGLOBIN A1C  01/30/2021   OPHTHALMOLOGY EXAM  07/21/2021    TETANUS/TDAP  07/25/2025   DEXA SCAN  Completed   PNA vac Low Risk Adult  Completed   HPV VACCINES  Aged Out     ----------------------------------------------------------------------------------------------------------------------------------------------------------------------------------------------------------------- Physical Exam BP (!) 145/72 (BP Location: Left Arm, Patient Position: Sitting, Cuff Size: Small)   Pulse 63   Ht '5\' 5"'$  (1.651 m)   Wt 125 lb (56.7 kg)   SpO2 97%   BMI 20.80 kg/m   Physical Exam Constitutional:      Appearance: Normal appearance.  HENT:     Head: Normocephalic and atraumatic.  Eyes:     General: No scleral icterus. Cardiovascular:     Rate and Rhythm: Normal rate and regular rhythm.  Pulmonary:     Effort: Pulmonary effort is normal.     Breath sounds: Normal breath sounds.  Musculoskeletal:     Cervical back: Normal range of motion and neck supple.  Skin:    General: Skin is warm and dry.  Neurological:     General: No focal deficit present.     Mental Status: She is alert.  Psychiatric:        Mood and Affect: Mood normal.        Behavior: Behavior normal.    ------------------------------------------------------------------------------------------------------------------------------------------------------------------------------------------------------------------- Assessment and Plan  Hypertension associated with diabetes ([place]) Blood pressure mildly elevated today.  She is doing well with lisinopril at this time which is managed by her cardiologist.  Atrial fibrillation ([place]) Occasional fluttering however no other symptoms at this time.  She will let me know if she becomes more symptomatic.  Uncontrolled diabetes mellitus with neurologic complication ([place]) Brittle diabetes which is not well controlled.  She will continue to follow with endocrinology for pump management.  She is aware of symptoms of hypoglycemia and how to  correct this.  Uncontrolled diabetes mellitus with eye complications [facility]) This is managed by Dr. Manuella Ghazi at [facility].  Dyslipidemia due to type 1 diabetes mellitus [facility]) She is doing well with atorvastatin.  Continue on current strength.   No orders of the defined types were placed in this encounter.   Return in about 6 months (around 03/09/2021) for HTN.    This visit occurred during the SARS-CoV-2 public health emergency.  Safety protocols were in place, including screening questions prior to the visit, additional usage of staff PPE, and extensive cleaning of exam room while observing appropriate contact time as indicated for disinfecting solutions.

## 2020-09-06 NOTE — Assessment & Plan Note (Signed)
Blood pressure mildly elevated today.  She is doing well with lisinopril at this time which is managed by her cardiologist.

## 2020-09-06 NOTE — Assessment & Plan Note (Signed)
Occasional fluttering however no other symptoms at this time.  She will let me know if she becomes more symptomatic.

## 2020-09-06 NOTE — Assessment & Plan Note (Signed)
Brittle diabetes which is not well controlled.  She will continue to follow with endocrinology for pump management.  She is aware of symptoms of hypoglycemia and how to correct this.

## 2020-09-06 NOTE — Assessment & Plan Note (Signed)
This is managed by Dr. Manuella Ghazi at [facility].

## 2020-09-06 NOTE — Assessment & Plan Note (Signed)
She is doing well with atorvastatin.  Continue on current strength.

## 2020-09-06 NOTE — Patient Instructions (Addendum)
Very nice to see you today! Please continue current medications.  See me again in 6 months or sooner if needed.

## 2020-09-23 DIAGNOSIS — Z7952 Long term (current) use of systemic steroids: Secondary | ICD-10-CM | POA: Diagnosis not present

## 2020-09-23 DIAGNOSIS — H209 Unspecified iridocyclitis: Secondary | ICD-10-CM | POA: Diagnosis not present

## 2020-09-23 DIAGNOSIS — H4041X1 Glaucoma secondary to eye inflammation, right eye, mild stage: Secondary | ICD-10-CM | POA: Diagnosis not present

## 2020-09-23 DIAGNOSIS — H44111 Panuveitis, right eye: Secondary | ICD-10-CM | POA: Diagnosis not present

## 2020-09-23 DIAGNOSIS — Z79899 Other long term (current) drug therapy: Secondary | ICD-10-CM | POA: Diagnosis not present

## 2020-09-23 DIAGNOSIS — H353212 Exudative age-related macular degeneration, right eye, with inactive choroidal neovascularization: Secondary | ICD-10-CM | POA: Diagnosis not present

## 2020-09-23 DIAGNOSIS — H401120 Primary open-angle glaucoma, left eye, stage unspecified: Secondary | ICD-10-CM | POA: Diagnosis not present

## 2020-09-23 DIAGNOSIS — H3581 Retinal edema: Secondary | ICD-10-CM | POA: Diagnosis not present

## 2020-09-23 DIAGNOSIS — H353221 Exudative age-related macular degeneration, left eye, with active choroidal neovascularization: Secondary | ICD-10-CM | POA: Diagnosis not present

## 2020-09-23 DIAGNOSIS — Z961 Presence of intraocular lens: Secondary | ICD-10-CM | POA: Diagnosis not present

## 2020-09-23 DIAGNOSIS — H30031 Focal chorioretinal inflammation, peripheral, right eye: Secondary | ICD-10-CM | POA: Diagnosis not present

## 2020-10-05 DIAGNOSIS — Z23 Encounter for immunization: Secondary | ICD-10-CM | POA: Diagnosis not present

## 2020-10-05 DIAGNOSIS — E1021 Type 1 diabetes mellitus with diabetic nephropathy: Secondary | ICD-10-CM | POA: Diagnosis not present

## 2020-10-05 DIAGNOSIS — E1065 Type 1 diabetes mellitus with hyperglycemia: Secondary | ICD-10-CM | POA: Diagnosis not present

## 2020-10-18 DIAGNOSIS — Z20822 Contact with and (suspected) exposure to covid-19: Secondary | ICD-10-CM | POA: Diagnosis not present

## 2020-10-21 DIAGNOSIS — H3581 Retinal edema: Secondary | ICD-10-CM | POA: Diagnosis not present

## 2020-10-21 DIAGNOSIS — H401120 Primary open-angle glaucoma, left eye, stage unspecified: Secondary | ICD-10-CM | POA: Diagnosis not present

## 2020-10-21 DIAGNOSIS — H4041X1 Glaucoma secondary to eye inflammation, right eye, mild stage: Secondary | ICD-10-CM | POA: Diagnosis not present

## 2020-10-21 DIAGNOSIS — H353212 Exudative age-related macular degeneration, right eye, with inactive choroidal neovascularization: Secondary | ICD-10-CM | POA: Diagnosis not present

## 2020-10-21 DIAGNOSIS — H44111 Panuveitis, right eye: Secondary | ICD-10-CM | POA: Diagnosis not present

## 2020-10-21 DIAGNOSIS — H30031 Focal chorioretinal inflammation, peripheral, right eye: Secondary | ICD-10-CM | POA: Diagnosis not present

## 2020-10-21 DIAGNOSIS — Z79899 Other long term (current) drug therapy: Secondary | ICD-10-CM | POA: Diagnosis not present

## 2020-10-21 DIAGNOSIS — H209 Unspecified iridocyclitis: Secondary | ICD-10-CM | POA: Diagnosis not present

## 2020-10-21 DIAGNOSIS — Z961 Presence of intraocular lens: Secondary | ICD-10-CM | POA: Diagnosis not present

## 2020-10-21 DIAGNOSIS — H353221 Exudative age-related macular degeneration, left eye, with active choroidal neovascularization: Secondary | ICD-10-CM | POA: Diagnosis not present

## 2020-10-25 DIAGNOSIS — H4041X1 Glaucoma secondary to eye inflammation, right eye, mild stage: Secondary | ICD-10-CM | POA: Diagnosis not present

## 2020-10-25 DIAGNOSIS — H209 Unspecified iridocyclitis: Secondary | ICD-10-CM | POA: Diagnosis not present

## 2020-10-25 DIAGNOSIS — H30031 Focal chorioretinal inflammation, peripheral, right eye: Secondary | ICD-10-CM | POA: Diagnosis not present

## 2020-10-25 DIAGNOSIS — Z947 Corneal transplant status: Secondary | ICD-10-CM | POA: Diagnosis not present

## 2020-10-25 DIAGNOSIS — H401120 Primary open-angle glaucoma, left eye, stage unspecified: Secondary | ICD-10-CM | POA: Diagnosis not present

## 2020-10-25 DIAGNOSIS — B0052 Herpesviral keratitis: Secondary | ICD-10-CM | POA: Diagnosis not present

## 2020-10-25 DIAGNOSIS — H1820 Unspecified corneal edema: Secondary | ICD-10-CM | POA: Diagnosis not present

## 2020-10-26 DIAGNOSIS — E10649 Type 1 diabetes mellitus with hypoglycemia without coma: Secondary | ICD-10-CM | POA: Diagnosis not present

## 2020-10-30 ENCOUNTER — Telehealth: Payer: Self-pay

## 2020-10-30 ENCOUNTER — Telehealth: Payer: Self-pay | Admitting: Family Medicine

## 2020-10-30 NOTE — Telephone Encounter (Signed)
Left message for patient to call back and schedule Medicare Annual Wellness Visit (AWV) either virtually or in office. I gave # [phone]   Last AWV 02/12/18 please schedule at anytime with health coach

## 2020-11-01 DIAGNOSIS — E039 Hypothyroidism, unspecified: Secondary | ICD-10-CM | POA: Diagnosis not present

## 2020-11-02 ENCOUNTER — Encounter: Payer: Self-pay | Admitting: Family Medicine

## 2020-11-02 ENCOUNTER — Ambulatory Visit (INDEPENDENT_AMBULATORY_CARE_PROVIDER_SITE_OTHER): Payer: Medicare Other | Admitting: Family Medicine

## 2020-11-02 ENCOUNTER — Other Ambulatory Visit: Payer: Self-pay | Admitting: Family Medicine

## 2020-11-02 ENCOUNTER — Ambulatory Visit (INDEPENDENT_AMBULATORY_CARE_PROVIDER_SITE_OTHER): Payer: Medicare Other

## 2020-11-02 VITALS — BP 137/79 | HR 68 | Resp 17

## 2020-11-02 DIAGNOSIS — R5383 Other fatigue: Secondary | ICD-10-CM

## 2020-11-02 DIAGNOSIS — R002 Palpitations: Secondary | ICD-10-CM

## 2020-11-02 DIAGNOSIS — I4891 Unspecified atrial fibrillation: Secondary | ICD-10-CM

## 2020-11-02 DIAGNOSIS — I471 Supraventricular tachycardia: Secondary | ICD-10-CM

## 2020-11-02 NOTE — Progress Notes (Unsigned)
Patient enrolled for [employer] to mail a 3 day ZIO XT monitor to her address on file. Dr. Curt Bears to read.

## 2020-11-02 NOTE — Patient Instructions (Signed)
Normal EKG Ordering a Holter monitor - someone will be contacting you about this Checking blood work for other causes of palpitations and fatigue - we will let you know results Keep taking all medications as prescribed

## 2020-11-02 NOTE — Progress Notes (Signed)
Acute Office Visit  Subjective:    Patient ID: Carmen Cooper, female    DOB: [birth year], 80 y.o.   MRN: [id number]  Chief Complaint  Patient presents with   Atrial Fibrillation     Patient is in today for palpitations, history of A. fib.  Patient reports on Saturday she started feeling more fatigued than usual, fluttering/palpitations that would occasionally take her breath.  Reports she was having occasional sweating as well but also states her diabetes is not under the best control and tends to fluctuate.  Reports sugars yesterday and today have been good, 88-110.  She does have a history of intermittent A. fib with cardioversion in December 2020.  Reports that her episodes of A. fib typically do not last long as this 1 has.  She denies any chest pain, nausea, dizziness, GI or GU symptoms.  Past Medical History:  Diagnosis Date   Atrial fibrillation ([place])    BCC (basal cell carcinoma of skin)    Diabetes ([place])    Glaucoma    History of TIA (transient ischemic attack) 08/11/2013   12/2012 - [name]    Hypertension    Hypothyroidism 08/11/2013   Memory changes    Microscopic colitis 08/21/2013   2008 [facility] [name].  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, [facility]     Past Surgical History:  Procedure Laterality Date   CARDIOVERSION N/A 02/12/2019   Procedure: CARDIOVERSION;  Surgeon: [name], MD;  Location: [facility] ENDOSCOPY;  Service: Cardiovascular;  Laterality: N/A;   MOHS SURGERY  2019   Nose bcc    OTHER SURGICAL HISTORY  04/01/2019   biopsy on nose and lip     Family History  Problem Relation Age of Onset   Heart disease Son    Hypertension Mother    Cancer Mother        unsure of origin   Heart attack Father    Diabetes Neg Hx     Social History   Socioeconomic History   Marital status: Married    Spouse name: [name]   Number of children: 1    Years of education: 12   Highest education level: 12th grade  Occupational History   Occupation: Retired    Comment: retired  Tobacco Use   Smoking status: Former    Packs/day: 0.25    Years: 20.00    Pack years: 5.00    Types: Cigarettes   Smokeless tobacco: Never  Vaping Use   Vaping Use: Never used  Substance and Sexual Activity   Alcohol use: Yes    Alcohol/week: 2.0 standard drinks    Types: 2 Shots of liquor per week    Comment: 2-3 a day   Drug use: No   Sexual activity: Not Currently    Partners: Male  Other Topics Concern   Not on file  Social History Narrative   Patient takes care of her husband who has dementia. Doesn't get out much. Drinks 2-3 cups of hot tea daily.   Right-handed.   Social Determinants of Health   Financial Resource Strain: Not on file  Food Insecurity: Not on file  Transportation Needs: Not on file  Physical Activity: Not on file  Stress: Not on file  Social Connections: Not on file  Intimate Partner Violence: Not on file    Outpatient Medications Prior to Visit  Medication  Sig Dispense Refill   AMBULATORY NON FORMULARY MEDICATION Freestyle light test strips Test twice a day  Dx type 2 diabetes E11.9 100 each 11   apixaban (ELIQUIS) 2.5 MG TABS tablet Take 1 tablet (2.5 mg total) by mouth 2 (two) times daily. 180 tablet 1   atorvastatin (LIPITOR) 40 MG tablet Take 1 tablet (40 mg total) by mouth daily. 90 tablet 3   folic acid (FOLVITE) 1 MG tablet Take 1 mg by mouth daily.      FREESTYLE LITE test strip      Insulin Disposable Pump (OMNIPOD DASH 5 PACK PODS) MISC Inject into the skin.     insulin glargine (LANTUS SOLOSTAR) 100 UNIT/ML Solostar Pen Use in case of pump failure: Remove pump and take 15 units of Lantus insulin as basal insulin in case of insulin pump malfunction/failure. .Do not place pump back on until 20 hours AFTER last dose of Lantus to prevent "double basal infusion."     Insulin Human (INSULIN PUMP) SOLN Inject into  the skin. insulin lispro (HUMALOG) 100 UNIT/ML      insulin lispro (HUMALOG) 100 UNIT/ML injection Medtronic 630G pump.  Basal 12-6a 0.625, 6a-7p 0.725, 7p-12a 0.625.  Preset bolus:  4/5/6.  ISF 50.  Total daily dose:  40 units/day     Lifitegrast (XIIDRA) 5 % SOLN Place 1 drop into both eyes daily.      lisinopril (ZESTRIL) 20 MG tablet Take 0.5 tablets (10 mg total) by mouth daily. 45 tablet 3   Multiple Vitamins-Minerals (CENTRUM SILVER 50+WOMEN PO) Take 1 tablet by mouth daily.      mycophenolate (CELLCEPT) 500 MG tablet Take by mouth.     nepafenac (NEVANAC) 0.1 % ophthalmic suspension Place 1 drop into the right eye 3 (three) times daily.      ofloxacin (OCUFLOX) 0.3 % ophthalmic solution Place 1 drop into the right eye 4 times daily.     Omega-3 1000 MG CAPS Take 1,000 mg by mouth daily.      SURE COMFORT PEN NEEDLES 31G X 8 MM MISC      TIROSINT 100 MCG CAPS Take 1 capsule by mouth daily.     ZIOPTAN 0.0015 % SOLN Place 1 drop into both eyes at bedtime.      No facility-administered medications prior to visit.    Allergies  Allergen Reactions   Dexamethasone Anaphylaxis and Other (See Comments)    Blood sugar elevated     Brimonidine Tartrate Other (See Comments)    Burning and redness    Clindamycin/Lincomycin Rash   Sulfa Antibiotics Rash   Valacyclovir Hcl Rash    Review of Systems All review of systems negative except what is listed in the HPI     Objective:    Physical Exam Vitals reviewed.  Constitutional:      Appearance: Normal appearance.  Cardiovascular:     Rate and Rhythm: Normal rate and regular rhythm.     Pulses: Normal pulses.     Heart sounds: Normal heart sounds.     Comments: Occasional early beats heard Pulmonary:     Effort: Pulmonary effort is normal.     Breath sounds: Normal breath sounds.  Musculoskeletal:     Right lower leg: No edema.     Left lower leg: No edema.  Skin:    General: Skin is warm and dry.  Neurological:     Mental  Status: She is alert and oriented to person, place, and time.  Psychiatric:  Mood and Affect: Mood normal.        Behavior: Behavior normal.        Thought Content: Thought content normal.        Judgment: Judgment normal.    There were no vitals taken for this visit. Wt Readings from Last 3 Encounters:  09/06/20 125 lb (56.7 kg)  05/31/20 128 lb (58.1 kg)  03/09/20 124 lb 9.6 oz (56.5 kg)    Health Maintenance Due  Topic Date Due   Zoster Vaccines- Shingrix (1 of 2) Never done   FOOT EXAM  04/04/2019   COVID-19 Vaccine (4 - Booster for Pfizer series) 02/17/2020   INFLUENZA VACCINE  09/13/2020    There are no preventive care reminders to display for this patient.   Lab Results  Component Value Date   TSH 9.93 (H) 03/25/2019   Lab Results  Component Value Date   WBC 4.3 03/25/2019   HGB 11.1 (L) 03/25/2019   HCT 31.6 (L) 03/25/2019   MCV 115.8 (H) 03/25/2019   PLT 273 03/25/2019   Lab Results  Component Value Date   NA 138 03/25/2019   K 4.7 03/25/2019   CO2 32 03/25/2019   GLUCOSE 178 (H) 03/25/2019   BUN 21 03/25/2019   CREATININE 1.09 (H) 03/25/2019   BILITOT 0.9 03/25/2019   ALKPHOS 143 (A) 11/29/2018   AST 68 (H) 03/25/2019   ALT 51 (H) 03/25/2019   PROT 5.9 (L) 03/25/2019   ALBUMIN 3.8 12/06/2015   CALCIUM 8.8 03/25/2019   Lab Results  Component Value Date   CHOL 96 03/27/2017   Lab Results  Component Value Date   HDL 57 03/27/2017   Lab Results  Component Value Date   LDLCALC 28 03/27/2017   Lab Results  Component Value Date   TRIG 75 03/27/2017   Lab Results  Component Value Date   CHOLHDL 1.8 08/11/2013   Lab Results  Component Value Date   HGBA1C 8.8 07/31/2020       Assessment & Plan:   1. Atrial fibrillation, unspecified type (HCC) 2. Palpitations 3. Fatigue, unspecified type EKG: normal EKG, normal sinus rhythm.  Given her symptoms we will go ahead and order a 3-day Holter monitor.  Would also like to add on some  basic labs.  CBC added.  Patient reports endocrinologist checked TSH yesterday and she is following up with them on Friday (no visible results yet), therefore will not repeat today.  Encourage patient to continue medications as prescribed.  Will follow-up after Holter monitor results. Patient aware of signs/symptoms requiring further/urgent evaluation.   - CBC - EKG 12-Lead -Holter  Follow-up pending results or as needed.   Purcell Nails Olevia Bowens, DNP, FNP-C

## 2020-11-03 LAB — CBC
HCT: 42.1 % (ref 35.0–45.0)
Hemoglobin: 13.7 g/dL (ref 11.7–15.5)
MCH: 32.2 pg (ref 27.0–33.0)
MCHC: 32.5 g/dL (ref 32.0–36.0)
MCV: 99.1 fL (ref 80.0–100.0)
MPV: 10.6 fL (ref 7.5–12.5)
Platelets: 241 10*3/uL (ref 140–400)
RBC: 4.25 10*6/uL (ref 3.80–5.10)
RDW: 12.7 % (ref 11.0–15.0)
WBC: 5.4 10*3/uL (ref 3.8–10.8)

## 2020-11-04 DIAGNOSIS — R002 Palpitations: Secondary | ICD-10-CM | POA: Diagnosis not present

## 2020-11-04 DIAGNOSIS — R5383 Other fatigue: Secondary | ICD-10-CM | POA: Diagnosis not present

## 2020-11-05 DIAGNOSIS — E039 Hypothyroidism, unspecified: Secondary | ICD-10-CM | POA: Diagnosis not present

## 2020-11-09 ENCOUNTER — Other Ambulatory Visit: Payer: Self-pay | Admitting: Osteopathic Medicine

## 2020-11-10 DIAGNOSIS — R5383 Other fatigue: Secondary | ICD-10-CM | POA: Diagnosis not present

## 2020-11-10 DIAGNOSIS — H353232 Exudative age-related macular degeneration, bilateral, with inactive choroidal neovascularization: Secondary | ICD-10-CM | POA: Diagnosis not present

## 2020-11-10 DIAGNOSIS — R002 Palpitations: Secondary | ICD-10-CM | POA: Diagnosis not present

## 2020-11-10 DIAGNOSIS — H524 Presbyopia: Secondary | ICD-10-CM | POA: Diagnosis not present

## 2020-11-12 ENCOUNTER — Other Ambulatory Visit: Payer: Self-pay | Admitting: Family Medicine

## 2020-11-12 DIAGNOSIS — R002 Palpitations: Secondary | ICD-10-CM

## 2020-11-12 MED ORDER — DILTIAZEM HCL ER 60 MG PO CP12: 60.0000 mg | ORAL_CAPSULE | Freq: Two times a day (BID) | ORAL | 1 refills | Status: DC

## 2020-11-16 ENCOUNTER — Other Ambulatory Visit: Payer: Self-pay

## 2020-11-16 ENCOUNTER — Telehealth: Payer: Self-pay

## 2020-11-16 DIAGNOSIS — R002 Palpitations: Secondary | ICD-10-CM

## 2020-11-16 MED ORDER — DILTIAZEM HCL ER 60 MG PO CP12: ORAL_CAPSULE | ORAL | 0 refills | Status: DC

## 2020-11-16 NOTE — Telephone Encounter (Signed)
Carmen Cooper called requesting information pertaining to the Heart Monitor.   Spoke to Carmen Cooper and he admitted to not seeing the result message sent to Carmen via [place] from [place].   Contacted Carmen Cooper and advised that Carmen Cooper had information from both the Cardiologist and Carmen Cooper.

## 2020-11-22 DIAGNOSIS — H4043X3 Glaucoma secondary to eye inflammation, bilateral, severe stage: Secondary | ICD-10-CM | POA: Diagnosis not present

## 2020-11-22 DIAGNOSIS — H209 Unspecified iridocyclitis: Secondary | ICD-10-CM | POA: Diagnosis not present

## 2020-11-25 DIAGNOSIS — H3581 Retinal edema: Secondary | ICD-10-CM | POA: Diagnosis not present

## 2020-11-25 DIAGNOSIS — H4041X1 Glaucoma secondary to eye inflammation, right eye, mild stage: Secondary | ICD-10-CM | POA: Diagnosis not present

## 2020-11-25 DIAGNOSIS — H30031 Focal chorioretinal inflammation, peripheral, right eye: Secondary | ICD-10-CM | POA: Diagnosis not present

## 2020-11-25 DIAGNOSIS — H401120 Primary open-angle glaucoma, left eye, stage unspecified: Secondary | ICD-10-CM | POA: Diagnosis not present

## 2020-11-25 DIAGNOSIS — H353221 Exudative age-related macular degeneration, left eye, with active choroidal neovascularization: Secondary | ICD-10-CM | POA: Diagnosis not present

## 2020-11-25 DIAGNOSIS — Z79899 Other long term (current) drug therapy: Secondary | ICD-10-CM | POA: Diagnosis not present

## 2020-11-25 DIAGNOSIS — H209 Unspecified iridocyclitis: Secondary | ICD-10-CM | POA: Diagnosis not present

## 2020-11-25 DIAGNOSIS — H353212 Exudative age-related macular degeneration, right eye, with inactive choroidal neovascularization: Secondary | ICD-10-CM | POA: Diagnosis not present

## 2020-11-25 DIAGNOSIS — Z961 Presence of intraocular lens: Secondary | ICD-10-CM | POA: Diagnosis not present

## 2020-11-25 DIAGNOSIS — H44111 Panuveitis, right eye: Secondary | ICD-10-CM | POA: Diagnosis not present

## 2020-12-13 ENCOUNTER — Other Ambulatory Visit: Payer: Self-pay

## 2020-12-13 ENCOUNTER — Encounter: Payer: Self-pay | Admitting: Family Medicine

## 2020-12-13 ENCOUNTER — Ambulatory Visit (INDEPENDENT_AMBULATORY_CARE_PROVIDER_SITE_OTHER): Payer: Medicare Other | Admitting: Family Medicine

## 2020-12-13 VITALS — BP 142/74 | HR 60 | Temp 98.6°F | Wt 134.1 lb

## 2020-12-13 DIAGNOSIS — R002 Palpitations: Secondary | ICD-10-CM | POA: Diagnosis not present

## 2020-12-13 NOTE — Progress Notes (Signed)
Established Patient Office Visit  Subjective:  Patient ID: Carmen Cooper, female    DOB: 11/13/1940  Age: 80 y.o. MRN: [id number]  CC:  Chief Complaint  Patient presents with   Follow-up    Holter monitor     HPI [name] presents for f/u on palpitations after using Holter monitor.   Patient was last seen in this office on 11/02/20 with palpitations and history of Afib. CBC was at baseline. Thyroid is being monitored by endocrinology - up 9.33 last month, but he did not want to change her meds given her age and cardiac history. He plans to recheck in 2-3 weeks. She was ordered a Holter monitor. Results showed symptomatic PAC's and SVT. She was instructed to start diltiazem 60 mg q12h and monitor her BP and HR. Here today to follow-up and have BP/HR checked. It has been about 6 months since she has seen her cardiologist.   Today she reports she her palpitations have improved by maybe 50% since starting the diltiazem. However, she reports she does continue to have a few seconds of palpitations every hour or two. States some days are worse than others, but no known triggering factors. She is still quite fatigued.   She denies any chest pain, trouble breathing, edema, syncope.    Past Medical History:  Diagnosis Date   Atrial fibrillation ([place])    BCC (basal cell carcinoma of skin)    Diabetes ([place])    Glaucoma    History of TIA (transient ischemic attack) 08/11/2013   12/2012 - [name]    Hypertension    Hypothyroidism 08/11/2013   Memory changes    Microscopic colitis 08/21/2013   2008 [facility] [name].  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, [facility]     Past Surgical History:  Procedure Laterality Date   CARDIOVERSION N/A 02/12/2019   Procedure: CARDIOVERSION;  Surgeon: [name], MD;  Location: [facility] ENDOSCOPY;  Service: Cardiovascular;  Laterality: N/A;   MOHS  SURGERY  2019   Nose bcc    OTHER SURGICAL HISTORY  04/01/2019   biopsy on nose and lip     Family History  Problem Relation Age of Onset   Heart disease Son    Hypertension Mother    Cancer Mother        unsure of origin   Heart attack Father    Diabetes Neg Hx     Social History   Socioeconomic History   Marital status: Married    Spouse name: [name]   Number of children: 1   Years of education: 12   Highest education level: 12th grade  Occupational History   Occupation: Retired    Comment: retired  Tobacco Use   Smoking status: Former    Packs/day: 0.25    Years: 20.00    Pack years: 5.00    Types: Cigarettes   Smokeless tobacco: Never  Vaping Use   Vaping Use: Never used  Substance and Sexual Activity   Alcohol use: Yes    Alcohol/week: 2.0 standard drinks    Types: 2 Shots of liquor per week    Comment: 2-3 a day   Drug use: No   Sexual activity: Not Currently    Partners: Male  Other Topics Concern   Not on file  Social History Narrative   Patient takes care of her husband who has  dementia. Doesn't get out much. Drinks 2-3 cups of hot tea daily.   Right-handed.   Social Determinants of Health   Financial Resource Strain: Not on file  Food Insecurity: Not on file  Transportation Needs: Not on file  Physical Activity: Not on file  Stress: Not on file  Social Connections: Not on file  Intimate Partner Violence: Not on file    Outpatient Medications Prior to Visit  Medication Sig Dispense Refill   AMBULATORY NON FORMULARY MEDICATION Freestyle light test strips Test twice a day  Dx type 2 diabetes E11.9 100 each 11   apixaban (ELIQUIS) 2.5 MG TABS tablet Take 1 tablet (2.5 mg total) by mouth 2 (two) times daily. 180 tablet 1   atorvastatin (LIPITOR) 40 MG tablet TAKE 1 TABLET DAILY 90 tablet 3   diltiazem (CARDIZEM SR) 60 MG 12 hr capsule TAKE 1 CAPSULE(60 MG) BY MOUTH TWICE DAILY 937 capsule 0   folic acid (FOLVITE) 1 MG tablet Take 1 mg by mouth  daily.      FREESTYLE LITE test strip      Insulin Disposable Pump (OMNIPOD DASH 5 PACK PODS) MISC Inject into the skin.     insulin glargine (LANTUS SOLOSTAR) 100 UNIT/ML Solostar Pen Use in case of pump failure: Remove pump and take 15 units of Lantus insulin as basal insulin in case of insulin pump malfunction/failure. .Do not place pump back on until 20 hours AFTER last dose of Lantus to prevent "double basal infusion."     Insulin Human (INSULIN PUMP) SOLN Inject into the skin. insulin lispro (HUMALOG) 100 UNIT/ML      insulin lispro (HUMALOG) 100 UNIT/ML injection Medtronic 630G pump.  Basal 12-6a 0.625, 6a-7p 0.725, 7p-12a 0.625.  Preset bolus:  4/5/6.  ISF 50.  Total daily dose:  40 units/day     Lifitegrast (XIIDRA) 5 % SOLN Place 1 drop into both eyes daily.      lisinopril (ZESTRIL) 20 MG tablet Take 0.5 tablets (10 mg total) by mouth daily. 45 tablet 3   Multiple Vitamins-Minerals (CENTRUM SILVER 50+WOMEN PO) Take 1 tablet by mouth daily.      mycophenolate (CELLCEPT) 500 MG tablet Take by mouth.     nepafenac (NEVANAC) 0.1 % ophthalmic suspension Place 1 drop into the right eye 3 (three) times daily.      ofloxacin (OCUFLOX) 0.3 % ophthalmic solution Place 1 drop into the right eye 4 times daily.     Omega-3 1000 MG CAPS Take 1,000 mg by mouth daily.      SURE COMFORT PEN NEEDLES 31G X 8 MM MISC      TIROSINT 100 MCG CAPS Take 1 capsule by mouth daily.     ZIOPTAN 0.0015 % SOLN Place 1 drop into both eyes at bedtime.      No facility-administered medications prior to visit.    Allergies  Allergen Reactions   Dexamethasone Anaphylaxis and Other (See Comments)    Blood sugar elevated     Brimonidine Tartrate Other (See Comments)    Burning and redness    Clindamycin/Lincomycin Rash   Sulfa Antibiotics Rash   Valacyclovir Hcl Rash    ROS Review of Systems All review of systems negative except what is listed in the HPI    Objective:    Physical Exam Vitals reviewed.   Constitutional:      Appearance: Normal appearance.  HENT:     Head: Normocephalic and atraumatic.  Cardiovascular:     Rate and Rhythm: Normal  rate and regular rhythm.  Pulmonary:     Effort: Pulmonary effort is normal.     Breath sounds: Normal breath sounds.  Musculoskeletal:     Right lower leg: No edema.     Left lower leg: No edema.  Skin:    General: Skin is warm and dry.  Neurological:     Mental Status: She is alert and oriented to person, place, and time.  Psychiatric:        Mood and Affect: Mood normal.        Behavior: Behavior normal.        Thought Content: Thought content normal.        Judgment: Judgment normal.    BP (!) 150/49 (BP Location: Left Arm, Patient Position: Sitting, Cuff Size: Normal)   Pulse 60   Temp 98.6 F (37 C) (Oral)   Wt 134 lb 1.9 oz (60.8 kg)   BMI 22.32 kg/m  Wt Readings from Last 3 Encounters:  12/13/20 134 lb 1.9 oz (60.8 kg)  09/06/20 125 lb (56.7 kg)  05/31/20 128 lb (58.1 kg)     Health Maintenance Due  Topic Date Due   Zoster Vaccines- Shingrix (1 of 2) Never done   FOOT EXAM  04/04/2019   COVID-19 Vaccine (4 - Booster for Pfizer series) 01/20/2020   Pneumonia Vaccine 46+ Years old (2 - PPSV23 if available, else PCV20) 04/14/2020   INFLUENZA VACCINE  09/13/2020    There are no preventive care reminders to display for this patient.  Lab Results  Component Value Date   TSH 9.93 (H) 03/25/2019   Lab Results  Component Value Date   WBC 5.4 11/02/2020   HGB 13.7 11/02/2020   HCT 42.1 11/02/2020   MCV 99.1 11/02/2020   PLT 241 11/02/2020   Lab Results  Component Value Date   NA 138 03/25/2019   K 4.7 03/25/2019   CO2 32 03/25/2019   GLUCOSE 178 (H) 03/25/2019   BUN 21 03/25/2019   CREATININE 1.09 (H) 03/25/2019   BILITOT 0.9 03/25/2019   ALKPHOS 143 (A) 11/29/2018   AST 68 (H) 03/25/2019   ALT 51 (H) 03/25/2019   PROT 5.9 (L) 03/25/2019   ALBUMIN 3.8 12/06/2015   CALCIUM 8.8 03/25/2019   Lab Results   Component Value Date   CHOL 96 03/27/2017   Lab Results  Component Value Date   HDL 57 03/27/2017   Lab Results  Component Value Date   LDLCALC 28 03/27/2017   Lab Results  Component Value Date   TRIG 75 03/27/2017   Lab Results  Component Value Date   CHOLHDL 1.8 08/11/2013   Lab Results  Component Value Date   HGBA1C 8.8 07/31/2020      Assessment & Plan:   1. Palpitations -Continue current dose of Diltiazem since it has improved your episodes by 50%. Hesitant to increase dose yet with resting HR today 60 bpm. -Try to monitor your blood pressure and heart rate occasionally, maybe once per day or every other day. We want to be sure you blood pressure is not dropping and your resting heart rate is stable.  -Follow-up with endocrinology to monitor thyroid levels -Get back in with cardiology to see if they have any further recommendations. -Continue healthy diet and physical activity.  -If trouble getting in with cardiology, follow-up with Dr. Zigmund Daniel sooner. Otherwise, keep next appointment with him in 2-3 months as scheduled.  -Please contact office for sooner follow-up if symptoms do not improve or  worsen. Seek emergency care if symptoms become severe.   Follow-up: as scheduled or sooner if needed.  Will forward not to PCP for any additional recommendations at this time.   Terrilyn Saver, NP

## 2020-12-13 NOTE — Patient Instructions (Addendum)
-  Continue current dose of Diltiazem -Try to monitor your blood pressure and heart rate occasionally, maybe once per day or every other day. We want to be sure you blood pressure is not dropping and your resting heart rate is stable.  -Follow-up with endocrinology to monitor thyroid levels -Get back in with cardiology to see if they have any further recommendations. -Continue healthy diet and physical activity.  -If trouble getting in with cardiology, follow-up with Dr. Zigmund Daniel sooner. -Please contact office for sooner follow-up if symptoms do not improve or worsen. Seek emergency care if symptoms become severe.

## 2020-12-20 ENCOUNTER — Telehealth: Payer: Self-pay | Admitting: Cardiology

## 2020-12-20 NOTE — Telephone Encounter (Signed)
  Pt's son calling, he said pt's pcp ordered a heart monitor with a result of pt has symptomatic PAC's and SVT, he would like to get Dr. Curt Bears recommendations

## 2020-12-20 NOTE — Telephone Encounter (Signed)
   Terrilyn Saver, NP  11/12/2020  4:46 PM EDT     MyChart message sent: Your cardiac monitor showed that you did have several episodes of faster heart rate that correlated with when you were feeling symptoms. I am going to start you on a low dose of a medication (diltiazem) that will help stabilize your heart rhythm. This can affect your blood pressure so I would like you to schedule an appointment with me or Dr. Zigmund Daniel in 3 weeks to ensure that your blood pressure is not dropping too low and to see if the new medication is helping you feel any better. I sent it to [facility] so you can get it sooner.      Pts Son Darryl (on [place]) is calling in to let Dr. Curt Bears and Venida Jarvis RN know that the pts PCP recently ordered for her to have a 3 day zio placed, for complaints of palpitations.  Above is the result note (monitor visible in Epic) on pts recent monitor, ordered by PCP.  Both Son and pt were made aware of these results by the PCP.  Pts son is inquiring if Dr. Curt Bears would also take a look at the pts monitor results, and see if he agrees with interpretation and plan.  Informed the pts Son that I will route this message to Dr. Curt Bears and his RN to further review pts recent monitor results, advise, and follow-up with the pts Son accordingly thereafter.  Son verbalized understanding and agrees with this plan. Son was more than gracious for all the assistance provided.

## 2020-12-23 DIAGNOSIS — Z9641 Presence of insulin pump (external) (internal): Secondary | ICD-10-CM | POA: Diagnosis not present

## 2020-12-23 DIAGNOSIS — N183 Chronic kidney disease, stage 3 unspecified: Secondary | ICD-10-CM | POA: Diagnosis not present

## 2020-12-23 DIAGNOSIS — E1021 Type 1 diabetes mellitus with diabetic nephropathy: Secondary | ICD-10-CM | POA: Diagnosis not present

## 2020-12-23 DIAGNOSIS — E10649 Type 1 diabetes mellitus with hypoglycemia without coma: Secondary | ICD-10-CM | POA: Diagnosis not present

## 2020-12-23 DIAGNOSIS — E1049 Type 1 diabetes mellitus with other diabetic neurological complication: Secondary | ICD-10-CM | POA: Diagnosis not present

## 2020-12-23 DIAGNOSIS — E1022 Type 1 diabetes mellitus with diabetic chronic kidney disease: Secondary | ICD-10-CM | POA: Diagnosis not present

## 2020-12-23 LAB — HEMOGLOBIN A1C: Hemoglobin A1C: 8.5

## 2020-12-24 NOTE — Telephone Encounter (Signed)
Left message to call back  

## 2020-12-27 DIAGNOSIS — H44111 Panuveitis, right eye: Secondary | ICD-10-CM | POA: Diagnosis not present

## 2020-12-27 DIAGNOSIS — Z79899 Other long term (current) drug therapy: Secondary | ICD-10-CM | POA: Diagnosis not present

## 2020-12-27 DIAGNOSIS — H3581 Retinal edema: Secondary | ICD-10-CM | POA: Diagnosis not present

## 2020-12-27 DIAGNOSIS — H18892 Other specified disorders of cornea, left eye: Secondary | ICD-10-CM | POA: Diagnosis not present

## 2020-12-27 DIAGNOSIS — H4041X1 Glaucoma secondary to eye inflammation, right eye, mild stage: Secondary | ICD-10-CM | POA: Diagnosis not present

## 2020-12-27 DIAGNOSIS — H35443 Age-related reticular degeneration of retina, bilateral: Secondary | ICD-10-CM | POA: Diagnosis not present

## 2020-12-27 DIAGNOSIS — Z882 Allergy status to sulfonamides status: Secondary | ICD-10-CM | POA: Diagnosis not present

## 2020-12-27 DIAGNOSIS — H30031 Focal chorioretinal inflammation, peripheral, right eye: Secondary | ICD-10-CM | POA: Diagnosis not present

## 2020-12-27 DIAGNOSIS — H3589 Other specified retinal disorders: Secondary | ICD-10-CM | POA: Diagnosis not present

## 2020-12-27 DIAGNOSIS — Z48810 Encounter for surgical aftercare following surgery on the sense organs: Secondary | ICD-10-CM | POA: Diagnosis not present

## 2020-12-27 DIAGNOSIS — Z888 Allergy status to other drugs, medicaments and biological substances status: Secondary | ICD-10-CM | POA: Diagnosis not present

## 2020-12-27 DIAGNOSIS — Z881 Allergy status to other antibiotic agents status: Secondary | ICD-10-CM | POA: Diagnosis not present

## 2020-12-27 DIAGNOSIS — H353212 Exudative age-related macular degeneration, right eye, with inactive choroidal neovascularization: Secondary | ICD-10-CM | POA: Diagnosis not present

## 2020-12-27 DIAGNOSIS — Z961 Presence of intraocular lens: Secondary | ICD-10-CM | POA: Diagnosis not present

## 2020-12-27 DIAGNOSIS — H353221 Exudative age-related macular degeneration, left eye, with active choroidal neovascularization: Secondary | ICD-10-CM | POA: Diagnosis not present

## 2020-12-27 DIAGNOSIS — H3530 Unspecified macular degeneration: Secondary | ICD-10-CM | POA: Diagnosis not present

## 2020-12-27 DIAGNOSIS — H209 Unspecified iridocyclitis: Secondary | ICD-10-CM | POA: Diagnosis not present

## 2020-12-27 DIAGNOSIS — H401120 Primary open-angle glaucoma, left eye, stage unspecified: Secondary | ICD-10-CM | POA: Diagnosis not present

## 2020-12-29 NOTE — Telephone Encounter (Signed)
Patient's son was returning nurse call. Please advise

## 2020-12-31 NOTE — Telephone Encounter (Signed)
Spoke to son Informed Dr. Curt Bears agrees with finding and to start Diltiazem. He reports that pt has not started the medication yet as they were waiting to speak with [place] first. Aware to start Diltiazem as prescribed and let us know if any issues begin after starting and/or SE from medication, Son verbalized understanding and agreeable to plan.  He appreciates Dr. Curt Bears opinion on this.

## 2021-01-05 DIAGNOSIS — E039 Hypothyroidism, unspecified: Secondary | ICD-10-CM | POA: Diagnosis not present

## 2021-01-14 ENCOUNTER — Other Ambulatory Visit: Payer: Self-pay

## 2021-01-14 DIAGNOSIS — R002 Palpitations: Secondary | ICD-10-CM

## 2021-01-14 MED ORDER — DILTIAZEM HCL ER 60 MG PO CP12: ORAL_CAPSULE | ORAL | 0 refills | Status: DC

## 2021-02-03 DIAGNOSIS — H4041X1 Glaucoma secondary to eye inflammation, right eye, mild stage: Secondary | ICD-10-CM | POA: Diagnosis not present

## 2021-02-03 DIAGNOSIS — H353212 Exudative age-related macular degeneration, right eye, with inactive choroidal neovascularization: Secondary | ICD-10-CM | POA: Diagnosis not present

## 2021-02-03 DIAGNOSIS — H3581 Retinal edema: Secondary | ICD-10-CM | POA: Diagnosis not present

## 2021-02-03 DIAGNOSIS — H401120 Primary open-angle glaucoma, left eye, stage unspecified: Secondary | ICD-10-CM | POA: Diagnosis not present

## 2021-02-03 DIAGNOSIS — Z79899 Other long term (current) drug therapy: Secondary | ICD-10-CM | POA: Diagnosis not present

## 2021-02-03 DIAGNOSIS — Z961 Presence of intraocular lens: Secondary | ICD-10-CM | POA: Diagnosis not present

## 2021-02-03 DIAGNOSIS — H44111 Panuveitis, right eye: Secondary | ICD-10-CM | POA: Diagnosis not present

## 2021-02-03 DIAGNOSIS — H30031 Focal chorioretinal inflammation, peripheral, right eye: Secondary | ICD-10-CM | POA: Diagnosis not present

## 2021-02-03 DIAGNOSIS — H353221 Exudative age-related macular degeneration, left eye, with active choroidal neovascularization: Secondary | ICD-10-CM | POA: Diagnosis not present

## 2021-02-03 DIAGNOSIS — H209 Unspecified iridocyclitis: Secondary | ICD-10-CM | POA: Diagnosis not present

## 2021-02-15 DIAGNOSIS — H30031 Focal chorioretinal inflammation, peripheral, right eye: Secondary | ICD-10-CM | POA: Diagnosis not present

## 2021-02-15 DIAGNOSIS — H4041X1 Glaucoma secondary to eye inflammation, right eye, mild stage: Secondary | ICD-10-CM | POA: Diagnosis not present

## 2021-02-15 DIAGNOSIS — H401120 Primary open-angle glaucoma, left eye, stage unspecified: Secondary | ICD-10-CM | POA: Diagnosis not present

## 2021-02-15 DIAGNOSIS — B0052 Herpesviral keratitis: Secondary | ICD-10-CM | POA: Diagnosis not present

## 2021-02-15 DIAGNOSIS — H4043X3 Glaucoma secondary to eye inflammation, bilateral, severe stage: Secondary | ICD-10-CM | POA: Diagnosis not present

## 2021-02-15 DIAGNOSIS — Z794 Long term (current) use of insulin: Secondary | ICD-10-CM | POA: Diagnosis not present

## 2021-02-15 DIAGNOSIS — Z947 Corneal transplant status: Secondary | ICD-10-CM | POA: Diagnosis not present

## 2021-02-15 DIAGNOSIS — H1820 Unspecified corneal edema: Secondary | ICD-10-CM | POA: Diagnosis not present

## 2021-02-15 DIAGNOSIS — E119 Type 2 diabetes mellitus without complications: Secondary | ICD-10-CM | POA: Diagnosis not present

## 2021-02-15 DIAGNOSIS — H209 Unspecified iridocyclitis: Secondary | ICD-10-CM | POA: Diagnosis not present

## 2021-03-09 ENCOUNTER — Encounter: Payer: Self-pay | Admitting: Family Medicine

## 2021-03-09 ENCOUNTER — Other Ambulatory Visit: Payer: Self-pay

## 2021-03-09 ENCOUNTER — Ambulatory Visit (INDEPENDENT_AMBULATORY_CARE_PROVIDER_SITE_OTHER): Payer: Medicare Other | Admitting: Family Medicine

## 2021-03-09 DIAGNOSIS — I152 Hypertension secondary to endocrine disorders: Secondary | ICD-10-CM

## 2021-03-09 DIAGNOSIS — M1731 Unilateral post-traumatic osteoarthritis, right knee: Secondary | ICD-10-CM

## 2021-03-09 DIAGNOSIS — E1039 Type 1 diabetes mellitus with other diabetic ophthalmic complication: Secondary | ICD-10-CM | POA: Diagnosis not present

## 2021-03-09 DIAGNOSIS — E031 Congenital hypothyroidism without goiter: Secondary | ICD-10-CM | POA: Diagnosis not present

## 2021-03-09 DIAGNOSIS — I4891 Unspecified atrial fibrillation: Secondary | ICD-10-CM | POA: Diagnosis not present

## 2021-03-09 DIAGNOSIS — E1159 Type 2 diabetes mellitus with other circulatory complications: Secondary | ICD-10-CM | POA: Diagnosis not present

## 2021-03-09 NOTE — Patient Instructions (Addendum)
Try voltaren gel on the knee.  This is available over the counter. Use as directed on packaging.  See me again in 6 months.

## 2021-03-09 NOTE — Progress Notes (Signed)
Carmen Cooper - 80 y.o. female MRN [id number]  Date of birth: [birth year]  Subjective Chief Complaint  Patient presents with   Follow-up    HPI Carmen Cooper is an 81 year old female here today for follow-up visit.  Reports that her dog passed away last week and she has had some difficulty coping with this.  She feels like this is the reason her blood pressure is elevated today.  She is taking medications as directed.  She has had some intermittent headaches but denies chest pain, shortness of breath, palpitations.  She does continue to see cardiology as well for management of her A. fib.  She continues see endocrinology for management of her diabetes.  Last A1c was 8.5% a few months ago.  She continues on insulin pump with Humalog.  She denies any symptoms of hypoglycemia.  She does see ophthalmology regularly due to vision loss related to diabetes and glaucoma.  He is having some knee pain and stiffness.  This is worse on the right side.  She does notice some swelling at times.  She has not tried anything to help with this.  ROS:  A comprehensive ROS was completed and negative except as noted per HPI  Allergies  Allergen Reactions   Dexamethasone Anaphylaxis and Other (See Comments)    Blood sugar elevated     Brimonidine Tartrate Other (See Comments)    Burning and redness    Clindamycin/Lincomycin Rash   Sulfa Antibiotics Rash   Valacyclovir Hcl Rash    Past Medical History:  Diagnosis Date   Atrial fibrillation ([place])    BCC (basal cell carcinoma of skin)    Diabetes ([place])    Glaucoma    History of TIA (transient ischemic attack) 08/11/2013   12/2012 - [name]    Hypertension    Hypothyroidism 08/11/2013   Memory changes    Microscopic colitis 08/21/2013   2008 - [facility] [name].  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, [facility]     Past Surgical History:  Procedure  Laterality Date   CARDIOVERSION N/A 02/12/2019   Procedure: CARDIOVERSION;  Surgeon: [name], MD;  Location: [facility] ENDOSCOPY;  Service: Cardiovascular;  Laterality: N/A;   MOHS SURGERY  2019   Nose bcc    OTHER SURGICAL HISTORY  04/01/2019   biopsy on nose and lip     Social History   Socioeconomic History   Marital status: Married    Spouse name: [name]   Number of children: 1   Years of education: 12   Highest education level: 12th grade  Occupational History   Occupation: Retired    Comment: retired  Tobacco Use   Smoking status: Former    Packs/day: 0.25    Years: 20.00    Pack years: 5.00    Types: Cigarettes   Smokeless tobacco: Never  Vaping Use   Vaping Use: Never used  Substance and Sexual Activity   Alcohol use: Yes    Alcohol/week: 2.0 standard drinks    Types: 2 Shots of liquor per week    Comment: 2-3 a day   Drug use: No   Sexual activity: Not Currently    Partners: Male  Other Topics Concern   Not on file  Social History Narrative   Patient takes care of her husband who has dementia. Doesn't get out much. Drinks 2-3 cups of hot tea daily.  Right-handed.   Social Determinants of Health   Financial Resource Strain: Not on file  Food Insecurity: Not on file  Transportation Needs: Not on file  Physical Activity: Not on file  Stress: Not on file  Social Connections: Not on file    Family History  Problem Relation Age of Onset   Heart disease Son    Hypertension Mother    Cancer Mother        unsure of origin   Heart attack Father    Diabetes Neg Hx     Health Maintenance  Topic Date Due   Zoster Vaccines- Shingrix (1 of 2) Never done   COVID-19 Vaccine (5 - Booster for Pfizer series) 01/20/2020   INFLUENZA VACCINE  09/13/2020   HEMOGLOBIN A1C  06/22/2021   OPHTHALMOLOGY EXAM  07/21/2021   FOOT EXAM  03/09/2022   TETANUS/TDAP  07/25/2025   Pneumonia Vaccine 71+ Years old  Completed   DEXA SCAN  Completed   HPV VACCINES  Aged Out      ----------------------------------------------------------------------------------------------------------------------------------------------------------------------------------------------------------------- Physical Exam BP (!) 158/70 (BP Location: Left Arm, Patient Position: Sitting, Cuff Size: Small)    Pulse 63    Ht 5\' 5"  (1.651 m)    Wt 138 lb (62.6 kg)    SpO2 97%    BMI 22.96 kg/m   Physical Exam Constitutional:      Appearance: Normal appearance.  Eyes:     General: No scleral icterus. Cardiovascular:     Rate and Rhythm: Normal rate.  Pulmonary:     Effort: Pulmonary effort is normal.     Breath sounds: Normal breath sounds.  Musculoskeletal:     Cervical back: Neck supple.  Neurological:     Mental Status: She is alert.  Psychiatric:        Mood and Affect: Mood normal.        Behavior: Behavior normal.    ------------------------------------------------------------------------------------------------------------------------------------------------------------------------------------------------------------------- Assessment and Plan  Atrial fibrillation ([place]) Denies symptoms at this time.  Management per cardiology.  She will continue on diltiazem for rate control and Eliquis for anticoagulation.  Hypertension associated with diabetes ([place]) Blood pressure is elevated today.  Reports increased stress recently related to the passing of her pet.  She will monitor at home.  Hypothyroidism Managed with endocrinology.  Stable at this time.  Right knee DJD Recommend trial of topical Voltaren.  Type 1 diabetes ([place]) Treated with insulin pump that is managed by endocrinology.   No orders of the defined types were placed in this encounter.   Return in about 6 months (around 09/06/2021) for HTN.    This visit occurred during the SARS-CoV-2 public health emergency.  Safety protocols were in place, including screening questions prior to the visit, additional  usage of staff PPE, and extensive cleaning of exam room while observing appropriate contact time as indicated for disinfecting solutions.

## 2021-03-09 NOTE — Assessment & Plan Note (Signed)
Managed with endocrinology.  Stable at this time.

## 2021-03-09 NOTE — Assessment & Plan Note (Signed)
Denies symptoms at this time.  Management per cardiology.  She will continue on diltiazem for rate control and Eliquis for anticoagulation.

## 2021-03-09 NOTE — Assessment & Plan Note (Signed)
Recommend trial of topical Voltaren.

## 2021-03-09 NOTE — Assessment & Plan Note (Signed)
Blood pressure is elevated today.  Reports increased stress recently related to the passing of her pet.  She will monitor at home.

## 2021-03-09 NOTE — Assessment & Plan Note (Signed)
Treated with insulin pump that is managed by endocrinology.

## 2021-03-11 ENCOUNTER — Ambulatory Visit (INDEPENDENT_AMBULATORY_CARE_PROVIDER_SITE_OTHER): Payer: Medicare Other | Admitting: Family Medicine

## 2021-03-11 DIAGNOSIS — Z780 Asymptomatic menopausal state: Secondary | ICD-10-CM

## 2021-03-11 DIAGNOSIS — Z0000 Encounter for general adult medical examination without abnormal findings: Secondary | ICD-10-CM

## 2021-03-11 NOTE — Progress Notes (Signed)
MEDICARE ANNUAL WELLNESS VISIT  03/11/2021  Telephone Visit Disclaimer This Medicare AWV was conducted by telephone due to national recommendations for restrictions regarding the COVID-19 Pandemic (e.g. social distancing).  I verified, using two identifiers, that I am speaking with Carmen Cooper or their authorized healthcare agent. I discussed the limitations, risks, security, and privacy concerns of performing an evaluation and management service by telephone and the potential availability of an in-person appointment in the future. The patient expressed understanding and agreed to proceed.  Location of Patient: Home Location of Provider (nurse):  In the office.  Subjective:    Carmen Cooper is a 81 y.o. female patient of Carmen Nutting, Carmen Cooper who had a Medicare Annual Wellness Visit today via telephone. Carmen Cooper is Retired and lives alone. she has 1 child. she reports that she is socially active and does interact with friends/family regularly. she is moderately physically active and enjoys crocheting and gardening.  Patient Care Team: Carmen Nutting, Carmen Cooper as PCP - General (Family Medicine) Carmen Morning, MD (Gastroenterology) Gerome Apley, MD as Referring Physician (Endocrinology) Feliz Beam, MD as Referring Physician (Ophthalmology) Ander Slade, Carlisle Beers, MD as Referring Physician (Ophthalmology) Constance Haw, MD as Consulting Physician (Cardiology)  Advanced Directives 03/11/2021 02/12/2019 04/11/2018 02/12/2018  Does Patient Have a Medical Advance Directive? Yes Yes Yes Yes  Type of Advance Directive Living will;Healthcare Power of [place];Living will [place];Living will [place];Living will  Does patient want to make changes to medical advance directive? No - Patient declined - - No - Patient declined  Copy of [place] in Chart? No - copy requested - - No - copy requested     Hospital Utilization Over the Past 12 Months: # of hospitalizations or ER visits: 1 # of surgeries: 0  Review of Systems    Patient reports that her overall health is worse compared to last year.  History obtained from chart review and the patient  Patient Reported Readings (BP, Pulse, CBG, Weight, etc) none  Pain Assessment Pain : 0-10 Pain Score: 4  Pain Type: Chronic pain Pain Location: Knee Pain Descriptors / Indicators: Constant Pain Onset: More than a month ago Pain Frequency: Constant Pain Relieving Factors: rest  Pain Relieving Factors: rest  Current Medications & Allergies (verified) Allergies as of 03/11/2021       Reactions   Dexamethasone Anaphylaxis, Other (See Comments)   Blood sugar elevated    Brimonidine Tartrate Other (See Comments)   Burning and redness   Clindamycin/lincomycin Rash   Sulfa Antibiotics Rash   Valacyclovir Hcl Rash        Medication List        Accurate as of March 11, 2021  8:36 AM. If you have any questions, ask your nurse or doctor.          AMBULATORY NON FORMULARY MEDICATION Freestyle light test strips Test twice a day  Dx type 2 diabetes E11.9   apixaban 5 MG Tabs tablet Commonly known as: ELIQUIS Take by mouth.   atorvastatin 40 MG tablet Commonly known as: LIPITOR TAKE 1 TABLET DAILY   Baqsimi Two Pack 3 MG/DOSE Powd Generic drug: Glucagon Place into both nostrils.   CENTRUM SILVER 50+WOMEN PO Take 1 tablet by mouth daily.   Cosopt PF 22.3-6.8 MG/ML Soln ophthalmic solution Generic drug: dorzolamidel-timolol   diltiazem 60 MG 12 hr capsule Commonly known as: CARDIZEM SR TAKE 1 CAPSULE(60 MG) BY MOUTH TWICE  DAILY   folic acid 1 MG tablet Commonly known as: FOLVITE Take 1 mg by mouth daily.   FREESTYLE LITE test strip Generic drug: glucose blood   insulin lispro 100 UNIT/ML injection Commonly known as: HUMALOG Medtronic 630G pump.  Basal 12-6a 0.625, 6a-7p 0.725, 7p-12a 0.625.  Preset  bolus:  4/5/6.  ISF 50.  Total daily dose:  40 units/day   insulin pump Soln Inject into the skin. insulin lispro (HUMALOG) 100 UNIT/ML   Lantus SoloStar 100 UNIT/ML Solostar Pen Generic drug: insulin glargine Use in case of pump failure: Remove pump and take 15 units of Lantus insulin as basal insulin in case of insulin pump malfunction/failure. .Carmen Cooper not place pump back on until 20 hours AFTER last dose of Lantus to prevent "double basal infusion."   lisinopril 20 MG tablet Commonly known as: ZESTRIL Take 0.5 tablets (10 mg total) by mouth daily.   mycophenolate 500 MG tablet Commonly known as: CELLCEPT Take by mouth.   nepafenac 0.1 % ophthalmic suspension Commonly known as: [place] 1 drop into the right eye 3 (three) times daily.   ofloxacin 0.3 % ophthalmic solution Commonly known as: OCUFLOX Place 1 drop into the right eye 4 times daily.   Omega-3 1000 MG Caps Take 1,000 mg by mouth daily.   Omnipod DASH Pods (Gen 4) Misc Inject into the skin.   Sure Comfort Pen Needles 31G X 8 MM Misc Generic drug: Insulin Pen Needle   Tirosint 100 MCG Caps Generic drug: Levothyroxine Sodium Take 1 capsule by mouth daily.   valACYclovir 1000 MG tablet Commonly known as: VALTREX Take 1,000 mg by mouth daily.   Xiidra 5 % Soln Generic drug: Lifitegrast Place 1 drop into both eyes daily.   Zioptan 0.0015 % Soln Generic drug: Tafluprost (PF) Place 1 drop into both eyes at bedtime.        History (reviewed): Past Medical History:  Diagnosis Date   Atrial fibrillation ([place])    BCC (basal cell carcinoma of skin)    Diabetes ([place])    Glaucoma    History of TIA (transient ischemic attack) 08/11/2013   12/2012 - [name]    Hypertension    Hypothyroidism 08/11/2013   Memory changes    Microscopic colitis 08/21/2013   2008 [facility] Center [name].  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, [facility]    Past Surgical History:  Procedure Laterality Date   CARDIOVERSION N/A 02/12/2019   Procedure: CARDIOVERSION;  Surgeon: [name], MD;  Location: [facility] ENDOSCOPY;  Service: Cardiovascular;  Laterality: N/A;   MOHS SURGERY  2019   Nose bcc    OTHER SURGICAL HISTORY  04/01/2019   biopsy on nose and lip    Family History  Problem Relation Age of Onset   Heart disease Son    Hypertension Mother    Cancer Mother        unsure of origin   Heart attack Father    Diabetes Neg Hx    Social History   Socioeconomic History   Marital status: Widowed    Spouse name: Rachel Bo   Number of children: 1   Years of education: 12   Highest education level: 12th grade  Occupational History   Occupation: Retired    Comment: retired  Tobacco Use   Smoking status: Former    Packs/day: 0.25    Years:  20.00    Pack years: 5.00    Types: Cigarettes   Smokeless tobacco: Never  Vaping Use   Vaping Use: Never used  Substance and Sexual Activity   Alcohol use: Not Currently   Drug use: No   Sexual activity: Not Currently    Partners: Male  Other Topics Concern   Not on file  Social History Narrative   Lives alone. She has one son, who takes her to her doctor's appointments. She enjoys crochet and gardening.   Social Determinants of Health   Financial Resource Strain: Low Risk    Difficulty of Paying Living Expenses: Not hard at all  Food Insecurity: No Food Insecurity   Worried About Charity fundraiser in the Last Year: Never true   [place] in the Last Year: Never true  Transportation Needs: No Transportation Needs   Lack of Transportation (Medical): No   Lack of Transportation (Non-Medical): No  Physical Activity: Sufficiently Active   Days of Exercise per Week: 7 days   Minutes of Exercise per Session: 40 min  Stress: No Stress Concern Present   Feeling of Stress : Not at all  Social Connections: Socially Isolated   Frequency of Communication  with Friends and Family: Twice a week   Frequency of Social Gatherings with Friends and Family: Never   Attends Religious Services: Never   Marine scientist or Organizations: No   Attends Archivist Meetings: Never   Marital Status: Widowed    Activities of Daily Living In your present state of health, Carmen Cooper you have any difficulty performing the following activities: 03/11/2021  Hearing? N  Vision? Y  Comment she can't see well. she gets shots in her right eye. She has a glaucoma.  Difficulty concentrating or making decisions? Y  Comment she has noticed some memory loss.  Walking or climbing stairs? N  Dressing or bathing? N  Doing errands, shopping? Y  Comment she doesn't drive.  Preparing Food and eating ? N  Using the Toilet? N  In the past six months, have you accidently leaked urine? Y  Comment she does leak urine when she bends over.  Carmen Cooper you have problems with loss of bowel control? N  Managing your Medications? N  Managing your Finances? Y  Comment her son manages her finances.  Housekeeping or managing your Housekeeping? N  Some recent data might be hidden    Patient Education/ Literacy How often Carmen Cooper you need to have someone help you when you read instructions, pamphlets, or other written materials from your doctor or pharmacy?: 2 - Rarely What is the last grade level you completed in school?: High school  Exercise Current Exercise Habits: Home exercise routine, Type of exercise: walking, Time (Minutes): 40, Frequency (Times/Week): 7, Weekly Exercise (Minutes/Week): 280, Intensity: Moderate, Exercise limited by: None identified  Diet Patient reports consuming 3 meals a day and 0 snack(s) a day Patient reports that her primary diet is: Regular Patient reports that she does have regular access to food.   Depression Screen PHQ 2/9 Scores 03/11/2021 09/06/2020 01/02/2019 04/03/2018 02/12/2018 06/19/2017 10/30/2016  PHQ - 2 Score 2 2 0 5 2 5  0  PHQ- 9 Score 2 -  - 13 6 16  -     Fall Risk Fall Risk  03/11/2021 03/09/2021 09/06/2020 01/02/2019 02/12/2018  Falls in the past year? 0 0 0 0 0  Comment - - - - -  Number falls in past yr: 0 0 -  0 -  Injury with Fall? 0 0 0 0 -  Risk for fall due to : No Fall Risks No Fall Risks No Fall Risks - -  Follow up Falls evaluation completed;Education provided Falls evaluation completed Falls evaluation completed - -     Objective:  Carmen Cooper seemed alert and oriented and she participated appropriately during our telephone visit.  Blood Pressure Weight BMI  BP Readings from Last 3 Encounters:  03/09/21 (!) 158/70  12/13/20 (!) 142/74  11/02/20 137/79   Wt Readings from Last 3 Encounters:  03/09/21 138 lb (62.6 kg)  12/13/20 134 lb 1.9 oz (60.8 kg)  09/06/20 125 lb (56.7 kg)   BMI Readings from Last 1 Encounters:  03/09/21 22.96 kg/m    *Unable to obtain current vital signs, weight, and BMI due to telephone visit type  Hearing/Vision  Zhara did not seem to have difficulty with hearing/understanding during the telephone conversation Reports that she has had a formal eye exam by an eye care professional within the past year Reports that she has not had a formal hearing evaluation within the past year *Unable to fully assess hearing and vision during telephone visit type  Cognitive Function: 6CIT Screen 03/11/2021 02/12/2018  What Year? 0 points 0 points  What month? 0 points 0 points  What time? 0 points 0 points  Count back from 20 0 points 0 points  Months in reverse 2 points 0 points  Repeat phrase 0 points 0 points  Total Score 2 0   (Normal:0-7, Significant for Dysfunction: >8)  Normal Cognitive Function Screening: Yes   Immunization & Health Maintenance Record Immunization History  Administered Date(s) Administered   19-influenza Whole 10/24/2018   Fluad Quad(high Dose 65+) 10/24/2018, 03/09/2020   Influenza Whole 10/22/2015, 10/30/2016, 11/30/2016, 11/19/2017   Influenza, High Dose  Seasonal PF 10/22/2015, 10/30/2016   Influenza,inj,Quad PF,6+ Mos 11/11/2013, 09/27/2017   Influenza-Unspecified 11/18/2014, 11/13/2020   PFIZER Comirnaty(Gray Top)Covid-19 Tri-Sucrose Vaccine 11/25/2019   PFIZER(Purple Top)SARS-COV-2 Vaccination 02/27/2019, 03/20/2019, 11/25/2019   Pneumococcal Conjugate-13 10/22/2015, 04/15/2019   Pneumococcal Polysaccharide-23 12/25/2012, 03/06/2017   Pneumococcal-Unspecified 12/25/2012, 03/06/2017   Tdap 07/26/2015   Zoster, Live 11/04/2015    Health Maintenance  Topic Date Due   COVID-19 Vaccine (5 - Booster for [place] series) 03/27/2021 (Originally 01/20/2020)   Zoster Vaccines- Shingrix (1 of 2) 06/09/2021 (Originally 02/13/1960)   HEMOGLOBIN A1C  06/22/2021   OPHTHALMOLOGY EXAM  07/21/2021   FOOT EXAM  03/09/2022   TETANUS/TDAP  07/25/2025   Pneumonia Vaccine 12+ Years old  Completed   INFLUENZA VACCINE  Completed   DEXA SCAN  Completed   HPV VACCINES  Aged Out       Assessment  This is a routine wellness examination for [employer].  Health Maintenance: Due or Overdue There are no preventive care reminders to display for this patient.   Princessa Lesmeister does not need a referral for Community Assistance: Care Management:   no Social Work:    no Prescription Assistance:  no Nutrition/Diabetes Education:  no   Plan:  Personalized Goals  Goals Addressed               This Visit's Progress     Patient Stated (pt-stated)        03/11/2021 AWV Goal: Improved Nutrition/Diet  Patient will verbalize understanding that diet plays an important role in overall health and that a poor diet is a risk factor for many chronic medical conditions.  Over the next year, patient will  improve self management of their diet by incorporating more water. Patient will utilize available community resources to help with food acquisition if needed (ex: food pantries, [address], etc) Patient will work with nutrition specialist if a referral was made         Personalized Health Maintenance & Screening Recommendations  Shingrix Bone density screening  Lung Cancer Screening Recommended: no (Low Dose CT Chest recommended if Age 21-80 years, 30 pack-year currently smoking OR have quit w/in past 15 years) Hepatitis C Screening recommended: no HIV Screening recommended: no  Advanced Directives: Written information was not prepared per patient's request.  Referrals & Orders Orders Placed This Encounter  Procedures   [place]    Follow-up Plan Follow-up with Carmen Nutting, Carmen Cooper as planned Schedule your shingrix shot at your pharmacy. Medicare wellness visit in one year.  AVS printed and mailed to the patient.   I have personally reviewed and noted the following in the patients chart:   Medical and social history Use of alcohol, tobacco or illicit drugs  Current medications and supplements Functional ability and status Nutritional status Physical activity Advanced directives List of other physicians Hospitalizations, surgeries, and ER visits in previous 12 months Vitals Screenings to include cognitive, depression, and falls Referrals and appointments  In addition, I have reviewed and discussed with Carmen Cooper certain preventive protocols, quality metrics, and best practice recommendations. A written personalized care plan for preventive services as well as general preventive health recommendations is available and can be mailed to the patient at her request.      Tinnie Gens, RN  03/11/2021

## 2021-03-11 NOTE — Patient Instructions (Addendum)
[place] Maintenance Summary and Written Plan of Care  [name] ,  Thank you for allowing me to perform your Medicare Annual Wellness Visit and for your ongoing commitment to your health.   Health Maintenance & Immunization History Health Maintenance  Topic Date Due   COVID-19 Vaccine (5 - Booster for Pfizer series) 03/27/2021 (Originally 01/20/2020)   Zoster Vaccines- Shingrix (1 of 2) 06/09/2021 (Originally 02/13/1960)   HEMOGLOBIN A1C  06/22/2021   OPHTHALMOLOGY EXAM  07/21/2021   FOOT EXAM  03/09/2022   TETANUS/TDAP  07/25/2025   Pneumonia Vaccine 83+ Years old  Completed   INFLUENZA VACCINE  Completed   DEXA SCAN  Completed   HPV VACCINES  Aged Out   Immunization History  Administered Date(s) Administered   19-influenza Whole 10/24/2018   Fluad Quad(high Dose 65+) 10/24/2018, 03/09/2020   Influenza Whole 10/22/2015, 10/30/2016, 11/30/2016, 11/19/2017   Influenza, High Dose Seasonal PF 10/22/2015, 10/30/2016   Influenza,inj,Quad PF,6+ Mos 11/11/2013, 09/27/2017   Influenza-Unspecified 11/18/2014, 11/13/2020   PFIZER Comirnaty(Gray Top)Covid-19 Tri-Sucrose Vaccine 11/25/2019   PFIZER(Purple Top)SARS-COV-2 Vaccination 02/27/2019, 03/20/2019, 11/25/2019   Pneumococcal Conjugate-13 10/22/2015, 04/15/2019   Pneumococcal Polysaccharide-23 12/25/2012, 03/06/2017   Pneumococcal-Unspecified 12/25/2012, 03/06/2017   Tdap 07/26/2015   Zoster, Live 11/04/2015    These are the patient goals that we discussed:  Goals Addressed               This Visit's Progress     Patient Stated (pt-stated)        03/11/2021 AWV Goal: Improved Nutrition/Diet  Patient will verbalize understanding that diet plays an important role in overall health and that a poor diet is a risk factor for many chronic medical conditions.  Over the next year, patient will improve self management of their diet by incorporating more water. Patient will utilize available  community resources to help with food acquisition if needed (ex: food pantries, [address], etc) Patient will work with nutrition specialist if a referral was made          This is a list of Health Maintenance Items that are overdue or due now: Shingrix Bone density screening    Orders/Referrals Placed Today: Orders Placed This Encounter  Procedures   DEXAScan    Standing Status:   Future    Standing Expiration Date:   03/11/2022    Scheduling Instructions:     Please call patient to schedule.    Order Specific Question:   Reason for exam:    Answer:   Post Menopausal    Order Specific Question:   Preferred imaging location?    Answer:   [facility]   (Contact our referral department at [phone] if you have not spoken with someone about your referral appointment within the next 5 days)    Follow-up Plan Follow-up with Luetta Nutting, DO as planned Schedule your shingrix shot at your pharmacy. Medicare wellness visit in one year.  AVS printed and mailed to the patient.      Health Maintenance, Female Adopting a healthy lifestyle and getting preventive care are important in promoting health and wellness. Ask your health care provider about: The right schedule for you to have regular tests and exams. Things you can do on your own to prevent diseases and keep yourself healthy. What should I know about diet, weight, and exercise? Eat a healthy diet  Eat a diet that includes plenty of vegetables, fruits, low-fat dairy products, and lean protein. Do not eat a lot of  foods that are high in solid fats, added sugars, or sodium. Maintain a healthy weight Body mass index (BMI) is used to identify weight problems. It estimates body fat based on height and weight. Your health care provider can help determine your BMI and help you achieve or maintain a healthy weight. Get regular exercise Get regular exercise. This is one of the most important things you can do for your  health. Most adults should: Exercise for at least 150 minutes each week. The exercise should increase your heart rate and make you sweat (moderate-intensity exercise). Do strengthening exercises at least twice a week. This is in addition to the moderate-intensity exercise. Spend less time sitting. Even light physical activity can be beneficial. Watch cholesterol and blood lipids Have your blood tested for lipids and cholesterol at 82 years of age, then have this test every 5 years. Have your cholesterol levels checked more often if: Your lipid or cholesterol levels are high. You are older than 82 years of age. You are at high risk for heart disease. What should I know about cancer screening? Depending on your health history and family history, you may need to have cancer screening at various ages. This may include screening for: Breast cancer. Cervical cancer. Colorectal cancer. Skin cancer. Lung cancer. What should I know about heart disease, diabetes, and high blood pressure? Blood pressure and heart disease High blood pressure causes heart disease and increases the risk of stroke. This is more likely to develop in people who have high blood pressure readings or are overweight. Have your blood pressure checked: Every 3-5 years if you are 79-43 years of age. Every year if you are 21 years old or older. Diabetes Have regular diabetes screenings. This checks your fasting blood sugar level. Have the screening done: Once every three years after age 19 if you are at a normal weight and have a low risk for diabetes. More often and at a younger age if you are overweight or have a high risk for diabetes. What should I know about preventing infection? Hepatitis B If you have a higher risk for hepatitis B, you should be screened for this virus. Talk with your health care provider to find out if you are at risk for hepatitis B infection. Hepatitis C Testing is recommended for: Everyone born  from [birth year] through 1966. Anyone with known risk factors for hepatitis C. Sexually transmitted infections (STIs) Get screened for STIs, including gonorrhea and chlamydia, if: You are sexually active and are younger than 82 years of age. You are older than 82 years of age and your health care provider tells you that you are at risk for this type of infection. Your sexual activity has changed since you were last screened, and you are at increased risk for chlamydia or gonorrhea. Ask your health care provider if you are at risk. Ask your health care provider about whether you are at high risk for HIV. Your health care provider may recommend a prescription medicine to help prevent HIV infection. If you choose to take medicine to prevent HIV, you should first get tested for HIV. You should then be tested every 3 months for as long as you are taking the medicine. Pregnancy If you are about to stop having your period (premenopausal) and you may become pregnant, seek counseling before you get pregnant. Take 400 to 800 micrograms (mcg) of folic acid every day if you become pregnant. Ask for birth control (contraception) if you want to prevent pregnancy. Osteoporosis  and menopause Osteoporosis is a disease in which the bones lose minerals and strength with aging. This can result in bone fractures. If you are 53 years old or older, or if you are at risk for osteoporosis and fractures, ask your health care provider if you should: Be screened for bone loss. Take a calcium or vitamin D supplement to lower your risk of fractures. Be given hormone replacement therapy (HRT) to treat symptoms of menopause. Follow these instructions at home: Alcohol use Do not drink alcohol if: Your health care provider tells you not to drink. You are pregnant, may be pregnant, or are planning to become pregnant. If you drink alcohol: Limit how much you have to: 0-1 drink a day. Know how much alcohol is in your drink. In the  [place] one drink equals one 12 oz bottle of beer (355 mL), one 5 oz glass of wine (148 mL), or one 1 oz glass of hard liquor (44 mL). Lifestyle Do not use any products that contain nicotine or tobacco. These products include cigarettes, chewing tobacco, and vaping devices, such as e-cigarettes. If you need help quitting, ask your health care provider. Do not use street drugs. Do not share needles. Ask your health care provider for help if you need support or information about quitting drugs. General instructions Schedule regular health, dental, and eye exams. Stay current with your vaccines. Tell your health care provider if: You often feel depressed. You have ever been abused or do not feel safe at home. Summary Adopting a healthy lifestyle and getting preventive care are important in promoting health and wellness. Follow your health care provider's instructions about healthy diet, exercising, and getting tested or screened for diseases. Follow your health care provider's instructions on monitoring your cholesterol and blood pressure. This information is not intended to replace advice given to you by your health care provider. Make sure you discuss any questions you have with your health care provider. Document Revised: 06/21/2020 Document Reviewed: 06/21/2020 Elsevier Patient Education  [place].

## 2021-03-14 ENCOUNTER — Encounter: Payer: Self-pay | Admitting: Cardiology

## 2021-03-14 ENCOUNTER — Ambulatory Visit (INDEPENDENT_AMBULATORY_CARE_PROVIDER_SITE_OTHER): Payer: Medicare Other | Admitting: Cardiology

## 2021-03-14 ENCOUNTER — Other Ambulatory Visit: Payer: Self-pay

## 2021-03-14 VITALS — BP 140/72 | HR 58 | Ht 65.0 in | Wt 133.0 lb

## 2021-03-14 DIAGNOSIS — I4819 Other persistent atrial fibrillation: Secondary | ICD-10-CM

## 2021-03-14 NOTE — Progress Notes (Signed)
Electrophysiology Office Note   Date:  03/14/2021   ID:  Carmen Cooper, DOB 09-21-1940, MRN [id number]  PCP:  Luetta Nutting, DO  Cardiologist:   Primary Electrophysiologist:  Lemoine Goyne Meredith Leeds, MD    Chief Complaint: SOB   History of Present Illness: Carmen Cooper is a 81 y.o. female who is being seen today for the evaluation of AF at the request of Luetta Nutting, DO. Presenting today for electrophysiology evaluation.  She has a history significant for hypertension, aortic atherosclerosis, diabetes, CKD.  She developed chest pain and shortness of breath and was found to be in atrial fibrillation by her primary physician.  Her ejection fraction was found to be 40 to 45%.  Today, denies symptoms of palpitations, chest pain, shortness of breath, orthopnea, PND, lower extremity edema, claudication, dizziness, presyncope, syncope, bleeding, or neurologic sequela. The patient is tolerating medications without difficulties.  She is continuing to have short episodes of palpitations.  Her episodes last a few minutes at a time.  She has not found any triggers for her palpitations.  She is not overly concerned about these.  She is overall happy with her control.   Past Medical History:  Diagnosis Date   Atrial fibrillation ([place])    BCC (basal cell carcinoma of skin)    Diabetes ([place])    Glaucoma    History of TIA (transient ischemic attack) 08/11/2013   12/2012 - [name]    Hypertension    Hypothyroidism 08/11/2013   Memory changes    Microscopic colitis 08/21/2013   2008 [facility] [name].  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, [facility]    Past Surgical History:  Procedure Laterality Date   CARDIOVERSION N/A 02/12/2019   Procedure: CARDIOVERSION;  Surgeon: [name], MD;  Location: [place];  Service: Cardiovascular;  Laterality: N/A;   MOHS SURGERY  2019   Nose bcc     OTHER SURGICAL HISTORY  04/01/2019   biopsy on nose and lip      Current Outpatient Medications  Medication Sig Dispense Refill   AMBULATORY NON FORMULARY MEDICATION Freestyle light test strips Test twice a day  Dx type 2 diabetes E11.9 100 each 11   apixaban (ELIQUIS) 5 MG TABS tablet Take 5 mg by mouth 2 (two) times daily.     atorvastatin (LIPITOR) 40 MG tablet TAKE 1 TABLET DAILY 90 tablet 3   BAQSIMI TWO PACK 3 MG/DOSE POWD Place into both nostrils.     COSOPT PF 2-0.5 % SOLN ophthalmic solution Place 1 drop into both eyes in the morning and at bedtime.     diltiazem (CARDIZEM SR) 60 MG 12 hr capsule TAKE 1 CAPSULE(60 MG) BY MOUTH TWICE DAILY 732 capsule 0   folic acid (FOLVITE) 1 MG tablet Take 1 mg by mouth daily.      FREESTYLE LITE test strip      Insulin Disposable Pump (OMNIPOD DASH 5 PACK PODS) MISC Inject into the skin as directed.     insulin glargine (LANTUS SOLOSTAR) 100 UNIT/ML Solostar Pen Use in case of pump failure: Remove pump and take 15 units of Lantus insulin as basal insulin in case of insulin pump malfunction/failure. .Do not place pump back on until 20 hours AFTER last dose of Lantus to prevent "double basal infusion."     Insulin Human (INSULIN PUMP) SOLN Inject into the skin as directed.  insulin lispro (HUMALOG) 100 UNIT/ML     insulin lispro (HUMALOG) 100 UNIT/ML injection Medtronic 630G pump.  Basal 12-6a 0.625, 6a-7p 0.725, 7p-12a 0.625.  Preset bolus:  4/5/6.  ISF 50.  Total daily dose:  40 units/day     Lifitegrast (XIIDRA) 5 % SOLN Place 1 drop into both eyes daily.      lisinopril (ZESTRIL) 20 MG tablet Take 0.5 tablets (10 mg total) by mouth daily. 45 tablet 3   Multiple Vitamins-Minerals (CENTRUM SILVER 50+WOMEN PO) Take 1 tablet by mouth daily.      nepafenac (NEVANAC) 0.1 % ophthalmic suspension Place 1 drop into the right eye 3 (three) times daily.      ofloxacin (OCUFLOX) 0.3 % ophthalmic solution Place 1 drop into the right eye 4 times daily.      Omega-3 1000 MG CAPS Take 1,000 mg by mouth daily.      SURE COMFORT PEN NEEDLES 31G X 8 MM MISC      TIROSINT 100 MCG CAPS Take 1 capsule by mouth daily.     valACYclovir (VALTREX) 1000 MG tablet Take 1,000 mg by mouth daily.     ZIOPTAN 0.0015 % SOLN Place 1 drop into both eyes at bedtime.      mycophenolate (CELLCEPT) 500 MG tablet Take by mouth.     No current facility-administered medications for this visit.    Allergies:   Dexamethasone, Brimonidine tartrate, Clindamycin/lincomycin, Sulfa antibiotics, and Valacyclovir hcl   Social History:  The patient  reports that she has quit smoking. Her smoking use included cigarettes. She has a 5.00 pack-year smoking history. She has never used smokeless tobacco. She reports that she does not currently use alcohol. She reports that she does not use drugs.   Family History:  The patient's family history includes Cancer in her mother; Heart attack in her father; Heart disease in her son; Hypertension in her mother.   ROS:  Please see the history of present illness.   Otherwise, review of systems is positive for none.   All other systems are reviewed and negative.   PHYSICAL EXAM: VS:  BP 140/72    Pulse (!) 58    Ht 5\' 5"  (1.651 m)    Wt 133 lb (60.3 kg)    SpO2 98%    BMI 22.13 kg/m  , BMI Body mass index is 22.13 kg/m. GEN: Well nourished, well developed, in no acute distress  HEENT: normal  Neck: no JVD, carotid bruits, or masses Cardiac: RRR; no murmurs, rubs, or gallops,no edema  Respiratory:  clear to auscultation bilaterally, normal work of breathing GI: soft, nontender, nondistended, + BS MS: no deformity or atrophy  Skin: warm and dry Neuro:  Strength and sensation are intact Psych: euthymic mood, full affect  EKG:  EKG is ordered today. Personal review of the ekg ordered shows sinus rhythm, rate 58  Recent Labs: 11/02/2020: Hemoglobin 13.7; Platelets 241    Lipid Panel     Component Value Date/Time   CHOL 96 03/27/2017  0000   TRIG 75 03/27/2017 0000   HDL 57 03/27/2017 0000   CHOLHDL 1.8 08/11/2013 0934   VLDL 12 08/11/2013 0934   LDLCALC 28 03/27/2017 0000   LDLDIRECT 38 04/03/2018 0853     Wt Readings from Last 3 Encounters:  03/14/21 133 lb (60.3 kg)  03/09/21 138 lb (62.6 kg)  12/13/20 134 lb 1.9 oz (60.8 kg)      Other studies Reviewed: Additional studies/ records that were reviewed today  include: TTE 01/08/19  Review of the above records today demonstrates:   1. Left ventricular ejection fraction, by visual estimation, is 40 to 45%. The left ventricle has moderately decreased function. There is global hypokinesis of the left ventricle. Left ventricular septal wall thickness was mildly increased. Mildly  increased left ventricular posterior wall thickness. There is mildly increased left ventricular hypertrophy.  2. Left ventricular diastolic parameters are indeterminate.  3. Global right ventricular systolic function is normal visually.The right ventricular size is normal. No increase in right ventricular wall thickness.  4. Left atrial size was severely dilated.  5. Right atrial size was severely dilated.  6. The mitral valve is normal in structure. Mild to moderate mitral valve regurgitation. No evidence of mitral stenosis.  7. The tricuspid valve is normal in structure. Tricuspid valve regurgitation moderate.  8. The aortic valve is normal in structure. Aortic valve regurgitation is not visualized. No evidence of aortic valve sclerosis or stenosis.  9. The pulmonic valve was not well visualized. Pulmonic valve regurgitation is not visualized. 10. Trivial circumferential pericardial effusion is present. 11. Mildly elevated pulmonary artery systolic pressure.   ASSESSMENT AND PLAN:  1.  Persistent atrial fibrillation: Currently on Toprol-XL and Eliquis.  CHA2DS2-VASc of 5.  She has had minimal episodes of atrial fibrillation and certainly none that are prolonged.  She remains in normal  rhythm the vast majority of the time.  She is overall happy with her control.  No changes.  2.  Chronic systolic heart failure: Ejection fraction 40 to 45%.  Potentially a tachycardia mediated cardiomyopathy.  Continue lisinopril and Toprol-XL.  3.  PVCs: Low burden of less than 1%.  Continue to monitor.   Current medicines are reviewed at length with the patient today.   The patient does not have concerns regarding her medicines.  The following changes were made today: None  Labs/ tests ordered today include:  Orders Placed This Encounter  Procedures   EKG 12-Lead    Disposition:   FU with Willett Lefeber 6 months  Signed, Samuele Storey Meredith Leeds, MD  03/14/2021 3:27 PM     [facility] [address] [place] [address] [phone] (office) [phone] (fax)

## 2021-03-23 ENCOUNTER — Ambulatory Visit (INDEPENDENT_AMBULATORY_CARE_PROVIDER_SITE_OTHER): Payer: Medicare Other

## 2021-03-23 ENCOUNTER — Other Ambulatory Visit: Payer: Self-pay | Admitting: Family Medicine

## 2021-03-23 ENCOUNTER — Other Ambulatory Visit: Payer: Self-pay

## 2021-03-23 DIAGNOSIS — Z0000 Encounter for general adult medical examination without abnormal findings: Secondary | ICD-10-CM | POA: Diagnosis not present

## 2021-03-23 DIAGNOSIS — M810 Age-related osteoporosis without current pathological fracture: Secondary | ICD-10-CM | POA: Diagnosis not present

## 2021-03-23 DIAGNOSIS — Z780 Asymptomatic menopausal state: Secondary | ICD-10-CM | POA: Diagnosis not present

## 2021-03-23 DIAGNOSIS — Z1231 Encounter for screening mammogram for malignant neoplasm of breast: Secondary | ICD-10-CM

## 2021-03-23 DIAGNOSIS — M8588 Other specified disorders of bone density and structure, other site: Secondary | ICD-10-CM | POA: Diagnosis not present

## 2021-03-29 ENCOUNTER — Telehealth: Payer: Self-pay

## 2021-03-29 NOTE — Telephone Encounter (Signed)
Advised Carmen Cooper of DEXA scan results and Mammogram. She expressed understanding.

## 2021-04-04 DIAGNOSIS — H30031 Focal chorioretinal inflammation, peripheral, right eye: Secondary | ICD-10-CM | POA: Diagnosis not present

## 2021-04-04 DIAGNOSIS — H44111 Panuveitis, right eye: Secondary | ICD-10-CM | POA: Diagnosis not present

## 2021-04-04 DIAGNOSIS — H353221 Exudative age-related macular degeneration, left eye, with active choroidal neovascularization: Secondary | ICD-10-CM | POA: Diagnosis not present

## 2021-04-04 DIAGNOSIS — H3581 Retinal edema: Secondary | ICD-10-CM | POA: Diagnosis not present

## 2021-04-04 DIAGNOSIS — Z961 Presence of intraocular lens: Secondary | ICD-10-CM | POA: Diagnosis not present

## 2021-04-04 DIAGNOSIS — H209 Unspecified iridocyclitis: Secondary | ICD-10-CM | POA: Diagnosis not present

## 2021-04-04 DIAGNOSIS — H4041X1 Glaucoma secondary to eye inflammation, right eye, mild stage: Secondary | ICD-10-CM | POA: Diagnosis not present

## 2021-04-04 DIAGNOSIS — H353212 Exudative age-related macular degeneration, right eye, with inactive choroidal neovascularization: Secondary | ICD-10-CM | POA: Diagnosis not present

## 2021-04-04 DIAGNOSIS — Z79899 Other long term (current) drug therapy: Secondary | ICD-10-CM | POA: Diagnosis not present

## 2021-04-04 DIAGNOSIS — H401120 Primary open-angle glaucoma, left eye, stage unspecified: Secondary | ICD-10-CM | POA: Diagnosis not present

## 2021-04-11 ENCOUNTER — Ambulatory Visit: Payer: Medicare Other | Admitting: Cardiology

## 2021-04-12 ENCOUNTER — Ambulatory Visit: Payer: Medicare Other | Admitting: Cardiology

## 2021-04-28 DIAGNOSIS — E039 Hypothyroidism, unspecified: Secondary | ICD-10-CM | POA: Diagnosis not present

## 2021-05-06 DIAGNOSIS — E039 Hypothyroidism, unspecified: Secondary | ICD-10-CM | POA: Diagnosis not present

## 2021-05-06 DIAGNOSIS — N183 Chronic kidney disease, stage 3 unspecified: Secondary | ICD-10-CM | POA: Diagnosis not present

## 2021-05-06 DIAGNOSIS — E1022 Type 1 diabetes mellitus with diabetic chronic kidney disease: Secondary | ICD-10-CM | POA: Diagnosis not present

## 2021-05-06 DIAGNOSIS — E1049 Type 1 diabetes mellitus with other diabetic neurological complication: Secondary | ICD-10-CM | POA: Diagnosis not present

## 2021-05-09 ENCOUNTER — Other Ambulatory Visit: Payer: Self-pay | Admitting: Family Medicine

## 2021-05-09 DIAGNOSIS — R002 Palpitations: Secondary | ICD-10-CM

## 2021-05-10 DIAGNOSIS — Z20822 Contact with and (suspected) exposure to covid-19: Secondary | ICD-10-CM | POA: Diagnosis not present

## 2021-05-30 DIAGNOSIS — E119 Type 2 diabetes mellitus without complications: Secondary | ICD-10-CM | POA: Diagnosis not present

## 2021-05-30 DIAGNOSIS — H4043X3 Glaucoma secondary to eye inflammation, bilateral, severe stage: Secondary | ICD-10-CM | POA: Diagnosis not present

## 2021-05-30 DIAGNOSIS — Z794 Long term (current) use of insulin: Secondary | ICD-10-CM | POA: Diagnosis not present

## 2021-05-30 DIAGNOSIS — Z961 Presence of intraocular lens: Secondary | ICD-10-CM | POA: Diagnosis not present

## 2021-05-30 DIAGNOSIS — H209 Unspecified iridocyclitis: Secondary | ICD-10-CM | POA: Diagnosis not present

## 2021-06-12 DIAGNOSIS — Z20822 Contact with and (suspected) exposure to covid-19: Secondary | ICD-10-CM | POA: Diagnosis not present

## 2021-06-15 DIAGNOSIS — Z20822 Contact with and (suspected) exposure to covid-19: Secondary | ICD-10-CM | POA: Diagnosis not present

## 2021-06-17 ENCOUNTER — Other Ambulatory Visit: Payer: Self-pay | Admitting: Cardiology

## 2021-06-17 DIAGNOSIS — R0789 Other chest pain: Secondary | ICD-10-CM

## 2021-06-17 DIAGNOSIS — I4891 Unspecified atrial fibrillation: Secondary | ICD-10-CM

## 2021-06-17 NOTE — Telephone Encounter (Addendum)
Prescription refill request for Eliquis received. ?Indication: Afib  ?Last office visit:03/14/21 ([place])  ?Scr:  0.95 (04/04/21) ?Age: 81 ?Weight: 60.3kg ? ? ?Current dosage does not meet dosing criteria (weight >60kg); however, Marcelle Overlie, Pharmacist reviewed and stated since weight is only slightly above 60kg, pt should remain on 2.'5mg'$  BID at this time. Refill sent to requested pharmacy.  ? ?

## 2021-06-20 DIAGNOSIS — E039 Hypothyroidism, unspecified: Secondary | ICD-10-CM | POA: Diagnosis not present

## 2021-06-22 ENCOUNTER — Telehealth: Payer: Self-pay | Admitting: Cardiology

## 2021-06-22 DIAGNOSIS — I4891 Unspecified atrial fibrillation: Secondary | ICD-10-CM

## 2021-06-22 DIAGNOSIS — R0789 Other chest pain: Secondary | ICD-10-CM

## 2021-06-22 NOTE — Telephone Encounter (Deleted)
Prescription refill request for Eliquis received. ?Indication: Atrial Fib ?Last office visit: 03/14/21  Elliot Cousin MD ?Scr: 0.95 on 04/04/21 ?Age: 81 ?Weight: 60.3kg ? ?Based on above findings Eliquis '5mg'$  twice daily is the appropriate dose.  Will need to monitor weight closely.  Refill approved. ? ?

## 2021-06-22 NOTE — Telephone Encounter (Signed)
? ? ?*  STAT* If patient is at the pharmacy, call can be transferred to refill team. ? ? ?1. Which medications need to be refilled? (please list name of each medication and dose if known)  ? ?apixaban (ELIQUIS) 2.5 MG TABS tablet ? ?2. Which pharmacy/location (including street and city if local pharmacy) is medication to be sent to? ? ?[facility] [place], [place] ? ?3. Do they need a 30 day or 90 day supply? 90 ? ? ?[facility] told patient today that they do not have the new prescription that was sent on 06/17/21. Please resend   ?

## 2021-06-23 MED ORDER — APIXABAN 2.5 MG PO TABS: ORAL_TABLET | ORAL | 1 refills | Status: DC

## 2021-06-23 NOTE — Telephone Encounter (Signed)
Prescription refill request for Eliquis received. ?Indication:Afib  ?Last office visit: 03/14/21 [facility])  ?Scr: 0.95 (2/20/3) ?Age: 81 ?Weight: 60.3kg ? ?Current dosage does not meet dosing criteria (weight >60kg); however, Marcelle Overlie, Pharmacist reviewed and stated since weight is only slightly above 60kg, pt should remain on 2.'5mg'$  BID at this time. Refill sent to requested pharmacy.  ? ?

## 2021-06-25 DIAGNOSIS — Z87891 Personal history of nicotine dependence: Secondary | ICD-10-CM | POA: Diagnosis not present

## 2021-06-25 DIAGNOSIS — S4991XA Unspecified injury of right shoulder and upper arm, initial encounter: Secondary | ICD-10-CM | POA: Diagnosis not present

## 2021-06-25 DIAGNOSIS — Z8673 Personal history of transient ischemic attack (TIA), and cerebral infarction without residual deficits: Secondary | ICD-10-CM | POA: Diagnosis not present

## 2021-06-25 DIAGNOSIS — Z794 Long term (current) use of insulin: Secondary | ICD-10-CM | POA: Diagnosis not present

## 2021-06-25 DIAGNOSIS — R296 Repeated falls: Secondary | ICD-10-CM | POA: Diagnosis not present

## 2021-06-25 DIAGNOSIS — M25519 Pain in unspecified shoulder: Secondary | ICD-10-CM | POA: Diagnosis not present

## 2021-06-25 DIAGNOSIS — E109 Type 1 diabetes mellitus without complications: Secondary | ICD-10-CM | POA: Diagnosis not present

## 2021-06-25 DIAGNOSIS — S060X0A Concussion without loss of consciousness, initial encounter: Secondary | ICD-10-CM | POA: Diagnosis not present

## 2021-06-25 DIAGNOSIS — Z883 Allergy status to other anti-infective agents status: Secondary | ICD-10-CM | POA: Diagnosis not present

## 2021-06-25 DIAGNOSIS — S0990XA Unspecified injury of head, initial encounter: Secondary | ICD-10-CM | POA: Diagnosis not present

## 2021-06-25 DIAGNOSIS — Z7901 Long term (current) use of anticoagulants: Secondary | ICD-10-CM | POA: Diagnosis not present

## 2021-06-25 DIAGNOSIS — Z23 Encounter for immunization: Secondary | ICD-10-CM | POA: Diagnosis not present

## 2021-06-25 DIAGNOSIS — S8991XA Unspecified injury of right lower leg, initial encounter: Secondary | ICD-10-CM | POA: Diagnosis not present

## 2021-06-25 DIAGNOSIS — S0003XA Contusion of scalp, initial encounter: Secondary | ICD-10-CM | POA: Diagnosis not present

## 2021-06-25 DIAGNOSIS — R531 Weakness: Secondary | ICD-10-CM | POA: Diagnosis not present

## 2021-06-25 DIAGNOSIS — Z888 Allergy status to other drugs, medicaments and biological substances status: Secondary | ICD-10-CM | POA: Diagnosis not present

## 2021-06-25 DIAGNOSIS — W19XXXA Unspecified fall, initial encounter: Secondary | ICD-10-CM | POA: Diagnosis not present

## 2021-06-25 DIAGNOSIS — I10 Essential (primary) hypertension: Secondary | ICD-10-CM | POA: Diagnosis not present

## 2021-06-25 DIAGNOSIS — I4891 Unspecified atrial fibrillation: Secondary | ICD-10-CM | POA: Diagnosis not present

## 2021-06-25 DIAGNOSIS — S098XXA Other specified injuries of head, initial encounter: Secondary | ICD-10-CM | POA: Diagnosis not present

## 2021-06-27 ENCOUNTER — Telehealth: Payer: Self-pay | Admitting: General Practice

## 2021-06-27 DIAGNOSIS — H353212 Exudative age-related macular degeneration, right eye, with inactive choroidal neovascularization: Secondary | ICD-10-CM | POA: Diagnosis not present

## 2021-06-27 DIAGNOSIS — H44111 Panuveitis, right eye: Secondary | ICD-10-CM | POA: Diagnosis not present

## 2021-06-27 DIAGNOSIS — H30031 Focal chorioretinal inflammation, peripheral, right eye: Secondary | ICD-10-CM | POA: Diagnosis not present

## 2021-06-27 DIAGNOSIS — H401120 Primary open-angle glaucoma, left eye, stage unspecified: Secondary | ICD-10-CM | POA: Diagnosis not present

## 2021-06-27 DIAGNOSIS — H4041X1 Glaucoma secondary to eye inflammation, right eye, mild stage: Secondary | ICD-10-CM | POA: Diagnosis not present

## 2021-06-27 DIAGNOSIS — H3581 Retinal edema: Secondary | ICD-10-CM | POA: Diagnosis not present

## 2021-06-27 DIAGNOSIS — H209 Unspecified iridocyclitis: Secondary | ICD-10-CM | POA: Diagnosis not present

## 2021-06-27 DIAGNOSIS — Z79899 Other long term (current) drug therapy: Secondary | ICD-10-CM | POA: Diagnosis not present

## 2021-06-27 DIAGNOSIS — Z961 Presence of intraocular lens: Secondary | ICD-10-CM | POA: Diagnosis not present

## 2021-06-27 DIAGNOSIS — H353221 Exudative age-related macular degeneration, left eye, with active choroidal neovascularization: Secondary | ICD-10-CM | POA: Diagnosis not present

## 2021-06-27 NOTE — Telephone Encounter (Signed)
Transition Care Management Unsuccessful Follow-up Telephone Call ? ?Date of discharge and from where:  06/25/21 from [place] ? ?Attempts:  1st Attempt ? ?Reason for unsuccessful TCM follow-up call:  Left voice message ? ?  ?

## 2021-06-28 NOTE — Telephone Encounter (Signed)
Transition Care Management Unsuccessful Follow-up Telephone Call ? ?Date of discharge and from where:  06/25/21 from [place] ? ?Attempts:  2nd Attempt ? ?Reason for unsuccessful TCM follow-up call:  Left voice message ? ?  ?

## 2021-06-29 NOTE — Telephone Encounter (Signed)
Transition Care Management Unsuccessful Follow-up Telephone Call ? ?Date of discharge and from where:  06/26/11 from [place] ? ?Attempts:  3rd Attempt ? ?Reason for unsuccessful TCM follow-up call:  Left voice message ? ?  ?

## 2021-08-15 ENCOUNTER — Encounter: Payer: Self-pay | Admitting: Family Medicine

## 2021-08-15 ENCOUNTER — Ambulatory Visit (INDEPENDENT_AMBULATORY_CARE_PROVIDER_SITE_OTHER): Payer: Medicare Other | Admitting: Family Medicine

## 2021-08-15 ENCOUNTER — Ambulatory Visit (INDEPENDENT_AMBULATORY_CARE_PROVIDER_SITE_OTHER): Payer: Medicare Other

## 2021-08-15 VITALS — BP 143/81 | HR 66 | Ht 65.0 in | Wt 135.0 lb

## 2021-08-15 DIAGNOSIS — E1039 Type 1 diabetes mellitus with other diabetic ophthalmic complication: Secondary | ICD-10-CM | POA: Diagnosis not present

## 2021-08-15 DIAGNOSIS — I4891 Unspecified atrial fibrillation: Secondary | ICD-10-CM | POA: Diagnosis not present

## 2021-08-15 DIAGNOSIS — G8929 Other chronic pain: Secondary | ICD-10-CM

## 2021-08-15 DIAGNOSIS — M25561 Pain in right knee: Secondary | ICD-10-CM | POA: Diagnosis not present

## 2021-08-15 DIAGNOSIS — M1711 Unilateral primary osteoarthritis, right knee: Secondary | ICD-10-CM

## 2021-08-15 DIAGNOSIS — E1159 Type 2 diabetes mellitus with other circulatory complications: Secondary | ICD-10-CM | POA: Diagnosis not present

## 2021-08-15 DIAGNOSIS — I152 Hypertension secondary to endocrine disorders: Secondary | ICD-10-CM | POA: Diagnosis not present

## 2021-08-15 DIAGNOSIS — E031 Congenital hypothyroidism without goiter: Secondary | ICD-10-CM | POA: Diagnosis not present

## 2021-08-15 DIAGNOSIS — M25461 Effusion, right knee: Secondary | ICD-10-CM | POA: Diagnosis not present

## 2021-08-15 LAB — POCT GLYCOSYLATED HEMOGLOBIN (HGB A1C): HbA1c, POC (controlled diabetic range): 8 % — AB (ref 0.0–7.0)

## 2021-08-15 LAB — GLUCOSE, POCT (MANUAL RESULT ENTRY): POC Glucose: 339 mg/dL — AB (ref 70–99)

## 2021-08-15 NOTE — Progress Notes (Signed)
Carmen Cooper - 81 y.o. female MRN [id number]  Date of birth: [birth year]  Subjective Chief Complaint  Patient presents with   Hypertension    HPI Carmen Cooper is a 81 y.o. female here today for follow up visit.  Reports that she is doing well today.  She has continued to receive eye injections for macular degeneration.  This is stable, and vision has actually improved some.    Type 1 diabetes is managed by endorcinology.  Last a1c was 8.5%.  Denies episodes of hypoglycemia.  Has another upcoming follow-up next week.   Remains on diltiazem and lisinopril for management of HTN and A. Fib.  She does see cardiology for A. Fib.  She is anticoagulated with eliquis.  Denies side effects from medications.  She denies chest pain, shortness of breath, palpitations, headache.   Continues to have R knee pain.  Worse when standing after sitting for prolonged period of time.  She does have swelling of the knee.  Has tried voltaren gel which provides some temporary relief.    ROS:  A comprehensive ROS was completed and negative except as noted per HPI  Allergies  Allergen Reactions   Dexamethasone Anaphylaxis and Other (See Comments)    Blood sugar elevated     Brimonidine Tartrate Other (See Comments)    Burning and redness    Clindamycin/Lincomycin Rash   Sulfa Antibiotics Rash   Valacyclovir Hcl Rash    Past Medical History:  Diagnosis Date   Atrial fibrillation ([place])    BCC (basal cell carcinoma of skin)    Diabetes ([place])    Glaucoma    History of TIA (transient ischemic attack) 08/11/2013   12/2012 - [name]    Hypertension    Hypothyroidism 08/11/2013   Memory changes    Microscopic colitis 08/21/2013   2008 - [facility] [name].  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, [facility]     Past Surgical History:  Procedure Laterality Date   BREAST EXCISIONAL BIOPSY Left    BREAST  EXCISIONAL BIOPSY Left    CARDIOVERSION N/A 02/12/2019   Procedure: CARDIOVERSION;  Surgeon: [name], MD;  Location: [facility];  Service: Cardiovascular;  Laterality: N/A;   MOHS SURGERY  2019   Nose bcc    OTHER SURGICAL HISTORY  04/01/2019   biopsy on nose and lip     Social History   Socioeconomic History   Marital status: Widowed    Spouse name: [name]   Number of children: 1   Years of education: 12   Highest education level: 12th grade  Occupational History   Occupation: Retired    Comment: retired  Tobacco Use   Smoking status: Former    Packs/day: 0.25    Years: 20.00    Total pack years: 5.00    Types: Cigarettes   Smokeless tobacco: Never  Vaping Use   Vaping Use: Never used  Substance and Sexual Activity   Alcohol use: Not Currently   Drug use: No   Sexual activity: Not Currently    Partners: Male  Other Topics Concern   Not on file  Social History Narrative   Lives alone. She has one son, who takes her to her doctor's appointments. She enjoys crochet and gardening.   Social Determinants of Health   Financial Resource Strain: Low Risk  (03/11/2021)   Overall Financial Resource Strain (CARDIA)  Difficulty of Paying Living Expenses: Not hard at all  Food Insecurity: No Food Insecurity (03/11/2021)   Hunger Vital Sign    Worried About Running Out of Food in the Last Year: Never true    Ran Out of Food in the Last Year: Never true  Transportation Needs: No Transportation Needs (03/11/2021)   PRAPARE - Hydrologist (Medical): No    Lack of Transportation (Non-Medical): No  Physical Activity: Sufficiently Active (03/11/2021)   Exercise Vital Sign    Days of Exercise per Week: 7 days    Minutes of Exercise per Session: 40 min  Stress: No Stress Concern Present (03/11/2021)   [place]    Feeling of Stress : Not at all  Social Connections: Socially  Isolated (03/11/2021)   Social Connection and Isolation Panel [NHANES]    Frequency of Communication with Friends and Family: Twice a week    Frequency of Social Gatherings with Friends and Family: Never    Attends Religious Services: Never    Marine scientist or Organizations: No    Attends Archivist Meetings: Never    Marital Status: Widowed    Family History  Problem Relation Age of Onset   Heart disease Son    Hypertension Mother    Cancer Mother        unsure of origin   Heart attack Father    Diabetes Neg Hx     Health Maintenance  Topic Date Due   Zoster Vaccines- Shingrix (1 of 2) Never done   COVID-19 Vaccine (5 - Booster for Pfizer series) 01/20/2020   HEMOGLOBIN A1C  06/22/2021   OPHTHALMOLOGY EXAM  07/21/2021   INFLUENZA VACCINE  09/13/2021   FOOT EXAM  03/09/2022   TETANUS/TDAP  06/26/2031   Pneumonia Vaccine 39+ Years old  Completed   DEXA SCAN  Completed   HPV VACCINES  Aged Out     ----------------------------------------------------------------------------------------------------------------------------------------------------------------------------------------------------------------- Physical Exam BP (!) 143/81 (BP Location: Left Arm, Patient Position: Sitting, Cuff Size: Normal)   Pulse 66   Ht '5\' 5"'$  (1.651 m)   Wt 135 lb (61.2 kg)   BMI 22.47 kg/m   Physical Exam Constitutional:      Appearance: Normal appearance.  Eyes:     General: No scleral icterus. Cardiovascular:     Rate and Rhythm: Normal rate and regular rhythm.  Pulmonary:     Effort: Pulmonary effort is normal.     Breath sounds: Normal breath sounds.  Musculoskeletal:     Cervical back: Neck supple.     Comments: R knee swelling.  Mild pain and limitation with full extension  Neurological:     General: No focal deficit present.     Mental Status: She is alert.  Psychiatric:        Mood and Affect: Mood normal.        Behavior: Behavior normal.      ------------------------------------------------------------------------------------------------------------------------------------------------------------------------------------------------------------------- Assessment and Plan  Hypertension associated with diabetes (HCC) BP fairly well controlled for age.  Recommend continuation of current medications.    Atrial fibrillation ([place]) RRR on exam today.  Management per cardiology. She'll continue diltiazem for rate control and eliquis for anticoagulation.    Type 1 diabetes ([place]) Management per endocrinology. Has upcoming visit with endocrinology.   Hypothyroidism Stable, managed by endocrinology.   Right knee DJD Updated xrays ordered today.    No orders of the defined types were placed in  this encounter.   No follow-ups on file.    This visit occurred during the SARS-CoV-2 public health emergency.  Safety protocols were in place, including screening questions prior to the visit, additional usage of staff PPE, and extensive cleaning of exam room while observing appropriate contact time as indicated for disinfecting solutions.

## 2021-08-15 NOTE — Patient Instructions (Signed)
Great to see you today! We'll be in touch with xray results.  I would like for you to see Dr. Dianah Field (Dr. Darene Lamer) for the knee Follow up with me in 6 months.

## 2021-08-15 NOTE — Assessment & Plan Note (Signed)
BP fairly well controlled for age.  Recommend continuation of current medications.

## 2021-08-15 NOTE — Assessment & Plan Note (Signed)
Stable, managed by endocrinology.

## 2021-08-15 NOTE — Addendum Note (Signed)
Addended by: Peggye Ley on: 08/15/2021 09:44 AM   Modules accepted: Orders

## 2021-08-15 NOTE — Assessment & Plan Note (Signed)
RRR on exam today.  Management per cardiology. She'll continue diltiazem for rate control and eliquis for anticoagulation.

## 2021-08-15 NOTE — Assessment & Plan Note (Signed)
Management per endocrinology. Has upcoming visit with endocrinology.

## 2021-08-15 NOTE — Assessment & Plan Note (Signed)
Updated xrays ordered today.

## 2021-08-22 DIAGNOSIS — E1022 Type 1 diabetes mellitus with diabetic chronic kidney disease: Secondary | ICD-10-CM | POA: Diagnosis not present

## 2021-08-22 DIAGNOSIS — E1021 Type 1 diabetes mellitus with diabetic nephropathy: Secondary | ICD-10-CM | POA: Diagnosis not present

## 2021-08-22 DIAGNOSIS — N183 Chronic kidney disease, stage 3 unspecified: Secondary | ICD-10-CM | POA: Diagnosis not present

## 2021-08-22 DIAGNOSIS — E039 Hypothyroidism, unspecified: Secondary | ICD-10-CM | POA: Diagnosis not present

## 2021-08-22 DIAGNOSIS — Z9641 Presence of insulin pump (external) (internal): Secondary | ICD-10-CM | POA: Diagnosis not present

## 2021-08-22 DIAGNOSIS — E1049 Type 1 diabetes mellitus with other diabetic neurological complication: Secondary | ICD-10-CM | POA: Diagnosis not present

## 2021-08-24 ENCOUNTER — Ambulatory Visit (INDEPENDENT_AMBULATORY_CARE_PROVIDER_SITE_OTHER): Payer: Medicare Other | Admitting: Sports Medicine

## 2021-08-24 DIAGNOSIS — M1731 Unilateral post-traumatic osteoarthritis, right knee: Secondary | ICD-10-CM | POA: Diagnosis not present

## 2021-08-24 MED ORDER — ACETAMINOPHEN ER 650 MG PO TBCR: 650.0000 mg | EXTENDED_RELEASE_TABLET | Freq: Three times a day (TID) | ORAL | 3 refills | Status: DC | PRN

## 2021-08-24 NOTE — Progress Notes (Signed)
    Procedures performed today:    None.  Independent interpretation of notes and tests performed by another provider:   None.  Brief History, Exam, Impression, and Recommendations:    Right knee DJD This is a very pleasant 81 year old female, she has a long history of right knee pain, likely localized anteriorly, she did have x-rays done that showed osteoarthritis. On exam she does have mild effusion, but does not really have any discrete areas of tenderness. We discussed the anatomy, pathophysiology, we will start with Tylenol, unable to use NSAIDs as she is on Eliquis. We will also get her to do some home conditioning, return to see me in 6 weeks, injection if no better.    ____________________________________________ [name]. Dianah Field, M.D., ABFM., CAQSM., AME. Primary Care and Sports Medicine [facility]  Adjunct Professor of [place] of [facility] of Medicine  Risk manager

## 2021-08-24 NOTE — Assessment & Plan Note (Signed)
This is a very pleasant 81 year old female, she has a long history of right knee pain, likely localized anteriorly, she did have x-rays done that showed osteoarthritis. On exam she does have mild effusion, but does not really have any discrete areas of tenderness. We discussed the anatomy, pathophysiology, we will start with Tylenol, unable to use NSAIDs as she is on Eliquis. We will also get her to do some home conditioning, return to see me in 6 weeks, injection if no better.

## 2021-09-21 ENCOUNTER — Other Ambulatory Visit: Payer: Self-pay | Admitting: Cardiology

## 2021-09-22 DIAGNOSIS — H44111 Panuveitis, right eye: Secondary | ICD-10-CM | POA: Diagnosis not present

## 2021-09-22 DIAGNOSIS — H209 Unspecified iridocyclitis: Secondary | ICD-10-CM | POA: Diagnosis not present

## 2021-09-22 DIAGNOSIS — H353221 Exudative age-related macular degeneration, left eye, with active choroidal neovascularization: Secondary | ICD-10-CM | POA: Diagnosis not present

## 2021-09-22 DIAGNOSIS — Z79899 Other long term (current) drug therapy: Secondary | ICD-10-CM | POA: Diagnosis not present

## 2021-09-22 DIAGNOSIS — Z961 Presence of intraocular lens: Secondary | ICD-10-CM | POA: Diagnosis not present

## 2021-09-22 DIAGNOSIS — H4041X1 Glaucoma secondary to eye inflammation, right eye, mild stage: Secondary | ICD-10-CM | POA: Diagnosis not present

## 2021-09-22 DIAGNOSIS — H30031 Focal chorioretinal inflammation, peripheral, right eye: Secondary | ICD-10-CM | POA: Diagnosis not present

## 2021-09-22 DIAGNOSIS — H3581 Retinal edema: Secondary | ICD-10-CM | POA: Diagnosis not present

## 2021-09-22 DIAGNOSIS — H401120 Primary open-angle glaucoma, left eye, stage unspecified: Secondary | ICD-10-CM | POA: Diagnosis not present

## 2021-09-22 DIAGNOSIS — H353212 Exudative age-related macular degeneration, right eye, with inactive choroidal neovascularization: Secondary | ICD-10-CM | POA: Diagnosis not present

## 2021-09-22 DIAGNOSIS — Z5181 Encounter for therapeutic drug level monitoring: Secondary | ICD-10-CM | POA: Diagnosis not present

## 2021-09-23 DIAGNOSIS — H1820 Unspecified corneal edema: Secondary | ICD-10-CM | POA: Diagnosis not present

## 2021-09-23 DIAGNOSIS — H4041X1 Glaucoma secondary to eye inflammation, right eye, mild stage: Secondary | ICD-10-CM | POA: Diagnosis not present

## 2021-09-23 DIAGNOSIS — H209 Unspecified iridocyclitis: Secondary | ICD-10-CM | POA: Diagnosis not present

## 2021-09-23 DIAGNOSIS — H30031 Focal chorioretinal inflammation, peripheral, right eye: Secondary | ICD-10-CM | POA: Diagnosis not present

## 2021-09-23 DIAGNOSIS — B0052 Herpesviral keratitis: Secondary | ICD-10-CM | POA: Diagnosis not present

## 2021-09-23 DIAGNOSIS — H401120 Primary open-angle glaucoma, left eye, stage unspecified: Secondary | ICD-10-CM | POA: Diagnosis not present

## 2021-09-23 DIAGNOSIS — Z947 Corneal transplant status: Secondary | ICD-10-CM | POA: Diagnosis not present

## 2021-10-05 ENCOUNTER — Telehealth: Payer: Self-pay | Admitting: Sports Medicine

## 2021-10-05 ENCOUNTER — Ambulatory Visit (INDEPENDENT_AMBULATORY_CARE_PROVIDER_SITE_OTHER): Payer: Medicare Other | Admitting: Sports Medicine

## 2021-10-05 DIAGNOSIS — M1731 Unilateral post-traumatic osteoarthritis, right knee: Secondary | ICD-10-CM

## 2021-10-05 NOTE — Telephone Encounter (Signed)
Visco approval please, right knee x-ray confirmed osteoarthritis.

## 2021-10-05 NOTE — Progress Notes (Signed)
    Procedures performed today:    None.  Independent interpretation of notes and tests performed by another provider:   None.  Brief History, Exam, Impression, and Recommendations:    Right knee DJD This pleasant 81 year old female returns, she has right knee osteoarthritis, she has done better with the home conditioning, she still has significant pain, gelling. She also is a brittle diabetic limiting her use of steroid injections, she is also on Eliquis so we cannot use NSAIDs. She is continuing to improve but due to persistent discomfort I think we should proceed with getting her approved for viscosupplementation.    ____________________________________________ [name]. Dianah Field, M.D., ABFM., CAQSM., AME. Primary Care and Sports Medicine  [facility]  Adjunct Professor of [place] [employer] [facility] of Medicine  Risk manager

## 2021-10-05 NOTE — Assessment & Plan Note (Signed)
This pleasant 81 year old female returns, she has right knee osteoarthritis, she has done better with the home conditioning, she still has significant pain, gelling. She also is a brittle diabetic limiting her use of steroid injections, she is also on Eliquis so we cannot use NSAIDs. She is continuing to improve but due to persistent discomfort I think we should proceed with getting her approved for viscosupplementation.

## 2021-10-05 NOTE — Telephone Encounter (Signed)
My[employer] paperwork faxed to [employer] at [phone] Request is for Orthovisc Pt's insurance prefers Orthovisc Fax confirmation receipt received  

## 2021-11-03 NOTE — Telephone Encounter (Signed)
Submitted patient's secondary coverage for benefits review. Fax confirmation rec'd

## 2021-11-09 NOTE — Telephone Encounter (Signed)
Had to submit a new Patient enrollment form for a benefits summary.

## 2021-11-10 NOTE — Telephone Encounter (Signed)
Call from [facility] that they need pt's SSN to verify tricare benefits.   Left VM for a return call with this information.

## 2021-11-15 NOTE — Telephone Encounter (Signed)
Benefits Investigation Details received from [employer] Injection: Orthovisc  Medical: PRIMARY: Deductible applies. Since the deductible has been met, pt is responsible for a coinsurance. SECONDARY: Plan follows MCR guidelines. It will pick up 100% eligible expenses. Does cover MCR part B deductible.  PA required: No  Pharmacy: Product not covered under pharmacy plan.   Specialty Pharmacy:   May fill through: Buy and Bill OV Copay/Coinsurance: 20% Product Copay: 20% Administration Coinsurance: 20% Administration Copay:  Deductible: $226 (met: $226) Out of Pocket Max:   Spoke with Son, Reita Cliche, who is aware of insurance coverage. He will discuss with patient and call back to schedule when they are ready to begin injections.

## 2021-12-05 ENCOUNTER — Telehealth: Payer: Self-pay | Admitting: Cardiology

## 2021-12-05 DIAGNOSIS — E119 Type 2 diabetes mellitus without complications: Secondary | ICD-10-CM | POA: Diagnosis not present

## 2021-12-05 DIAGNOSIS — Z794 Long term (current) use of insulin: Secondary | ICD-10-CM | POA: Diagnosis not present

## 2021-12-05 DIAGNOSIS — H209 Unspecified iridocyclitis: Secondary | ICD-10-CM | POA: Diagnosis not present

## 2021-12-05 DIAGNOSIS — Z961 Presence of intraocular lens: Secondary | ICD-10-CM | POA: Diagnosis not present

## 2021-12-05 DIAGNOSIS — H4043X3 Glaucoma secondary to eye inflammation, bilateral, severe stage: Secondary | ICD-10-CM | POA: Diagnosis not present

## 2021-12-05 NOTE — Telephone Encounter (Signed)
Patient with diagnosis of afib on Eliquis for anticoagulation.    Procedure: Revision of tube for left eye Date of procedure: 12/06/21  CHA2DS2-VASc Score = 8   This indicates a 10.8% annual risk of stroke. The patient's score is based upon: CHF History: 0 HTN History: 1 Diabetes History: 1 Stroke History: 2 Vascular Disease History: 1 Age Score: 2 Gender Score: 1      CrCl 41 ml/min  Per office protocol, patient can hold Eliquis for 1-2 days prior to procedure.  Due to hx of TIA would prefer 1 day hold, but may hold 2 days if needed.   **This guidance is not considered finalized until pre-operative APP has relayed final recommendations.**

## 2021-12-05 NOTE — Telephone Encounter (Signed)
I re-faxed the clearance notes to [phone]

## 2021-12-05 NOTE — Telephone Encounter (Signed)
Colletta Maryland from [facility] called to check on status of pre op request, she can be reached at [phone].

## 2021-12-05 NOTE — Telephone Encounter (Signed)
     Primary Cardiologist: Will Meredith Leeds, MD  Clinical pharmacist have reviewed Juliene Pina patient's chart and past medical history.  The following recommendations were made.  Patient with diagnosis of afib on Eliquis for anticoagulation.     Procedure: Revision of tube for left eye Date of procedure: 12/06/21   CHA2DS2-VASc Score = 8   This indicates a 10.8% annual risk of stroke. The patient's score is based upon: CHF History: 0 HTN History: 1 Diabetes History: 1 Stroke History: 2 Vascular Disease History: 1 Age Score: 2 Gender Score: 1       CrCl 41 ml/min   Per office protocol, patient can hold Eliquis for 1-2 days prior to procedure.  Due to hx of TIA would prefer 1 day hold, but may hold 2 days if needed.   I will route this recommendation to the requesting party via Epic fax function and remove from pre-op pool.  Please call with questions.  Jossie Ng. Leslye Puccini NP-C     12/05/2021, 2:47 PM [address] Office [phone] Fax [phone]

## 2021-12-05 NOTE — Telephone Encounter (Signed)
   Pre-operative Risk Assessment    Patient Name: Carmen Cooper  DOB: November 28, 1940 MRN: [id number]      Request for Surgical Clearance    Procedure:   Revision of tube for left eye  Date of Surgery:  12-06-21(tomorrow) Clearance                                  Surgeon:  Dr Jovita Kussmaul Surgeon's Group or Practice Name:   Phone number:  [phone] Fax number:  [phone]   Type of Clearance Requested:   - Pharmacy:  Hold Apixaban (Eliquis)     Type of Anesthesia:  MAC   Additional requests/questions:    Lorin Glass   12/05/2021, 11:35 AM

## 2021-12-05 NOTE — Telephone Encounter (Signed)
I s/w the pt's son who has been made aware the pt has been cleared. I did inform ok to hold Eliquis 1 day; 2 days if needed. I asked the pt son if the pt took her Eliquis today; He states I thinks she probably did as she takes all of her medications in the morning. I assured the pt's son that notes were sent to [name] for clearance and Eliquis recommendations.   We did only receive this clearance today.

## 2021-12-05 NOTE — Telephone Encounter (Signed)
Colletta Maryland with [facility] is following up due to clearance recommendation not being received. Colletta Maryland confirmed the fax number we have for their office is incorrect. The correct fax # is [phone]. Please resend ASAP as procedure is scheduled for tomorrow.

## 2021-12-06 DIAGNOSIS — E039 Hypothyroidism, unspecified: Secondary | ICD-10-CM | POA: Diagnosis not present

## 2021-12-06 DIAGNOSIS — E1022 Type 1 diabetes mellitus with diabetic chronic kidney disease: Secondary | ICD-10-CM | POA: Diagnosis not present

## 2021-12-06 DIAGNOSIS — E10649 Type 1 diabetes mellitus with hypoglycemia without coma: Secondary | ICD-10-CM | POA: Diagnosis not present

## 2021-12-06 DIAGNOSIS — H4089 Other specified glaucoma: Secondary | ICD-10-CM | POA: Diagnosis not present

## 2021-12-06 DIAGNOSIS — H02413 Mechanical ptosis of bilateral eyelids: Secondary | ICD-10-CM | POA: Diagnosis not present

## 2021-12-06 DIAGNOSIS — E1040 Type 1 diabetes mellitus with diabetic neuropathy, unspecified: Secondary | ICD-10-CM | POA: Diagnosis not present

## 2021-12-06 DIAGNOSIS — E1010 Type 1 diabetes mellitus with ketoacidosis without coma: Secondary | ICD-10-CM | POA: Diagnosis not present

## 2021-12-06 DIAGNOSIS — I110 Hypertensive heart disease with heart failure: Secondary | ICD-10-CM | POA: Diagnosis not present

## 2021-12-06 DIAGNOSIS — N183 Chronic kidney disease, stage 3 unspecified: Secondary | ICD-10-CM | POA: Diagnosis not present

## 2021-12-06 DIAGNOSIS — H4042X3 Glaucoma secondary to eye inflammation, left eye, severe stage: Secondary | ICD-10-CM | POA: Diagnosis not present

## 2021-12-06 DIAGNOSIS — Z79890 Hormone replacement therapy (postmenopausal): Secondary | ICD-10-CM | POA: Diagnosis not present

## 2021-12-06 DIAGNOSIS — Z888 Allergy status to other drugs, medicaments and biological substances status: Secondary | ICD-10-CM | POA: Diagnosis not present

## 2021-12-06 DIAGNOSIS — H04219 Epiphora due to excess lacrimation, unspecified lacrimal gland: Secondary | ICD-10-CM | POA: Diagnosis not present

## 2021-12-06 DIAGNOSIS — T8529XA Other mechanical complication of intraocular lens, initial encounter: Secondary | ICD-10-CM | POA: Diagnosis not present

## 2021-12-06 DIAGNOSIS — T83728A Exposure of other implanted mesh and other prosthetic materials to surrounding organ or tissue, initial encounter: Secondary | ICD-10-CM | POA: Diagnosis not present

## 2021-12-06 DIAGNOSIS — H02889 Meibomian gland dysfunction of unspecified eye, unspecified eyelid: Secondary | ICD-10-CM | POA: Diagnosis not present

## 2021-12-06 DIAGNOSIS — H209 Unspecified iridocyclitis: Secondary | ICD-10-CM | POA: Diagnosis not present

## 2021-12-06 DIAGNOSIS — E785 Hyperlipidemia, unspecified: Secondary | ICD-10-CM | POA: Diagnosis not present

## 2021-12-06 DIAGNOSIS — Z794 Long term (current) use of insulin: Secondary | ICD-10-CM | POA: Diagnosis not present

## 2021-12-06 DIAGNOSIS — I272 Pulmonary hypertension, unspecified: Secondary | ICD-10-CM | POA: Diagnosis not present

## 2021-12-06 DIAGNOSIS — K219 Gastro-esophageal reflux disease without esophagitis: Secondary | ICD-10-CM | POA: Diagnosis not present

## 2021-12-06 DIAGNOSIS — Z79899 Other long term (current) drug therapy: Secondary | ICD-10-CM | POA: Diagnosis not present

## 2021-12-06 DIAGNOSIS — E1039 Type 1 diabetes mellitus with other diabetic ophthalmic complication: Secondary | ICD-10-CM | POA: Diagnosis not present

## 2021-12-06 DIAGNOSIS — Z882 Allergy status to sulfonamides status: Secondary | ICD-10-CM | POA: Diagnosis not present

## 2021-12-06 DIAGNOSIS — H42 Glaucoma in diseases classified elsewhere: Secondary | ICD-10-CM | POA: Diagnosis not present

## 2021-12-06 DIAGNOSIS — H04123 Dry eye syndrome of bilateral lacrimal glands: Secondary | ICD-10-CM | POA: Diagnosis not present

## 2021-12-06 DIAGNOSIS — Z7901 Long term (current) use of anticoagulants: Secondary | ICD-10-CM | POA: Diagnosis not present

## 2021-12-06 DIAGNOSIS — I129 Hypertensive chronic kidney disease with stage 1 through stage 4 chronic kidney disease, or unspecified chronic kidney disease: Secondary | ICD-10-CM | POA: Diagnosis not present

## 2021-12-06 DIAGNOSIS — T85398A Other mechanical complication of other ocular prosthetic devices, implants and grafts, initial encounter: Secondary | ICD-10-CM | POA: Diagnosis not present

## 2021-12-06 DIAGNOSIS — I509 Heart failure, unspecified: Secondary | ICD-10-CM | POA: Diagnosis not present

## 2021-12-16 DIAGNOSIS — H4043X3 Glaucoma secondary to eye inflammation, bilateral, severe stage: Secondary | ICD-10-CM | POA: Diagnosis not present

## 2021-12-16 DIAGNOSIS — Z9889 Other specified postprocedural states: Secondary | ICD-10-CM | POA: Diagnosis not present

## 2021-12-17 DIAGNOSIS — R410 Disorientation, unspecified: Secondary | ICD-10-CM | POA: Diagnosis not present

## 2021-12-17 DIAGNOSIS — R1032 Left lower quadrant pain: Secondary | ICD-10-CM | POA: Diagnosis not present

## 2021-12-17 DIAGNOSIS — R000 Tachycardia, unspecified: Secondary | ICD-10-CM | POA: Diagnosis not present

## 2021-12-17 DIAGNOSIS — E785 Hyperlipidemia, unspecified: Secondary | ICD-10-CM | POA: Diagnosis not present

## 2021-12-17 DIAGNOSIS — Z79899 Other long term (current) drug therapy: Secondary | ICD-10-CM | POA: Diagnosis not present

## 2021-12-17 DIAGNOSIS — E1165 Type 2 diabetes mellitus with hyperglycemia: Secondary | ICD-10-CM | POA: Diagnosis not present

## 2021-12-17 DIAGNOSIS — E039 Hypothyroidism, unspecified: Secondary | ICD-10-CM | POA: Diagnosis not present

## 2021-12-17 DIAGNOSIS — I4891 Unspecified atrial fibrillation: Secondary | ICD-10-CM | POA: Diagnosis not present

## 2021-12-17 DIAGNOSIS — Z888 Allergy status to other drugs, medicaments and biological substances status: Secondary | ICD-10-CM | POA: Diagnosis not present

## 2021-12-17 DIAGNOSIS — E1022 Type 1 diabetes mellitus with diabetic chronic kidney disease: Secondary | ICD-10-CM | POA: Diagnosis not present

## 2021-12-17 DIAGNOSIS — I10 Essential (primary) hypertension: Secondary | ICD-10-CM | POA: Diagnosis not present

## 2021-12-17 DIAGNOSIS — H42 Glaucoma in diseases classified elsewhere: Secondary | ICD-10-CM | POA: Diagnosis not present

## 2021-12-17 DIAGNOSIS — E871 Hypo-osmolality and hyponatremia: Secondary | ICD-10-CM | POA: Diagnosis not present

## 2021-12-17 DIAGNOSIS — K5900 Constipation, unspecified: Secondary | ICD-10-CM | POA: Diagnosis not present

## 2021-12-17 DIAGNOSIS — I129 Hypertensive chronic kidney disease with stage 1 through stage 4 chronic kidney disease, or unspecified chronic kidney disease: Secondary | ICD-10-CM | POA: Diagnosis not present

## 2021-12-17 DIAGNOSIS — F17210 Nicotine dependence, cigarettes, uncomplicated: Secondary | ICD-10-CM | POA: Diagnosis not present

## 2021-12-17 DIAGNOSIS — N183 Chronic kidney disease, stage 3 unspecified: Secondary | ICD-10-CM | POA: Diagnosis not present

## 2021-12-17 DIAGNOSIS — Z720 Tobacco use: Secondary | ICD-10-CM | POA: Diagnosis not present

## 2021-12-17 DIAGNOSIS — Z716 Tobacco abuse counseling: Secondary | ICD-10-CM | POA: Diagnosis not present

## 2021-12-17 DIAGNOSIS — R0789 Other chest pain: Secondary | ICD-10-CM | POA: Diagnosis not present

## 2021-12-17 DIAGNOSIS — E1065 Type 1 diabetes mellitus with hyperglycemia: Secondary | ICD-10-CM | POA: Diagnosis not present

## 2021-12-17 DIAGNOSIS — R4182 Altered mental status, unspecified: Secondary | ICD-10-CM | POA: Diagnosis not present

## 2021-12-17 DIAGNOSIS — H4089 Other specified glaucoma: Secondary | ICD-10-CM | POA: Diagnosis not present

## 2021-12-17 DIAGNOSIS — Z882 Allergy status to sulfonamides status: Secondary | ICD-10-CM | POA: Diagnosis not present

## 2021-12-17 DIAGNOSIS — Z7901 Long term (current) use of anticoagulants: Secondary | ICD-10-CM | POA: Diagnosis not present

## 2021-12-17 DIAGNOSIS — E1039 Type 1 diabetes mellitus with other diabetic ophthalmic complication: Secondary | ICD-10-CM | POA: Diagnosis not present

## 2021-12-17 DIAGNOSIS — E872 Acidosis, unspecified: Secondary | ICD-10-CM | POA: Diagnosis not present

## 2021-12-17 DIAGNOSIS — R072 Precordial pain: Secondary | ICD-10-CM | POA: Diagnosis not present

## 2021-12-17 DIAGNOSIS — R079 Chest pain, unspecified: Secondary | ICD-10-CM | POA: Diagnosis not present

## 2021-12-17 DIAGNOSIS — Z794 Long term (current) use of insulin: Secondary | ICD-10-CM | POA: Diagnosis not present

## 2021-12-17 DIAGNOSIS — I480 Paroxysmal atrial fibrillation: Secondary | ICD-10-CM | POA: Diagnosis not present

## 2021-12-17 DIAGNOSIS — K760 Fatty (change of) liver, not elsewhere classified: Secondary | ICD-10-CM | POA: Diagnosis not present

## 2021-12-18 DIAGNOSIS — N183 Chronic kidney disease, stage 3 unspecified: Secondary | ICD-10-CM | POA: Diagnosis not present

## 2021-12-18 DIAGNOSIS — Z720 Tobacco use: Secondary | ICD-10-CM | POA: Diagnosis not present

## 2021-12-18 DIAGNOSIS — I480 Paroxysmal atrial fibrillation: Secondary | ICD-10-CM | POA: Diagnosis not present

## 2021-12-18 DIAGNOSIS — E871 Hypo-osmolality and hyponatremia: Secondary | ICD-10-CM | POA: Diagnosis not present

## 2021-12-18 DIAGNOSIS — I10 Essential (primary) hypertension: Secondary | ICD-10-CM | POA: Diagnosis not present

## 2021-12-18 DIAGNOSIS — E872 Acidosis, unspecified: Secondary | ICD-10-CM | POA: Diagnosis not present

## 2021-12-18 DIAGNOSIS — E1022 Type 1 diabetes mellitus with diabetic chronic kidney disease: Secondary | ICD-10-CM | POA: Diagnosis not present

## 2021-12-22 DIAGNOSIS — H401120 Primary open-angle glaucoma, left eye, stage unspecified: Secondary | ICD-10-CM | POA: Diagnosis not present

## 2021-12-22 DIAGNOSIS — Z961 Presence of intraocular lens: Secondary | ICD-10-CM | POA: Diagnosis not present

## 2021-12-22 DIAGNOSIS — H353221 Exudative age-related macular degeneration, left eye, with active choroidal neovascularization: Secondary | ICD-10-CM | POA: Diagnosis not present

## 2021-12-22 DIAGNOSIS — Z79899 Other long term (current) drug therapy: Secondary | ICD-10-CM | POA: Diagnosis not present

## 2021-12-22 DIAGNOSIS — H4041X1 Glaucoma secondary to eye inflammation, right eye, mild stage: Secondary | ICD-10-CM | POA: Diagnosis not present

## 2021-12-22 DIAGNOSIS — H44111 Panuveitis, right eye: Secondary | ICD-10-CM | POA: Diagnosis not present

## 2021-12-22 DIAGNOSIS — H209 Unspecified iridocyclitis: Secondary | ICD-10-CM | POA: Diagnosis not present

## 2021-12-22 DIAGNOSIS — H353212 Exudative age-related macular degeneration, right eye, with inactive choroidal neovascularization: Secondary | ICD-10-CM | POA: Diagnosis not present

## 2021-12-22 DIAGNOSIS — H3581 Retinal edema: Secondary | ICD-10-CM | POA: Diagnosis not present

## 2021-12-22 DIAGNOSIS — H30031 Focal chorioretinal inflammation, peripheral, right eye: Secondary | ICD-10-CM | POA: Diagnosis not present

## 2021-12-22 DIAGNOSIS — H35371 Puckering of macula, right eye: Secondary | ICD-10-CM | POA: Diagnosis not present

## 2022-01-05 DIAGNOSIS — Z1152 Encounter for screening for COVID-19: Secondary | ICD-10-CM | POA: Diagnosis not present

## 2022-01-05 DIAGNOSIS — Z882 Allergy status to sulfonamides status: Secondary | ICD-10-CM | POA: Diagnosis not present

## 2022-01-05 DIAGNOSIS — R1032 Left lower quadrant pain: Secondary | ICD-10-CM | POA: Diagnosis not present

## 2022-01-05 DIAGNOSIS — N289 Disorder of kidney and ureter, unspecified: Secondary | ICD-10-CM | POA: Diagnosis not present

## 2022-01-05 DIAGNOSIS — I517 Cardiomegaly: Secondary | ICD-10-CM | POA: Diagnosis not present

## 2022-01-05 DIAGNOSIS — I110 Hypertensive heart disease with heart failure: Secondary | ICD-10-CM | POA: Diagnosis not present

## 2022-01-05 DIAGNOSIS — R110 Nausea: Secondary | ICD-10-CM | POA: Diagnosis not present

## 2022-01-05 DIAGNOSIS — E109 Type 1 diabetes mellitus without complications: Secondary | ICD-10-CM | POA: Diagnosis not present

## 2022-01-05 DIAGNOSIS — R0789 Other chest pain: Secondary | ICD-10-CM | POA: Diagnosis not present

## 2022-01-05 DIAGNOSIS — N3289 Other specified disorders of bladder: Secondary | ICD-10-CM | POA: Diagnosis not present

## 2022-01-05 DIAGNOSIS — Z87891 Personal history of nicotine dependence: Secondary | ICD-10-CM | POA: Diagnosis not present

## 2022-01-05 DIAGNOSIS — E785 Hyperlipidemia, unspecified: Secondary | ICD-10-CM | POA: Diagnosis not present

## 2022-01-05 DIAGNOSIS — I5023 Acute on chronic systolic (congestive) heart failure: Secondary | ICD-10-CM | POA: Diagnosis not present

## 2022-01-05 DIAGNOSIS — I4891 Unspecified atrial fibrillation: Secondary | ICD-10-CM | POA: Diagnosis not present

## 2022-01-05 DIAGNOSIS — Z888 Allergy status to other drugs, medicaments and biological substances status: Secondary | ICD-10-CM | POA: Diagnosis not present

## 2022-01-05 DIAGNOSIS — I7123 Aneurysm of the descending thoracic aorta, without rupture: Secondary | ICD-10-CM | POA: Diagnosis not present

## 2022-01-05 DIAGNOSIS — Z8673 Personal history of transient ischemic attack (TIA), and cerebral infarction without residual deficits: Secondary | ICD-10-CM | POA: Diagnosis not present

## 2022-01-05 DIAGNOSIS — R079 Chest pain, unspecified: Secondary | ICD-10-CM | POA: Diagnosis not present

## 2022-01-05 DIAGNOSIS — K5730 Diverticulosis of large intestine without perforation or abscess without bleeding: Secondary | ICD-10-CM | POA: Diagnosis not present

## 2022-01-09 ENCOUNTER — Telehealth: Payer: Self-pay | Admitting: General Practice

## 2022-01-09 NOTE — Telephone Encounter (Signed)
Transition Care Management Unsuccessful Follow-up Telephone Call  Date of discharge and from where:  01/05/22 from [facility]  Attempts:  1st Attempt  Reason for unsuccessful TCM follow-up call:  Left voice message

## 2022-01-11 NOTE — Telephone Encounter (Signed)
Transition Care Management Unsuccessful Follow-up Telephone Call  Date of discharge and from where:  01/05/22 from [facility]  Attempts:  2nd Attempt  Reason for unsuccessful TCM follow-up call:  Left voice message

## 2022-01-16 NOTE — Telephone Encounter (Signed)
Transition Care Management Unsuccessful Follow-up Telephone Call  Date of discharge and from where:  01/05/22 from [facility]  Attempts:  3rd Attempt  Reason for unsuccessful TCM follow-up call:  Left voice message

## 2022-01-17 ENCOUNTER — Encounter: Payer: Self-pay | Admitting: Family Medicine

## 2022-01-17 ENCOUNTER — Ambulatory Visit (INDEPENDENT_AMBULATORY_CARE_PROVIDER_SITE_OTHER): Payer: Medicare Other | Admitting: Family Medicine

## 2022-01-17 VITALS — BP 149/80 | HR 55 | Ht 65.0 in | Wt 132.0 lb

## 2022-01-17 DIAGNOSIS — R197 Diarrhea, unspecified: Secondary | ICD-10-CM

## 2022-01-17 NOTE — Patient Instructions (Signed)
We'll be in touch with lab results.  Return stool sample to be sent off.  Please contact me if having worsening symptoms.

## 2022-01-18 LAB — CBC WITH DIFFERENTIAL/PLATELET
Absolute Monocytes: 602 {cells}/uL (ref 200–950)
Basophils Absolute: 53 {cells}/uL (ref 0–200)
Basophils Relative: 0.9 %
Eosinophils Absolute: 212 {cells}/uL (ref 15–500)
Eosinophils Relative: 3.6 %
HCT: 42.4 % (ref 35.0–45.0)
Hemoglobin: 14.3 g/dL (ref 11.7–15.5)
Lymphs Abs: 1894 {cells}/uL (ref 850–3900)
MCH: 31.6 pg (ref 27.0–33.0)
MCHC: 33.7 g/dL (ref 32.0–36.0)
MCV: 93.8 fL (ref 80.0–100.0)
MPV: 10.4 fL (ref 7.5–12.5)
Monocytes Relative: 10.2 %
Neutro Abs: 3139 {cells}/uL (ref 1500–7800)
Neutrophils Relative %: 53.2 %
Platelets: 291 Thousand/uL (ref 140–400)
RBC: 4.52 Million/uL (ref 3.80–5.10)
RDW: 12.7 % (ref 11.0–15.0)
Total Lymphocyte: 32.1 %
WBC: 5.9 Thousand/uL (ref 3.8–10.8)

## 2022-01-18 LAB — COMPLETE METABOLIC PANEL WITHOUT GFR
AG Ratio: 1.7 (calc) (ref 1.0–2.5)
ALT: 12 U/L (ref 6–29)
AST: 16 U/L (ref 10–35)
Albumin: 4.3 g/dL (ref 3.6–5.1)
Alkaline phosphatase (APISO): 68 U/L (ref 37–153)
BUN/Creatinine Ratio: 26 (calc) — ABNORMAL HIGH (ref 6–22)
BUN: 26 mg/dL — ABNORMAL HIGH (ref 7–25)
CO2: 29 mmol/L (ref 20–32)
Calcium: 9.6 mg/dL (ref 8.6–10.4)
Chloride: 102 mmol/L (ref 98–110)
Creat: 0.99 mg/dL — ABNORMAL HIGH (ref 0.60–0.95)
Globulin: 2.6 g/dL (ref 1.9–3.7)
Glucose, Bld: 155 mg/dL — ABNORMAL HIGH (ref 65–99)
Potassium: 4.2 mmol/L (ref 3.5–5.3)
Sodium: 141 mmol/L (ref 135–146)
Total Bilirubin: 0.5 mg/dL (ref 0.2–1.2)
Total Protein: 6.9 g/dL (ref 6.1–8.1)
eGFR: 57 mL/min/1.73m2 — ABNORMAL LOW

## 2022-01-19 DIAGNOSIS — R197 Diarrhea, unspecified: Secondary | ICD-10-CM | POA: Diagnosis not present

## 2022-01-22 DIAGNOSIS — R197 Diarrhea, unspecified: Secondary | ICD-10-CM | POA: Insufficient documentation

## 2022-01-22 NOTE — Assessment & Plan Note (Signed)
She continues to have diarrhea.  Previously had other symptoms consistent with viral etiology.  CT scan of the abdomen unremarkable in the ED.  She denies any further chest pain.  We discussed trying to have her increase her fluid intake.  Checking stool culture, C. difficile, CBC and CMP.

## 2022-01-22 NOTE — Progress Notes (Signed)
Carmen Cooper - 81 y.o. female MRN [id number]  Date of birth: 1940/11/06  Subjective Chief Complaint  Patient presents with   Hospitalization Follow-up    HPI Carmen Cooper is a 81 y.o. female here today for follow up of recent ED visit.  Recently seen in the ED with complaint of chest pain with associated shortness of breath as well as generalized bodyaches, headaches nausea and diarrhea.   EKG without new changes and negative troponins.  She had normal chest x-ray.  CT scan of the abdomen and pelvis did show aneurysmal dilation of the thoracic aorta at 3.7 cm, however no acute process was noted.  Overall she is feeling better but has continued to have diarrhea as well as some fatigue.  She has not noted any blood in her stool.  Her appetite has been pretty good.  She is not drinking a whole lot of fluids.  She did try Imodium today which does seem to help.  ROS:  A comprehensive ROS was completed and negative except as noted per HPI  Past Medical History:  Diagnosis Date   Atrial fibrillation ([place])    BCC (basal cell carcinoma of skin)    Diabetes ([place])    Glaucoma    History of TIA (transient ischemic attack) 08/11/2013   12/2012 - [name]    Hypertension    Hypothyroidism 08/11/2013   Memory changes    Microscopic colitis 08/21/2013   2008 - [facility] [name].  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, [facility]     Past Surgical History:  Procedure Laterality Date   BREAST EXCISIONAL BIOPSY Left    BREAST EXCISIONAL BIOPSY Left    CARDIOVERSION N/A 02/12/2019   Procedure: CARDIOVERSION;  Surgeon: [name], MD;  Location: [facility];  Service: Cardiovascular;  Laterality: N/A;   MOHS SURGERY  2019   Nose bcc    OTHER SURGICAL HISTORY  04/01/2019   biopsy on nose and lip     Social History   Socioeconomic History   Marital status: Widowed    Spouse name: Carmen Cooper   Number  of children: 1   Years of education: 12   Highest education level: 12th grade  Occupational History   Occupation: Retired    Comment: retired  Tobacco Use   Smoking status: Former    Packs/day: 0.25    Years: 20.00    Total pack years: 5.00    Types: Cigarettes   Smokeless tobacco: Never  Vaping Use   Vaping Use: Never used  Substance and Sexual Activity   Alcohol use: Not Currently   Drug use: No   Sexual activity: Not Currently    Partners: Male  Other Topics Concern   Not on file  Social History Narrative   Lives alone. She has one son, who takes her to her doctor's appointments. She enjoys crochet and gardening.   Social Determinants of Health   Financial Resource Strain: Low Risk  (03/11/2021)   Overall Financial Resource Strain (CARDIA)    Difficulty of Paying Living Expenses: Not hard at all  Food Insecurity: No Food Insecurity (03/11/2021)   Hunger Vital Sign    Worried About Running Out of Food in the Last Year: Never true    Ran Out of Food in the Last Year: Never true  Transportation Needs: No Transportation Needs (03/11/2021)   PRAPARE - Transportation  Lack of Transportation (Medical): No    Lack of Transportation (Non-Medical): No  Physical Activity: Sufficiently Active (03/11/2021)   Exercise Vital Sign    Days of Exercise per Week: 7 days    Minutes of Exercise per Session: 40 min  Stress: No Stress Concern Present (03/11/2021)   [place]    Feeling of Stress : Not at all  Social Connections: Socially Isolated (03/11/2021)   Social Connection and Isolation Panel [NHANES]    Frequency of Communication with Friends and Family: Twice a week    Frequency of Social Gatherings with Friends and Family: Never    Attends Religious Services: Never    Marine scientist or Organizations: No    Attends Archivist Meetings: Never    Marital Status: Widowed    Family History   Problem Relation Age of Onset   Heart disease Son    Hypertension Mother    Cancer Mother        unsure of origin   Heart attack Father    Diabetes Neg Hx     Health Maintenance  Topic Date Due   Medicare Annual Wellness (AWV)  03/11/2022   Diabetic kidney evaluation - Urine ACR  03/16/2022 (Originally 02/11/2015)   OPHTHALMOLOGY EXAM  03/16/2022 (Originally 07/21/2021)   COVID-19 Vaccine (7 - 2023-24 season) 03/16/2022 (Originally 01/10/2022)   Zoster Vaccines- Shingrix (2 of 2) 04/18/2022 (Originally 05/21/2021)   INFLUENZA VACCINE  05/14/2022 (Originally 09/13/2021)   HEMOGLOBIN A1C  02/15/2022   FOOT EXAM  03/09/2022   Diabetic kidney evaluation - eGFR measurement  01/18/2023   DTaP/Tdap/Td (3 - Td or Tdap) 06/26/2031   Pneumonia Vaccine 99+ Years old  Completed   DEXA SCAN  Completed   HPV VACCINES  Aged Out     ----------------------------------------------------------------------------------------------------------------------------------------------------------------------------------------------------------------- Physical Exam BP (!) 149/80 (BP Location: Left Arm, Patient Position: Sitting, Cuff Size: Normal)   Pulse (!) 55   Ht _0  (1.651 m)   Wt 132 lb (59.9 kg)   SpO2 99%   BMI 21.97 kg/m   Physical Exam Constitutional:      Appearance: Normal appearance.  HENT:     Head: Normocephalic and atraumatic.  Eyes:     General: No scleral icterus. Cardiovascular:     Rate and Rhythm: Normal rate and regular rhythm.  Pulmonary:     Effort: Pulmonary effort is normal.     Breath sounds: Normal breath sounds.  Musculoskeletal:     Cervical back: Neck supple.  Neurological:     Mental Status: She is alert.  Psychiatric:        Mood and Affect: Mood normal.        Behavior: Behavior normal.      ------------------------------------------------------------------------------------------------------------------------------------------------------------------------------------------------------------------- Assessment and Plan  Diarrhea She continues to have diarrhea.  Previously had other symptoms consistent with viral etiology.  CT scan of the abdomen unremarkable in the ED.  She denies any further chest pain.  We discussed trying to have her increase her fluid intake.  Checking stool culture, C. difficile, CBC and CMP.   No orders of the defined types were placed in this encounter.   No follow-ups on file.    This visit occurred during the SARS-CoV-2 public health emergency.  Safety protocols were in place, including screening questions prior to the visit, additional usage of staff PPE, and extensive cleaning of exam room while observing appropriate contact time as indicated for disinfecting solutions.

## 2022-01-24 LAB — TEST AUTHORIZATION: TEST CODE:: 32114

## 2022-01-24 LAB — C. DIFFICILE GDH AND TOXIN A/B
GDH ANTIGEN: NOT DETECTED
MICRO NUMBER:: 14289461
SPECIMEN QUALITY:: ADEQUATE
TOXIN A AND B: NOT DETECTED

## 2022-01-24 LAB — SALMONELLA/SHIGELLA CULT, CAMPY EIA AND SHIGA TOXIN RFL ECOLI
MICRO NUMBER: 14286635
MICRO NUMBER:: 14286636
MICRO NUMBER:: 14286637
Result:: NOT DETECTED
SHIGA RESULT:: NOT DETECTED
SPECIMEN QUALITY: ADEQUATE
SPECIMEN QUALITY:: ADEQUATE
SPECIMEN QUALITY:: ADEQUATE

## 2022-01-30 ENCOUNTER — Ambulatory Visit: Payer: Medicare Other | Admitting: Cardiology

## 2022-01-30 DIAGNOSIS — E039 Hypothyroidism, unspecified: Secondary | ICD-10-CM | POA: Diagnosis not present

## 2022-01-30 DIAGNOSIS — E1021 Type 1 diabetes mellitus with diabetic nephropathy: Secondary | ICD-10-CM | POA: Diagnosis not present

## 2022-01-30 DIAGNOSIS — E1049 Type 1 diabetes mellitus with other diabetic neurological complication: Secondary | ICD-10-CM | POA: Diagnosis not present

## 2022-01-30 DIAGNOSIS — E1022 Type 1 diabetes mellitus with diabetic chronic kidney disease: Secondary | ICD-10-CM | POA: Diagnosis not present

## 2022-01-30 DIAGNOSIS — N183 Chronic kidney disease, stage 3 unspecified: Secondary | ICD-10-CM | POA: Diagnosis not present

## 2022-02-15 ENCOUNTER — Ambulatory Visit (INDEPENDENT_AMBULATORY_CARE_PROVIDER_SITE_OTHER): Payer: Medicare Other | Admitting: Family Medicine

## 2022-02-15 ENCOUNTER — Encounter: Payer: Self-pay | Admitting: Family Medicine

## 2022-02-15 VITALS — BP 157/78 | HR 58 | Ht 65.0 in | Wt 131.0 lb

## 2022-02-15 DIAGNOSIS — R197 Diarrhea, unspecified: Secondary | ICD-10-CM | POA: Diagnosis not present

## 2022-02-15 DIAGNOSIS — I152 Hypertension secondary to endocrine disorders: Secondary | ICD-10-CM

## 2022-02-15 DIAGNOSIS — E1039 Type 1 diabetes mellitus with other diabetic ophthalmic complication: Secondary | ICD-10-CM | POA: Diagnosis not present

## 2022-02-15 DIAGNOSIS — E1159 Type 2 diabetes mellitus with other circulatory complications: Secondary | ICD-10-CM | POA: Diagnosis not present

## 2022-02-15 NOTE — Assessment & Plan Note (Signed)
Blood pressure is not well-controlled in clinic today.  I asked her to monitor her blood pressures at home over the next couple weeks and send readings via MyChart.  If these are elevated we will plan to increase her lisinopril to 40 mg daily.

## 2022-02-15 NOTE — Assessment & Plan Note (Signed)
This is resolved at this time.

## 2022-02-15 NOTE — Progress Notes (Signed)
Carmen Cooper - 82 y.o. female MRN [id number]  Date of birth: [birth year]  Subjective Chief Complaint  Patient presents with   GI Problem    HPI Carmen Cooper is a 82 year old female here today for follow-up visit.  Last seen for diarrhea which she reports has resolved at this point.  Workup for this was unremarkable.  Blood pressure is elevated.  Continues on lisinopril and diltiazem.  Denies side effects of medication.  Has not had chest pain, shortness of breath, palpitations, headaches or vision changes.  Continues to see endocrinology for management of her diabetes.  Stable at this time with current insulin dosing.  Additionally, endocrinology is managing her hypothyroidism.  ROS:  A comprehensive ROS was completed and negative except as noted per HPI  Allergies  Allergen Reactions   Dexamethasone Anaphylaxis and Other (See Comments)    Blood sugar elevated     Brimonidine Tartrate Other (See Comments)    Burning and redness    Clindamycin/Lincomycin Rash   Sulfa Antibiotics Rash   Valacyclovir Hcl Rash    Past Medical History:  Diagnosis Date   Atrial fibrillation ([name])    BCC (basal cell carcinoma of skin)    Diabetes ([name])    Glaucoma    History of TIA (transient ischemic attack) 08/11/2013   12/2012 - [name]    Hypertension    Hypothyroidism 08/11/2013   Memory changes    Microscopic colitis 08/21/2013   2008 - [facility] [name].  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, [facility]     Past Surgical History:  Procedure Laterality Date   BREAST EXCISIONAL BIOPSY Left    BREAST EXCISIONAL BIOPSY Left    CARDIOVERSION N/A 02/12/2019   Procedure: CARDIOVERSION;  Surgeon: [name], MD;  Location: [facility];  Service: Cardiovascular;  Laterality: N/A;   MOHS SURGERY  2019   Nose bcc    OTHER SURGICAL HISTORY  04/01/2019   biopsy on nose and lip      Social History   Socioeconomic History   Marital status: Widowed    Spouse name: [name]   Number of children: 1   Years of education: 12   Highest education level: 12th grade  Occupational History   Occupation: Retired    Comment: retired  Tobacco Use   Smoking status: Former    Packs/day: 0.25    Years: 20.00    Total pack years: 5.00    Types: Cigarettes   Smokeless tobacco: Never  Vaping Use   Vaping Use: Never used  Substance and Sexual Activity   Alcohol use: Not Currently   Drug use: No   Sexual activity: Not Currently    Partners: Male  Other Topics Concern   Not on file  Social History Narrative   Lives alone. She has one son, who takes her to her doctor's appointments. She enjoys crochet and gardening.   Social Determinants of Health   Financial Resource Strain: Low Risk  (03/11/2021)   Overall Financial Resource Strain (CARDIA)    Difficulty of Paying Living Expenses: Not hard at all  Food Insecurity: No Food Insecurity (03/11/2021)   Hunger Vital Sign    Worried About Running Out of Food in the Last Year: Never true    Ran Out of Food in the Last Year: Never true  Transportation Needs: No Transportation Needs (03/11/2021)   PRAPARE - Transportation  Lack of Transportation (Medical): No    Lack of Transportation (Non-Medical): No  Physical Activity: Sufficiently Active (03/11/2021)   Exercise Vital Sign    Days of Exercise per Week: 7 days    Minutes of Exercise per Session: 40 min  Stress: No Stress Concern Present (03/11/2021)   [place]    Feeling of Stress : Not at all  Social Connections: Socially Isolated (03/11/2021)   Social Connection and Isolation Panel [NHANES]    Frequency of Communication with Friends and Family: Twice a week    Frequency of Social Gatherings with Friends and Family: Never    Attends Religious Services: Never    Marine scientist or Organizations: No     Attends Archivist Meetings: Never    Marital Status: Widowed    Family History  Problem Relation Age of Onset   Heart disease Son    Hypertension Mother    Cancer Mother        unsure of origin   Heart attack Father    Diabetes Neg Hx     Health Maintenance  Topic Date Due   HEMOGLOBIN A1C  02/15/2022   Medicare Annual Wellness (AWV)  03/11/2022   Diabetic kidney evaluation - Urine ACR  03/16/2022 (Originally 02/11/2015)   OPHTHALMOLOGY EXAM  03/16/2022 (Originally 07/21/2021)   COVID-19 Vaccine (7 - 2023-24 season) 03/16/2022 (Originally 01/10/2022)   Zoster Vaccines- Shingrix (2 of 2) 04/18/2022 (Originally 05/21/2021)   INFLUENZA VACCINE  05/14/2022 (Originally 09/13/2021)   FOOT EXAM  03/09/2022   Diabetic kidney evaluation - eGFR measurement  01/18/2023   DTaP/Tdap/Td (3 - Td or Tdap) 06/26/2031   Pneumonia Vaccine 54+ Years old  Completed   DEXA SCAN  Completed   HPV VACCINES  Aged Out     ----------------------------------------------------------------------------------------------------------------------------------------------------------------------------------------------------------------- Physical Exam BP (!) 157/78   Pulse (!) 58   Ht _0  (1.651 m)   Wt 131 lb (59.4 kg)   SpO2 100%   BMI 21.80 kg/m   Physical Exam Constitutional:      Appearance: Normal appearance.  HENT:     Head: Normocephalic and atraumatic.  Eyes:     General: No scleral icterus. Cardiovascular:     Rate and Rhythm: Normal rate and regular rhythm.  Pulmonary:     Effort: Pulmonary effort is normal.     Breath sounds: Normal breath sounds.  Neurological:     Mental Status: She is alert.  Psychiatric:        Mood and Affect: Mood normal.        Behavior: Behavior normal.      ------------------------------------------------------------------------------------------------------------------------------------------------------------------------------------------------------------------- Assessment and Plan  Hypertension associated with diabetes ([name]) Blood pressure is not well-controlled in clinic today.  I asked her to monitor her blood pressures at home over the next couple weeks and send readings via MyChart.  If these are elevated we will plan to increase her lisinopril to 40 mg daily.  Diarrhea This is resolved at this time.  Type 1 diabetes ([place]) Management per endocrinology.  Stable at this time.   No orders of the defined types were placed in this encounter.   Return in about 6 months (around 08/16/2022) for HTN.    This visit occurred during the SARS-CoV-2 public health emergency.  Safety protocols were in place, including screening questions prior to the visit, additional usage of staff PPE, and extensive cleaning of exam room while observing appropriate contact time as indicated for  disinfecting solutions.

## 2022-02-15 NOTE — Assessment & Plan Note (Signed)
Management per endocrinology.  Stable at this time. 

## 2022-02-17 ENCOUNTER — Telehealth: Payer: Self-pay | Admitting: *Deleted

## 2022-02-17 ENCOUNTER — Encounter: Payer: Self-pay | Admitting: Cardiology

## 2022-02-17 NOTE — Telephone Encounter (Signed)
Pt's husband sent a my chart message stating that the pt is needing to have another eye procedure due to complications from her last procedure. I left a message for Dr. Duayne Cal office to fax over STAT clearance request to [phone]. Per the pt's husband states surgery may be done this Tuesday 02/21/22.

## 2022-02-17 NOTE — Telephone Encounter (Signed)
Patient with diagnosis of atrial fibrillation on Eliquis for anticoagulation.    Procedure:   REVISION OF BLEPHAROPLASTY WITH AMT; LEFT EYE   Date of Surgery:  Clearance 02/21/22 URGENT       CHA2DS2-VASc Score = 8   This indicates a 10.8% annual risk of stroke. The patient's score is based upon: CHF History: 0 HTN History: 1 Diabetes History: 1 Stroke History: 2 Vascular Disease History: 1 Age Score: 2 Gender Score: 1    CrCl 41 Platelet count 291  Per office protocol, patient can hold Eliquis for 1 days prior to procedure as well as day of procedure.     Patient will not need bridging with Lovenox (enoxaparin) around procedure.  **This guidance is not considered finalized until pre-operative APP has relayed final recommendations.**

## 2022-02-17 NOTE — Telephone Encounter (Signed)
   Pre-operative Risk Assessment    Patient Name: Carmen Cooper  DOB: 03-03-1940 MRN: [id number]      Request for Surgical Clearance    Procedure:   REVISION OF BLEPHAROPLASTY WITH AMT; LEFT EYE  Date of Surgery:  Clearance 02/21/22 URGENT                               Surgeon:  DR. FRANK MOYA Surgeon's Group or Practice Name:  [facility] Phone number:  [phone]; [facility] [place] Fax number:  [phone]   Type of Clearance Requested:   - Medical  - Pharmacy:  Hold Apixaban (Eliquis) x DAY BEFORE THE PROCEDURE  AND DAY OF SURGERY   Type of Anesthesia:  Not Indicated   Additional requests/questions:    Jiles Prows   02/17/2022, 12:07 PM

## 2022-02-17 NOTE — Telephone Encounter (Signed)
I will forward to pre op for any final notes

## 2022-02-17 NOTE — Telephone Encounter (Signed)
   Name: Carmen Cooper  DOB: 1941-01-09  MRN: [id number]   Primary Cardiologist: Constance Haw, MD  Chart reviewed as part of pre-operative protocol coverage. Patient was contacted 02/17/2022 in reference to pre-operative risk assessment for pending surgery as outlined below.  Carmen Cooper was last seen on 03/14/2021 by Dr. Curt Bears.  Since that day, Carmen Cooper has done well with no new cardiac complaints or concerns she is able to achieve 4 metabolic equivalents of activity and has very minimal deficiencies in her physical activity..  Therefore, based on ACC/AHA guidelines, the patient would be at acceptable risk for the planned procedure without further cardiovascular testing.   The patient was advised that if she develops new symptoms prior to surgery to contact our office to arrange for a follow-up visit, and she verbalized understanding.  Per office protocol, patient can hold Eliquis for 1 days prior to procedure as well as day of procedure.     Patient will not need bridging with Lovenox (enoxaparin) around procedure.   I will route this recommendation to the requesting party via Epic fax function and remove from pre-op pool. Please call with questions.  Mable Fill, Marissa Nestle, NP 02/17/2022, 2:46 PM

## 2022-02-17 NOTE — Telephone Encounter (Signed)
Office calling didn't received fax. Please refax info.

## 2022-02-21 DIAGNOSIS — H209 Unspecified iridocyclitis: Secondary | ICD-10-CM | POA: Diagnosis not present

## 2022-02-21 DIAGNOSIS — E1039 Type 1 diabetes mellitus with other diabetic ophthalmic complication: Secondary | ICD-10-CM | POA: Diagnosis not present

## 2022-02-21 DIAGNOSIS — Z8673 Personal history of transient ischemic attack (TIA), and cerebral infarction without residual deficits: Secondary | ICD-10-CM | POA: Diagnosis not present

## 2022-02-21 DIAGNOSIS — Z79890 Hormone replacement therapy (postmenopausal): Secondary | ICD-10-CM | POA: Diagnosis not present

## 2022-02-21 DIAGNOSIS — T85398A Other mechanical complication of other ocular prosthetic devices, implants and grafts, initial encounter: Secondary | ICD-10-CM | POA: Diagnosis not present

## 2022-02-21 DIAGNOSIS — E1022 Type 1 diabetes mellitus with diabetic chronic kidney disease: Secondary | ICD-10-CM | POA: Diagnosis not present

## 2022-02-21 DIAGNOSIS — Z7901 Long term (current) use of anticoagulants: Secondary | ICD-10-CM | POA: Diagnosis not present

## 2022-02-21 DIAGNOSIS — I129 Hypertensive chronic kidney disease with stage 1 through stage 4 chronic kidney disease, or unspecified chronic kidney disease: Secondary | ICD-10-CM | POA: Diagnosis not present

## 2022-02-21 DIAGNOSIS — Z882 Allergy status to sulfonamides status: Secondary | ICD-10-CM | POA: Diagnosis not present

## 2022-02-21 DIAGNOSIS — Z794 Long term (current) use of insulin: Secondary | ICD-10-CM | POA: Diagnosis not present

## 2022-02-21 DIAGNOSIS — H42 Glaucoma in diseases classified elsewhere: Secondary | ICD-10-CM | POA: Diagnosis not present

## 2022-02-21 DIAGNOSIS — H4089 Other specified glaucoma: Secondary | ICD-10-CM | POA: Diagnosis not present

## 2022-02-21 DIAGNOSIS — E1040 Type 1 diabetes mellitus with diabetic neuropathy, unspecified: Secondary | ICD-10-CM | POA: Diagnosis not present

## 2022-02-21 DIAGNOSIS — H4042X3 Glaucoma secondary to eye inflammation, left eye, severe stage: Secondary | ICD-10-CM | POA: Diagnosis not present

## 2022-02-21 DIAGNOSIS — F17210 Nicotine dependence, cigarettes, uncomplicated: Secondary | ICD-10-CM | POA: Diagnosis not present

## 2022-02-21 DIAGNOSIS — Z79899 Other long term (current) drug therapy: Secondary | ICD-10-CM | POA: Diagnosis not present

## 2022-02-21 DIAGNOSIS — K219 Gastro-esophageal reflux disease without esophagitis: Secondary | ICD-10-CM | POA: Diagnosis not present

## 2022-02-21 DIAGNOSIS — N183 Chronic kidney disease, stage 3 unspecified: Secondary | ICD-10-CM | POA: Diagnosis not present

## 2022-02-21 DIAGNOSIS — E039 Hypothyroidism, unspecified: Secondary | ICD-10-CM | POA: Diagnosis not present

## 2022-02-21 DIAGNOSIS — Z888 Allergy status to other drugs, medicaments and biological substances status: Secondary | ICD-10-CM | POA: Diagnosis not present

## 2022-02-21 DIAGNOSIS — E1122 Type 2 diabetes mellitus with diabetic chronic kidney disease: Secondary | ICD-10-CM | POA: Diagnosis not present

## 2022-02-21 DIAGNOSIS — I4891 Unspecified atrial fibrillation: Secondary | ICD-10-CM | POA: Diagnosis not present

## 2022-02-22 DIAGNOSIS — H4043X3 Glaucoma secondary to eye inflammation, bilateral, severe stage: Secondary | ICD-10-CM | POA: Diagnosis not present

## 2022-02-22 DIAGNOSIS — Z9889 Other specified postprocedural states: Secondary | ICD-10-CM | POA: Diagnosis not present

## 2022-02-22 DIAGNOSIS — H209 Unspecified iridocyclitis: Secondary | ICD-10-CM | POA: Diagnosis not present

## 2022-02-24 DIAGNOSIS — I129 Hypertensive chronic kidney disease with stage 1 through stage 4 chronic kidney disease, or unspecified chronic kidney disease: Secondary | ICD-10-CM | POA: Diagnosis present

## 2022-02-24 DIAGNOSIS — Z8673 Personal history of transient ischemic attack (TIA), and cerebral infarction without residual deficits: Secondary | ICD-10-CM | POA: Diagnosis not present

## 2022-02-24 DIAGNOSIS — Z8249 Family history of ischemic heart disease and other diseases of the circulatory system: Secondary | ICD-10-CM | POA: Diagnosis not present

## 2022-02-24 DIAGNOSIS — R0789 Other chest pain: Secondary | ICD-10-CM | POA: Diagnosis not present

## 2022-02-24 DIAGNOSIS — Z794 Long term (current) use of insulin: Secondary | ICD-10-CM | POA: Diagnosis not present

## 2022-02-24 DIAGNOSIS — R079 Chest pain, unspecified: Secondary | ICD-10-CM | POA: Diagnosis not present

## 2022-02-24 DIAGNOSIS — E1022 Type 1 diabetes mellitus with diabetic chronic kidney disease: Secondary | ICD-10-CM | POA: Diagnosis present

## 2022-02-24 DIAGNOSIS — E1010 Type 1 diabetes mellitus with ketoacidosis without coma: Secondary | ICD-10-CM | POA: Diagnosis present

## 2022-02-24 DIAGNOSIS — I083 Combined rheumatic disorders of mitral, aortic and tricuspid valves: Secondary | ICD-10-CM | POA: Diagnosis not present

## 2022-02-24 DIAGNOSIS — N1832 Chronic kidney disease, stage 3b: Secondary | ICD-10-CM | POA: Diagnosis present

## 2022-02-24 DIAGNOSIS — Z9641 Presence of insulin pump (external) (internal): Secondary | ICD-10-CM | POA: Diagnosis present

## 2022-02-24 DIAGNOSIS — E785 Hyperlipidemia, unspecified: Secondary | ICD-10-CM | POA: Diagnosis present

## 2022-02-24 DIAGNOSIS — Z888 Allergy status to other drugs, medicaments and biological substances status: Secondary | ICD-10-CM | POA: Diagnosis not present

## 2022-02-24 DIAGNOSIS — Z882 Allergy status to sulfonamides status: Secondary | ICD-10-CM | POA: Diagnosis not present

## 2022-02-24 DIAGNOSIS — Z9889 Other specified postprocedural states: Secondary | ICD-10-CM | POA: Diagnosis not present

## 2022-02-24 DIAGNOSIS — Z87891 Personal history of nicotine dependence: Secondary | ICD-10-CM | POA: Diagnosis not present

## 2022-02-24 DIAGNOSIS — Z809 Family history of malignant neoplasm, unspecified: Secondary | ICD-10-CM | POA: Diagnosis not present

## 2022-02-24 DIAGNOSIS — Z8669 Personal history of other diseases of the nervous system and sense organs: Secondary | ICD-10-CM | POA: Diagnosis not present

## 2022-02-24 DIAGNOSIS — I517 Cardiomegaly: Secondary | ICD-10-CM | POA: Diagnosis not present

## 2022-02-24 DIAGNOSIS — E1110 Type 2 diabetes mellitus with ketoacidosis without coma: Secondary | ICD-10-CM | POA: Diagnosis not present

## 2022-02-24 DIAGNOSIS — R7989 Other specified abnormal findings of blood chemistry: Secondary | ICD-10-CM | POA: Diagnosis present

## 2022-02-24 DIAGNOSIS — H5440 Blindness, one eye, unspecified eye: Secondary | ICD-10-CM | POA: Diagnosis present

## 2022-02-24 DIAGNOSIS — I4891 Unspecified atrial fibrillation: Secondary | ICD-10-CM | POA: Diagnosis present

## 2022-02-24 DIAGNOSIS — E039 Hypothyroidism, unspecified: Secondary | ICD-10-CM | POA: Diagnosis present

## 2022-02-24 LAB — HEMOGLOBIN A1C: Hemoglobin A1C: 8.1

## 2022-02-25 DIAGNOSIS — H5440 Blindness, one eye, unspecified eye: Secondary | ICD-10-CM | POA: Diagnosis present

## 2022-02-25 DIAGNOSIS — E039 Hypothyroidism, unspecified: Secondary | ICD-10-CM | POA: Diagnosis present

## 2022-02-25 DIAGNOSIS — Z8673 Personal history of transient ischemic attack (TIA), and cerebral infarction without residual deficits: Secondary | ICD-10-CM | POA: Diagnosis not present

## 2022-02-25 DIAGNOSIS — E1022 Type 1 diabetes mellitus with diabetic chronic kidney disease: Secondary | ICD-10-CM | POA: Diagnosis present

## 2022-02-25 DIAGNOSIS — N1832 Chronic kidney disease, stage 3b: Secondary | ICD-10-CM | POA: Diagnosis present

## 2022-02-25 DIAGNOSIS — Z9889 Other specified postprocedural states: Secondary | ICD-10-CM | POA: Diagnosis not present

## 2022-02-25 DIAGNOSIS — Z8669 Personal history of other diseases of the nervous system and sense organs: Secondary | ICD-10-CM | POA: Diagnosis not present

## 2022-02-25 DIAGNOSIS — E1010 Type 1 diabetes mellitus with ketoacidosis without coma: Secondary | ICD-10-CM | POA: Diagnosis present

## 2022-02-25 DIAGNOSIS — Z809 Family history of malignant neoplasm, unspecified: Secondary | ICD-10-CM | POA: Diagnosis not present

## 2022-02-25 DIAGNOSIS — Z794 Long term (current) use of insulin: Secondary | ICD-10-CM | POA: Diagnosis not present

## 2022-02-25 DIAGNOSIS — I4891 Unspecified atrial fibrillation: Secondary | ICD-10-CM | POA: Diagnosis present

## 2022-02-25 DIAGNOSIS — Z87891 Personal history of nicotine dependence: Secondary | ICD-10-CM | POA: Diagnosis not present

## 2022-02-25 DIAGNOSIS — Z882 Allergy status to sulfonamides status: Secondary | ICD-10-CM | POA: Diagnosis not present

## 2022-02-25 DIAGNOSIS — Z9641 Presence of insulin pump (external) (internal): Secondary | ICD-10-CM | POA: Diagnosis present

## 2022-02-25 DIAGNOSIS — Z8249 Family history of ischemic heart disease and other diseases of the circulatory system: Secondary | ICD-10-CM | POA: Diagnosis not present

## 2022-02-25 DIAGNOSIS — I129 Hypertensive chronic kidney disease with stage 1 through stage 4 chronic kidney disease, or unspecified chronic kidney disease: Secondary | ICD-10-CM | POA: Diagnosis present

## 2022-02-25 DIAGNOSIS — E785 Hyperlipidemia, unspecified: Secondary | ICD-10-CM | POA: Diagnosis present

## 2022-02-25 DIAGNOSIS — R7989 Other specified abnormal findings of blood chemistry: Secondary | ICD-10-CM | POA: Diagnosis present

## 2022-02-25 DIAGNOSIS — Z888 Allergy status to other drugs, medicaments and biological substances status: Secondary | ICD-10-CM | POA: Diagnosis not present

## 2022-02-25 DIAGNOSIS — I083 Combined rheumatic disorders of mitral, aortic and tricuspid valves: Secondary | ICD-10-CM | POA: Diagnosis not present

## 2022-02-28 ENCOUNTER — Telehealth: Payer: Self-pay

## 2022-02-28 NOTE — Telephone Encounter (Signed)
Transition Care Management Follow-up Telephone Call Date of discharge and from where: Carmen Cooper 02/27/2022 How have you been since you were released from the hospital? better Any questions or concerns? No  Items Reviewed: Did the pt receive and understand the discharge instructions provided? Yes  Medications obtained and verified? Yes  Other? No  Any new allergies since your discharge? No  Dietary orders reviewed? Yes Do you have support at home? Yes   Home Care and Equipment/Supplies: Were home health services ordered? no If so, what is the name of the agency? N/a  Has the agency set up a time to come to the patient's home? not applicable Were any new equipment or medical supplies ordered?  No What is the name of the medical supply agency? N/a Were you able to get the supplies/equipment? not applicable Do you have any questions related to the use of the equipment or supplies? No  Functional Questionnaire: (I = Independent and D = Dependent) ADLs: I  Bathing/Dressing- D  Meal Prep- D  Eating- I  Maintaining continence- I  Transferring/Ambulation- D  Managing Meds- D  Follow up appointments reviewed:  PCP Hospital f/u appt confirmed? No  no avail appt times. Sent message to staff to schedule Specialist Hospital f/u appt confirmed? Yes  Scheduled to see eye surgeon on 03/06/2022 @ 9:00. Are transportation arrangements needed? No  If their condition worsens, is the pt aware to call PCP or go to the Emergency Dept.? Yes Was the patient provided with contact information for the PCP's office or ED? Yes Was to pt encouraged to call back with questions or concerns? Yes [name], LPN [place] Direct Dial [phone]

## 2022-03-06 ENCOUNTER — Ambulatory Visit: Payer: Medicare Other | Admitting: Cardiology

## 2022-03-06 DIAGNOSIS — H4043X3 Glaucoma secondary to eye inflammation, bilateral, severe stage: Secondary | ICD-10-CM | POA: Diagnosis not present

## 2022-03-06 DIAGNOSIS — H209 Unspecified iridocyclitis: Secondary | ICD-10-CM | POA: Diagnosis not present

## 2022-03-06 DIAGNOSIS — Z9889 Other specified postprocedural states: Secondary | ICD-10-CM | POA: Diagnosis not present

## 2022-03-16 DIAGNOSIS — H209 Unspecified iridocyclitis: Secondary | ICD-10-CM | POA: Diagnosis not present

## 2022-03-16 DIAGNOSIS — H353212 Exudative age-related macular degeneration, right eye, with inactive choroidal neovascularization: Secondary | ICD-10-CM | POA: Diagnosis not present

## 2022-03-16 DIAGNOSIS — H3581 Retinal edema: Secondary | ICD-10-CM | POA: Diagnosis not present

## 2022-03-16 DIAGNOSIS — H401120 Primary open-angle glaucoma, left eye, stage unspecified: Secondary | ICD-10-CM | POA: Diagnosis not present

## 2022-03-16 DIAGNOSIS — Z961 Presence of intraocular lens: Secondary | ICD-10-CM | POA: Diagnosis not present

## 2022-03-16 DIAGNOSIS — H30031 Focal chorioretinal inflammation, peripheral, right eye: Secondary | ICD-10-CM | POA: Diagnosis not present

## 2022-03-16 DIAGNOSIS — Z79899 Other long term (current) drug therapy: Secondary | ICD-10-CM | POA: Diagnosis not present

## 2022-03-16 DIAGNOSIS — H4041X1 Glaucoma secondary to eye inflammation, right eye, mild stage: Secondary | ICD-10-CM | POA: Diagnosis not present

## 2022-03-16 DIAGNOSIS — H44111 Panuveitis, right eye: Secondary | ICD-10-CM | POA: Diagnosis not present

## 2022-03-16 DIAGNOSIS — H353221 Exudative age-related macular degeneration, left eye, with active choroidal neovascularization: Secondary | ICD-10-CM | POA: Diagnosis not present

## 2022-03-17 ENCOUNTER — Ambulatory Visit (INDEPENDENT_AMBULATORY_CARE_PROVIDER_SITE_OTHER): Payer: Medicare Other | Admitting: Family Medicine

## 2022-03-17 DIAGNOSIS — Z0000 Encounter for general adult medical examination without abnormal findings: Secondary | ICD-10-CM

## 2022-03-17 NOTE — Progress Notes (Signed)
MEDICARE ANNUAL WELLNESS VISIT  03/17/2022  Telephone Visit Disclaimer This Medicare AWV was conducted by telephone due to national recommendations for restrictions regarding the COVID-19 Pandemic (e.g. social distancing).  I verified, using two identifiers, that I am speaking with Carmen Cooper or their authorized healthcare agent. I discussed the limitations, risks, security, and privacy concerns of performing an evaluation and management service by telephone and the potential availability of an in-person appointment in the future. The patient expressed understanding and agreed to proceed.  Location of Patient: home Location of Provider (nurse):  in the office  Subjective:    Carmen Cooper is a 82 y.o. female patient of Luetta Nutting, DO who had a Medicare Annual Wellness Visit today via telephone. Carmen Cooper is Retired and lives alone. she has 1 child. she reports that she is socially active and does interact with friends/family regularly. she is minimally physically active and enjoys crochet and gardening.  Patient Care Team: Luetta Nutting, DO as PCP - General (Family Medicine) Constance Haw, MD as PCP - Cardiology (Cardiology) Janie Morning, MD (Gastroenterology) Gerome Apley, MD as Referring Physician (Endocrinology) Feliz Beam, MD as Referring Physician (Ophthalmology) Ander Slade Carlisle Beers, MD as Referring Physician (Ophthalmology) Constance Haw, MD as Consulting Physician (Cardiology)     03/17/2022    9:45 AM 03/11/2021    8:10 AM 02/12/2019    9:23 AM 04/11/2018   11:52 AM 02/12/2018    8:15 AM  Advanced Directives  Does Patient Have a Medical Advance Directive? Yes Yes Yes Yes Yes  Type of Advance Directive Living will Living will;Healthcare Power of [place];Living will [place];Living will [place];Living will  Does patient want to make changes to medical advance directive? No -  Patient declined No - Patient declined   No - Patient declined  Copy of [place] in Chart?  No - copy requested   No - copy requested    Hospital Utilization Over the Past 12 Months: # of hospitalizations or ER visits: 4 # of surgeries: 4  Review of Systems    Patient reports that her overall health is little worse, due to eyes,compared to last year.  History obtained from chart review and the patient  Patient Reported Readings (BP, Pulse, CBG, Weight, etc) none  Pain Assessment Pain : No/denies pain     Current Medications & Allergies (verified) Allergies as of 03/17/2022       Reactions   Dexamethasone Anaphylaxis, Other (See Comments)   Blood sugar elevated    Brimonidine Tartrate Other (See Comments)   Burning and redness   Clindamycin/lincomycin Rash   Sulfa Antibiotics Rash   Valacyclovir Hcl Rash        Medication List        Accurate as of March 17, 2022 10:08 AM. If you have any questions, ask your nurse or doctor.          acetaminophen 650 MG CR tablet Commonly known as: TYLENOL Take 1 tablet (650 mg total) by mouth every 8 (eight) hours as needed for pain.   AMBULATORY NON FORMULARY MEDICATION Freestyle light test strips Test twice a day  Dx type 2 diabetes E11.9   apixaban 2.5 MG Tabs tablet Commonly known as: Eliquis TAKE 1 TABLET TWICE A DAY (DISCONTINUE ELIQUIS 5 MG TABLETS)   atorvastatin 40 MG tablet Commonly known as: LIPITOR TAKE 1 TABLET DAILY   Baqsimi Two Pack 3 MG/DOSE  Powd Generic drug: Glucagon Place into both nostrils.   CENTRUM SILVER 50+WOMEN PO Take 1 tablet by mouth daily.   Cosopt PF 2-0.5 % Soln Generic drug: Dorzolamide HCl-Timolol Mal PF Place 1 drop into both eyes in the morning and at bedtime.   diltiazem 60 MG 12 hr capsule Commonly known as: CARDIZEM SR TAKE 1 CAPSULE TWICE A DAY   dorzolamide 2 % ophthalmic solution Commonly known as: TRUSOPT   folic acid 1 MG tablet Commonly  known as: FOLVITE Take 1 mg by mouth daily.   FREESTYLE LITE test strip Generic drug: glucose blood   insulin lispro 100 UNIT/ML injection Commonly known as: HUMALOG Medtronic 630G pump.  Basal 12-6a 0.625, 6a-7p 0.725, 7p-12a 0.625.  Preset bolus:  4/5/6.  ISF 50.  Total daily dose:  40 units/day   insulin pump Soln Inject into the skin as directed. insulin lispro (HUMALOG) 100 UNIT/ML   Lantus SoloStar 100 UNIT/ML Solostar Pen Generic drug: insulin glargine Use in case of pump failure: Remove pump and take 15 units of Lantus insulin as basal insulin in case of insulin pump malfunction/failure. .Do not place pump back on until 20 hours AFTER last dose of Lantus to prevent "double basal infusion."   lisinopril 20 MG tablet Commonly known as: ZESTRIL TAKE ONE-HALF (1/2) TABLET DAILY   moxifloxacin 0.5 % ophthalmic solution Commonly known as: VIGAMOX Place 1 drop into the left eye 3 times daily.   mycophenolate 500 MG tablet Commonly known as: CELLCEPT Take by mouth.   neomycin-polymyxin b-dexamethasone 3.5-10000-0.1 Oint Commonly known as: MAXITROL Place into the left eye at bedtime.   nepafenac 0.1 % ophthalmic suspension Commonly known as: [place] 1 drop into the right eye 3 (three) times daily.   ofloxacin 0.3 % ophthalmic solution Commonly known as: OCUFLOX Place 1 drop into the right eye 4 times daily.   Omega-3 1000 MG Caps Take 1,000 mg by mouth daily.   Omnipod DASH Pods (Gen 4) Misc Inject into the skin as directed.   prednisoLONE acetate 1 % ophthalmic suspension Commonly known as: PRED FORTE   Sure Comfort Pen Needles 31G X 8 MM Misc Generic drug: Insulin Pen Needle   timolol 0.5 % ophthalmic solution Commonly known as: TIMOPTIC   Tirosint 88 MCG Caps Generic drug: Levothyroxine Sodium Take by mouth.   valACYclovir 1000 MG tablet Commonly known as: VALTREX Take 1,000 mg by mouth daily.   Xiidra 5 % Soln Generic drug: Lifitegrast Place  1 drop into both eyes daily.   Zioptan 0.0015 % Soln Generic drug: Tafluprost (PF) Place 1 drop into both eyes at bedtime.        History (reviewed): Past Medical History:  Diagnosis Date   Atrial fibrillation ([place])    BCC (basal cell carcinoma of skin)    Diabetes ([place])    Glaucoma    History of TIA (transient ischemic attack) 08/11/2013   12/2012 - [name]    Hypertension    Hypothyroidism 08/11/2013   Memory changes    Microscopic colitis 08/21/2013   2008 [facility] [name].  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, [facility]    Past Surgical History:  Procedure Laterality Date   BREAST EXCISIONAL BIOPSY Left    BREAST EXCISIONAL BIOPSY Left    CARDIOVERSION N/A 02/12/2019   Procedure: CARDIOVERSION;  Surgeon: [name], MD;  Location: [facility]  ENDOSCOPY;  Service: Cardiovascular;  Laterality: N/A;   MOHS SURGERY  2019   Nose bcc    OTHER SURGICAL HISTORY  04/01/2019   biopsy on nose and lip    Family History  Problem Relation Age of Onset   Heart disease Son    Hypertension Mother    Cancer Mother        unsure of origin   Heart attack Father    Diabetes Neg Hx    Social History   Socioeconomic History   Marital status: Widowed    Spouse name: [name]   Number of children: 1   Years of education: 12   Highest education level: 12th grade  Occupational History   Occupation: Retired    Comment: retired  Tobacco Use   Smoking status: Former    Packs/day: 0.25    Years: 20.00    Total pack years: 5.00    Types: Cigarettes   Smokeless tobacco: Never  Vaping Use   Vaping Use: Never used  Substance and Sexual Activity   Alcohol use: Not Currently   Drug use: No   Sexual activity: Not Currently    Partners: Male  Other Topics Concern   Not on file  Social History Narrative   Lives alone. She has one son, who takes her to her doctor's appointments. She  enjoys crochet and gardening.   Social Determinants of Health   Financial Resource Strain: Low Risk  (03/17/2022)   Overall Financial Resource Strain (CARDIA)    Difficulty of Paying Living Expenses: Not hard at all  Food Insecurity: No Food Insecurity (03/17/2022)   Hunger Vital Sign    Worried About Running Out of Food in the Last Year: Never true    Ran Out of Food in the Last Year: Never true  Transportation Needs: No Transportation Needs (03/17/2022)   PRAPARE - Hydrologist (Medical): No    Lack of Transportation (Non-Medical): No  Physical Activity: Sufficiently Active (03/17/2022)   Exercise Vital Sign    Days of Exercise per Week: 4 days    Minutes of Exercise per Session: 40 min  Stress: No Stress Concern Present (03/17/2022)   [place]    Feeling of Stress : Not at all  Social Connections: Socially Isolated (03/17/2022)   Social Connection and Isolation Panel [NHANES]    Frequency of Communication with Friends and Family: More than three times a week    Frequency of Social Gatherings with Friends and Family: More than three times a week    Attends Religious Services: Never    Marine scientist or Organizations: No    Attends Archivist Meetings: Never    Marital Status: Widowed    Activities of Daily Living    03/17/2022    9:54 AM  In your present state of health, do you have any difficulty performing the following activities:  Hearing? 0  Vision? 1  Comment under treatment  Difficulty concentrating or making decisions? 0  Walking or climbing stairs? 0  Dressing or bathing? 0  Doing errands, shopping? 1  Comment does not drive  Preparing Food and eating ? N  Using the Toilet? N  In the past six months, have you accidently leaked urine? Y  Comment due to diabetes  Do you have problems with loss of bowel control? N  Managing your Medications? N  Managing your  Finances? N  Housekeeping or managing  your Housekeeping? N    Patient Education/ Literacy How often do you need to have someone help you when you read instructions, pamphlets, or other written materials from your doctor or pharmacy?: 1 - Never What is the last grade level you completed in school?: some college  Exercise Current Exercise Habits: Home exercise routine, Type of exercise: walking, Time (Minutes): 40, Frequency (Times/Week): 4, Weekly Exercise (Minutes/Week): 160, Intensity: Moderate, Exercise limited by: Other - see comments (vision deficit - currently under treatment)  Diet Patient reports consuming 3 meals a day and 1 snack(s) a day Patient reports that her primary diet is: Regular Patient reports that she does have regular access to food.   Depression Screen    03/17/2022    9:46 AM 02/15/2022    4:32 PM 03/11/2021    8:11 AM 09/06/2020    8:49 AM 01/02/2019    3:32 PM 04/03/2018    8:48 AM 02/12/2018    8:16 AM  PHQ 2/9 Scores  PHQ - 2 Score '1 3 2 2 '$ 0 5 2  PHQ- 9 Score   '2   13 6     '$ Fall Risk    03/17/2022    9:46 AM 02/15/2022    4:32 PM 08/15/2021    8:15 AM 03/11/2021    8:10 AM 03/09/2021    2:32 PM  Fall Risk   Falls in the past year? 1 0 0 0 0  Number falls in past yr: 0 0 1 0 0  Injury with Fall? 0 0 0 0 0  Risk for fall due to : History of fall(s) No Fall Risks Impaired vision No Fall Risks No Fall Risks  Follow up Falls evaluation completed;Education provided Falls evaluation completed Falls evaluation completed Falls evaluation completed;Education provided Falls evaluation completed     Objective:  Carmen Cooper seemed alert and oriented and she participated appropriately during our telephone visit.  Blood Pressure Weight BMI  BP Readings from Last 3 Encounters:  02/15/22 (!) 157/78  01/17/22 (!) 149/80  08/15/21 (!) 143/81   Wt Readings from Last 3 Encounters:  02/15/22 131 lb (59.4 kg)  01/17/22 132 lb (59.9 kg)  08/15/21 135 lb (61.2 kg)   BMI  Readings from Last 1 Encounters:  02/15/22 21.80 kg/m    *Unable to obtain current vital signs, weight, and BMI due to telephone visit type  Hearing/Vision  Carmen Cooper did not seem to have difficulty with hearing/understanding during the telephone conversation Reports that she has had a formal eye exam by an eye care professional within the past year Reports that she has not had a formal hearing evaluation within the past year *Unable to fully assess hearing and vision during telephone visit type  Cognitive Function:    03/17/2022    9:58 AM 03/11/2021    8:28 AM 02/12/2018    8:24 AM  6CIT Screen  What Year? 0 points 0 points 0 points  What month? 0 points 0 points 0 points  What time? 0 points 0 points 0 points  Count back from 20 0 points 0 points 0 points  Months in reverse 0 points 2 points 0 points  Repeat phrase 2 points 0 points 0 points  Total Score 2 points 2 points 0 points   (Normal:0-7, Significant for Dysfunction: >8)  Normal Cognitive Function Screening: Yes   Immunization & Health Maintenance Record Immunization History  Administered Date(s) Administered   19-influenza Whole 10/24/2018   Fluad Quad(high Dose 65+) 10/24/2018, 03/09/2020  Influenza Whole 10/22/2015, 10/30/2016, 11/30/2016, 11/19/2017   Influenza, High Dose Seasonal PF 10/22/2015, 10/30/2016   Influenza,inj,Quad PF,6+ Mos 11/11/2013, 09/27/2017   Influenza-Unspecified 11/18/2014, 11/13/2020   PFIZER Comirnaty(Gray Top)Covid-19 Tri-Sucrose Vaccine 11/25/2019   PFIZER(Purple Top)SARS-COV-2 Vaccination 02/27/2019, 03/20/2019, 11/25/2019, 08/17/2020, 11/15/2021   Pneumococcal Conjugate-13 10/22/2015, 04/15/2019   Pneumococcal Polysaccharide-23 12/25/2012, 03/06/2017   Pneumococcal-Unspecified 12/25/2012, 03/06/2017   Tdap 07/26/2015, 06/25/2021   Zoster Recombinat (Shingrix) 03/26/2021   Zoster, Live 11/04/2015    Health Maintenance  Topic Date Due   OPHTHALMOLOGY EXAM  03/17/2022 (Originally  07/21/2021)   Diabetic kidney evaluation - Urine ACR  03/18/2022 (Originally 02/11/2015)   FOOT EXAM  03/18/2022 (Originally 03/09/2022)   HEMOGLOBIN A1C  03/18/2022 (Originally 02/15/2022)   COVID-19 Vaccine (7 - 2023-24 season) 04/02/2022 (Originally 01/10/2022)   Zoster Vaccines- Shingrix (2 of 2) 04/18/2022 (Originally 05/21/2021)   INFLUENZA VACCINE  05/14/2022 (Originally 09/13/2021)   Diabetic kidney evaluation - eGFR measurement  01/18/2023   Medicare Annual Wellness (AWV)  03/18/2023   DTaP/Tdap/Td (3 - Td or Tdap) 06/26/2031   Pneumonia Vaccine 25+ Years old  Completed   DEXA SCAN  Completed   HPV VACCINES  Aged Out       Assessment  This is a routine wellness examination for [employer].  Health Maintenance: Due or Overdue There are no preventive care reminders to display for this patient.   Carmen Cooper does not need a referral for Community Assistance: Care Management:   no Social Work:    no Prescription Assistance:  no Nutrition/Diabetes Education:  no   Plan:  Personalized Goals  Goals Addressed               This Visit's Progress     Patient Stated (pt-stated)        Patient stated that she would like to be able to manage her diabetes and afib better.       Personalized Health Maintenance & Screening Recommendations  Eye exam- need records Urine ACR Foot exam Hemoglobin A1C Shingrix vaccine - need records Influenza vaccine - need records  Lung Cancer Screening Recommended: no (Low Dose CT Chest recommended if Age 7-80 years, 30 pack-year currently smoking OR have quit w/in past 15 years) Hepatitis C Screening recommended: no HIV Screening recommended: no  Advanced Directives: Written information was not prepared per patient's request.  Referrals & Orders No orders of the defined types were placed in this encounter.   Follow-up Plan Follow-up with Luetta Nutting, DO as planned Schedule 2nd dose of shingles vaccine. Need records eye exam and  the vaccines. Medicare wellness visit in one year.  AVS printed and mailed to the patient.   I have personally reviewed and noted the following in the patient's chart:   Medical and social history Use of alcohol, tobacco or illicit drugs  Current medications and supplements Functional ability and status Nutritional status Physical activity Advanced directives List of other physicians Hospitalizations, surgeries, and ER visits in previous 12 months Vitals Screenings to include cognitive, depression, and falls Referrals and appointments  In addition, I have reviewed and discussed with Carmen Cooper certain preventive protocols, quality metrics, and best practice recommendations. A written personalized care plan for preventive services as well as general preventive health recommendations is available and can be mailed to the patient at her request.      Tinnie Gens, RN BSN  03/17/2022

## 2022-03-17 NOTE — Patient Instructions (Addendum)
[facility] Maintenance Summary and Written Plan of Care  [name] ,  Thank you for allowing me to perform your Medicare Annual Wellness Visit and for your ongoing commitment to your health.   Health Maintenance & Immunization History Health Maintenance  Topic Date Due   OPHTHALMOLOGY EXAM  03/17/2022 (Originally 07/21/2021)   Diabetic kidney evaluation - Urine ACR  03/18/2022 (Originally 02/11/2015)   FOOT EXAM  03/18/2022 (Originally 03/09/2022)   HEMOGLOBIN A1C  03/18/2022 (Originally 02/15/2022)   COVID-19 Vaccine (7 - 2023-24 season) 04/02/2022 (Originally 01/10/2022)   Zoster Vaccines- Shingrix (2 of 2) 04/18/2022 (Originally 05/21/2021)   INFLUENZA VACCINE  05/14/2022 (Originally 09/13/2021)   Diabetic kidney evaluation - eGFR measurement  01/18/2023   Medicare Annual Wellness (AWV)  03/18/2023   DTaP/Tdap/Td (3 - Td or Tdap) 06/26/2031   Pneumonia Vaccine 75+ Years old  Completed   DEXA SCAN  Completed   HPV VACCINES  Aged Out   Immunization History  Administered Date(s) Administered   19-influenza Whole 10/24/2018   Fluad Quad(high Dose 65+) 10/24/2018, 03/09/2020   Influenza Whole 10/22/2015, 10/30/2016, 11/30/2016, 11/19/2017   Influenza, High Dose Seasonal PF 10/22/2015, 10/30/2016   Influenza,inj,Quad PF,6+ Mos 11/11/2013, 09/27/2017   Influenza-Unspecified 11/18/2014, 11/13/2020   PFIZER Comirnaty(Gray Top)Covid-19 Tri-Sucrose Vaccine 11/25/2019   PFIZER(Purple Top)SARS-COV-2 Vaccination 02/27/2019, 03/20/2019, 11/25/2019, 08/17/2020, 11/15/2021   Pneumococcal Conjugate-13 10/22/2015, 04/15/2019   Pneumococcal Polysaccharide-23 12/25/2012, 03/06/2017   Pneumococcal-Unspecified 12/25/2012, 03/06/2017   Tdap 07/26/2015, 06/25/2021   Zoster Recombinat (Shingrix) 03/26/2021   Zoster, Live 11/04/2015    These are the patient goals that we discussed:  Goals Addressed               This Visit's Progress     Patient Stated (pt-stated)         Patient stated that she would like to be able to manage her diabetes and afib better.         This is a list of Health Maintenance Items that are overdue or due now: Eye exam- need records Urine ACR Foot exam Hemoglobin A1C Shingrix vaccine - need records Influenza vaccine - need records    Orders/Referrals Placed Today: No orders of the defined types were placed in this encounter.  (Contact our referral department at [phone] if you have not spoken with someone about your referral appointment within the next 5 days)    Follow-up Plan Follow-up with Luetta Nutting, DO as planned Schedule 2nd dose of shingles vaccine. Need records eye exam and the vaccines. Medicare wellness visit in one year.  AVS printed and mailed to the patient.      Health Maintenance, Female Adopting a healthy lifestyle and getting preventive care are important in promoting health and wellness. Ask your health care provider about: The right schedule for you to have regular tests and exams. Things you can do on your own to prevent diseases and keep yourself healthy. What should I know about diet, weight, and exercise? Eat a healthy diet  Eat a diet that includes plenty of vegetables, fruits, low-fat dairy products, and lean protein. Do not eat a lot of foods that are high in solid fats, added sugars, or sodium. Maintain a healthy weight Body mass index (BMI) is used to identify weight problems. It estimates body fat based on height and weight. Your health care provider can help determine your BMI and help you achieve or maintain a healthy weight. Get regular exercise Get regular exercise. This is one of  the most important things you can do for your health. Most adults should: Exercise for at least 150 minutes each week. The exercise should increase your heart rate and make you sweat (moderate-intensity exercise). Do strengthening exercises at least twice a week. This is in addition to the  moderate-intensity exercise. Spend less time sitting. Even light physical activity can be beneficial. Watch cholesterol and blood lipids Have your blood tested for lipids and cholesterol at 83 years of age, then have this test every 5 years. Have your cholesterol levels checked more often if: Your lipid or cholesterol levels are high. You are older than 83 years of age. You are at high risk for heart disease. What should I know about cancer screening? Depending on your health history and family history, you may need to have cancer screening at various ages. This may include screening for: Breast cancer. Cervical cancer. Colorectal cancer. Skin cancer. Lung cancer. What should I know about heart disease, diabetes, and high blood pressure? Blood pressure and heart disease High blood pressure causes heart disease and increases the risk of stroke. This is more likely to develop in people who have high blood pressure readings or are overweight. Have your blood pressure checked: Every 3-5 years if you are 7-2 years of age. Every year if you are 37 years old or older. Diabetes Have regular diabetes screenings. This checks your fasting blood sugar level. Have the screening done: Once every three years after age 58 if you are at a normal weight and have a low risk for diabetes. More often and at a younger age if you are overweight or have a high risk for diabetes. What should I know about preventing infection? Hepatitis B If you have a higher risk for hepatitis B, you should be screened for this virus. Talk with your health care provider to find out if you are at risk for hepatitis B infection. Hepatitis C Testing is recommended for: Everyone born from [birth year] through 1966. Anyone with known risk factors for hepatitis C. Sexually transmitted infections (STIs) Get screened for STIs, including gonorrhea and chlamydia, if: You are sexually active and are younger than 83 years of age. You are  older than 83 years of age and your health care provider tells you that you are at risk for this type of infection. Your sexual activity has changed since you were last screened, and you are at increased risk for chlamydia or gonorrhea. Ask your health care provider if you are at risk. Ask your health care provider about whether you are at high risk for HIV. Your health care provider may recommend a prescription medicine to help prevent HIV infection. If you choose to take medicine to prevent HIV, you should first get tested for HIV. You should then be tested every 3 months for as long as you are taking the medicine. Pregnancy If you are about to stop having your period (premenopausal) and you may become pregnant, seek counseling before you get pregnant. Take 400 to 800 micrograms (mcg) of folic acid every day if you become pregnant. Ask for birth control (contraception) if you want to prevent pregnancy. Osteoporosis and menopause Osteoporosis is a disease in which the bones lose minerals and strength with aging. This can result in bone fractures. If you are 49 years old or older, or if you are at risk for osteoporosis and fractures, ask your health care provider if you should: Be screened for bone loss. Take a calcium or vitamin D supplement to lower  your risk of fractures. Be given hormone replacement therapy (HRT) to treat symptoms of menopause. Follow these instructions at home: Alcohol use Do not drink alcohol if: Your health care provider tells you not to drink. You are pregnant, may be pregnant, or are planning to become pregnant. If you drink alcohol: Limit how much you have to: 0-1 drink a day. Know how much alcohol is in your drink. In the [place] one drink equals one 12 oz bottle of beer (355 mL), one 5 oz glass of wine (148 mL), or one 1 oz glass of hard liquor (44 mL). Lifestyle Do not use any products that contain nicotine or tobacco. These products include cigarettes, chewing  tobacco, and vaping devices, such as e-cigarettes. If you need help quitting, ask your health care provider. Do not use street drugs. Do not share needles. Ask your health care provider for help if you need support or information about quitting drugs. General instructions Schedule regular health, dental, and eye exams. Stay current with your vaccines. Tell your health care provider if: You often feel depressed. You have ever been abused or do not feel safe at home. Summary Adopting a healthy lifestyle and getting preventive care are important in promoting health and wellness. Follow your health care provider's instructions about healthy diet, exercising, and getting tested or screened for diseases. Follow your health care provider's instructions on monitoring your cholesterol and blood pressure. This information is not intended to replace advice given to you by your health care provider. Make sure you discuss any questions you have with your health care provider. Document Revised: 06/21/2020 Document Reviewed: 06/21/2020 Elsevier Patient Education  [place].

## 2022-03-31 DIAGNOSIS — Z794 Long term (current) use of insulin: Secondary | ICD-10-CM | POA: Diagnosis not present

## 2022-03-31 DIAGNOSIS — B0052 Herpesviral keratitis: Secondary | ICD-10-CM | POA: Diagnosis not present

## 2022-03-31 DIAGNOSIS — Z947 Corneal transplant status: Secondary | ICD-10-CM | POA: Diagnosis not present

## 2022-03-31 DIAGNOSIS — H44111 Panuveitis, right eye: Secondary | ICD-10-CM | POA: Diagnosis not present

## 2022-03-31 DIAGNOSIS — H4043X3 Glaucoma secondary to eye inflammation, bilateral, severe stage: Secondary | ICD-10-CM | POA: Diagnosis not present

## 2022-03-31 DIAGNOSIS — Z79899 Other long term (current) drug therapy: Secondary | ICD-10-CM | POA: Diagnosis not present

## 2022-03-31 DIAGNOSIS — H30031 Focal chorioretinal inflammation, peripheral, right eye: Secondary | ICD-10-CM | POA: Diagnosis not present

## 2022-03-31 DIAGNOSIS — H401120 Primary open-angle glaucoma, left eye, stage unspecified: Secondary | ICD-10-CM | POA: Diagnosis not present

## 2022-03-31 DIAGNOSIS — H1820 Unspecified corneal edema: Secondary | ICD-10-CM | POA: Diagnosis not present

## 2022-03-31 DIAGNOSIS — H209 Unspecified iridocyclitis: Secondary | ICD-10-CM | POA: Diagnosis not present

## 2022-03-31 DIAGNOSIS — H4041X1 Glaucoma secondary to eye inflammation, right eye, mild stage: Secondary | ICD-10-CM | POA: Diagnosis not present

## 2022-03-31 DIAGNOSIS — E119 Type 2 diabetes mellitus without complications: Secondary | ICD-10-CM | POA: Diagnosis not present

## 2022-04-03 ENCOUNTER — Ambulatory Visit: Payer: Medicare Other | Attending: Cardiology | Admitting: Cardiology

## 2022-04-03 ENCOUNTER — Encounter: Payer: Self-pay | Admitting: Cardiology

## 2022-04-03 ENCOUNTER — Ambulatory Visit: Payer: Medicare Other | Attending: Cardiology

## 2022-04-03 VITALS — BP 160/78 | HR 62 | Ht 65.0 in | Wt 134.0 lb

## 2022-04-03 DIAGNOSIS — I4819 Other persistent atrial fibrillation: Secondary | ICD-10-CM | POA: Insufficient documentation

## 2022-04-03 DIAGNOSIS — D6869 Other thrombophilia: Secondary | ICD-10-CM | POA: Insufficient documentation

## 2022-04-03 DIAGNOSIS — I5022 Chronic systolic (congestive) heart failure: Secondary | ICD-10-CM

## 2022-04-03 NOTE — Progress Notes (Unsigned)
Enrolled for [employer] to mail a ZIO XT long term holter monitor to the patients address on file.  

## 2022-04-03 NOTE — Progress Notes (Signed)
Electrophysiology Office Note   Date:  04/03/2022   ID:  Carmen Cooper, DOB Jun 26, 1940, MRN [id number]  PCP:  Luetta Nutting, DO  Cardiologist:   Primary Electrophysiologist:  Keny Donald Meredith Leeds, MD    Chief Complaint: SOB   History of Present Illness: Carmen Cooper is a 82 y.o. female who is being seen today for the evaluation of AF at the request of Luetta Nutting, DO. Presenting today for electrophysiology evaluation.  The history significant hypertension, aortic atherosclerosis, diabetes, CKD.  She developed chest pain and shortness of breath and was found to be in atrial fibrillation by her primary physician.  Ejection fraction was 40 to 45% at the time.  Today, denies symptoms of palpitations, chest pain, shortness of breath, orthopnea, PND, lower extremity edema, claudication, dizziness, presyncope, syncope, bleeding, or neurologic sequela. The patient is tolerating medications without difficulties.  Since being seen she has overall done well.  She has noted a little bit more atrial fibrillation over the last few months.  She states that this occurs when her blood sugars have been difficult to control.  She has palpitations and fatigue when she has this.   Past Medical History:  Diagnosis Date   Atrial fibrillation ([place])    BCC (basal cell carcinoma of skin)    Diabetes ([place])    Glaucoma    History of TIA (transient ischemic attack) 08/11/2013   12/2012 - [name]    Hypertension    Hypothyroidism 08/11/2013   Memory changes    Microscopic colitis 08/21/2013   2008 [facility] [name].  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, [facility]    Past Surgical History:  Procedure Laterality Date   BREAST EXCISIONAL BIOPSY Left    BREAST EXCISIONAL BIOPSY Left    CARDIOVERSION N/A 02/12/2019   Procedure: CARDIOVERSION;  Surgeon: [name], MD;  Location: [facility] END[facility]COPY;  Service:  Cardiovascular;  Laterality: N/A;   MOHS SURGERY  2019   Nose bcc    OTHER SURGICAL HISTORY  04/01/2019   biopsy on nose and lip      Current Outpatient Medications  Medication Sig Dispense Refill   acetaminophen (TYLENOL) 650 MG CR tablet Take 1 tablet (650 mg total) by mouth every 8 (eight) hours as needed for pain. 90 tablet 3   AMBULATORY NON FORMULARY MEDICATION Freestyle light test strips Test twice a day  Dx type 2 diabetes E11.9 100 each 11   apixaban (ELIQUIS) 2.5 MG TABS tablet TAKE 1 TABLET TWICE A DAY (DISCONTINUE ELIQUIS 5 MG TABLETS) 180 tablet 1   atorvastatin (LIPITOR) 40 MG tablet TAKE 1 TABLET DAILY 90 tablet 3   BAQSIMI TWO PACK 3 MG/DOSE POWD Place into both nostrils.     COSOPT PF 2-0.5 % SOLN ophthalmic solution Place 1 drop into both eyes in the morning and at bedtime.     diltiazem (CARDIZEM SR) 60 MG 12 hr capsule TAKE 1 CAPSULE TWICE A DAY [id number] capsule 3   folic acid (FOLVITE) 1 MG tablet Take 1 mg by mouth daily.      FREESTYLE LITE test strip      Insulin Disposable Pump (OMNIPOD DASH 5 PACK PODS) MISC Inject into the skin as directed.     insulin glargine (LANTUS SOLOSTAR) 100 UNIT/ML Solostar Pen Use in case of pump failure: Remove pump and take 15 units of Lantus insulin as basal  insulin in case of insulin pump malfunction/failure. .Do not place pump back on until 20 hours AFTER last dose of Lantus to prevent "double basal infusion."     Insulin Human (INSULIN PUMP) SOLN Inject into the skin as directed. insulin lispro (HUMALOG) 100 UNIT/ML     insulin lispro (HUMALOG) 100 UNIT/ML injection Medtronic 630G pump.  Basal 12-6a 0.625, 6a-7p 0.725, 7p-12a 0.625.  Preset bolus:  4/5/6.  ISF 50.  Total daily dose:  40 units/day     Lifitegrast (XIIDRA) 5 % SOLN Place 1 drop into both eyes daily.      lisinopril (ZESTRIL) 20 MG tablet TAKE ONE-HALF (1/2) TABLET DAILY 45 tablet 1   Multiple Vitamins-Minerals (CENTRUM SILVER 50+WOMEN PO) Take 1 tablet by mouth daily.       mycophenolate (CELLCEPT) 500 MG tablet Take 500 mg by mouth 2 (two) times daily.     neomycin-polymyxin b-dexamethasone (MAXITROL) 3.5-10000-0.1 OINT Place into the left eye at bedtime.     nepafenac (NEVANAC) 0.1 % ophthalmic suspension Place 1 drop into the right eye 3 (three) times daily.      Omega-3 1000 MG CAPS Take 1,000 mg by mouth daily.      prednisoLONE acetate (PRED FORTE) 1 % ophthalmic suspension      SURE COMFORT PEN NEEDLES 31G X 8 MM MISC      TIROSINT 88 MCG CAPS Take 88 mcg by mouth daily.     valACYclovir (VALTREX) 1000 MG tablet Take 1,000 mg by mouth daily.     ZIOPTAN 0.0015 % SOLN Place 1 drop into both eyes at bedtime.      moxifloxacin (VIGAMOX) 0.5 % ophthalmic solution Place 1 drop into the left eye 3 times daily.     timolol (TIMOPTIC) 0.5 % ophthalmic solution      No current facility-administered medications for this visit.    Allergies:   Dexamethasone, Brimonidine tartrate, Clindamycin/lincomycin, Sulfa antibiotics, and Valacyclovir hcl   Social History:  The patient  reports that she has quit smoking. Her smoking use included cigarettes. She has a 5.00 pack-year smoking history. She has never used smokeless tobacco. She reports that she does not currently use alcohol. She reports that she does not use drugs.   Family History:  The patient's family history includes Cancer in her mother; Heart attack in her father; Heart disease in her son; Hypertension in her mother.   ROS:  Please see the history of present illness.   Otherwise, review of systems is positive for none.   All other systems are reviewed and negative.   PHYSICAL EXAM: VS:  BP (!) 160/78   Pulse 62   Ht 5' 5"$  (1.651 m)   Wt 134 lb (60.8 kg)   SpO2 97%   BMI 22.30 kg/m  , BMI Body mass index is 22.3 kg/m. GEN: Well nourished, well developed, in no acute distress  HEENT: normal  Neck: no JVD, carotid bruits, or masses Cardiac: RRR; no murmurs, rubs, or gallops,no edema  Respiratory:   clear to auscultation bilaterally, normal work of breathing GI: soft, nontender, nondistended, + BS MS: no deformity or atrophy  Skin: warm and dry Neuro:  Strength and sensation are intact Psych: euthymic mood, full affect  EKG:  EKG is ordered today. Personal review of the ekg ordered shows sinus rhythm   Recent Labs: 01/17/2022: ALT 12; BUN 26; Creat 0.99; Hemoglobin 14.3; Platelets 291; Potassium 4.2; Sodium 141    Lipid Panel     Component Value Date/Time  CHOL 96 03/27/2017 0000   TRIG 75 03/27/2017 0000   HDL 57 03/27/2017 0000   CHOLHDL 1.8 08/11/2013 0934   VLDL 12 08/11/2013 0934   LDLCALC 28 03/27/2017 0000   LDLDIRECT 38 04/03/2018 0853     Wt Readings from Last 3 Encounters:  04/03/22 134 lb (60.8 kg)  02/15/22 131 lb (59.4 kg)  01/17/22 132 lb (59.9 kg)      Other studies Reviewed: Additional studies/ records that were reviewed today include: TTE 01/08/19  Review of the above records today demonstrates:   1. Left ventricular ejection fraction, by visual estimation, is 40 to 45%. The left ventricle has moderately decreased function. There is global hypokinesis of the left ventricle. Left ventricular septal wall thickness was mildly increased. Mildly  increased left ventricular posterior wall thickness. There is mildly increased left ventricular hypertrophy.  2. Left ventricular diastolic parameters are indeterminate.  3. Global right ventricular systolic function is normal visually.The right ventricular size is normal. No increase in right ventricular wall thickness.  4. Left atrial size was severely dilated.  5. Right atrial size was severely dilated.  6. The mitral valve is normal in structure. Mild to moderate mitral valve regurgitation. No evidence of mitral stenosis.  7. The tricuspid valve is normal in structure. Tricuspid valve regurgitation moderate.  8. The aortic valve is normal in structure. Aortic valve regurgitation is not visualized. No evidence  of aortic valve sclerosis or stenosis.  9. The pulmonic valve was not well visualized. Pulmonic valve regurgitation is not visualized. 10. Trivial circumferential pericardial effusion is present. 11. Mildly elevated pulmonary artery systolic pressure.   ASSESSMENT AND PLAN:  1.  Persistent atrial fibrillation: Currently on Toprol-XL and Eliquis.  CHA2DS2-VASc of 5.  She feels that she is having more frequent episodes of atrial fibrillation.  She felt palpitations while in the room but her pulse was regular.  Allesandra Huebsch have her wear a 2-week monitor.  2.  Chronic systolic heart failure: Ejection fraction 40 to 45%.  Potentially due to a tachycardia mediated cardiomyopathy.  Carmen Cooper repeat her echo to ensure that her ejection fraction has normalized.  3.  PVCs: Burden of less than 1%.  Continue to monitor.  4.  Secondary hypercoagulable state: Currently on Eliquis for atrial fibrillation as above   Current medicines are reviewed at length with the patient today.   The patient does not have concerns regarding her medicines.  The following changes were made today: none  Labs/ tests ordered today include:  Orders Placed This Encounter  Procedures   LONG TERM MONITOR (3-14 DAYS)   EKG 12-Lead   ECHOCARDIOGRAM COMPLETE    Disposition:   FU 6 months  Signed, Emerita Berkemeier Meredith Leeds, MD  04/03/2022 4:21 PM     [address] [place] [address] [phone] (office) [phone] (fax)

## 2022-04-03 NOTE — Patient Instructions (Addendum)
Medication Instructions:  Your physician recommends that you continue on your current medications as directed. Please refer to the Current Medication list given to you today.  *If you need a refill on your cardiac medications before your next appointment, please call your pharmacy*   Lab Work: None ordered If you have labs (blood work) drawn today and your tests are completely normal, you will receive your results only by: [place] (if you have MyChart) OR A paper copy in the mail If you have any lab test that is abnormal or we need to change your treatment, we will call you to review the results.   Testing/Procedures: Your physician has requested that you have an echocardiogram. Echocardiography is a painless test that uses sound waves to create images of your heart. It provides your doctor with information about the size and shape of your heart and how well your heart's chambers and valves are working. This procedure takes approximately one hour. There are no restrictions for this procedure. Please do NOT wear cologne, perfume, aftershave, or lotions (deodorant is allowed). Please arrive 15 minutes prior to your appointment time.                             ZIO XT- Long Term Monitor Instructions  Your physician has requested you wear a ZIO patch monitor for 14 days.  This is a single patch monitor. [employer] supplies one patch monitor per enrollment. Additional stickers are not available. Please do not apply patch if you will be having a Nuclear Stress Test,  Echocardiogram, Cardiac CT, MRI, or Chest Xray during the period you would be wearing the  monitor. The patch cannot be worn during these tests. You cannot remove and re-apply the  ZIO XT patch monitor.  Your ZIO patch monitor will be mailed 3 day USPS to your address on file. It may take 3-5 days  to receive your monitor after you have been enrolled.  Once you have received your monitor, please review the enclosed  instructions. Your monitor  has already been registered assigning a specific monitor serial # to you.  Billing and Patient Assistance Program Information  We have supplied [employer] with any of your insurance information on file for billing purposes. [employer] offers a sliding scale Patient Assistance Program for patients that do not have  insurance, or whose insurance does not completely cover the cost of the ZIO monitor.  You must apply for the Patient Assistance Program to qualify for this discounted rate.  To apply, please call [employer] at [phone], select option 4, select option 2, ask to apply for  Patient Assistance Program. Theodore Demark will ask your household income, and how many people  are in your household. They will quote your out-of-pocket cost based on that information.  [employer] will also be able to set up a 65-month interest-free payment plan if needed.  Applying the monitor   Shave hair from upper left chest.  Hold abrader disc by orange tab. Rub abrader in 40 strokes over the upper left chest as  indicated in your monitor instructions.  Clean area with 4 enclosed alcohol pads. Let dry.  Apply patch as indicated in monitor instructions. Patch will be placed under collarbone on left  side of chest with arrow pointing upward.  Rub patch adhesive wings for 2 minutes. Remove white label marked "1". Remove the white  label marked "2". Rub patch adhesive wings for 2 additional minutes.  While  looking in a mirror, press and release button in center of patch. A small green light will  flash 3-4 times. This will be your only indicator that the monitor has been turned on.  Do not shower for the first 24 hours. You may shower after the first 24 hours.  Press the button if you feel a symptom. You will hear a small click. Record Date, Time and  Symptom in the Patient Logbook.  When you are ready to remove the patch, follow instructions on the last 2 pages of Patient  Logbook. Stick patch  monitor onto the last page of Patient Logbook.  Place Patient Logbook in the blue and white box. Use locking tab on box and tape box closed  securely. The blue and white box has prepaid postage on it. Please place it in the mailbox as  soon as possible. Your physician should have your test results approximately 7 days after the  monitor has been mailed back to [facility].  Call [place] at [phone] if you have questions regarding  your ZIO XT patch monitor. Call them immediately if you see an orange light blinking on your  monitor.  If your monitor falls off in less than 4 days, contact our Monitor department at [phone].  If your monitor becomes loose or falls off after 4 days call [employer] at [phone] for  suggestions on securing your monitor    Follow-Up: At [facility], you and your health needs are our priority.  As part of our continuing mission to provide you with exceptional heart care, we have created designated Provider Care Teams.  These Care Teams include your primary Cardiologist (physician) and Advanced Practice Providers (APPs -  Physician Assistants and Nurse Practitioners) who all work together to provide you with the care you need, when you need it.   Your next appointment:   6 month(s)  The format for your next appointment:   In Person  Provider:   Allegra Lai, MD    Thank you for choosing [facility]!!   Trinidad Curet, RN [phone]  Other Instructions

## 2022-04-07 DIAGNOSIS — I4819 Other persistent atrial fibrillation: Secondary | ICD-10-CM | POA: Diagnosis not present

## 2022-04-20 ENCOUNTER — Ambulatory Visit (HOSPITAL_BASED_OUTPATIENT_CLINIC_OR_DEPARTMENT_OTHER)
Admission: RE | Admit: 2022-04-20 | Discharge: 2022-04-20 | Disposition: A | Discharge status: Home / Self Care | Payer: Medicare Other | Source: Ambulatory Visit | Attending: Cardiology | Admitting: Cardiology

## 2022-04-20 DIAGNOSIS — I4819 Other persistent atrial fibrillation: Secondary | ICD-10-CM | POA: Diagnosis not present

## 2022-04-20 DIAGNOSIS — R0609 Other forms of dyspnea: Secondary | ICD-10-CM | POA: Diagnosis not present

## 2022-04-20 LAB — ECHOCARDIOGRAM COMPLETE
AR max vel: 1.65 cm2
AV Area VTI: 1.72 cm2
AV Area mean vel: 1.69 cm2
AV Mean grad: 7 mmHg
AV Peak grad: 14.6 mmHg
Ao pk vel: 1.91 m/s
Area-P 1/2: 2.54 cm2
S' Lateral: 2.3 cm
Single Plane A4C EF: 61.9 %

## 2022-04-28 DIAGNOSIS — I4819 Other persistent atrial fibrillation: Secondary | ICD-10-CM | POA: Diagnosis not present

## 2022-05-01 DIAGNOSIS — E1065 Type 1 diabetes mellitus with hyperglycemia: Secondary | ICD-10-CM | POA: Diagnosis not present

## 2022-05-02 DIAGNOSIS — Z794 Long term (current) use of insulin: Secondary | ICD-10-CM | POA: Diagnosis not present

## 2022-05-02 DIAGNOSIS — Z87891 Personal history of nicotine dependence: Secondary | ICD-10-CM | POA: Diagnosis not present

## 2022-05-02 DIAGNOSIS — Z79899 Other long term (current) drug therapy: Secondary | ICD-10-CM | POA: Diagnosis not present

## 2022-05-02 DIAGNOSIS — N2889 Other specified disorders of kidney and ureter: Secondary | ICD-10-CM | POA: Diagnosis not present

## 2022-05-02 DIAGNOSIS — I4891 Unspecified atrial fibrillation: Secondary | ICD-10-CM | POA: Diagnosis not present

## 2022-05-02 DIAGNOSIS — E785 Hyperlipidemia, unspecified: Secondary | ICD-10-CM | POA: Diagnosis not present

## 2022-05-02 DIAGNOSIS — K5730 Diverticulosis of large intestine without perforation or abscess without bleeding: Secondary | ICD-10-CM | POA: Diagnosis not present

## 2022-05-02 DIAGNOSIS — I714 Abdominal aortic aneurysm, without rupture, unspecified: Secondary | ICD-10-CM | POA: Diagnosis not present

## 2022-05-02 DIAGNOSIS — R197 Diarrhea, unspecified: Secondary | ICD-10-CM | POA: Diagnosis not present

## 2022-05-02 DIAGNOSIS — E109 Type 1 diabetes mellitus without complications: Secondary | ICD-10-CM | POA: Diagnosis not present

## 2022-05-02 DIAGNOSIS — I5023 Acute on chronic systolic (congestive) heart failure: Secondary | ICD-10-CM | POA: Diagnosis not present

## 2022-05-02 DIAGNOSIS — R1084 Generalized abdominal pain: Secondary | ICD-10-CM | POA: Diagnosis not present

## 2022-05-02 DIAGNOSIS — E039 Hypothyroidism, unspecified: Secondary | ICD-10-CM | POA: Diagnosis not present

## 2022-05-02 DIAGNOSIS — R1013 Epigastric pain: Secondary | ICD-10-CM | POA: Diagnosis not present

## 2022-05-02 DIAGNOSIS — I110 Hypertensive heart disease with heart failure: Secondary | ICD-10-CM | POA: Diagnosis not present

## 2022-05-03 ENCOUNTER — Telehealth: Payer: Self-pay | Admitting: *Deleted

## 2022-05-03 MED ORDER — METOPROLOL SUCCINATE ER 50 MG PO TB24: 50.0000 mg | ORAL_TABLET | Freq: Every day | ORAL | 3 refills | Status: DC

## 2022-05-03 NOTE — Telephone Encounter (Signed)
-----   Message from Will Meredith Leeds, MD sent at 05/02/2022  7:33 AM EDT ----- Multiple SVT episodes. Start toprol xl 50 mg

## 2022-05-03 NOTE — Telephone Encounter (Signed)
Called pt and informed of monitor findings. Patient verbalized understanding and agreeable to plan.

## 2022-05-11 ENCOUNTER — Encounter: Payer: Self-pay | Admitting: Family Medicine

## 2022-05-11 ENCOUNTER — Ambulatory Visit (INDEPENDENT_AMBULATORY_CARE_PROVIDER_SITE_OTHER): Payer: Medicare Other | Admitting: Family Medicine

## 2022-05-11 VITALS — BP 166/71 | HR 55 | Ht 65.0 in | Wt 136.0 lb

## 2022-05-11 DIAGNOSIS — H3581 Retinal edema: Secondary | ICD-10-CM | POA: Diagnosis not present

## 2022-05-11 DIAGNOSIS — R110 Nausea: Secondary | ICD-10-CM

## 2022-05-11 DIAGNOSIS — Z79899 Other long term (current) drug therapy: Secondary | ICD-10-CM | POA: Diagnosis not present

## 2022-05-11 DIAGNOSIS — H353212 Exudative age-related macular degeneration, right eye, with inactive choroidal neovascularization: Secondary | ICD-10-CM | POA: Diagnosis not present

## 2022-05-11 DIAGNOSIS — H44111 Panuveitis, right eye: Secondary | ICD-10-CM | POA: Diagnosis not present

## 2022-05-11 DIAGNOSIS — H4041X1 Glaucoma secondary to eye inflammation, right eye, mild stage: Secondary | ICD-10-CM | POA: Diagnosis not present

## 2022-05-11 DIAGNOSIS — Z961 Presence of intraocular lens: Secondary | ICD-10-CM | POA: Diagnosis not present

## 2022-05-11 DIAGNOSIS — R1319 Other dysphagia: Secondary | ICD-10-CM

## 2022-05-11 DIAGNOSIS — H30031 Focal chorioretinal inflammation, peripheral, right eye: Secondary | ICD-10-CM | POA: Diagnosis not present

## 2022-05-11 DIAGNOSIS — H209 Unspecified iridocyclitis: Secondary | ICD-10-CM | POA: Diagnosis not present

## 2022-05-11 DIAGNOSIS — H401120 Primary open-angle glaucoma, left eye, stage unspecified: Secondary | ICD-10-CM | POA: Diagnosis not present

## 2022-05-11 DIAGNOSIS — H353221 Exudative age-related macular degeneration, left eye, with active choroidal neovascularization: Secondary | ICD-10-CM | POA: Diagnosis not present

## 2022-05-11 NOTE — Assessment & Plan Note (Signed)
She is having increasing dysphagia with nausea.  Unclear if this is true esophageal dysphagia versus gastroparesis from her diabetes.  Referral placed to GI for further evaluation.

## 2022-05-11 NOTE — Progress Notes (Signed)
[name] - 82 y.o. female MRN [id number]  Date of birth: 1940-10-18  Subjective Chief Complaint  Patient presents with   Hospitalization Follow-up    HPI Carmen Cooper is a 82 y.o. female here today for follow up of recent ED visit.  She was seen in the ED with complaint of epigastric abdominal pain.  She did have some nausea and diarrhea but no vomiting.  Labs were unremarkable.  CT scan with diverticulosis.  Stable thoracic aortic aneurysm noted.  Bentyl and loperamide added.  Referred to vascular surgery for TAA.  She report that she continues to have epigastric pain.  Feels like food gets stuck in her esophagus.  She has this with both solids and liquids.  Feels like liquids come back up at times well.  She does have some bloating with this.  No further diarrhea, now with more constipation.   ROS:  A comprehensive ROS was completed and negative except as noted per HPI    Allergies  Allergen Reactions   Dexamethasone Anaphylaxis and Other (See Comments)    Blood sugar elevated     Brimonidine Tartrate Other (See Comments)    Burning and redness    Clindamycin/Lincomycin Rash   Sulfa Antibiotics Rash   Valacyclovir Hcl Rash    Past Medical History:  Diagnosis Date   Atrial fibrillation ([place])    BCC (basal cell carcinoma of skin)    Diabetes ([place])    Glaucoma    History of TIA (transient ischemic attack) 08/11/2013   12/2012 - [name]    Hypertension    Hypothyroidism 08/11/2013   Memory changes    Microscopic colitis 08/21/2013   2008 - [facility] [name].  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, [facility]     Past Surgical History:  Procedure Laterality Date   BREAST EXCISIONAL BIOPSY Left    BREAST EXCISIONAL BIOPSY Left    CARDIOVERSION N/A 02/12/2019   Procedure: CARDIOVERSION;  Surgeon: [name], MD;  Location: [facility];  Service: Cardiovascular;   Laterality: N/A;   MOHS SURGERY  2019   Nose bcc    OTHER SURGICAL HISTORY  04/01/2019   biopsy on nose and lip     Social History   Socioeconomic History   Marital status: Widowed    Spouse name: [name]   Number of children: 1   Years of education: 12   Highest education level: 12th grade  Occupational History   Occupation: Retired    Comment: retired  Tobacco Use   Smoking status: Former    Packs/day: 0.25    Years: 20.00    Additional pack years: 0.00    Total pack years: 5.00    Types: Cigarettes   Smokeless tobacco: Never  Vaping Use   Vaping Use: Never used  Substance and Sexual Activity   Alcohol use: Not Currently   Drug use: No   Sexual activity: Not Currently    Partners: Male  Other Topics Concern   Not on file  Social History Narrative   Lives alone. She has one son, who takes her to her doctor's appointments. She enjoys crochet and gardening.   Social Determinants of Health   Financial Resource Strain: Low Risk  (03/17/2022)   Overall Financial Resource Strain (CARDIA)    Difficulty of Paying Living Expenses: Not hard at all  Food Insecurity: No Food Insecurity (03/17/2022)  Hunger Vital Sign    Worried About Running Out of Food in the Last Year: Never true    Ran Out of Food in the Last Year: Never true  Transportation Needs: No Transportation Needs (03/17/2022)   PRAPARE - Hydrologist (Medical): No    Lack of Transportation (Non-Medical): No  Physical Activity: Sufficiently Active (03/17/2022)   Exercise Vital Sign    Days of Exercise per Week: 4 days    Minutes of Exercise per Session: 40 min  Stress: No Stress Concern Present (03/17/2022)   [place]    Feeling of Stress : Not at all  Social Connections: Socially Isolated (03/17/2022)   Social Connection and Isolation Panel [NHANES]    Frequency of Communication with Friends and Family: More than three times  a week    Frequency of Social Gatherings with Friends and Family: More than three times a week    Attends Religious Services: Never    Marine scientist or Organizations: No    Attends Archivist Meetings: Never    Marital Status: Widowed    Family History  Problem Relation Age of Onset   Heart disease Son    Hypertension Mother    Cancer Mother        unsure of origin   Heart attack Father    Diabetes Neg Hx     Health Maintenance  Topic Date Due   INFLUENZA VACCINE  05/14/2022 (Originally 09/13/2021)   Diabetic kidney evaluation - Urine ACR  11/11/2022 (Originally 02/11/2015)   FOOT EXAM  11/11/2022 (Originally 03/09/2022)   OPHTHALMOLOGY EXAM  11/11/2022 (Originally 07/21/2021)   COVID-19 Vaccine (7 - 2023-24 season) 11/11/2022 (Originally 01/10/2022)   Zoster Vaccines- Shingrix (2 of 2) 11/11/2022 (Originally 05/21/2021)   HEMOGLOBIN A1C  08/25/2022   Diabetic kidney evaluation - eGFR measurement  01/18/2023   Medicare Annual Wellness (AWV)  03/18/2023   DTaP/Tdap/Td (3 - Td or Tdap) 06/26/2031   Pneumonia Vaccine 8+ Years old  Completed   DEXA SCAN  Completed   HPV VACCINES  Aged Out     ----------------------------------------------------------------------------------------------------------------------------------------------------------------------------------------------------------------- Physical Exam BP (!) 166/71 (BP Location: Left Arm, Patient Position: Sitting, Cuff Size: Normal)   Pulse (!) 55   Ht 5\' 5"  (1.651 m)   Wt 136 lb (61.7 kg)   SpO2 100%   BMI 22.63 kg/m   Physical Exam Constitutional:      Appearance: Normal appearance.  HENT:     Head: Normocephalic and atraumatic.  Cardiovascular:     Rate and Rhythm: Normal rate and regular rhythm.  Pulmonary:     Effort: Pulmonary effort is normal.     Breath sounds: Normal breath sounds.  Neurological:     Mental Status: She is alert.      ------------------------------------------------------------------------------------------------------------------------------------------------------------------------------------------------------------------- Assessment and Plan  Dysphagia She is having increasing dysphagia with nausea.  Unclear if this is true esophageal dysphagia versus gastroparesis from her diabetes.  Referral placed to GI for further evaluation.   No orders of the defined types were placed in this encounter.   No follow-ups on file.    This visit occurred during the SARS-CoV-2 public health emergency.  Safety protocols were in place, including screening questions prior to the visit, additional usage of staff PPE, and extensive cleaning of exam room while observing appropriate contact time as indicated for disinfecting solutions.

## 2022-05-23 DIAGNOSIS — H209 Unspecified iridocyclitis: Secondary | ICD-10-CM | POA: Diagnosis not present

## 2022-05-23 DIAGNOSIS — Z79899 Other long term (current) drug therapy: Secondary | ICD-10-CM | POA: Diagnosis not present

## 2022-05-23 DIAGNOSIS — H401120 Primary open-angle glaucoma, left eye, stage unspecified: Secondary | ICD-10-CM | POA: Diagnosis not present

## 2022-05-23 DIAGNOSIS — H30031 Focal chorioretinal inflammation, peripheral, right eye: Secondary | ICD-10-CM | POA: Diagnosis not present

## 2022-05-23 DIAGNOSIS — H3581 Retinal edema: Secondary | ICD-10-CM | POA: Diagnosis not present

## 2022-05-23 DIAGNOSIS — H4041X1 Glaucoma secondary to eye inflammation, right eye, mild stage: Secondary | ICD-10-CM | POA: Diagnosis not present

## 2022-05-24 ENCOUNTER — Telehealth: Payer: Self-pay | Admitting: *Deleted

## 2022-05-24 DIAGNOSIS — R1310 Dysphagia, unspecified: Secondary | ICD-10-CM | POA: Diagnosis not present

## 2022-05-24 DIAGNOSIS — R198 Other specified symptoms and signs involving the digestive system and abdomen: Secondary | ICD-10-CM | POA: Diagnosis not present

## 2022-05-24 DIAGNOSIS — R1013 Epigastric pain: Secondary | ICD-10-CM | POA: Diagnosis not present

## 2022-05-24 NOTE — Telephone Encounter (Signed)
   Pre-operative Risk Assessment    Patient Name: Carmen Cooper  DOB: 1941/01/29 MRN: [id number]      Request for Surgical Clearance    Procedure:   ENDOSCOPY  Date of Surgery:  Clearance 07/06/22                                 Surgeon:  NOT LISTED  Surgeon's Group or Practice Name:  [facility] Phone number:  [phone] Fax number:  [phone]   Type of Clearance Requested:   - Medical  - Pharmacy:  Hold Apixaban (Eliquis) x 2 DAYS PRIOR   Type of Anesthesia:  Not Indicated; PROPOFOL?    Additional requests/questions:    Elpidio Anis   05/24/2022, 5:37 PM

## 2022-05-25 NOTE — Telephone Encounter (Signed)
   Name: Deidre Gregor  DOB: 12-29-1940  MRN: [id number]   Primary Cardiologist: Regan Lemming, MD  Chart reviewed as part of pre-operative protocol coverage. Mickel Hamideh was last seen on 04/03/2022 by Dr. Elberta Fortis.  Per correspondence with Dr. Elberta Fortis: "Patient has no major issues and is able to exert herself.  Can reach up to 4 METS.  Would be at intermediate risk for intermediate risk procedure.  No further cardiac testing is necessary."  Therefore, based on ACC/AHA guidelines, the patient would be at acceptable risk for the planned procedure without further cardiovascular testing.   Per pharm D: Patient with diagnosis of afib on Eliquis for anticoagulation.     Procedure: endoscopy Date of procedure: 07/06/22   CHA2DS2-VASc Score = 9  This indicates a 12.2% annual risk of stroke. The patient's score is based upon: CHF History: 1 HTN History: 1 Diabetes History: 1 Stroke History: 2 Vascular Disease History: 1 Age Score: 2 Gender Score: 1   CrCl 29mL/min Platelet count 181K   Request is to hold Eliquis for 2 days prior to procedure. Pt is at elevated CV risk, would prefer to limit time off anticoag as much as possible. Would recommend 1 day Eliquis hold prior to procedure if they are ok with it.  I will route this recommendation to the requesting party via Epic fax function and remove from pre-op pool. Please call with questions.  Carlos Levering, NP 05/25/2022, 2:23 PM

## 2022-05-25 NOTE — Telephone Encounter (Signed)
Patient with diagnosis of afib on Eliquis for anticoagulation.    Procedure: endoscopy Date of procedure: 07/06/22  CHA2DS2-VASc Score = 9  This indicates a 12.2% annual risk of stroke. The patient's score is based upon: CHF History: 1 HTN History: 1 Diabetes History: 1 Stroke History: 2 Vascular Disease History: 1 Age Score: 2 Gender Score: 1   CrCl 52mL/min Platelet count 181K  Request is to hold Eliquis for 2 days prior to procedure. Pt is at elevated CV risk, would prefer to limit time off anticoag as much as possible. Would recommend 1 day Eliquis hold prior to procedure if they are ok with it.  **This guidance is not considered finalized until pre-operative APP has relayed final recommendations.**

## 2022-05-25 NOTE — Telephone Encounter (Signed)
Dr. Elberta Fortis,   You saw this patient on 04/03/2022. Will you please comment on clearance for endoscopy on 07/06/2022?  Please route your response to P CV DIV Preop. I will communicate with requesting office once you have given recommendations.   Thank you!  Carlos Levering, NP

## 2022-06-08 DIAGNOSIS — R0789 Other chest pain: Secondary | ICD-10-CM | POA: Diagnosis not present

## 2022-06-08 DIAGNOSIS — T85694A Other mechanical complication of insulin pump, initial encounter: Secondary | ICD-10-CM | POA: Diagnosis not present

## 2022-06-08 DIAGNOSIS — R079 Chest pain, unspecified: Secondary | ICD-10-CM | POA: Diagnosis not present

## 2022-06-08 DIAGNOSIS — E1100 Type 2 diabetes mellitus with hyperosmolarity without nonketotic hyperglycemic-hyperosmolar coma (NKHHC): Secondary | ICD-10-CM | POA: Diagnosis not present

## 2022-06-08 DIAGNOSIS — H409 Unspecified glaucoma: Secondary | ICD-10-CM | POA: Diagnosis not present

## 2022-06-08 DIAGNOSIS — Z881 Allergy status to other antibiotic agents status: Secondary | ICD-10-CM | POA: Diagnosis not present

## 2022-06-08 DIAGNOSIS — E785 Hyperlipidemia, unspecified: Secondary | ICD-10-CM | POA: Diagnosis not present

## 2022-06-08 DIAGNOSIS — Z66 Do not resuscitate: Secondary | ICD-10-CM | POA: Diagnosis not present

## 2022-06-08 DIAGNOSIS — E10649 Type 1 diabetes mellitus with hypoglycemia without coma: Secondary | ICD-10-CM | POA: Diagnosis not present

## 2022-06-08 DIAGNOSIS — E871 Hypo-osmolality and hyponatremia: Secondary | ICD-10-CM | POA: Diagnosis not present

## 2022-06-08 DIAGNOSIS — T383X1A Poisoning by insulin and oral hypoglycemic [antidiabetic] drugs, accidental (unintentional), initial encounter: Secondary | ICD-10-CM | POA: Diagnosis not present

## 2022-06-08 DIAGNOSIS — Z79899 Other long term (current) drug therapy: Secondary | ICD-10-CM | POA: Diagnosis not present

## 2022-06-08 DIAGNOSIS — Z1152 Encounter for screening for COVID-19: Secondary | ICD-10-CM | POA: Diagnosis not present

## 2022-06-08 DIAGNOSIS — H5461 Unqualified visual loss, right eye, normal vision left eye: Secondary | ICD-10-CM | POA: Diagnosis not present

## 2022-06-08 DIAGNOSIS — R531 Weakness: Secondary | ICD-10-CM | POA: Diagnosis not present

## 2022-06-08 DIAGNOSIS — F439 Reaction to severe stress, unspecified: Secondary | ICD-10-CM | POA: Diagnosis not present

## 2022-06-08 DIAGNOSIS — E1165 Type 2 diabetes mellitus with hyperglycemia: Secondary | ICD-10-CM | POA: Diagnosis not present

## 2022-06-08 DIAGNOSIS — I110 Hypertensive heart disease with heart failure: Secondary | ICD-10-CM | POA: Diagnosis not present

## 2022-06-08 DIAGNOSIS — J029 Acute pharyngitis, unspecified: Secondary | ICD-10-CM | POA: Diagnosis not present

## 2022-06-08 DIAGNOSIS — E782 Mixed hyperlipidemia: Secondary | ICD-10-CM | POA: Diagnosis not present

## 2022-06-08 DIAGNOSIS — Z7901 Long term (current) use of anticoagulants: Secondary | ICD-10-CM | POA: Diagnosis not present

## 2022-06-08 DIAGNOSIS — I480 Paroxysmal atrial fibrillation: Secondary | ICD-10-CM | POA: Diagnosis not present

## 2022-06-08 DIAGNOSIS — E039 Hypothyroidism, unspecified: Secondary | ICD-10-CM | POA: Diagnosis not present

## 2022-06-08 DIAGNOSIS — Z87891 Personal history of nicotine dependence: Secondary | ICD-10-CM | POA: Diagnosis not present

## 2022-06-08 DIAGNOSIS — Z888 Allergy status to other drugs, medicaments and biological substances status: Secondary | ICD-10-CM | POA: Diagnosis not present

## 2022-06-08 DIAGNOSIS — Z8673 Personal history of transient ischemic attack (TIA), and cerebral infarction without residual deficits: Secondary | ICD-10-CM | POA: Diagnosis not present

## 2022-06-08 DIAGNOSIS — I5022 Chronic systolic (congestive) heart failure: Secondary | ICD-10-CM | POA: Diagnosis not present

## 2022-06-08 DIAGNOSIS — I4891 Unspecified atrial fibrillation: Secondary | ICD-10-CM | POA: Diagnosis not present

## 2022-06-08 DIAGNOSIS — I10 Essential (primary) hypertension: Secondary | ICD-10-CM | POA: Diagnosis not present

## 2022-06-08 DIAGNOSIS — N178 Other acute kidney failure: Secondary | ICD-10-CM | POA: Diagnosis not present

## 2022-06-08 DIAGNOSIS — E1069 Type 1 diabetes mellitus with other specified complication: Secondary | ICD-10-CM | POA: Diagnosis not present

## 2022-06-12 ENCOUNTER — Telehealth: Payer: Self-pay | Admitting: *Deleted

## 2022-06-12 ENCOUNTER — Encounter: Payer: Self-pay | Admitting: *Deleted

## 2022-06-12 NOTE — Transitions of Care (Post Inpatient/ED Visit) (Signed)
   06/12/2022  Name: Carmen Cooper MRN: [id number] DOB: June 13, 1940  Today's TOC FU Call Status: Today's TOC FU Call Status:: Successful TOC FU Call Competed TOC FU Call Complete Date: 06/12/22  Transition Care Management Follow-up Telephone Call Date of Discharge: 06/09/22 Discharge Facility: Other ([facility]) Name of Other ([facility]) Discharge Facility: [facility] Type of Discharge: Inpatient Admission Primary Inpatient Discharge Diagnosis:: hyperglycemia How have you been since you were released from the hospital?: Better ("I am doing fine; have gone back to my normal activities, not havihng any problems.  My son will call the scheduler back to make the appointment with my PCP") Any questions or concerns?: No  Items Reviewed: Did you receive and understand the discharge instructions provided?: Yes (briefly reviewed with patient who verbalizes good understanding of same-- outside hospitalization) Medications obtained and verified?: Yes (Medications Reviewed) (Partial medication reconciliation/ review completed; pt. declined full review; confirmed patient obtained/ is taking all newly Rx'd medications as instructed; self-manages medications and denies questions/ concerns around medications today) Any new allergies since your discharge?: No Dietary orders reviewed?: Yes Type of Diet Ordered:: "As healthy as possible" Do you have support at home?: Yes People in Home: alone Name of Support/Comfort Primary Source: patient reports she lives alone and is independent in all self-care activities- local/ supportive son checks on her daily and assists with transportation/ care needs as/ if indictaed- needed  Home Care and Equipment/Supplies: Were Home Health Services Ordered?: No Any new equipment or medical supplies ordered?: No  Functional Questionnaire: Do you need assistance with bathing/showering or dressing?: No Do you need assistance with meal preparation?: No Do  you need assistance with eating?: No Do you have difficulty maintaining continence: No Do you need assistance with getting out of bed/getting out of a chair/moving?: No Do you have difficulty managing or taking your medications?: No  Follow up appointments reviewed: PCP Follow-up appointment confirmed?: No (care coordination outreach in real-time with scheduling care guide to schedule hospital follow up PCP appointment- patient reports her son will need to schedule; unable to facilitate- scheduling care guide confirms she reached out to son as requested) MD Provider Line Number:[phone] Given: No (N/A: confirmed well-established with PCP) Specialist Hospital Follow-up appointment confirmed?: NA Do you need transportation to your follow-up appointment?: No Do you understand care options if your condition(s) worsen?: Yes-patient verbalized understanding  SDOH Interventions Today    Flowsheet Row Most Recent Value  SDOH Interventions   Food Insecurity Interventions Intervention Not Indicated  Housing Interventions Intervention Not Indicated  [reports lives alone in town home]  Transportation Interventions Intervention Not Indicated  [friends/ son provides transportation]      TOC Interventions Today    Flowsheet Row Most Recent Value  TOC Interventions   TOC Interventions Discussed/Reviewed TOC Interventions Discussed      Interventions Today    Flowsheet Row Most Recent Value  Chronic Disease   Chronic disease during today's visit Diabetes  General Interventions   General Interventions Discussed/Reviewed General Interventions Discussed  Education Interventions   Education Provided Provided Education  Provided Verbal Education On When to see the doctor, Other  [need to promptly schedule PCP appointment for hospital follow up]  Nutrition Interventions   Nutrition Discussed/Reviewed Nutrition Discussed  Pharmacy Interventions   Pharmacy Dicussed/Reviewed Pharmacy Topics  Discussed      Caryl Pina, RN, BSN, CCRN Alumnus RN CM Care Coordination/ Transition of Care- [facility] Care Management [phone]: direct office

## 2022-06-12 NOTE — Progress Notes (Unsigned)
  Care Coordination  Note  06/12/2022 Name: [name] MRN: [id number] DOB: May 08, 1940  Conny Situ is a 82 y.o. year old primary care patient of Everrett Coombe, DO. I reached out to Vangie Bicker by phone today to assist with scheduling a follow up appointment. Vangie Bicker  for hospital follow up.       Follow up plan: Unsuccessful telephone outreach attempt made. A HIPAA compliant phone message was left for the patient providing contact information and requesting a return call.   [facility]  Care Coordination Care Guide  Direct Dial: [phone]

## 2022-06-14 NOTE — Progress Notes (Unsigned)
  Care Coordination  Note  06/14/2022 Name: Nour Rodrigues MRN: [id number] DOB: 1940/09/04  Vadis Slabach is a 82 y.o. year old primary care patient of Everrett Coombe, DO. I reached out to Vangie Bicker by phone today to assist with scheduling a follow up appointment.   Follow up plan: Unsuccessful telephone outreach attempt made.   [facility]  Care Coordination Care Guide  Direct Dial: [phone]

## 2022-06-15 NOTE — Progress Notes (Signed)
  Care Coordination  Note  06/15/2022 Name: Kristien Salatino MRN: [id number] DOB: [birth year]  Verania Salberg is a 82 y.o. year old primary care patient of Everrett Coombe, DO. I reached out to Vangie Bicker by phone today to assist with scheduling a follow up appointment. Faviola Klare verbally consented to my assistance.       Follow up plan: Hospital Follow Up appointment scheduled with Ashley Royalty, Selena Batten, DO) on (06/22/22 at (3:30 PM).  [facility]  Care Coordination Care Guide  Direct Dial: [phone]

## 2022-06-15 NOTE — Progress Notes (Signed)
  Care Coordination  Note  06/15/2022 Name: Treanna Dumler MRN: [id number] DOB: [birth year]  Jessilyn Catino is a 82 y.o. year old primary care patient of Everrett Coombe, DO. I reached out to Vangie Bicker by phone today to assist with scheduling a follow up appointment. Dao Mearns verbally consented to my assistance.       Follow up plan: A telephone outreach attempt made patient will have son call back to schedule hospital follow up    [facility] Guide  Direct Dial: [phone]

## 2022-06-22 ENCOUNTER — Ambulatory Visit (INDEPENDENT_AMBULATORY_CARE_PROVIDER_SITE_OTHER): Payer: Medicare Other | Admitting: Family Medicine

## 2022-06-22 ENCOUNTER — Encounter: Payer: Self-pay | Admitting: Family Medicine

## 2022-06-22 VITALS — BP 151/87 | HR 77 | Ht 65.0 in | Wt 140.0 lb

## 2022-06-22 DIAGNOSIS — I4819 Other persistent atrial fibrillation: Secondary | ICD-10-CM | POA: Diagnosis not present

## 2022-06-22 DIAGNOSIS — E1039 Type 1 diabetes mellitus with other diabetic ophthalmic complication: Secondary | ICD-10-CM

## 2022-06-22 DIAGNOSIS — E031 Congenital hypothyroidism without goiter: Secondary | ICD-10-CM

## 2022-06-22 LAB — CBC WITH DIFFERENTIAL/PLATELET
Basophils Absolute: 53 cells/uL (ref 0–200)
Eosinophils Absolute: 254 cells/uL (ref 15–500)
HCT: 41.9 % (ref 35.0–45.0)
MPV: 10.3 fL (ref 7.5–12.5)
RBC: 4.46 10*6/uL (ref 3.80–5.10)

## 2022-06-22 NOTE — Patient Instructions (Signed)
I have reached out to Dr. Irena Cords A. fib I am sending labs stat.  If symptoms worsen-Chest pain/tightnes, increasing shortness of breath, lightheaded feeling please go to the ER.

## 2022-06-23 ENCOUNTER — Encounter: Payer: Self-pay | Admitting: Family Medicine

## 2022-06-23 ENCOUNTER — Telehealth: Payer: Self-pay | Admitting: Cardiology

## 2022-06-23 DIAGNOSIS — E1049 Type 1 diabetes mellitus with other diabetic neurological complication: Secondary | ICD-10-CM | POA: Diagnosis not present

## 2022-06-23 DIAGNOSIS — Z1330 Encounter for screening examination for mental health and behavioral disorders, unspecified: Secondary | ICD-10-CM | POA: Diagnosis not present

## 2022-06-23 LAB — CBC WITH DIFFERENTIAL/PLATELET
Absolute Monocytes: 572 cells/uL (ref 200–950)
Basophils Relative: 0.9 %
Eosinophils Relative: 4.3 %
Hemoglobin: 13.7 g/dL (ref 11.7–15.5)
Lymphs Abs: 2036 cells/uL (ref 850–3900)
MCH: 30.7 pg (ref 27.0–33.0)
MCHC: 32.7 g/dL (ref 32.0–36.0)
MCV: 93.9 fL (ref 80.0–100.0)
Monocytes Relative: 9.7 %
Neutro Abs: 2985 cells/uL (ref 1500–7800)
Neutrophils Relative %: 50.6 %
Platelets: 280 10*3/uL (ref 140–400)
RDW: 13.1 % (ref 11.0–15.0)
Total Lymphocyte: 34.5 %
WBC: 5.9 10*3/uL (ref 3.8–10.8)

## 2022-06-23 LAB — TSH: TSH: 121.91 mIU/L — ABNORMAL HIGH (ref 0.40–4.50)

## 2022-06-23 LAB — BASIC METABOLIC PANEL
BUN/Creatinine Ratio: 20 (calc) (ref 6–22)
BUN: 20 mg/dL (ref 7–25)
CO2: 30 mmol/L (ref 20–32)
Calcium: 9 mg/dL (ref 8.6–10.4)
Chloride: 105 mmol/L (ref 98–110)
Creat: 1.02 mg/dL — ABNORMAL HIGH (ref 0.60–0.95)
Glucose, Bld: 58 mg/dL — ABNORMAL LOW (ref 65–99)
Potassium: 4.1 mmol/L (ref 3.5–5.3)
Sodium: 142 mmol/L (ref 135–146)

## 2022-06-23 LAB — MAGNESIUM: Magnesium: 1.9 mg/dL (ref 1.5–2.5)

## 2022-06-23 NOTE — Telephone Encounter (Signed)
Attempted to reach, unable to leave message.

## 2022-06-23 NOTE — Telephone Encounter (Signed)
Son states he is returning call. Please advise

## 2022-06-23 NOTE — Telephone Encounter (Signed)
Spoke to son. They are agreeable to seeing AFib clinic on Monday.   (OV per [place] that received note from Everrett Coombe, DO that pt was in afib yesterday)

## 2022-06-25 NOTE — Assessment & Plan Note (Signed)
She was symptomatic but not unstable at this time.  I reached out to her cardiologist who will work her in early next week for an appointment.  Hesitant to increase her diltiazem at this time due to her current heart rate.

## 2022-06-25 NOTE — Assessment & Plan Note (Signed)
Recent hyperglycemia.  Improved some since discharge from the hospital.  We discussed maintaining a consistent diet.  She has appoint with endocrinology tomorrow.

## 2022-06-25 NOTE — Progress Notes (Signed)
Carmen Cooper - 82 y.o. female MRN [id number]  Date of birth: [birth year]  Subjective Chief Complaint  Patient presents with   Hospitalization Follow-up    HPI Carmen Cooper is an 82 year old female here today for hospital follow-up.  She was recently hospitalized due to hypoglycemia.  Blood sugars greater than 500 at time of admission.  She does use an insulin pump and changed her pump twice thinking she had pump failure due to giving multiple boluses and her blood sugars remain high.  She began to have symptoms including feeling weak and glucose at time of arrival to ED was 782.  Additionally she had acute kidney injury.  Labs not suggestive of DKA.  She received aggressive IV fluids and insulin.  She was quickly transitioned off of insulin drip due to hypoglycemia.  Her insulin pump was resumed to ensure that her blood sugars correlated well with hospital readings.  She was scheduled for follow-up with her endocrinologist and primary care physician.  Today she reports that blood sugars seem to be a little better.  She does have an appointment with endocrinology tomorrow.  She is having some increased fluttering in her chest that she thinks is related to her being in A-fib.  She has felt this way when in A-fib before.  She does feel mildly fatigued.  She does not have any dyspnea, chest pain or chest tightness.  ROS:  A comprehensive ROS was completed and negative except as noted per HPI  Allergies  Allergen Reactions   Dexamethasone Anaphylaxis and Other (See Comments)    Blood sugar elevated     Brimonidine Tartrate Other (See Comments)    Burning and redness    Clindamycin/Lincomycin Rash   Sulfa Antibiotics Rash   Valacyclovir Hcl Rash    Past Medical History:  Diagnosis Date   Atrial fibrillation (HCC)    BCC (basal cell carcinoma of skin)    Diabetes (HCC)    Glaucoma    History of TIA (transient ischemic attack) 08/11/2013   12/2012 - Dr. Fara Olden    Hypertension    Hypothyroidism  08/11/2013   Memory changes    Microscopic colitis 08/21/2013   2008 - [facility] Dr. Myra Gianotti.  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 Loraine Grip, [facility]     Past Surgical History:  Procedure Laterality Date   BREAST EXCISIONAL BIOPSY Left    BREAST EXCISIONAL BIOPSY Left    CARDIOVERSION N/A 02/12/2019   Procedure: CARDIOVERSION;  Surgeon: Chrystie Nose, MD;  Location: [facility] ENDOSCOPY;  Service: Cardiovascular;  Laterality: N/A;   MOHS SURGERY  2019   Nose bcc    OTHER SURGICAL HISTORY  04/01/2019   biopsy on nose and lip     Social History   Socioeconomic History   Marital status: Widowed    Spouse name: [name]   Number of children: 1   Years of education: 12   Highest education level: 12th grade  Occupational History   Occupation: Retired    Comment: retired  Tobacco Use   Smoking status: Former    Packs/day: 0.25    Years: 20.00    Additional pack years: 0.00    Total pack years: 5.00    Types: Cigarettes   Smokeless tobacco: Never  Vaping Use   Vaping Use: Never used  Substance and Sexual Activity   Alcohol use: Not Currently   Drug use: No  Sexual activity: Not Currently    Partners: Male  Other Topics Concern   Not on file  Social History Narrative   Lives alone. She has one son, who takes her to her doctor's appointments. She enjoys crochet and gardening.   Social Determinants of Health   Financial Resource Strain: Low Risk  (03/17/2022)   Overall Financial Resource Strain (CARDIA)    Difficulty of Paying Living Expenses: Not hard at all  Food Insecurity: No Food Insecurity (06/12/2022)   Hunger Vital Sign    Worried About Running Out of Food in the Last Year: Never true    Ran Out of Food in the Last Year: Never true  Transportation Needs: No Transportation Needs (06/12/2022)   PRAPARE - Administrator, Civil Service (Medical): No    Lack of Transportation  (Non-Medical): No  Physical Activity: Sufficiently Active (03/17/2022)   Exercise Vital Sign    Days of Exercise per Week: 4 days    Minutes of Exercise per Session: 40 min  Stress: No Stress Concern Present (03/17/2022)   [employer] of Occupational Health - Occupational Stress Questionnaire    Feeling of Stress : Not at all  Social Connections: Socially Isolated (03/17/2022)   Social Connection and Isolation Panel [NHANES]    Frequency of Communication with Friends and Family: More than three times a week    Frequency of Social Gatherings with Friends and Family: More than three times a week    Attends Religious Services: Never    Database administrator or Organizations: No    Attends Banker Meetings: Never    Marital Status: Widowed    Family History  Problem Relation Age of Onset   Heart disease Son    Hypertension Mother    Cancer Mother        unsure of origin   Heart attack Father    Diabetes Neg Hx     Health Maintenance  Topic Date Due   Diabetic kidney evaluation - Urine ACR  11/11/2022 (Originally 02/11/2015)   FOOT EXAM  11/11/2022 (Originally 03/09/2022)   OPHTHALMOLOGY EXAM  11/11/2022 (Originally 07/21/2021)   COVID-19 Vaccine (7 - 2023-24 season) 11/11/2022 (Originally 01/10/2022)   Zoster Vaccines- Shingrix (2 of 2) 11/11/2022 (Originally 05/21/2021)   HEMOGLOBIN A1C  08/25/2022   INFLUENZA VACCINE  09/14/2022   Medicare Annual Wellness (AWV)  03/18/2023   Diabetic kidney evaluation - eGFR measurement  06/22/2023   DTaP/Tdap/Td (3 - Td or Tdap) 06/26/2031   Pneumonia Vaccine 30+ Years old  Completed   DEXA SCAN  Completed   HPV VACCINES  Aged Out     ----------------------------------------------------------------------------------------------------------------------------------------------------------------------------------------------------------------- Physical Exam BP (!) 151/87 (BP Location: Left Arm, Patient Position: Sitting, Cuff  Size: Normal)   Pulse 77   Ht 5\' 5"  (1.651 m)   Wt 140 lb (63.5 kg)   SpO2 98%   BMI 23.30 kg/m   Physical Exam Constitutional:      Appearance: Normal appearance.  HENT:     Head: Normocephalic and atraumatic.  Eyes:     General: No scleral icterus. Cardiovascular:     Rate and Rhythm: Normal rate. Rhythm irregular.  Pulmonary:     Effort: Pulmonary effort is normal.     Breath sounds: Normal breath sounds.  Musculoskeletal:     Cervical back: Neck supple.  Neurological:     Mental Status: She is alert.  Psychiatric:        Mood and Affect: Mood normal.  Behavior: Behavior normal.     ------------------------------------------------------------------------------------------------------------------------------------------------------------------------------------------------------------------- Assessment and Plan  Atrial fibrillation (HCC) She was symptomatic but not unstable at this time.  I reached out to her cardiologist who will work her in early next week for an appointment.  Hesitant to increase her diltiazem at this time due to her current heart rate.  Hypothyroidism Spent nearly a year since she had her thyroid checked.  This has been managed by her endocrinologist.  Updating TSH given her increased A-fib symptoms.  Type 1 diabetes (HCC) Recent hyperglycemia.  Improved some since discharge from the hospital.  We discussed maintaining a consistent diet.  She has appoint with endocrinology tomorrow.   No orders of the defined types were placed in this encounter.   No follow-ups on file.    This visit occurred during the SARS-CoV-2 public health emergency.  Safety protocols were in place, including screening questions prior to the visit, additional usage of staff PPE, and extensive cleaning of exam room while observing appropriate contact time as indicated for disinfecting solutions.

## 2022-06-25 NOTE — Assessment & Plan Note (Signed)
Spent nearly a year since she had her thyroid checked.  This has been managed by her endocrinologist.  Updating TSH given her increased A-fib symptoms.

## 2022-06-26 ENCOUNTER — Ambulatory Visit (HOSPITAL_COMMUNITY)
Admission: RE | Admit: 2022-06-26 | Discharge: 2022-06-26 | Disposition: A | Discharge status: Home / Self Care | Payer: Medicare Other | Source: Ambulatory Visit | Attending: Internal Medicine | Admitting: Internal Medicine

## 2022-06-26 VITALS — BP 154/106 | HR 91 | Ht 65.0 in | Wt 139.8 lb

## 2022-06-26 DIAGNOSIS — R0789 Other chest pain: Secondary | ICD-10-CM | POA: Diagnosis not present

## 2022-06-26 DIAGNOSIS — N189 Chronic kidney disease, unspecified: Secondary | ICD-10-CM | POA: Insufficient documentation

## 2022-06-26 DIAGNOSIS — I129 Hypertensive chronic kidney disease with stage 1 through stage 4 chronic kidney disease, or unspecified chronic kidney disease: Secondary | ICD-10-CM | POA: Insufficient documentation

## 2022-06-26 DIAGNOSIS — Z8673 Personal history of transient ischemic attack (TIA), and cerebral infarction without residual deficits: Secondary | ICD-10-CM | POA: Diagnosis not present

## 2022-06-26 DIAGNOSIS — I4819 Other persistent atrial fibrillation: Secondary | ICD-10-CM | POA: Diagnosis not present

## 2022-06-26 DIAGNOSIS — I4891 Unspecified atrial fibrillation: Secondary | ICD-10-CM

## 2022-06-26 DIAGNOSIS — D6869 Other thrombophilia: Secondary | ICD-10-CM | POA: Insufficient documentation

## 2022-06-26 DIAGNOSIS — E039 Hypothyroidism, unspecified: Secondary | ICD-10-CM | POA: Insufficient documentation

## 2022-06-26 DIAGNOSIS — E1122 Type 2 diabetes mellitus with diabetic chronic kidney disease: Secondary | ICD-10-CM | POA: Diagnosis not present

## 2022-06-26 DIAGNOSIS — Z7901 Long term (current) use of anticoagulants: Secondary | ICD-10-CM | POA: Diagnosis not present

## 2022-06-26 MED ORDER — APIXABAN 5 MG PO TABS
5.0000 mg | ORAL_TABLET | Freq: Two times a day (BID) | ORAL | 1 refills | Status: DC
Start: 2022-06-26 — End: 2022-12-06

## 2022-06-26 NOTE — Progress Notes (Signed)
Primary Care Physician: Everrett Coombe, DO Primary Cardiologist: None Primary Electrophysiologist: Dr. Elberta Fortis Referring Physician: Dr. Aldean Ast Chant is a 82 y.o. female with a history of aortic atherosclerosis, diabetes, CKD, history of TIA, hypothyroidism, and atrial fibrillation who presents for consultation in the [facility]. Seen by PCP on 5/9 for hospital f/u due to hypoglycemia and was noted to be in rate controlled Afib. He checked TSH and it was 121.91. Patient is on Eliquis 2.5 mg BID for a CHADS2VASC score of 7.  On follow up today, she is in rate controlled Afib. She is here with her daughter today. She feels very tired when in Afib. She was in Afib during outside hospitalization at end of April but daughter states they were more focused on controlling her extremely high glucose levels. She was seen by PCP on 5/9 and noted to be in Afib; subsequent lab draw showed TSH abnormal at 121.91. Daughter and patient did not yet know this lab work result. Daughter acknowledges that prior to hospitalization she has had overall infrequent episodes of Afib.   She is compliant with anticoagulation and has not missed any doses. She has no bleeding concerns.  Today, she denies symptoms of palpitations, chest pain, shortness of breath, orthopnea, PND, lower extremity edema, dizziness, presyncope, syncope, snoring, daytime somnolence, bleeding, or neurologic sequela. The patient is tolerating medications without difficulties and is otherwise without complaint today.    she has a BMI of Body mass index is 23.26 kg/m.Marland Kitchen Filed Weights   06/26/22 0842  Weight: 63.4 kg    Family History  Problem Relation Age of Onset   Heart disease Son    Hypertension Mother    Cancer Mother        unsure of origin   Heart attack Father    Diabetes Neg Hx      Atrial Fibrillation Management history:  Previous antiarrhythmic drugs: None Previous cardioversions:  unknown Previous ablations: none Anticoagulation history: Eliquis 2.5 mg BID   Past Medical History:  Diagnosis Date   Atrial fibrillation (HCC)    BCC (basal cell carcinoma of skin)    Diabetes (HCC)    Glaucoma    History of TIA (transient ischemic attack) 08/11/2013   12/2012 - Dr. Fara Olden    Hypertension    Hypothyroidism 08/11/2013   Memory changes    Microscopic colitis 08/21/2013   2008 - [facility] Dr. Myra Gianotti.  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 Loraine Grip, [facility]    Past Surgical History:  Procedure Laterality Date   BREAST EXCISIONAL BIOPSY Left    BREAST EXCISIONAL BIOPSY Left    CARDIOVERSION N/A 02/12/2019   Procedure: CARDIOVERSION;  Surgeon: Chrystie Nose, MD;  Location: [facility] ENDOSCOPY;  Service: Cardiovascular;  Laterality: N/A;   MOHS SURGERY  2019   Nose bcc    OTHER SURGICAL HISTORY  04/01/2019   biopsy on nose and lip     Current Outpatient Medications  Medication Sig Dispense Refill   acetaminophen (TYLENOL) 650 MG CR tablet Take 1 tablet (650 mg total) by mouth every 8 (eight) hours as needed for pain. 90 tablet 3   AMBULATORY NON FORMULARY MEDICATION Freestyle light test strips Test twice a day  Dx type 2 diabetes E11.9 100 each 11   apixaban (ELIQUIS) 2.5 MG TABS tablet TAKE 1 TABLET TWICE A DAY (DISCONTINUE ELIQUIS 5  MG TABLETS) 180 tablet 1   atorvastatin (LIPITOR) 40 MG tablet TAKE 1 TABLET DAILY 90 tablet 3   BAQSIMI TWO PACK 3 MG/DOSE POWD Place into both nostrils.     COSOPT PF 2-0.5 % SOLN ophthalmic solution Place 1 drop into both eyes in the morning and at bedtime.     diltiazem (CARDIZEM SR) 60 MG 12 hr capsule TAKE 1 CAPSULE TWICE A DAY 180 capsule 3   folic acid (FOLVITE) 1 MG tablet Take 1 mg by mouth daily.      FREESTYLE LITE test strip      Insulin Disposable Pump (OMNIPOD DASH 5 PACK PODS) MISC Inject into the skin as directed.     insulin  glargine (LANTUS SOLOSTAR) 100 UNIT/ML Solostar Pen Use in case of pump failure: Remove pump and take 15 units of Lantus insulin as basal insulin in case of insulin pump malfunction/failure. .Do not place pump back on until 20 hours AFTER last dose of Lantus to prevent "double basal infusion."     Insulin Human (INSULIN PUMP) SOLN Inject into the skin as directed. insulin lispro (HUMALOG) 100 UNIT/ML     insulin lispro (HUMALOG) 100 UNIT/ML injection Medtronic 630G pump.  Basal 12-6a 0.625, 6a-7p 0.725, 7p-12a 0.625.  Preset bolus:  4/5/6.  ISF 50.  Total daily dose:  40 units/day     Lifitegrast (XIIDRA) 5 % SOLN Place 1 drop into both eyes daily.      lisinopril (ZESTRIL) 20 MG tablet TAKE ONE-HALF (1/2) TABLET DAILY 45 tablet 1   metoprolol succinate (TOPROL-XL) 50 MG 24 hr tablet Take 1 tablet (50 mg total) by mouth at bedtime. Take with or immediately following a meal. 30 tablet 3   moxifloxacin (VIGAMOX) 0.5 % ophthalmic solution Place 1 drop into the left eye 3 times daily.     Multiple Vitamins-Minerals (CENTRUM SILVER 50+WOMEN PO) Take 1 tablet by mouth daily.      mycophenolate (CELLCEPT) 500 MG tablet Take 500 mg by mouth 2 (two) times daily.     neomycin-polymyxin b-dexamethasone (MAXITROL) 3.5-10000-0.1 OINT Place into the left eye at bedtime.     nepafenac (NEVANAC) 0.1 % ophthalmic suspension Place 1 drop into the right eye 3 (three) times daily.      Omega-3 1000 MG CAPS Take 1,000 mg by mouth daily.      prednisoLONE acetate (PRED FORTE) 1 % ophthalmic suspension Place 1 drop into the left eye 2 (two) times daily.     SURE COMFORT PEN NEEDLES 31G X 8 MM MISC      timolol (BETIMOL) 0.5 % ophthalmic solution Apply 1 drop to eye 2 (two) times daily.     TIROSINT 88 MCG CAPS Take 88 mcg by mouth daily.     valACYclovir (VALTREX) 1000 MG tablet Take 1,000 mg by mouth daily.     ZIOPTAN 0.0015 % SOLN Place 1 drop into both eyes at bedtime.      No current facility-administered  medications for this encounter.    Allergies  Allergen Reactions   Dexamethasone Anaphylaxis and Other (See Comments)    Blood sugar elevated     Brimonidine Tartrate Other (See Comments)    Burning and redness    Clindamycin/Lincomycin Rash   Sulfa Antibiotics Rash   Valacyclovir Hcl Rash    Social History   Socioeconomic History   Marital status: Widowed    Spouse name: Charlane Ferretti   Number of children: 1   Years of education: 12   Highest  education level: 12th grade  Occupational History   Occupation: Retired    Comment: retired  Tobacco Use   Smoking status: Former    Packs/day: 0.25    Years: 20.00    Additional pack years: 0.00    Total pack years: 5.00    Types: Cigarettes   Smokeless tobacco: Never  Vaping Use   Vaping Use: Never used  Substance and Sexual Activity   Alcohol use: Not Currently   Drug use: No   Sexual activity: Not Currently    Partners: Male  Other Topics Concern   Not on file  Social History Narrative   Lives alone. She has one son, who takes her to her doctor's appointments. She enjoys crochet and gardening.   Social Determinants of Health   Financial Resource Strain: Low Risk  (03/17/2022)   Overall Financial Resource Strain (CARDIA)    Difficulty of Paying Living Expenses: Not hard at all  Food Insecurity: No Food Insecurity (06/12/2022)   Hunger Vital Sign    Worried About Running Out of Food in the Last Year: Never true    Ran Out of Food in the Last Year: Never true  Transportation Needs: No Transportation Needs (06/12/2022)   PRAPARE - Administrator, Civil Service (Medical): No    Lack of Transportation (Non-Medical): No  Physical Activity: Sufficiently Active (03/17/2022)   Exercise Vital Sign    Days of Exercise per Week: 4 days    Minutes of Exercise per Session: 40 min  Stress: No Stress Concern Present (03/17/2022)   [employer] of Occupational Health - Occupational Stress Questionnaire    Feeling of Stress :  Not at all  Social Connections: Socially Isolated (03/17/2022)   Social Connection and Isolation Panel [NHANES]    Frequency of Communication with Friends and Family: More than three times a week    Frequency of Social Gatherings with Friends and Family: More than three times a week    Attends Religious Services: Never    Database administrator or Organizations: No    Attends Banker Meetings: Never    Marital Status: Widowed  Intimate Partner Violence: Not At Risk (03/17/2022)   Humiliation, Afraid, Rape, and Kick questionnaire    Fear of Current or Ex-Partner: No    Emotionally Abused: No    Physically Abused: No    Sexually Abused: No     ROS- All systems are reviewed and negative except as per the HPI above.  Physical Exam: Vitals:   06/26/22 0842  BP: (!) 154/106  Pulse: 91  Weight: 63.4 kg  Height: 5\' 5"  (1.651 m)    GEN- The patient is a well appearing female, alert and oriented x 3 today.   Head- normocephalic, atraumatic Eyes-  Sclera clear, conjunctiva pink Ears- hearing intact Oropharynx- clear Neck- supple  Lungs- Clear to ausculation bilaterally, normal work of breathing Heart- Irregular rate and rhythm, no murmurs, rubs or gallops  GI- soft, NT, ND, + BS Extremities- no clubbing, cyanosis, or edema MS- no significant deformity or atrophy Skin- no rash or lesion Psych- euthymic mood, full affect Neuro- strength and sensation are intact  Wt Readings from Last 3 Encounters:  06/26/22 63.4 kg  06/22/22 63.5 kg  05/11/22 61.7 kg    EKG today demonstrates  Vent. rate 91 BPM PR interval * ms QRS duration 84 ms QT/QTcB 360/442 ms P-R-T axes * 78 54 Atrial fibrillation Abnormal ECG When compared with ECG of 12-Feb-2019 10:18,  PREVIOUS ECG IS PRESENT  Echo 04/20/22 demonstrated: 1. Left ventricular ejection fraction, by estimation, is 60 to 65%. The  left ventricle has normal function. The left ventricle has no regional  wall motion  abnormalities. There is mild left ventricular hypertrophy.  Left ventricular diastolic parameters  are consistent with Grade II diastolic dysfunction (pseudonormalization).   2. Right ventricular systolic function is normal. The right ventricular  size is normal.   3. Left atrial size was severely dilated.   4. Right atrial size was mild to moderately dilated.   5. The mitral valve is normal in structure. Mild mitral valve  regurgitation. No evidence of mitral stenosis.   6. The aortic valve is normal in structure. Aortic valve regurgitation is  not visualized. No aortic stenosis is present.   7. There is mild dilatation Upper abdominal, measuring 39 mm.   8. The inferior vena cava is normal in size with greater than 50%  respiratory variability, suggesting right atrial pressure of 3 mmHg.   Comparison(s): EF 40%, mild LVH, RA & LA severely dialted, moderate TR,  mild-mod MR RVSP 38.9 mmHg.   Epic records are reviewed at length today.  CHA2DS2-VASc Score = 9  The patient's score is based upon: CHF History: 1 HTN History: 1 Diabetes History: 1 Stroke History: 2 Vascular Disease History: 1 Age Score: 2 Gender Score: 1       ASSESSMENT AND PLAN: Persistent Atrial Fibrillation (ICD10:  I48.19) The patient's CHA2DS2-VASc score is 9, indicating a 12.2% annual risk of stroke.    She is in rate controlled Afib today.  Education provided about Afib with visual diagram. We talked about medication therapy or ablation if needed in the future for treatment of Afib. Currently, her TSH is very abnormal and this could be contributory. She would be a candidate for cardioversion at this point in time. However, she is also taking current incorrect dosage of Eliquis at 2.5 mg BID.   Will prescribe Eliquis 5 mg BID. Advise endocrinology follow up to correct TSH. F/u 3 weeks to determine if DCCV needs to be scheduled.    2. Secondary Hypercoagulable State (ICD10:  D68.69) The patient is at  significant risk for stroke/thromboembolism based upon her CHA2DS2-VASc Score of 9.  Continue Apixaban (Eliquis).  Increase to Eliquis 5 mg BID, needs to be on this for 3 weeks prior to DCCV. She has been above 60 kg and creatinine has been under 1.5.   3. HTN Elevated today, advised to trend at home.    Follow up 3 weeks Afib clinic.   Lake Bells, PA-C Afib Clinic [facility] [address] [place], [place] [address] [phone] 06/26/2022 9:30 AM

## 2022-06-26 NOTE — Patient Instructions (Signed)
Increase Eliquis to 5mg twice a day 

## 2022-07-05 DIAGNOSIS — H209 Unspecified iridocyclitis: Secondary | ICD-10-CM | POA: Diagnosis not present

## 2022-07-05 DIAGNOSIS — H353212 Exudative age-related macular degeneration, right eye, with inactive choroidal neovascularization: Secondary | ICD-10-CM | POA: Diagnosis not present

## 2022-07-05 DIAGNOSIS — Z961 Presence of intraocular lens: Secondary | ICD-10-CM | POA: Diagnosis not present

## 2022-07-05 DIAGNOSIS — Z9889 Other specified postprocedural states: Secondary | ICD-10-CM | POA: Diagnosis not present

## 2022-07-05 DIAGNOSIS — E119 Type 2 diabetes mellitus without complications: Secondary | ICD-10-CM | POA: Diagnosis not present

## 2022-07-05 DIAGNOSIS — Z794 Long term (current) use of insulin: Secondary | ICD-10-CM | POA: Diagnosis not present

## 2022-07-05 DIAGNOSIS — H4043X3 Glaucoma secondary to eye inflammation, bilateral, severe stage: Secondary | ICD-10-CM | POA: Diagnosis not present

## 2022-07-11 DIAGNOSIS — E039 Hypothyroidism, unspecified: Secondary | ICD-10-CM | POA: Diagnosis not present

## 2022-07-17 ENCOUNTER — Ambulatory Visit (HOSPITAL_COMMUNITY)
Admission: RE | Admit: 2022-07-17 | Discharge: 2022-07-17 | Disposition: A | Discharge status: Home / Self Care | Payer: Medicare Other | Source: Ambulatory Visit | Attending: Internal Medicine | Admitting: Internal Medicine

## 2022-07-17 VITALS — BP 160/90 | HR 105 | Ht 65.0 in | Wt 137.2 lb

## 2022-07-17 DIAGNOSIS — I4819 Other persistent atrial fibrillation: Secondary | ICD-10-CM | POA: Insufficient documentation

## 2022-07-17 DIAGNOSIS — D6869 Other thrombophilia: Secondary | ICD-10-CM | POA: Diagnosis not present

## 2022-07-17 DIAGNOSIS — I700 Atherosclerosis of aorta: Secondary | ICD-10-CM | POA: Diagnosis not present

## 2022-07-17 DIAGNOSIS — N189 Chronic kidney disease, unspecified: Secondary | ICD-10-CM | POA: Diagnosis not present

## 2022-07-17 DIAGNOSIS — E039 Hypothyroidism, unspecified: Secondary | ICD-10-CM | POA: Insufficient documentation

## 2022-07-17 DIAGNOSIS — I10 Essential (primary) hypertension: Secondary | ICD-10-CM | POA: Insufficient documentation

## 2022-07-17 DIAGNOSIS — Z8249 Family history of ischemic heart disease and other diseases of the circulatory system: Secondary | ICD-10-CM | POA: Insufficient documentation

## 2022-07-17 DIAGNOSIS — Z794 Long term (current) use of insulin: Secondary | ICD-10-CM | POA: Diagnosis not present

## 2022-07-17 DIAGNOSIS — E1122 Type 2 diabetes mellitus with diabetic chronic kidney disease: Secondary | ICD-10-CM | POA: Insufficient documentation

## 2022-07-17 DIAGNOSIS — Z8673 Personal history of transient ischemic attack (TIA), and cerebral infarction without residual deficits: Secondary | ICD-10-CM | POA: Diagnosis not present

## 2022-07-17 DIAGNOSIS — Z7901 Long term (current) use of anticoagulants: Secondary | ICD-10-CM | POA: Insufficient documentation

## 2022-07-17 DIAGNOSIS — Z833 Family history of diabetes mellitus: Secondary | ICD-10-CM | POA: Insufficient documentation

## 2022-07-17 NOTE — Patient Instructions (Signed)
Cardioversion scheduled for: Thursday, June 6th   - Arrive at the [facility] and go to admitting at 630am   - Do not eat or drink anything after midnight the night prior to your procedure.   - Take all your morning medication (except diabetic medications) with a sip of water prior to arrival.  - You will not be able to drive home after your procedure.    - Do NOT miss any doses of your blood thinner - if you should miss a dose please notify our office immediately.   - If you feel as if you go back into normal rhythm prior to scheduled cardioversion, please notify our office immediately.   If your procedure is canceled in the cardioversion suite you will be charged a cancellation fee.

## 2022-07-17 NOTE — Progress Notes (Signed)
Primary Care Physician: Everrett Coombe, DO Primary Cardiologist: None Primary Electrophysiologist: Dr. Elberta Fortis Referring Physician: Dr. Aldean Ast Carmen Cooper is a 82 y.o. female with a history of aortic atherosclerosis, diabetes, CKD, history of TIA, hypothyroidism, and atrial fibrillation who presents for consultation in the [facility]. Seen by PCP on 5/9 for hospital f/u due to hypoglycemia and was noted to be in rate controlled Afib. He checked TSH and it was 121.91. Patient is on Eliquis 2.5 mg BID for a CHADS2VASC score of 7.  On follow up today, she is in rate controlled Afib. She is here with her daughter today. She feels very tired when in Afib. She was in Afib during outside hospitalization at end of April but daughter states they were more focused on controlling her extremely high glucose levels. She was seen by PCP on 5/9 and noted to be in Afib; subsequent lab draw showed TSH abnormal at 121.91. Daughter and patient did not yet know this lab work result. Daughter acknowledges that prior to hospitalization she has had overall infrequent episodes of Afib.   She is compliant with anticoagulation and has not missed any doses. She has no bleeding concerns.  On follow up 07/17/22, she is in Afib with RVR. She feels tired and fatigued while in Afib. She will complete 3 weeks of anticoagulation on Eliquis 5 mg BID today. No missed doses. She would like DCCV as soon as possible to feel better.    Today, she denies symptoms of palpitations, chest pain, shortness of breath, orthopnea, PND, lower extremity edema, dizziness, presyncope, syncope, snoring, daytime somnolence, bleeding, or neurologic sequela. The patient is tolerating medications without difficulties and is otherwise without complaint today.    she has a BMI of Body mass index is 23.26 kg/m.Marland Kitchen There were no vitals filed for this visit.   Family History  Problem Relation Age of Onset   Heart disease  Son    Hypertension Mother    Cancer Mother        unsure of origin   Heart attack Father    Diabetes Neg Hx      Atrial Fibrillation Management history:  Previous antiarrhythmic drugs: None Previous cardioversions: unknown Previous ablations: none Anticoagulation history: Eliquis 5 mg BID   Past Medical History:  Diagnosis Date   Atrial fibrillation (HCC)    BCC (basal cell carcinoma of skin)    Diabetes (HCC)    Glaucoma    History of TIA (transient ischemic attack) 08/11/2013   12/2012 - Dr. Fara Olden    Hypertension    Hypothyroidism 08/11/2013   Memory changes    Microscopic colitis 08/21/2013   2008 - [facility] Dr. Myra Gianotti.  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 Loraine Grip, [facility]    Past Surgical History:  Procedure Laterality Date   BREAST EXCISIONAL BIOPSY Left    BREAST EXCISIONAL BIOPSY Left    CARDIOVERSION N/A 02/12/2019   Procedure: CARDIOVERSION;  Surgeon: Chrystie Nose, MD;  Location: [facility];  Service: Cardiovascular;  Laterality: N/A;   MOHS SURGERY  2019   Nose bcc    OTHER SURGICAL HISTORY  04/01/2019   biopsy on nose and lip     Current Outpatient Medications  Medication Sig Dispense Refill   acetaminophen (TYLENOL) 650 MG CR tablet Take 1 tablet (650 mg total) by mouth every 8 (eight) hours as needed  for pain. 90 tablet 3   AMBULATORY NON FORMULARY MEDICATION Freestyle light test strips Test twice a day  Dx type 2 diabetes E11.9 100 each 11   apixaban (ELIQUIS) 5 MG TABS tablet Take 1 tablet (5 mg total) by mouth 2 (two) times daily. 180 tablet 1   atorvastatin (LIPITOR) 40 MG tablet TAKE 1 TABLET DAILY 90 tablet 3   BAQSIMI TWO PACK 3 MG/DOSE POWD Place into both nostrils.     COSOPT PF 2-0.5 % SOLN ophthalmic solution Place 1 drop into both eyes in the morning and at bedtime.     diltiazem (CARDIZEM SR) 60 MG 12 hr capsule TAKE 1 CAPSULE TWICE A DAY 180  capsule 3   folic acid (FOLVITE) 1 MG tablet Take 1 mg by mouth daily.      FREESTYLE LITE test strip      Insulin Disposable Pump (OMNIPOD DASH 5 PACK PODS) MISC Inject into the skin as directed.     insulin glargine (LANTUS SOLOSTAR) 100 UNIT/ML Solostar Pen Use in case of pump failure: Remove pump and take 15 units of Lantus insulin as basal insulin in case of insulin pump malfunction/failure. .Do not place pump back on until 20 hours AFTER last dose of Lantus to prevent "double basal infusion."     Insulin Human (INSULIN PUMP) SOLN Inject into the skin as directed. insulin lispro (HUMALOG) 100 UNIT/ML     insulin lispro (HUMALOG) 100 UNIT/ML injection Medtronic 630G pump.  Basal 12-6a 0.625, 6a-7p 0.725, 7p-12a 0.625.  Preset bolus:  4/5/6.  ISF 50.  Total daily dose:  40 units/day     Lifitegrast (XIIDRA) 5 % SOLN Place 1 drop into both eyes daily.      lisinopril (ZESTRIL) 20 MG tablet TAKE ONE-HALF (1/2) TABLET DAILY 45 tablet 1   metoprolol succinate (TOPROL-XL) 50 MG 24 hr tablet Take 1 tablet (50 mg total) by mouth at bedtime. Take with or immediately following a meal. 30 tablet 3   moxifloxacin (VIGAMOX) 0.5 % ophthalmic solution Place 1 drop into the left eye 3 times daily.     Multiple Vitamins-Minerals (CENTRUM SILVER 50+WOMEN PO) Take 1 tablet by mouth daily.      mycophenolate (CELLCEPT) 500 MG tablet Take 500 mg by mouth 2 (two) times daily.     neomycin-polymyxin b-dexamethasone (MAXITROL) 3.5-10000-0.1 OINT Place into the left eye at bedtime.     nepafenac (NEVANAC) 0.1 % ophthalmic suspension Place 1 drop into the right eye 3 (three) times daily.      Omega-3 1000 MG CAPS Take 1,000 mg by mouth daily.      prednisoLONE acetate (PRED FORTE) 1 % ophthalmic suspension Place 1 drop into the left eye 2 (two) times daily.     SURE COMFORT PEN NEEDLES 31G X 8 MM MISC      timolol (BETIMOL) 0.5 % ophthalmic solution Apply 1 drop to eye 2 (two) times daily.     TIROSINT 88 MCG CAPS Take  88 mcg by mouth daily.     valACYclovir (VALTREX) 1000 MG tablet Take 1,000 mg by mouth daily.     ZIOPTAN 0.0015 % SOLN Place 1 drop into both eyes at bedtime.      No current facility-administered medications for this encounter.    Allergies  Allergen Reactions   Dexamethasone Anaphylaxis and Other (See Comments)    Blood sugar elevated     Brimonidine Tartrate Other (See Comments)    Burning and redness    Clindamycin/Lincomycin  Rash   Sulfa Antibiotics Rash   Valacyclovir Hcl Rash    Social History   Socioeconomic History   Marital status: Widowed    Spouse name: [name]   Number of children: 1   Years of education: 12   Highest education level: 12th grade  Occupational History   Occupation: Retired    Comment: retired  Tobacco Use   Smoking status: Former    Packs/day: 0.25    Years: 20.00    Additional pack years: 0.00    Total pack years: 5.00    Types: Cigarettes   Smokeless tobacco: Never  Vaping Use   Vaping Use: Never used  Substance and Sexual Activity   Alcohol use: Not Currently   Drug use: No   Sexual activity: Not Currently    Partners: Male  Other Topics Concern   Not on file  Social History Narrative   Lives alone. She has one son, who takes her to her doctor's appointments. She enjoys crochet and gardening.   Social Determinants of Health   Financial Resource Strain: Low Risk  (03/17/2022)   Overall Financial Resource Strain (CARDIA)    Difficulty of Paying Living Expenses: Not hard at all  Food Insecurity: No Food Insecurity (06/12/2022)   Hunger Vital Sign    Worried About Running Out of Food in the Last Year: Never true    Ran Out of Food in the Last Year: Never true  Transportation Needs: No Transportation Needs (06/12/2022)   PRAPARE - Administrator, Civil Service (Medical): No    Lack of Transportation (Non-Medical): No  Physical Activity: Sufficiently Active (03/17/2022)   Exercise Vital Sign    Days of Exercise per Week:  4 days    Minutes of Exercise per Session: 40 min  Stress: No Stress Concern Present (03/17/2022)   [employer] of Occupational Health - Occupational Stress Questionnaire    Feeling of Stress : Not at all  Social Connections: Socially Isolated (03/17/2022)   Social Connection and Isolation Panel [NHANES]    Frequency of Communication with Friends and Family: More than three times a week    Frequency of Social Gatherings with Friends and Family: More than three times a week    Attends Religious Services: Never    Database administrator or Organizations: No    Attends Banker Meetings: Never    Marital Status: Widowed  Intimate Partner Violence: Not At Risk (03/17/2022)   Humiliation, Afraid, Rape, and Kick questionnaire    Fear of Current or Ex-Partner: No    Emotionally Abused: No    Physically Abused: No    Sexually Abused: No     ROS- All systems are reviewed and negative except as per the HPI above.  Physical Exam: Vitals:   07/17/22 0829  Height: 5\' 5"  (1.651 m)   GEN- The patient is well appearing, alert and oriented x 3 today.   Head- normocephalic, atraumatic Eyes-  Sclera clear, conjunctiva pink Ears- hearing intact Lungs- Clear to ausculation bilaterally, normal work of breathing Heart- Tachycardic irregular rate and rhythm, no murmurs, rubs or gallops, PMI not laterally displaced Extremities- no clubbing, cyanosis, or edema MS- no significant deformity or atrophy Skin- no rash or lesion Psych- euthymic mood, full affect Neuro- strength and sensation are intact   Wt Readings from Last 3 Encounters:  06/26/22 63.4 kg  06/22/22 63.5 kg  05/11/22 61.7 kg    EKG today demonstrates  Vent. rate 105 BPM PR  interval * ms QRS duration 82 ms QT/QTcB 356/470 ms P-R-T axes * 75 64 Atrial fibrillation with rapid ventricular response Abnormal ECG When compared with ECG of 26-Jun-2022 09:02, PREVIOUS ECG IS PRESENT  Echo 04/20/22 demonstrated: 1. Left  ventricular ejection fraction, by estimation, is 60 to 65%. The  left ventricle has normal function. The left ventricle has no regional  wall motion abnormalities. There is mild left ventricular hypertrophy.  Left ventricular diastolic parameters  are consistent with Grade II diastolic dysfunction (pseudonormalization).   2. Right ventricular systolic function is normal. The right ventricular  size is normal.   3. Left atrial size was severely dilated.   4. Right atrial size was mild to moderately dilated.   5. The mitral valve is normal in structure. Mild mitral valve  regurgitation. No evidence of mitral stenosis.   6. The aortic valve is normal in structure. Aortic valve regurgitation is  not visualized. No aortic stenosis is present.   7. There is mild dilatation Upper abdominal, measuring 39 mm.   8. The inferior vena cava is normal in size with greater than 50%  respiratory variability, suggesting right atrial pressure of 3 mmHg.   Comparison(s): EF 40%, mild LVH, RA & LA severely dialted, moderate TR,  mild-mod MR RVSP 38.9 mmHg.   Epic records are reviewed at length today.  CHA2DS2-VASc Score = 9  The patient's score is based upon: CHF History: 1 HTN History: 1 Diabetes History: 1 Stroke History: 2 Vascular Disease History: 1 Age Score: 2 Gender Score: 1       ASSESSMENT AND PLAN: Persistent Atrial Fibrillation (ICD10:  I48.19) The patient's CHA2DS2-VASc score is 9, indicating a 12.2% annual risk of stroke.    She is in Afib with RVR today. She will have completed 3 full weeks of recommended anticoagulation at Eliquis 5 mg BID. Will schedule for DCCV.  Most recent TSH 26.7 on 07/11/22 by endocrinology (historically has had multiple values around this level). Unsure if prior level of 121 was error.    2. Secondary Hypercoagulable State (ICD10:  D68.69) The patient is at significant risk for stroke/thromboembolism based upon her CHA2DS2-VASc Score of 9.  Continue  Apixaban (Eliquis).  Continue Eliquis 5 mg BID without interruption.   3. HTN Elevated today, advised to trend at home.    Follow up 2 weeks after DCCV.   Lake Bells, PA-C Afib Clinic [facility] [address] [place], [place] [address] [phone] 07/17/2022 8:34 AM

## 2022-07-17 NOTE — H&P (View-Only) (Signed)
  Primary Care Physician: Matthews, Cody, DO Primary Cardiologist: None Primary Electrophysiologist: Dr. Camnitz Referring Physician: Dr. Camnitz   Carmen Cooper is a 81 y.o. female with a history of aortic atherosclerosis, diabetes, CKD, history of TIA, hypothyroidism, and atrial fibrillation who presents for consultation in the [facility]. Seen by PCP on 5/9 for hospital f/u due to hypoglycemia and was noted to be in rate controlled Afib. He checked TSH and it was 121.91. Patient is on Eliquis 2.5 mg BID for a CHADS2VASC score of 7.  On follow up today, she is in rate controlled Afib. She is here with her daughter today. She feels very tired when in Afib. She was in Afib during outside hospitalization at end of April but daughter states they were more focused on controlling her extremely high glucose levels. She was seen by PCP on 5/9 and noted to be in Afib; subsequent lab draw showed TSH abnormal at 121.91. Daughter and patient did not yet know this lab work result. Daughter acknowledges that prior to hospitalization she has had overall infrequent episodes of Afib.   She is compliant with anticoagulation and has not missed any doses. She has no bleeding concerns.  On follow up 07/17/22, she is in Afib with RVR. She feels tired and fatigued while in Afib. She will complete 3 weeks of anticoagulation on Eliquis 5 mg BID today. No missed doses. She would like DCCV as soon as possible to feel better.    Today, she denies symptoms of palpitations, chest pain, shortness of breath, orthopnea, PND, lower extremity edema, dizziness, presyncope, syncope, snoring, daytime somnolence, bleeding, or neurologic sequela. The patient is tolerating medications without difficulties and is otherwise without complaint today.    she has a BMI of Body mass index is 23.26 kg/m.. There were no vitals filed for this visit.   Family History  Problem Relation Age of Onset   Heart disease  Son    Hypertension Mother    Cancer Mother        unsure of origin   Heart attack Father    Diabetes Neg Hx      Atrial Fibrillation Management history:  Previous antiarrhythmic drugs: None Previous cardioversions: unknown Previous ablations: none Anticoagulation history: Eliquis 5 mg BID   Past Medical History:  Diagnosis Date   Atrial fibrillation (HCC)    BCC (basal cell carcinoma of skin)    Diabetes (HCC)    Glaucoma    History of TIA (transient ischemic attack) 08/11/2013   12/2012 - Dr. Seaux    Hypertension    Hypothyroidism 08/11/2013   Memory changes    Microscopic colitis 08/21/2013   2008 - [facility] Dr. Connolley.  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 Moya, [facility]    Past Surgical History:  Procedure Laterality Date   BREAST EXCISIONAL BIOPSY Left    BREAST EXCISIONAL BIOPSY Left    CARDIOVERSION N/A 02/12/2019   Procedure: CARDIOVERSION;  Surgeon: Hilty, Kenneth C, MD;  Location: [facility];  Service: Cardiovascular;  Laterality: N/A;   MOHS SURGERY  2019   Nose bcc    OTHER SURGICAL HISTORY  04/01/2019   biopsy on nose and lip     Current Outpatient Medications  Medication Sig Dispense Refill   acetaminophen (TYLENOL) 650 MG CR tablet Take 1 tablet (650 mg total) by mouth every 8 (eight) hours as needed   for pain. 90 tablet 3   AMBULATORY NON FORMULARY MEDICATION Freestyle light test strips Test twice a day  Dx type 2 diabetes E11.9 100 each 11   apixaban (ELIQUIS) 5 MG TABS tablet Take 1 tablet (5 mg total) by mouth 2 (two) times daily. 180 tablet 1   atorvastatin (LIPITOR) 40 MG tablet TAKE 1 TABLET DAILY 90 tablet 3   BAQSIMI TWO PACK 3 MG/DOSE POWD Place into both nostrils.     COSOPT PF 2-0.5 % SOLN ophthalmic solution Place 1 drop into both eyes in the morning and at bedtime.     diltiazem (CARDIZEM SR) 60 MG 12 hr capsule TAKE 1 CAPSULE TWICE A DAY 180  capsule 3   folic acid (FOLVITE) 1 MG tablet Take 1 mg by mouth daily.      FREESTYLE LITE test strip      Insulin Disposable Pump (OMNIPOD DASH 5 PACK PODS) MISC Inject into the skin as directed.     insulin glargine (LANTUS SOLOSTAR) 100 UNIT/ML Solostar Pen Use in case of pump failure: Remove pump and take 15 units of Lantus insulin as basal insulin in case of insulin pump malfunction/failure. .Do not place pump back on until 20 hours AFTER last dose of Lantus to prevent "double basal infusion."     Insulin Human (INSULIN PUMP) SOLN Inject into the skin as directed. insulin lispro (HUMALOG) 100 UNIT/ML     insulin lispro (HUMALOG) 100 UNIT/ML injection Medtronic 630G pump.  Basal 12-6a 0.625, 6a-7p 0.725, 7p-12a 0.625.  Preset bolus:  4/5/6.  ISF 50.  Total daily dose:  40 units/day     Lifitegrast (XIIDRA) 5 % SOLN Place 1 drop into both eyes daily.      lisinopril (ZESTRIL) 20 MG tablet TAKE ONE-HALF (1/2) TABLET DAILY 45 tablet 1   metoprolol succinate (TOPROL-XL) 50 MG 24 hr tablet Take 1 tablet (50 mg total) by mouth at bedtime. Take with or immediately following a meal. 30 tablet 3   moxifloxacin (VIGAMOX) 0.5 % ophthalmic solution Place 1 drop into the left eye 3 times daily.     Multiple Vitamins-Minerals (CENTRUM SILVER 50+WOMEN PO) Take 1 tablet by mouth daily.      mycophenolate (CELLCEPT) 500 MG tablet Take 500 mg by mouth 2 (two) times daily.     neomycin-polymyxin b-dexamethasone (MAXITROL) 3.5-10000-0.1 OINT Place into the left eye at bedtime.     nepafenac (NEVANAC) 0.1 % ophthalmic suspension Place 1 drop into the right eye 3 (three) times daily.      Omega-3 1000 MG CAPS Take 1,000 mg by mouth daily.      prednisoLONE acetate (PRED FORTE) 1 % ophthalmic suspension Place 1 drop into the left eye 2 (two) times daily.     SURE COMFORT PEN NEEDLES 31G X 8 MM MISC      timolol (BETIMOL) 0.5 % ophthalmic solution Apply 1 drop to eye 2 (two) times daily.     TIROSINT 88 MCG CAPS Take  88 mcg by mouth daily.     valACYclovir (VALTREX) 1000 MG tablet Take 1,000 mg by mouth daily.     ZIOPTAN 0.0015 % SOLN Place 1 drop into both eyes at bedtime.      No current facility-administered medications for this encounter.    Allergies  Allergen Reactions   Dexamethasone Anaphylaxis and Other (See Comments)    Blood sugar elevated     Brimonidine Tartrate Other (See Comments)    Burning and redness    Clindamycin/Lincomycin   Rash   Sulfa Antibiotics Rash   Valacyclovir Hcl Rash    Social History   Socioeconomic History   Marital status: Widowed    Spouse name: [name]   Number of children: 1   Years of education: 12   Highest education level: 12th grade  Occupational History   Occupation: Retired    Comment: retired  Tobacco Use   Smoking status: Former    Packs/day: 0.25    Years: 20.00    Additional pack years: 0.00    Total pack years: 5.00    Types: Cigarettes   Smokeless tobacco: Never  Vaping Use   Vaping Use: Never used  Substance and Sexual Activity   Alcohol use: Not Currently   Drug use: No   Sexual activity: Not Currently    Partners: Male  Other Topics Concern   Not on file  Social History Narrative   Lives alone. She has one son, who takes her to her doctor's appointments. She enjoys crochet and gardening.   Social Determinants of Health   Financial Resource Strain: Low Risk  (03/17/2022)   Overall Financial Resource Strain (CARDIA)    Difficulty of Paying Living Expenses: Not hard at all  Food Insecurity: No Food Insecurity (06/12/2022)   Hunger Vital Sign    Worried About Running Out of Food in the Last Year: Never true    Ran Out of Food in the Last Year: Never true  Transportation Needs: No Transportation Needs (06/12/2022)   PRAPARE - Transportation    Lack of Transportation (Medical): No    Lack of Transportation (Non-Medical): No  Physical Activity: Sufficiently Active (03/17/2022)   Exercise Vital Sign    Days of Exercise per Week:  4 days    Minutes of Exercise per Session: 40 min  Stress: No Stress Concern Present (03/17/2022)   [employer] - Occupational Stress Questionnaire    Feeling of Stress : Not at all  Social Connections: Socially Isolated (03/17/2022)   Social Connection and Isolation Panel [NHANES]    Frequency of Communication with Friends and Family: More than three times a week    Frequency of Social Gatherings with Friends and Family: More than three times a week    Attends Religious Services: Never    Active Member of Clubs or Organizations: No    Attends Club or Organization Meetings: Never    Marital Status: Widowed  Intimate Partner Violence: Not At Risk (03/17/2022)   Humiliation, Afraid, Rape, and Kick questionnaire    Fear of Current or Ex-Partner: No    Emotionally Abused: No    Physically Abused: No    Sexually Abused: No     ROS- All systems are reviewed and negative except as per the HPI above.  Physical Exam: Vitals:   07/17/22 0829  Height: 5' 5" (1.651 m)   GEN- The patient is well appearing, alert and oriented x 3 today.   Head- normocephalic, atraumatic Eyes-  Sclera clear, conjunctiva pink Ears- hearing intact Lungs- Clear to ausculation bilaterally, normal work of breathing Heart- Tachycardic irregular rate and rhythm, no murmurs, rubs or gallops, PMI not laterally displaced Extremities- no clubbing, cyanosis, or edema MS- no significant deformity or atrophy Skin- no rash or lesion Psych- euthymic mood, full affect Neuro- strength and sensation are intact   Wt Readings from Last 3 Encounters:  06/26/22 63.4 kg  06/22/22 63.5 kg  05/11/22 61.7 kg    EKG today demonstrates  Vent. rate 105 BPM PR   interval * ms QRS duration 82 ms QT/QTcB 356/470 ms P-R-T axes * 75 64 Atrial fibrillation with rapid ventricular response Abnormal ECG When compared with ECG of 26-Jun-2022 09:02, PREVIOUS ECG IS PRESENT  Echo 04/20/22 demonstrated: 1. Left  ventricular ejection fraction, by estimation, is 60 to 65%. The  left ventricle has normal function. The left ventricle has no regional  wall motion abnormalities. There is mild left ventricular hypertrophy.  Left ventricular diastolic parameters  are consistent with Grade II diastolic dysfunction (pseudonormalization).   2. Right ventricular systolic function is normal. The right ventricular  size is normal.   3. Left atrial size was severely dilated.   4. Right atrial size was mild to moderately dilated.   5. The mitral valve is normal in structure. Mild mitral valve  regurgitation. No evidence of mitral stenosis.   6. The aortic valve is normal in structure. Aortic valve regurgitation is  not visualized. No aortic stenosis is present.   7. There is mild dilatation Upper abdominal, measuring 39 mm.   8. The inferior vena cava is normal in size with greater than 50%  respiratory variability, suggesting right atrial pressure of 3 mmHg.   Comparison(s): EF 40%, mild LVH, RA & LA severely dialted, moderate TR,  mild-mod MR RVSP 38.9 mmHg.   Epic records are reviewed at length today.  CHA2DS2-VASc Score = 9  The patient's score is based upon: CHF History: 1 HTN History: 1 Diabetes History: 1 Stroke History: 2 Vascular Disease History: 1 Age Score: 2 Gender Score: 1       ASSESSMENT AND PLAN: Persistent Atrial Fibrillation (ICD10:  I48.19) The patient's CHA2DS2-VASc score is 9, indicating a 12.2% annual risk of stroke.    She is in Afib with RVR today. She will have completed 3 full weeks of recommended anticoagulation at Eliquis 5 mg BID. Will schedule for DCCV.  Most recent TSH 26.7 on 07/11/22 by endocrinology (historically has had multiple values around this level). Unsure if prior level of 121 was error.    2. Secondary Hypercoagulable State (ICD10:  D68.69) The patient is at significant risk for stroke/thromboembolism based upon her CHA2DS2-VASc Score of 9.  Continue  Apixaban (Eliquis).  Continue Eliquis 5 mg BID without interruption.   3. HTN Elevated today, advised to trend at home.    Follow up 2 weeks after DCCV.   Alexismarie Flaim, PA-C Afib Clinic [facility] [address] [place], [place] [address] [phone] 07/17/2022 8:34 AM 

## 2022-07-19 NOTE — Pre-Procedure Instructions (Signed)
Spoke to patient on phone regarding cardioversion tomorrow - arrive at 0630, NPO after midnight, confirmed patient has ride home and responsible person to stay with her for 24 hours after procedure, instructed patient to continue taking Eliquis, take AM meds with sip of water  While on phone with patient, she mentioned she's been forgetful and asked that I call her son. Darryl, son, called. Instructions repeated

## 2022-07-20 ENCOUNTER — Ambulatory Visit (HOSPITAL_COMMUNITY): Payer: Medicare Other | Admitting: Anesthesiology

## 2022-07-20 ENCOUNTER — Encounter (HOSPITAL_COMMUNITY)
Admission: RE | Disposition: A | Discharge status: Home / Self Care | Payer: Self-pay | Source: Home / Self Care | Attending: Cardiology

## 2022-07-20 ENCOUNTER — Ambulatory Visit (HOSPITAL_BASED_OUTPATIENT_CLINIC_OR_DEPARTMENT_OTHER): Payer: Medicare Other | Admitting: Anesthesiology

## 2022-07-20 ENCOUNTER — Encounter (HOSPITAL_COMMUNITY): Payer: Self-pay | Admitting: Cardiology

## 2022-07-20 ENCOUNTER — Other Ambulatory Visit: Payer: Self-pay

## 2022-07-20 ENCOUNTER — Ambulatory Visit (HOSPITAL_COMMUNITY)
Admission: RE | Admit: 2022-07-20 | Discharge: 2022-07-20 | Disposition: A | Discharge status: Home / Self Care | Payer: Medicare Other | Attending: Cardiology | Admitting: Cardiology

## 2022-07-20 DIAGNOSIS — I4891 Unspecified atrial fibrillation: Secondary | ICD-10-CM | POA: Diagnosis not present

## 2022-07-20 DIAGNOSIS — Z87891 Personal history of nicotine dependence: Secondary | ICD-10-CM | POA: Insufficient documentation

## 2022-07-20 DIAGNOSIS — Z7901 Long term (current) use of anticoagulants: Secondary | ICD-10-CM | POA: Insufficient documentation

## 2022-07-20 DIAGNOSIS — D6869 Other thrombophilia: Secondary | ICD-10-CM | POA: Insufficient documentation

## 2022-07-20 DIAGNOSIS — I10 Essential (primary) hypertension: Secondary | ICD-10-CM | POA: Insufficient documentation

## 2022-07-20 DIAGNOSIS — E119 Type 2 diabetes mellitus without complications: Secondary | ICD-10-CM

## 2022-07-20 DIAGNOSIS — I4819 Other persistent atrial fibrillation: Secondary | ICD-10-CM | POA: Diagnosis not present

## 2022-07-20 HISTORY — PX: CARDIOVERSION: SHX1299

## 2022-07-20 LAB — GLUCOSE, CAPILLARY
Glucose-Capillary: 143 mg/dL — ABNORMAL HIGH (ref 70–99)
Glucose-Capillary: 150 mg/dL — ABNORMAL HIGH (ref 70–99)

## 2022-07-20 SURGERY — CARDIOVERSION
Anesthesia: General

## 2022-07-20 MED ORDER — SODIUM CHLORIDE 0.9 % IV SOLN: INTRAVENOUS | Status: DC

## 2022-07-20 MED ORDER — PROPOFOL 10 MG/ML IV BOLUS
INTRAVENOUS | Status: DC | PRN
  Administered 2022-07-20: 70 mg via INTRAVENOUS

## 2022-07-20 MED ORDER — LIDOCAINE 2% (20 MG/ML) 5 ML SYRINGE
INTRAMUSCULAR | Status: DC | PRN
  Administered 2022-07-20: 60 mg via INTRAVENOUS

## 2022-07-20 SURGICAL SUPPLY — 1 items: ELECT DEFIB PAD ADLT CADENCE (PAD) ×1 IMPLANT

## 2022-07-20 NOTE — Transfer of Care (Signed)
Immediate Anesthesia Transfer of Care Note  Patient: Carmen Cooper  Procedure(s) Performed: CARDIOVERSION  Patient Location: PACU  Anesthesia Type:MAC  Level of Consciousness: sedated  Airway & Oxygen Therapy: Patient Spontanous Breathing and Patient connected to nasal cannula oxygen  Post-op Assessment: Report given to RN and Post -op Vital signs reviewed and stable  Post vital signs: Reviewed and stable  Last Vitals:  Vitals Value Taken Time  BP 145/80   Temp 98   Pulse 84 07/20/22 0742  Resp 27 07/20/22 0742  SpO2 96 % 07/20/22 0742  Vitals shown include unvalidated device data.  Last Pain:  Vitals:   07/20/22 0646  TempSrc:   PainSc: 0-No pain         Complications: No notable events documented.

## 2022-07-20 NOTE — CV Procedure (Signed)
    Electrical Cardioversion Procedure Note Carmen Cooper [id number] 1940-05-21  Procedure: Electrical Cardioversion Indications:  Atrial Fibrillation  Time Out: Verified patient identification, verified procedure,medications/allergies/relevent history reviewed, required imaging and test results available.  Performed  Procedure Details  The patient was NPO after midnight. Anesthesia was administered at the beside  by Dr.Rose with propofol.  Cardioversion was performed with synchronized biphasic defibrillation via AP pads with 200 joules.  1 attempt(s) were performed.  The patient converted to normal sinus rhythm. PAC's, brief PAT noted.  The patient tolerated the procedure well   IMPRESSION:  Successful cardioversion of atrial fibrillation    [name] 07/20/2022, 7:53 AM

## 2022-07-20 NOTE — Anesthesia Preprocedure Evaluation (Signed)
Anesthesia Evaluation  Patient identified by MRN, date of birth, ID band Patient awake    Reviewed: Allergy & Precautions, H&P , NPO status , Patient's Chart, lab work & pertinent test results  Airway Mallampati: II   Neck ROM: full    Dental   Pulmonary former smoker   breath sounds clear to auscultation       Cardiovascular hypertension, + dysrhythmias Atrial Fibrillation  Rhythm:irregular Rate:Normal     Neuro/Psych    GI/Hepatic ,GERD  ,,  Endo/Other  diabetes, Type 2Hypothyroidism    Renal/GU      Musculoskeletal  (+) Arthritis ,    Abdominal   Peds  Hematology   Anesthesia Other Findings   Reproductive/Obstetrics                             Anesthesia Physical Anesthesia Plan  ASA: 3  Anesthesia Plan: General   Post-op Pain Management:    Induction: Intravenous  PONV Risk Score and Plan: 3 and Propofol infusion and Treatment may vary due to age or medical condition  Airway Management Planned: Mask  Additional Equipment:   Intra-op Plan:   Post-operative Plan:   Informed Consent: I have reviewed the patients History and Physical, chart, labs and discussed the procedure including the risks, benefits and alternatives for the proposed anesthesia with the patient or authorized representative who has indicated his/her understanding and acceptance.     Dental advisory given  Plan Discussed with: CRNA, Anesthesiologist and Surgeon  Anesthesia Plan Comments:        Anesthesia Quick Evaluation

## 2022-07-20 NOTE — Discharge Instructions (Signed)

## 2022-07-20 NOTE — Interval H&P Note (Signed)
History and Physical Interval Note:  07/20/2022 7:35 AM  Carmen Cooper  has presented today for surgery, with the diagnosis of afib.  The various methods of treatment have been discussed with the patient and family. After consideration of risks, benefits and other options for treatment, the patient has consented to  Procedure(s): CARDIOVERSION (N/A) as a surgical intervention.  The patient's history has been reviewed, patient examined, no change in status, stable for surgery.  I have reviewed the patient's chart and labs.  Questions were answered to the patient's satisfaction.     [employer]

## 2022-07-21 ENCOUNTER — Encounter (HOSPITAL_COMMUNITY): Payer: Self-pay | Admitting: Cardiology

## 2022-07-31 DIAGNOSIS — E039 Hypothyroidism, unspecified: Secondary | ICD-10-CM | POA: Diagnosis not present

## 2022-07-31 NOTE — Anesthesia Postprocedure Evaluation (Signed)
Anesthesia Post Note  Patient: Carmen Cooper  Procedure(s) Performed: CARDIOVERSION     Patient location during evaluation: PACU Anesthesia Type: General Level of consciousness: awake and alert Pain management: pain level controlled Vital Signs Assessment: post-procedure vital signs reviewed and stable Respiratory status: spontaneous breathing, nonlabored ventilation, respiratory function stable and patient connected to nasal cannula oxygen Cardiovascular status: blood pressure returned to baseline and stable Postop Assessment: no apparent nausea or vomiting Anesthetic complications: no  No notable events documented.  Last Vitals:  Vitals:   07/20/22 0807 07/20/22 0817  BP: 132/61 (!) 120/95  Pulse: 68 70  Resp: 19 14  Temp:    SpO2: 94% 98%    Last Pain:  Vitals:   07/20/22 0817  TempSrc:   PainSc: 0-No pain                 Aaryan Essman S

## 2022-08-01 ENCOUNTER — Ambulatory Visit (HOSPITAL_COMMUNITY)
Admission: RE | Admit: 2022-08-01 | Discharge: 2022-08-01 | Disposition: A | Discharge status: Home / Self Care | Payer: Medicare Other | Source: Ambulatory Visit | Attending: Internal Medicine | Admitting: Internal Medicine

## 2022-08-01 ENCOUNTER — Encounter (HOSPITAL_COMMUNITY): Payer: Self-pay | Admitting: Internal Medicine

## 2022-08-01 ENCOUNTER — Inpatient Hospital Stay (HOSPITAL_COMMUNITY)
Admission: RE | Admit: 2022-08-01 | Discharge: 2022-08-01 | Disposition: A | Discharge status: Home / Self Care | Payer: Medicare Other | Source: Ambulatory Visit | Attending: Internal Medicine | Admitting: Internal Medicine

## 2022-08-01 VITALS — BP 124/78 | HR 57 | Ht 65.0 in | Wt 133.4 lb

## 2022-08-01 DIAGNOSIS — Z8673 Personal history of transient ischemic attack (TIA), and cerebral infarction without residual deficits: Secondary | ICD-10-CM | POA: Insufficient documentation

## 2022-08-01 DIAGNOSIS — E039 Hypothyroidism, unspecified: Secondary | ICD-10-CM | POA: Insufficient documentation

## 2022-08-01 DIAGNOSIS — I700 Atherosclerosis of aorta: Secondary | ICD-10-CM | POA: Diagnosis not present

## 2022-08-01 DIAGNOSIS — I129 Hypertensive chronic kidney disease with stage 1 through stage 4 chronic kidney disease, or unspecified chronic kidney disease: Secondary | ICD-10-CM | POA: Diagnosis not present

## 2022-08-01 DIAGNOSIS — N189 Chronic kidney disease, unspecified: Secondary | ICD-10-CM | POA: Diagnosis not present

## 2022-08-01 DIAGNOSIS — E1122 Type 2 diabetes mellitus with diabetic chronic kidney disease: Secondary | ICD-10-CM | POA: Diagnosis not present

## 2022-08-01 DIAGNOSIS — I471 Supraventricular tachycardia, unspecified: Secondary | ICD-10-CM

## 2022-08-01 DIAGNOSIS — I4819 Other persistent atrial fibrillation: Secondary | ICD-10-CM | POA: Diagnosis not present

## 2022-08-01 DIAGNOSIS — D6869 Other thrombophilia: Secondary | ICD-10-CM | POA: Diagnosis not present

## 2022-08-01 DIAGNOSIS — Z794 Long term (current) use of insulin: Secondary | ICD-10-CM | POA: Diagnosis not present

## 2022-08-01 DIAGNOSIS — Z7901 Long term (current) use of anticoagulants: Secondary | ICD-10-CM | POA: Insufficient documentation

## 2022-08-01 NOTE — Progress Notes (Addendum)
Primary Care Physician: Everrett Coombe, DO Primary Cardiologist: None Primary Electrophysiologist: Dr. Elberta Fortis Referring Physician: Dr. Aldean Ast Carmen Cooper is a 82 y.o. female with a history of aortic atherosclerosis, diabetes, CKD, history of TIA, hypothyroidism, and atrial fibrillation who presents for consultation in the [facility]. Seen by PCP on 5/9 for hospital f/u due to hypoglycemia and was noted to be in rate controlled Afib. He checked TSH and it was 121.91. Patient is on Eliquis 2.5 mg BID for a CHADS2VASC score of 7.  On follow up today, she is in rate controlled Afib. She is here with her daughter today. She feels very tired when in Afib. She was in Afib during outside hospitalization at end of April but daughter states they were more focused on controlling her extremely high glucose levels. She was seen by PCP on 5/9 and noted to be in Afib; subsequent lab draw showed TSH abnormal at 121.91. Daughter and patient did not yet know this lab work result. Daughter acknowledges that prior to hospitalization she has had overall infrequent episodes of Afib.   She is compliant with anticoagulation and has not missed any doses. She has no bleeding concerns.  On follow up 07/17/22, she is in Afib with RVR. She feels tired and fatigued while in Afib. She will complete 3 weeks of anticoagulation on Eliquis 5 mg BID today. No missed doses. She would like DCCV as soon as possible to feel better.    On follow up 08/01/22, she is s/p DCCV on 07/20/22 with successful conversion to NSR. She feels unwell since cardioversion. She feels intermittently rapid beats that are bothersome. She feels tired. She has not checked her HR at home.   Today, she denies symptoms of palpitations, chest pain, shortness of breath, orthopnea, PND, lower extremity edema, dizziness, presyncope, syncope, snoring, daytime somnolence, bleeding, or neurologic sequela. The patient is tolerating  medications without difficulties and is otherwise without complaint today.    she has a BMI of Body mass index is 22.2 kg/m.Marland Kitchen Filed Weights   08/01/22 0858  Weight: 60.5 kg     Family History  Problem Relation Age of Onset   Heart disease Son    Hypertension Mother    Cancer Mother        unsure of origin   Heart attack Father    Diabetes Neg Hx     Atrial Fibrillation Management history:  Previous antiarrhythmic drugs: None Previous cardioversions: unknown Previous ablations: none Anticoagulation history: Eliquis 5 mg BID   Past Medical History:  Diagnosis Date   Atrial fibrillation (HCC)    BCC (basal cell carcinoma of skin)    Diabetes (HCC)    Glaucoma    History of TIA (transient ischemic attack) 08/11/2013   12/2012 - Dr. Fara Olden    Hypertension    Hypothyroidism 08/11/2013   Memory changes    Microscopic colitis 08/21/2013   2008 - [facility] Dr. Myra Gianotti.  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 Loraine Grip, [facility]    Past Surgical History:  Procedure Laterality Date   BREAST EXCISIONAL BIOPSY Left    BREAST EXCISIONAL BIOPSY Left    CARDIOVERSION N/A 02/12/2019   Procedure: CARDIOVERSION;  Surgeon: Chrystie Nose, MD;  Location: [facility] ENDOSCOPY;  Service: Cardiovascular;  Laterality: N/A;   CARDIOVERSION N/A 07/20/2022   Procedure: CARDIOVERSION;  Surgeon: Jake Bathe, MD;  Location: [facility] INVASIVE CV LAB;  Service: Cardiovascular;  Laterality: N/A;   MOHS SURGERY  2019   Nose bcc    OTHER SURGICAL HISTORY  04/01/2019   biopsy on nose and lip     Current Outpatient Medications  Medication Sig Dispense Refill   acetaminophen (TYLENOL) 650 MG CR tablet Take 1 tablet (650 mg total) by mouth every 8 (eight) hours as needed for pain. 90 tablet 3   AMBULATORY NON FORMULARY MEDICATION Freestyle light test strips Test twice a day  Dx type 2 diabetes E11.9 100 each 11   apixaban  (ELIQUIS) 5 MG TABS tablet Take 1 tablet (5 mg total) by mouth 2 (two) times daily. 180 tablet 1   atorvastatin (LIPITOR) 40 MG tablet TAKE 1 TABLET DAILY 90 tablet 3   BAQSIMI TWO PACK 3 MG/DOSE POWD Place 1 puff into both nostrils as needed (hypoglycemia).     COSOPT PF 2-0.5 % SOLN ophthalmic solution Place 1 drop into both eyes in the morning and at bedtime.     diltiazem (CARDIZEM SR) 60 MG 12 hr capsule TAKE 1 CAPSULE TWICE A DAY 180 capsule 3   folic acid (FOLVITE) 1 MG tablet Take 1 mg by mouth daily.      FREESTYLE LITE test strip      Insulin Disposable Pump (OMNIPOD DASH 5 PACK PODS) MISC Inject into the skin as directed.     insulin glargine (LANTUS SOLOSTAR) 100 UNIT/ML Solostar Pen Use in case of pump failure: Remove pump and take 15 units of Lantus insulin as basal insulin in case of insulin pump malfunction/failure. .Do not place pump back on until 20 hours AFTER last dose of Lantus to prevent "double basal infusion."     Insulin Human (INSULIN PUMP) SOLN Inject into the skin as directed. insulin lispro (HUMALOG) 100 UNIT/ML     insulin lispro (HUMALOG) 100 UNIT/ML injection Medtronic 630G pump.  Basal 12-6a 0.625, 6a-7p 0.725, 7p-12a 0.625.  Preset bolus:  4/5/6.  ISF 50.  Total daily dose:  40 units/day     Lifitegrast (XIIDRA) 5 % SOLN Place 1 drop into both eyes daily.      lisinopril (ZESTRIL) 20 MG tablet TAKE ONE-HALF (1/2) TABLET DAILY 45 tablet 1   metoprolol succinate (TOPROL-XL) 50 MG 24 hr tablet Take 1 tablet (50 mg total) by mouth at bedtime. Take with or immediately following a meal. 30 tablet 3   Multiple Vitamins-Minerals (CENTRUM SILVER 50+WOMEN PO) Take 1 tablet by mouth daily.      mycophenolate (CELLCEPT) 500 MG tablet Take 500 mg by mouth 2 (two) times daily.     neomycin-polymyxin b-dexamethasone (MAXITROL) 3.5-10000-0.1 OINT Place into the left eye at bedtime.     SURE COMFORT PEN NEEDLES 31G X 8 MM MISC      timolol (BETIMOL) 0.5 % ophthalmic solution Place  1 drop into both eyes 2 (two) times daily.     TIROSINT 88 MCG CAPS Take 88 mcg by mouth 2 (two) times a week. 4 gel capsules on Wednesday and Saturday on empty stomach     valACYclovir (VALTREX) 1000 MG tablet Take 1,000 mg by mouth daily.     ZIOPTAN 0.0015 % SOLN Place 1 drop into both eyes at bedtime.      Omega-3 1000 MG CAPS Take 1,000 mg by mouth daily.  (Patient not taking: Reported on 08/01/2022)     No current facility-administered medications for this encounter.    ROS- All systems are reviewed and  negative except as per the HPI above.  Physical Exam: Vitals:   08/01/22 0858  BP: 124/78  Pulse: (!) 57  Weight: 60.5 kg  Height: 5\' 5"  (1.651 m)    GEN- The patient is well appearing, alert and oriented x 3 today.   Head- normocephalic, atraumatic Eyes-  Sclera clear, conjunctiva pink Ears- hearing intact Lungs- Clear to ausculation bilaterally, normal work of breathing Heart- Regular rate and rhythm, no murmurs, rubs or gallops, PMI not laterally displaced Extremities- no clubbing, cyanosis, or edema MS- no significant deformity or atrophy Skin- no rash or lesion Psych- euthymic mood, full affect Neuro- strength and sensation are intact   Wt Readings from Last 3 Encounters:  08/01/22 60.5 kg  07/20/22 62.1 kg  07/17/22 62.2 kg    EKG today demonstrates  Vent. rate 57 BPM PR interval 172 ms QRS duration 82 ms QT/QTcB 444/432 ms P-R-T axes 59 81 74 Sinus bradycardia Otherwise normal ECG When compared with ECG of 20-Jul-2022 07:59, PREVIOUS ECG IS PRESENT  Echo 04/20/22 demonstrated: 1. Left ventricular ejection fraction, by estimation, is 60 to 65%. The  left ventricle has normal function. The left ventricle has no regional  wall motion abnormalities. There is mild left ventricular hypertrophy.  Left ventricular diastolic parameters  are consistent with Grade II diastolic dysfunction (pseudonormalization).   2. Right ventricular systolic function is normal.  The right ventricular  size is normal.   3. Left atrial size was severely dilated.   4. Right atrial size was mild to moderately dilated.   5. The mitral valve is normal in structure. Mild mitral valve  regurgitation. No evidence of mitral stenosis.   6. The aortic valve is normal in structure. Aortic valve regurgitation is  not visualized. No aortic stenosis is present.   7. There is mild dilatation Upper abdominal, measuring 39 mm.   8. The inferior vena cava is normal in size with greater than 50%  respiratory variability, suggesting right atrial pressure of 3 mmHg.   Comparison(s): EF 40%, mild LVH, RA & LA severely dialted, moderate TR,  mild-mod MR RVSP 38.9 mmHg.   Epic records are reviewed at length today.  CHA2DS2-VASc Score = 9  The patient's score is based upon: CHF History: 1 HTN History: 1 Diabetes History: 1 Stroke History: 2 Vascular Disease History: 1 Age Score: 2 Gender Score: 1       ASSESSMENT AND PLAN: Persistent Atrial Fibrillation (ICD10:  I48.19) The patient's CHA2DS2-VASc score is 9, indicating a 12.2% annual risk of stroke.    She is s/p successful cardioversion on 07/20/22. She notes to still feel unwell since cardioversion and complains of intermittent rapid heartbeats and feels tired. Review of monitor in March showed multiple SVT episodes; echo was normal.   We discussed potentially decreasing her Toprol if she is symptomatic due to bradycardia, but on the other hand may benefit from increase if multiple episodes of SVT are making her feel unwell. Will place 1 week monitor and have her follow up with Dr. Elberta Fortis in case something procedural can be recommended regarding SVT episodes (over 7,000 episodes in March monitor; will compare with new monitor on Toprol 50 mg) or if she is having paroxysmal Afib. I took an extended listen and could not appreciate any ectopy on auscultation.     2. Secondary Hypercoagulable State (ICD10:  D68.69) The patient is  at significant risk for stroke/thromboembolism based upon her CHA2DS2-VASc Score of 9.  Continue Apixaban (Eliquis).  Continue Eliquis 5  mg BID without interruption.   3. HTN Elevated today, advised to trend at home.    Follow up will be arranged with Dr. Elberta Fortis.    L[place] Bells, PA-C Afib Clinic [facility] [address] [place], [place] [address] [phone] 08/01/2022 9:29 AM

## 2022-08-07 ENCOUNTER — Ambulatory Visit (HOSPITAL_COMMUNITY): Payer: Medicare Other | Admitting: Internal Medicine

## 2022-08-07 DIAGNOSIS — H401120 Primary open-angle glaucoma, left eye, stage unspecified: Secondary | ICD-10-CM | POA: Diagnosis not present

## 2022-08-07 DIAGNOSIS — Z79899 Other long term (current) drug therapy: Secondary | ICD-10-CM | POA: Diagnosis not present

## 2022-08-07 DIAGNOSIS — H44111 Panuveitis, right eye: Secondary | ICD-10-CM | POA: Diagnosis not present

## 2022-08-07 DIAGNOSIS — E039 Hypothyroidism, unspecified: Secondary | ICD-10-CM | POA: Diagnosis not present

## 2022-08-07 DIAGNOSIS — E1022 Type 1 diabetes mellitus with diabetic chronic kidney disease: Secondary | ICD-10-CM | POA: Diagnosis not present

## 2022-08-07 DIAGNOSIS — N183 Chronic kidney disease, stage 3 unspecified: Secondary | ICD-10-CM | POA: Diagnosis not present

## 2022-08-07 DIAGNOSIS — H209 Unspecified iridocyclitis: Secondary | ICD-10-CM | POA: Diagnosis not present

## 2022-08-07 DIAGNOSIS — H353212 Exudative age-related macular degeneration, right eye, with inactive choroidal neovascularization: Secondary | ICD-10-CM | POA: Diagnosis not present

## 2022-08-07 DIAGNOSIS — H4041X1 Glaucoma secondary to eye inflammation, right eye, mild stage: Secondary | ICD-10-CM | POA: Diagnosis not present

## 2022-08-07 DIAGNOSIS — H3581 Retinal edema: Secondary | ICD-10-CM | POA: Diagnosis not present

## 2022-08-07 DIAGNOSIS — H30031 Focal chorioretinal inflammation, peripheral, right eye: Secondary | ICD-10-CM | POA: Diagnosis not present

## 2022-08-07 DIAGNOSIS — H353221 Exudative age-related macular degeneration, left eye, with active choroidal neovascularization: Secondary | ICD-10-CM | POA: Diagnosis not present

## 2022-08-07 DIAGNOSIS — E1049 Type 1 diabetes mellitus with other diabetic neurological complication: Secondary | ICD-10-CM | POA: Diagnosis not present

## 2022-08-07 DIAGNOSIS — Z961 Presence of intraocular lens: Secondary | ICD-10-CM | POA: Diagnosis not present

## 2022-08-09 ENCOUNTER — Other Ambulatory Visit: Payer: Self-pay | Admitting: *Deleted

## 2022-08-09 MED ORDER — METOPROLOL SUCCINATE ER 50 MG PO TB24: 50.0000 mg | ORAL_TABLET | Freq: Every day | ORAL | 3 refills | Status: DC

## 2022-08-14 ENCOUNTER — Encounter: Payer: Self-pay | Admitting: Cardiology

## 2022-08-14 DIAGNOSIS — R079 Chest pain, unspecified: Secondary | ICD-10-CM | POA: Diagnosis not present

## 2022-08-14 DIAGNOSIS — E1065 Type 1 diabetes mellitus with hyperglycemia: Secondary | ICD-10-CM | POA: Diagnosis not present

## 2022-08-14 DIAGNOSIS — E785 Hyperlipidemia, unspecified: Secondary | ICD-10-CM | POA: Diagnosis not present

## 2022-08-14 DIAGNOSIS — Z7901 Long term (current) use of anticoagulants: Secondary | ICD-10-CM | POA: Diagnosis not present

## 2022-08-14 DIAGNOSIS — E079 Disorder of thyroid, unspecified: Secondary | ICD-10-CM | POA: Diagnosis not present

## 2022-08-14 DIAGNOSIS — I4891 Unspecified atrial fibrillation: Secondary | ICD-10-CM | POA: Diagnosis not present

## 2022-08-14 DIAGNOSIS — R410 Disorientation, unspecified: Secondary | ICD-10-CM | POA: Diagnosis not present

## 2022-08-14 DIAGNOSIS — Z9641 Presence of insulin pump (external) (internal): Secondary | ICD-10-CM | POA: Diagnosis not present

## 2022-08-14 DIAGNOSIS — I471 Supraventricular tachycardia, unspecified: Secondary | ICD-10-CM | POA: Diagnosis not present

## 2022-08-14 DIAGNOSIS — I5022 Chronic systolic (congestive) heart failure: Secondary | ICD-10-CM | POA: Diagnosis not present

## 2022-08-14 DIAGNOSIS — R001 Bradycardia, unspecified: Secondary | ICD-10-CM | POA: Diagnosis not present

## 2022-08-14 DIAGNOSIS — R072 Precordial pain: Secondary | ICD-10-CM | POA: Diagnosis not present

## 2022-08-14 DIAGNOSIS — R110 Nausea: Secondary | ICD-10-CM | POA: Diagnosis not present

## 2022-08-14 DIAGNOSIS — Z794 Long term (current) use of insulin: Secondary | ICD-10-CM | POA: Diagnosis not present

## 2022-08-14 DIAGNOSIS — I110 Hypertensive heart disease with heart failure: Secondary | ICD-10-CM | POA: Diagnosis not present

## 2022-08-14 DIAGNOSIS — Z87891 Personal history of nicotine dependence: Secondary | ICD-10-CM | POA: Diagnosis not present

## 2022-08-14 DIAGNOSIS — R531 Weakness: Secondary | ICD-10-CM | POA: Diagnosis not present

## 2022-08-14 DIAGNOSIS — Z8673 Personal history of transient ischemic attack (TIA), and cerebral infarction without residual deficits: Secondary | ICD-10-CM | POA: Diagnosis not present

## 2022-08-14 DIAGNOSIS — R0789 Other chest pain: Secondary | ICD-10-CM | POA: Diagnosis not present

## 2022-08-14 DIAGNOSIS — I700 Atherosclerosis of aorta: Secondary | ICD-10-CM | POA: Diagnosis not present

## 2022-08-15 DIAGNOSIS — I4819 Other persistent atrial fibrillation: Secondary | ICD-10-CM | POA: Diagnosis not present

## 2022-08-16 ENCOUNTER — Ambulatory Visit (INDEPENDENT_AMBULATORY_CARE_PROVIDER_SITE_OTHER): Payer: Medicare Other | Admitting: Family Medicine

## 2022-08-16 ENCOUNTER — Encounter: Payer: Self-pay | Admitting: Family Medicine

## 2022-08-16 VITALS — BP 163/79 | HR 62 | Ht 65.0 in | Wt 133.0 lb

## 2022-08-16 DIAGNOSIS — I700 Atherosclerosis of aorta: Secondary | ICD-10-CM | POA: Diagnosis not present

## 2022-08-16 DIAGNOSIS — E031 Congenital hypothyroidism without goiter: Secondary | ICD-10-CM | POA: Diagnosis not present

## 2022-08-16 DIAGNOSIS — I4819 Other persistent atrial fibrillation: Secondary | ICD-10-CM | POA: Diagnosis not present

## 2022-08-16 DIAGNOSIS — E1039 Type 1 diabetes mellitus with other diabetic ophthalmic complication: Secondary | ICD-10-CM | POA: Diagnosis not present

## 2022-08-16 DIAGNOSIS — I152 Hypertension secondary to endocrine disorders: Secondary | ICD-10-CM

## 2022-08-16 DIAGNOSIS — E1159 Type 2 diabetes mellitus with other circulatory complications: Secondary | ICD-10-CM

## 2022-08-16 DIAGNOSIS — D6869 Other thrombophilia: Secondary | ICD-10-CM

## 2022-08-17 NOTE — Assessment & Plan Note (Signed)
Manage risk factors including cholesterol and hypertension.

## 2022-08-17 NOTE — Assessment & Plan Note (Signed)
This is also managed by endocrinology.  Recently switched insulin pump tolerating well.Carmen Cooper

## 2022-08-17 NOTE — Assessment & Plan Note (Signed)
She will remain on apixaban for risk reduction of stroke due to elevated CHA2DS2-VASc score

## 2022-08-17 NOTE — Assessment & Plan Note (Signed)
Her blood pressure is elevated however I would not make any changes to her antihypertensive regimen at this time until we see what changes her electrophysiologist may you want to make based on data from her recent Holter.

## 2022-08-17 NOTE — Progress Notes (Signed)
Carmen Cooper - 82 y.o. female MRN [id number]  Date of birth: 26-Sep-1940  Subjective Chief Complaint  Patient presents with   Hypertension    HPI Carmen Cooper is a 82 y.o. female here today for follow up visit.   She was having symptomatic atrial fibrillation at last visit with me.  We got her with her cardiologist and she did undergo cardioversion.  Since then she has continued to not feel well.  She has had some episodes of low heart rate as well as some episodes of SVT.  Recently completed a Zio patch but is awaiting this to be read by Dr. Elberta Fortis.  She does also continue to see her endocrinologist and recently had her pump switched out from an OmniPod to iLet bionic pancreas pump.  She just recently started using this but seems to like it so far.  Her TSH was significantly elevated and Dr. Shawnee Knapp adjusted her Tirosint hoping for better absorption.  ROS:  A comprehensive ROS was completed and negative except as noted per HPI    Past Medical History:  Diagnosis Date   Atrial fibrillation (HCC)    BCC (basal cell carcinoma of skin)    Diabetes (HCC)    Glaucoma    History of TIA (transient ischemic attack) 08/11/2013   12/2012 - Dr. Fara Olden    Hypertension    Hypothyroidism 08/11/2013   Memory changes    Microscopic colitis 08/21/2013   2008 - [facility] Dr. Myra Gianotti.  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 Loraine Grip, [facility]     Past Surgical History:  Procedure Laterality Date   BREAST EXCISIONAL BIOPSY Left    BREAST EXCISIONAL BIOPSY Left    CARDIOVERSION N/A 02/12/2019   Procedure: CARDIOVERSION;  Surgeon: Chrystie Nose, MD;  Location: [facility] ENDOSCOPY;  Service: Cardiovascular;  Laterality: N/A;   CARDIOVERSION N/A 07/20/2022   Procedure: CARDIOVERSION;  Surgeon: Jake Bathe, MD;  Location: [facility] INVASIVE CV LAB;  Service: Cardiovascular;  Laterality: N/A;   MOHS SURGERY  2019   Nose bcc    OTHER  SURGICAL HISTORY  04/01/2019   biopsy on nose and lip     Social History   Socioeconomic History   Marital status: Widowed    Spouse name: [name]   Number of children: 1   Years of education: 12   Highest education level: 12th grade  Occupational History   Occupation: Retired    Comment: retired  Tobacco Use   Smoking status: Former    Packs/day: 0.25    Years: 20.00    Additional pack years: 0.00    Total pack years: 5.00    Types: Cigarettes   Smokeless tobacco: Never  Vaping Use   Vaping Use: Never used  Substance and Sexual Activity   Alcohol use: Not Currently   Drug use: No   Sexual activity: Not Currently    Partners: Male  Other Topics Concern   Not on file  Social History Narrative   Lives alone. She has one son, who takes her to her doctor's appointments. She enjoys crochet and gardening.   Social Determinants of Health   Financial Resource Strain: Low Risk  (03/17/2022)   Overall Financial Resource Strain (CARDIA)    Difficulty of Paying Living Expenses: Not hard at all  Food Insecurity: No Food Insecurity (06/12/2022)   Hunger Vital Sign    Worried About Running Out of  Food in the Last Year: Never true    Ran Out of Food in the Last Year: Never true  Transportation Needs: No Transportation Needs (06/12/2022)   PRAPARE - Administrator, Civil Service (Medical): No    Lack of Transportation (Non-Medical): No  Physical Activity: Sufficiently Active (03/17/2022)   Exercise Vital Sign    Days of Exercise per Week: 4 days    Minutes of Exercise per Session: 40 min  Stress: No Stress Concern Present (03/17/2022)   [employer] of Occupational Health - Occupational Stress Questionnaire    Feeling of Stress : Not at all  Social Connections: Socially Isolated (03/17/2022)   Social Connection and Isolation Panel [NHANES]    Frequency of Communication with Friends and Family: More than three times a week    Frequency of Social Gatherings with Friends and  Family: More than three times a week    Attends Religious Services: Never    Database administrator or Organizations: No    Attends Banker Meetings: Never    Marital Status: Widowed    Family History  Problem Relation Age of Onset   Heart disease Son    Hypertension Mother    Cancer Mother        unsure of origin   Heart attack Father    Diabetes Neg Hx     Health Maintenance  Topic Date Due   Diabetic kidney evaluation - Urine ACR  11/11/2022 (Originally 02/11/2015)   FOOT EXAM  11/11/2022 (Originally 03/09/2022)   OPHTHALMOLOGY EXAM  11/11/2022 (Originally 07/21/2021)   COVID-19 Vaccine (7 - 2023-24 season) 11/11/2022 (Originally 01/10/2022)   Zoster Vaccines- Shingrix (2 of 2) 11/11/2022 (Originally 05/21/2021)   HEMOGLOBIN A1C  08/25/2022   INFLUENZA VACCINE  09/14/2022   Medicare Annual Wellness (AWV)  03/18/2023   Diabetic kidney evaluation - eGFR measurement  06/22/2023   DTaP/Tdap/Td (3 - Td or Tdap) 06/26/2031   Pneumonia Vaccine 81+ Years old  Completed   DEXA SCAN  Completed   HPV VACCINES  Aged Out     ----------------------------------------------------------------------------------------------------------------------------------------------------------------------------------------------------------------- Physical Exam BP (!) 163/79 (BP Location: Right Arm, Patient Position: Sitting, Cuff Size: Normal)   Pulse 62   Ht 5\' 5"  (1.651 m)   Wt 133 lb (60.3 kg)   SpO2 98%   BMI 22.13 kg/m   Physical Exam Constitutional:      Appearance: Normal appearance.  HENT:     Head: Normocephalic and atraumatic.  Eyes:     General: No scleral icterus. Cardiovascular:     Rate and Rhythm: Bradycardia present.  Pulmonary:     Effort: Pulmonary effort is normal.     Breath sounds: Normal breath sounds.  Neurological:     General: No focal deficit present.     Mental Status: She is alert.  Psychiatric:        Mood and Affect: Mood normal.         Behavior: Behavior normal.     ------------------------------------------------------------------------------------------------------------------------------------------------------------------------------------------------------------------- Assessment and Plan  Hypertension associated with diabetes (HCC) Her blood pressure is elevated however I would not make any changes to her antihypertensive regimen at this time until we see what changes her electrophysiologist may you want to make based on data from her recent Holter.  Hypercoagulable state due to persistent atrial fibrillation (HCC) She will remain on apixaban for risk reduction of stroke due to elevated CHA2DS2-VASc score  Aortic atherosclerosis (HCC) Manage risk factors including cholesterol and hypertension.  Hypothyroidism Management  per endocrinology.  She will continue on current regimen of Tirosint.  Type 1 diabetes (HCC) This is also managed by endocrinology.  Recently switched insulin pump tolerating well..   No orders of the defined types were placed in this encounter.   Return in about 6 months (around 02/16/2023) for HTN.    This visit occurred during the SARS-CoV-2 public health emergency.  Safety protocols were in place, including screening questions prior to the visit, additional usage of staff PPE, and extensive cleaning of exam room while observing appropriate contact time as indicated for disinfecting solutions.

## 2022-08-17 NOTE — Assessment & Plan Note (Signed)
Management per endocrinology.  She will continue on current regimen of Tirosint.

## 2022-08-23 ENCOUNTER — Encounter (INDEPENDENT_AMBULATORY_CARE_PROVIDER_SITE_OTHER): Payer: Medicare Other | Admitting: Family Medicine

## 2022-08-23 DIAGNOSIS — R197 Diarrhea, unspecified: Secondary | ICD-10-CM | POA: Diagnosis not present

## 2022-08-23 MED ORDER — DIPHENOXYLATE-ATROPINE 2.5-0.025 MG PO TABS: 1.0000 | ORAL_TABLET | Freq: Four times a day (QID) | ORAL | 0 refills | Status: DC | PRN

## 2022-08-23 NOTE — Telephone Encounter (Signed)
Please see the MyChart message reply(ies) for my assessment and plan.    This patient gave consent for this Medical Advice Message and is aware that it may result in a bill to their insurance company, as well as the possibility of receiving a bill for a co-payment or deductible. They are an established patient, but are not seeking medical advice exclusively about a problem treated during an in person or video visit in the last seven days. I did not recommend an in person or video visit within seven days of my reply.    I spent a total of 6 minutes cumulative time within 7 days through MyChart messaging.  Meckenzie Balsley, DO   

## 2022-08-29 DIAGNOSIS — R1084 Generalized abdominal pain: Secondary | ICD-10-CM | POA: Diagnosis not present

## 2022-08-29 DIAGNOSIS — I700 Atherosclerosis of aorta: Secondary | ICD-10-CM | POA: Diagnosis not present

## 2022-08-29 DIAGNOSIS — Z8673 Personal history of transient ischemic attack (TIA), and cerebral infarction without residual deficits: Secondary | ICD-10-CM | POA: Diagnosis not present

## 2022-08-29 DIAGNOSIS — R1032 Left lower quadrant pain: Secondary | ICD-10-CM | POA: Diagnosis not present

## 2022-08-29 DIAGNOSIS — Z87891 Personal history of nicotine dependence: Secondary | ICD-10-CM | POA: Diagnosis not present

## 2022-08-29 DIAGNOSIS — I5023 Acute on chronic systolic (congestive) heart failure: Secondary | ICD-10-CM | POA: Diagnosis not present

## 2022-08-29 DIAGNOSIS — I4891 Unspecified atrial fibrillation: Secondary | ICD-10-CM | POA: Diagnosis not present

## 2022-08-29 DIAGNOSIS — R1013 Epigastric pain: Secondary | ICD-10-CM | POA: Diagnosis not present

## 2022-08-29 DIAGNOSIS — Z882 Allergy status to sulfonamides status: Secondary | ICD-10-CM | POA: Diagnosis not present

## 2022-08-29 DIAGNOSIS — Z794 Long term (current) use of insulin: Secondary | ICD-10-CM | POA: Diagnosis not present

## 2022-08-29 DIAGNOSIS — R1031 Right lower quadrant pain: Secondary | ICD-10-CM | POA: Diagnosis not present

## 2022-08-29 DIAGNOSIS — Z9641 Presence of insulin pump (external) (internal): Secondary | ICD-10-CM | POA: Diagnosis not present

## 2022-08-29 DIAGNOSIS — N854 Malposition of uterus: Secondary | ICD-10-CM | POA: Diagnosis not present

## 2022-08-29 DIAGNOSIS — I110 Hypertensive heart disease with heart failure: Secondary | ICD-10-CM | POA: Diagnosis not present

## 2022-08-29 DIAGNOSIS — R197 Diarrhea, unspecified: Secondary | ICD-10-CM | POA: Diagnosis not present

## 2022-08-29 DIAGNOSIS — N2889 Other specified disorders of kidney and ureter: Secondary | ICD-10-CM | POA: Diagnosis not present

## 2022-08-29 DIAGNOSIS — Z79899 Other long term (current) drug therapy: Secondary | ICD-10-CM | POA: Diagnosis not present

## 2022-08-29 DIAGNOSIS — E039 Hypothyroidism, unspecified: Secondary | ICD-10-CM | POA: Diagnosis not present

## 2022-08-29 DIAGNOSIS — Z7901 Long term (current) use of anticoagulants: Secondary | ICD-10-CM | POA: Diagnosis not present

## 2022-08-29 DIAGNOSIS — K5730 Diverticulosis of large intestine without perforation or abscess without bleeding: Secondary | ICD-10-CM | POA: Diagnosis not present

## 2022-08-29 DIAGNOSIS — E785 Hyperlipidemia, unspecified: Secondary | ICD-10-CM | POA: Diagnosis not present

## 2022-08-29 DIAGNOSIS — E109 Type 1 diabetes mellitus without complications: Secondary | ICD-10-CM | POA: Diagnosis not present

## 2022-09-05 ENCOUNTER — Other Ambulatory Visit: Payer: Self-pay | Admitting: Family Medicine

## 2022-09-05 ENCOUNTER — Other Ambulatory Visit: Payer: Self-pay | Admitting: Cardiology

## 2022-09-05 DIAGNOSIS — I10 Essential (primary) hypertension: Secondary | ICD-10-CM | POA: Diagnosis not present

## 2022-09-05 DIAGNOSIS — Z87891 Personal history of nicotine dependence: Secondary | ICD-10-CM | POA: Diagnosis not present

## 2022-09-05 DIAGNOSIS — R002 Palpitations: Secondary | ICD-10-CM

## 2022-09-05 DIAGNOSIS — R197 Diarrhea, unspecified: Secondary | ICD-10-CM | POA: Diagnosis not present

## 2022-09-05 DIAGNOSIS — Z8679 Personal history of other diseases of the circulatory system: Secondary | ICD-10-CM | POA: Diagnosis not present

## 2022-09-05 DIAGNOSIS — Z8673 Personal history of transient ischemic attack (TIA), and cerebral infarction without residual deficits: Secondary | ICD-10-CM | POA: Diagnosis not present

## 2022-09-05 DIAGNOSIS — E785 Hyperlipidemia, unspecified: Secondary | ICD-10-CM | POA: Diagnosis not present

## 2022-09-05 DIAGNOSIS — Z888 Allergy status to other drugs, medicaments and biological substances status: Secondary | ICD-10-CM | POA: Diagnosis not present

## 2022-09-05 DIAGNOSIS — Z794 Long term (current) use of insulin: Secondary | ICD-10-CM | POA: Diagnosis not present

## 2022-09-05 DIAGNOSIS — R001 Bradycardia, unspecified: Secondary | ICD-10-CM | POA: Diagnosis not present

## 2022-09-05 DIAGNOSIS — Z859 Personal history of malignant neoplasm, unspecified: Secondary | ICD-10-CM | POA: Diagnosis not present

## 2022-09-05 DIAGNOSIS — Z79899 Other long term (current) drug therapy: Secondary | ICD-10-CM | POA: Diagnosis not present

## 2022-09-05 DIAGNOSIS — R5383 Other fatigue: Secondary | ICD-10-CM | POA: Diagnosis not present

## 2022-09-05 DIAGNOSIS — Z7901 Long term (current) use of anticoagulants: Secondary | ICD-10-CM | POA: Diagnosis not present

## 2022-09-05 DIAGNOSIS — Z882 Allergy status to sulfonamides status: Secondary | ICD-10-CM | POA: Diagnosis not present

## 2022-09-05 DIAGNOSIS — E1165 Type 2 diabetes mellitus with hyperglycemia: Secondary | ICD-10-CM | POA: Diagnosis not present

## 2022-09-07 DIAGNOSIS — E1065 Type 1 diabetes mellitus with hyperglycemia: Secondary | ICD-10-CM | POA: Diagnosis not present

## 2022-09-07 DIAGNOSIS — E1021 Type 1 diabetes mellitus with diabetic nephropathy: Secondary | ICD-10-CM | POA: Diagnosis not present

## 2022-09-12 DIAGNOSIS — R935 Abnormal findings on diagnostic imaging of other abdominal regions, including retroperitoneum: Secondary | ICD-10-CM | POA: Diagnosis not present

## 2022-09-12 DIAGNOSIS — R1084 Generalized abdominal pain: Secondary | ICD-10-CM | POA: Diagnosis not present

## 2022-09-12 DIAGNOSIS — A045 Campylobacter enteritis: Secondary | ICD-10-CM | POA: Diagnosis not present

## 2022-09-12 DIAGNOSIS — R1310 Dysphagia, unspecified: Secondary | ICD-10-CM | POA: Diagnosis not present

## 2022-09-12 DIAGNOSIS — R197 Diarrhea, unspecified: Secondary | ICD-10-CM | POA: Diagnosis not present

## 2022-09-18 ENCOUNTER — Encounter: Payer: Self-pay | Admitting: Cardiology

## 2022-09-18 ENCOUNTER — Ambulatory Visit: Payer: Medicare Other | Attending: Cardiology | Admitting: Cardiology

## 2022-09-18 VITALS — BP 112/62 | HR 70 | Ht 65.0 in | Wt 129.8 lb

## 2022-09-18 DIAGNOSIS — I4819 Other persistent atrial fibrillation: Secondary | ICD-10-CM | POA: Diagnosis not present

## 2022-09-18 DIAGNOSIS — I4891 Unspecified atrial fibrillation: Secondary | ICD-10-CM | POA: Diagnosis not present

## 2022-09-18 DIAGNOSIS — I5022 Chronic systolic (congestive) heart failure: Secondary | ICD-10-CM | POA: Insufficient documentation

## 2022-09-18 DIAGNOSIS — D6869 Other thrombophilia: Secondary | ICD-10-CM | POA: Diagnosis not present

## 2022-09-18 DIAGNOSIS — R1310 Dysphagia, unspecified: Secondary | ICD-10-CM | POA: Diagnosis not present

## 2022-09-18 DIAGNOSIS — I493 Ventricular premature depolarization: Secondary | ICD-10-CM | POA: Insufficient documentation

## 2022-09-18 DIAGNOSIS — R197 Diarrhea, unspecified: Secondary | ICD-10-CM | POA: Diagnosis not present

## 2022-09-18 DIAGNOSIS — K2210 Ulcer of esophagus without bleeding: Secondary | ICD-10-CM | POA: Diagnosis not present

## 2022-09-18 DIAGNOSIS — K5730 Diverticulosis of large intestine without perforation or abscess without bleeding: Secondary | ICD-10-CM | POA: Diagnosis not present

## 2022-09-18 DIAGNOSIS — K6389 Other specified diseases of intestine: Secondary | ICD-10-CM | POA: Diagnosis not present

## 2022-09-18 DIAGNOSIS — K2289 Other specified disease of esophagus: Secondary | ICD-10-CM | POA: Diagnosis not present

## 2022-09-18 MED ORDER — DILTIAZEM HCL ER COATED BEADS 240 MG PO CP24: 240.0000 mg | ORAL_CAPSULE | Freq: Every day | ORAL | 6 refills | Status: DC

## 2022-09-18 NOTE — Patient Instructions (Addendum)
Medication Instructions:  Your physician has recommended you make the following change in your medication:  STOP Metoprolol STOP Diltiazem 60 mg twice a day START Diltiazem to 240 mg once daily  *If you need a refill on your cardiac medications before your next appointment, please call your pharmacy*   Lab Work: None ordered   Testing/Procedures: None ordered   Follow-Up: At [facility], you and your health needs are our priority.  As part of our continuing mission to provide you with exceptional heart care, we have created designated Provider Care Teams.  These Care Teams include your primary Cardiologist (physician) and Advanced Practice Providers (APPs -  Physician Assistants and Nurse Practitioners) who all work together to provide you with the care you need, when you need it.  Your next appointment:   6 month(s)  The format for your next appointment:   In Person  Provider:   Loman Brooklyn, MD    Thank you for choosing [facility]!!   Dory Horn, RN [phone]

## 2022-09-18 NOTE — Addendum Note (Signed)
Addended by: Baird Lyons on: 09/18/2022 04:24 PM   Modules accepted: Orders

## 2022-09-18 NOTE — Progress Notes (Signed)
  Electrophysiology Office Note:   Date:  09/18/2022  ID:  Orie Brenning, DOB Sep 26, 1940, MRN [id number]  Primary Cardiologist:  Jorja Loa, MD Electrophysiologist: None      History of Present Illness:   Carmen Cooper is a 82 y.o. female with h/o atrial fibrillation, PVCs seen today for routine electrophysiology followup.  Since last being seen in our clinic the patient reports increasing palpitations.  She was noted to be in atrial fibrillation recently and had a cardioversion.  Since her cardioversion, she feels that she has been in sinus rhythm, but does note continued palpitations through the day.  She wore a cardiac monitor that showed a 2.9% PVC burden and short runs of SVT, longest 12 beats.  she denies chest pain, dyspnea, PND, orthopnea, nausea, vomiting, dizziness, syncope, edema, weight gain, or early satiety.      The history significant hypertension, aortic atherosclerosis, diabetes, CKD. She developed chest pain and shortness of breath and was found to be in atrial fibrillation by her primary physician. Ejection fraction was 40 to 45% at the time.  Her ejection fraction has since improved.      Review of systems complete and found to be negative unless listed in HPI.   EP Information / Studies Reviewed:    EKG is not ordered today. EKG from 08/01/22 reviewed which showed sinus rhythm        Risk Assessment/Calculations:    CHA2DS2-VASc Score = 9   This indicates a 12.2% annual risk of stroke. The patient's score is based upon: CHF History: 1 HTN History: 1 Diabetes History: 1 Stroke History: 2 Vascular Disease History: 1 Age Score: 2 Gender Score: 1             Physical Exam:   VS:  BP 112/62   Pulse 70   Ht 5\' 5"  (1.651 m)   Wt 129 lb 12.8 oz (58.9 kg)   SpO2 98%   BMI 21.60 kg/m    Wt Readings from Last 3 Encounters:  09/18/22 129 lb 12.8 oz (58.9 kg)  08/16/22 133 lb (60.3 kg)  08/01/22 133 lb 6.4 oz (60.5 kg)     GEN: Well nourished, well  developed in no acute distress NECK: No JVD; No carotid bruits CARDIAC: Regular rate and rhythm with occasional ectopy, no murmurs, rubs, gallops RESPIRATORY:  Clear to auscultation without rales, wheezing or rhonchi  ABDOMEN: Soft, non-tender, non-distended EXTREMITIES:  No edema; No deformity   ASSESSMENT AND PLAN:    1.  Persistent atrial fibrillation: Currently on Toprol-XL and Eliquis.  She had a recent cardioversion and is in sinus rhythm.  She is feeling well currently.  She is happy to continue with current management.  No changes.  2.  PVCs: 2.7% noted on cardiac monitor.  She feels significant palpitations.  She is also somewhat fatigued.   stop metoprolol and increase her diltiazem to 240 mg daily.  3.  Chronic systolic heart failure: Ejection fraction 40 to 45%.  It is since recovered.  No changes.  4.  Secondary hypercoagulable state: Currently on Eliquis for atrial fibrillation  Follow up with Dr. Elberta Fortis in 6 months  Signed,  Jorja Loa, MD

## 2022-09-25 DIAGNOSIS — R1319 Other dysphagia: Secondary | ICD-10-CM | POA: Diagnosis not present

## 2022-09-25 DIAGNOSIS — R1310 Dysphagia, unspecified: Secondary | ICD-10-CM | POA: Diagnosis not present

## 2022-10-09 DIAGNOSIS — E039 Hypothyroidism, unspecified: Secondary | ICD-10-CM | POA: Diagnosis not present

## 2022-10-17 ENCOUNTER — Other Ambulatory Visit: Payer: Self-pay | Admitting: Cardiology

## 2022-11-01 ENCOUNTER — Other Ambulatory Visit: Payer: Self-pay

## 2022-11-01 DIAGNOSIS — I700 Atherosclerosis of aorta: Secondary | ICD-10-CM

## 2022-11-01 MED ORDER — DILTIAZEM HCL ER COATED BEADS 240 MG PO CP24: 240.0000 mg | ORAL_CAPSULE | Freq: Every day | ORAL | 3 refills | Status: DC

## 2022-11-01 MED ORDER — ATORVASTATIN CALCIUM 40 MG PO TABS
40.0000 mg | ORAL_TABLET | Freq: Every day | ORAL | 3 refills | Status: DC
Start: 2022-11-01 — End: 2023-10-29

## 2022-11-07 DIAGNOSIS — E1065 Type 1 diabetes mellitus with hyperglycemia: Secondary | ICD-10-CM | POA: Diagnosis not present

## 2022-11-07 DIAGNOSIS — Z9641 Presence of insulin pump (external) (internal): Secondary | ICD-10-CM | POA: Diagnosis not present

## 2022-11-07 DIAGNOSIS — Z978 Presence of other specified devices: Secondary | ICD-10-CM | POA: Diagnosis not present

## 2022-11-09 DIAGNOSIS — H353221 Exudative age-related macular degeneration, left eye, with active choroidal neovascularization: Secondary | ICD-10-CM | POA: Diagnosis not present

## 2022-11-09 DIAGNOSIS — H401120 Primary open-angle glaucoma, left eye, stage unspecified: Secondary | ICD-10-CM | POA: Diagnosis not present

## 2022-11-09 DIAGNOSIS — H209 Unspecified iridocyclitis: Secondary | ICD-10-CM | POA: Diagnosis not present

## 2022-11-09 DIAGNOSIS — Z79899 Other long term (current) drug therapy: Secondary | ICD-10-CM | POA: Diagnosis not present

## 2022-11-09 DIAGNOSIS — H4041X1 Glaucoma secondary to eye inflammation, right eye, mild stage: Secondary | ICD-10-CM | POA: Diagnosis not present

## 2022-11-09 DIAGNOSIS — H3581 Retinal edema: Secondary | ICD-10-CM | POA: Diagnosis not present

## 2022-11-09 DIAGNOSIS — H44111 Panuveitis, right eye: Secondary | ICD-10-CM | POA: Diagnosis not present

## 2022-11-09 DIAGNOSIS — H353212 Exudative age-related macular degeneration, right eye, with inactive choroidal neovascularization: Secondary | ICD-10-CM | POA: Diagnosis not present

## 2022-11-09 DIAGNOSIS — H30031 Focal chorioretinal inflammation, peripheral, right eye: Secondary | ICD-10-CM | POA: Diagnosis not present

## 2022-11-09 DIAGNOSIS — Z961 Presence of intraocular lens: Secondary | ICD-10-CM | POA: Diagnosis not present

## 2022-11-22 DIAGNOSIS — Z9889 Other specified postprocedural states: Secondary | ICD-10-CM | POA: Diagnosis not present

## 2022-11-22 DIAGNOSIS — Z9641 Presence of insulin pump (external) (internal): Secondary | ICD-10-CM | POA: Diagnosis present

## 2022-11-22 DIAGNOSIS — I9589 Other hypotension: Secondary | ICD-10-CM | POA: Diagnosis present

## 2022-11-22 DIAGNOSIS — Z8669 Personal history of other diseases of the nervous system and sense organs: Secondary | ICD-10-CM | POA: Diagnosis not present

## 2022-11-22 DIAGNOSIS — Z882 Allergy status to sulfonamides status: Secondary | ICD-10-CM | POA: Diagnosis not present

## 2022-11-22 DIAGNOSIS — R0789 Other chest pain: Secondary | ICD-10-CM | POA: Diagnosis not present

## 2022-11-22 DIAGNOSIS — Z8673 Personal history of transient ischemic attack (TIA), and cerebral infarction without residual deficits: Secondary | ICD-10-CM | POA: Diagnosis not present

## 2022-11-22 DIAGNOSIS — E039 Hypothyroidism, unspecified: Secondary | ICD-10-CM | POA: Diagnosis present

## 2022-11-22 DIAGNOSIS — Z7901 Long term (current) use of anticoagulants: Secondary | ICD-10-CM | POA: Diagnosis not present

## 2022-11-22 DIAGNOSIS — I77810 Thoracic aortic ectasia: Secondary | ICD-10-CM | POA: Diagnosis not present

## 2022-11-22 DIAGNOSIS — Z66 Do not resuscitate: Secondary | ICD-10-CM | POA: Diagnosis present

## 2022-11-22 DIAGNOSIS — R7989 Other specified abnormal findings of blood chemistry: Secondary | ICD-10-CM | POA: Diagnosis not present

## 2022-11-22 DIAGNOSIS — R739 Hyperglycemia, unspecified: Secondary | ICD-10-CM | POA: Diagnosis not present

## 2022-11-22 DIAGNOSIS — E1165 Type 2 diabetes mellitus with hyperglycemia: Secondary | ICD-10-CM | POA: Diagnosis not present

## 2022-11-22 DIAGNOSIS — Z7952 Long term (current) use of systemic steroids: Secondary | ICD-10-CM | POA: Diagnosis not present

## 2022-11-22 DIAGNOSIS — E785 Hyperlipidemia, unspecified: Secondary | ICD-10-CM | POA: Diagnosis not present

## 2022-11-22 DIAGNOSIS — Z8249 Family history of ischemic heart disease and other diseases of the circulatory system: Secondary | ICD-10-CM | POA: Diagnosis not present

## 2022-11-22 DIAGNOSIS — I110 Hypertensive heart disease with heart failure: Secondary | ICD-10-CM | POA: Diagnosis present

## 2022-11-22 DIAGNOSIS — Z888 Allergy status to other drugs, medicaments and biological substances status: Secondary | ICD-10-CM | POA: Diagnosis not present

## 2022-11-22 DIAGNOSIS — H409 Unspecified glaucoma: Secondary | ICD-10-CM | POA: Diagnosis present

## 2022-11-22 DIAGNOSIS — R079 Chest pain, unspecified: Secondary | ICD-10-CM | POA: Diagnosis not present

## 2022-11-22 DIAGNOSIS — I480 Paroxysmal atrial fibrillation: Secondary | ICD-10-CM | POA: Diagnosis present

## 2022-11-22 DIAGNOSIS — Q248 Other specified congenital malformations of heart: Secondary | ICD-10-CM | POA: Diagnosis not present

## 2022-11-22 DIAGNOSIS — E1010 Type 1 diabetes mellitus with ketoacidosis without coma: Secondary | ICD-10-CM | POA: Diagnosis present

## 2022-11-22 DIAGNOSIS — I959 Hypotension, unspecified: Secondary | ICD-10-CM | POA: Diagnosis not present

## 2022-11-22 DIAGNOSIS — R918 Other nonspecific abnormal finding of lung field: Secondary | ICD-10-CM | POA: Diagnosis not present

## 2022-11-22 DIAGNOSIS — E1065 Type 1 diabetes mellitus with hyperglycemia: Secondary | ICD-10-CM | POA: Diagnosis not present

## 2022-11-22 DIAGNOSIS — I517 Cardiomegaly: Secondary | ICD-10-CM | POA: Diagnosis not present

## 2022-11-22 DIAGNOSIS — Z881 Allergy status to other antibiotic agents status: Secondary | ICD-10-CM | POA: Diagnosis not present

## 2022-11-22 DIAGNOSIS — E860 Dehydration: Secondary | ICD-10-CM | POA: Diagnosis present

## 2022-11-22 DIAGNOSIS — Z79899 Other long term (current) drug therapy: Secondary | ICD-10-CM | POA: Diagnosis not present

## 2022-11-22 DIAGNOSIS — Z794 Long term (current) use of insulin: Secondary | ICD-10-CM | POA: Diagnosis not present

## 2022-11-22 DIAGNOSIS — I5022 Chronic systolic (congestive) heart failure: Secondary | ICD-10-CM | POA: Diagnosis present

## 2022-11-22 DIAGNOSIS — I080 Rheumatic disorders of both mitral and aortic valves: Secondary | ICD-10-CM | POA: Diagnosis not present

## 2022-11-22 DIAGNOSIS — Z87891 Personal history of nicotine dependence: Secondary | ICD-10-CM | POA: Diagnosis not present

## 2022-11-22 DIAGNOSIS — R911 Solitary pulmonary nodule: Secondary | ICD-10-CM | POA: Diagnosis not present

## 2022-11-23 DIAGNOSIS — I5022 Chronic systolic (congestive) heart failure: Secondary | ICD-10-CM | POA: Diagnosis present

## 2022-11-23 DIAGNOSIS — Z9889 Other specified postprocedural states: Secondary | ICD-10-CM | POA: Diagnosis not present

## 2022-11-23 DIAGNOSIS — E785 Hyperlipidemia, unspecified: Secondary | ICD-10-CM | POA: Diagnosis present

## 2022-11-23 DIAGNOSIS — I959 Hypotension, unspecified: Secondary | ICD-10-CM | POA: Diagnosis not present

## 2022-11-23 DIAGNOSIS — E039 Hypothyroidism, unspecified: Secondary | ICD-10-CM | POA: Diagnosis present

## 2022-11-23 DIAGNOSIS — H409 Unspecified glaucoma: Secondary | ICD-10-CM | POA: Diagnosis present

## 2022-11-23 DIAGNOSIS — I517 Cardiomegaly: Secondary | ICD-10-CM | POA: Diagnosis not present

## 2022-11-23 DIAGNOSIS — I9589 Other hypotension: Secondary | ICD-10-CM | POA: Diagnosis present

## 2022-11-23 DIAGNOSIS — Z8673 Personal history of transient ischemic attack (TIA), and cerebral infarction without residual deficits: Secondary | ICD-10-CM | POA: Diagnosis not present

## 2022-11-23 DIAGNOSIS — Z9641 Presence of insulin pump (external) (internal): Secondary | ICD-10-CM | POA: Diagnosis present

## 2022-11-23 DIAGNOSIS — Z794 Long term (current) use of insulin: Secondary | ICD-10-CM | POA: Diagnosis not present

## 2022-11-23 DIAGNOSIS — Z882 Allergy status to sulfonamides status: Secondary | ICD-10-CM | POA: Diagnosis not present

## 2022-11-23 DIAGNOSIS — Z8249 Family history of ischemic heart disease and other diseases of the circulatory system: Secondary | ICD-10-CM | POA: Diagnosis not present

## 2022-11-23 DIAGNOSIS — E1065 Type 1 diabetes mellitus with hyperglycemia: Secondary | ICD-10-CM | POA: Diagnosis not present

## 2022-11-23 DIAGNOSIS — E1010 Type 1 diabetes mellitus with ketoacidosis without coma: Secondary | ICD-10-CM | POA: Diagnosis present

## 2022-11-23 DIAGNOSIS — Q248 Other specified congenital malformations of heart: Secondary | ICD-10-CM | POA: Diagnosis not present

## 2022-11-23 DIAGNOSIS — Z8669 Personal history of other diseases of the nervous system and sense organs: Secondary | ICD-10-CM | POA: Diagnosis not present

## 2022-11-23 DIAGNOSIS — Z66 Do not resuscitate: Secondary | ICD-10-CM | POA: Diagnosis present

## 2022-11-23 DIAGNOSIS — I110 Hypertensive heart disease with heart failure: Secondary | ICD-10-CM | POA: Diagnosis present

## 2022-11-23 DIAGNOSIS — R7989 Other specified abnormal findings of blood chemistry: Secondary | ICD-10-CM | POA: Diagnosis not present

## 2022-11-23 DIAGNOSIS — Z87891 Personal history of nicotine dependence: Secondary | ICD-10-CM | POA: Diagnosis not present

## 2022-11-23 DIAGNOSIS — R0789 Other chest pain: Secondary | ICD-10-CM | POA: Diagnosis not present

## 2022-11-23 DIAGNOSIS — Z881 Allergy status to other antibiotic agents status: Secondary | ICD-10-CM | POA: Diagnosis not present

## 2022-11-23 DIAGNOSIS — Z7901 Long term (current) use of anticoagulants: Secondary | ICD-10-CM | POA: Diagnosis not present

## 2022-11-23 DIAGNOSIS — R079 Chest pain, unspecified: Secondary | ICD-10-CM | POA: Diagnosis not present

## 2022-11-23 DIAGNOSIS — E860 Dehydration: Secondary | ICD-10-CM | POA: Diagnosis present

## 2022-11-23 DIAGNOSIS — I480 Paroxysmal atrial fibrillation: Secondary | ICD-10-CM | POA: Diagnosis present

## 2022-11-23 DIAGNOSIS — I77810 Thoracic aortic ectasia: Secondary | ICD-10-CM | POA: Diagnosis not present

## 2022-11-23 DIAGNOSIS — R911 Solitary pulmonary nodule: Secondary | ICD-10-CM | POA: Diagnosis not present

## 2022-11-23 DIAGNOSIS — R739 Hyperglycemia, unspecified: Secondary | ICD-10-CM | POA: Diagnosis not present

## 2022-11-23 DIAGNOSIS — Z7952 Long term (current) use of systemic steroids: Secondary | ICD-10-CM | POA: Diagnosis not present

## 2022-11-23 DIAGNOSIS — I080 Rheumatic disorders of both mitral and aortic valves: Secondary | ICD-10-CM | POA: Diagnosis not present

## 2022-11-23 DIAGNOSIS — Z888 Allergy status to other drugs, medicaments and biological substances status: Secondary | ICD-10-CM | POA: Diagnosis not present

## 2022-11-23 DIAGNOSIS — Z79899 Other long term (current) drug therapy: Secondary | ICD-10-CM | POA: Diagnosis not present

## 2022-11-27 ENCOUNTER — Ambulatory Visit (INDEPENDENT_AMBULATORY_CARE_PROVIDER_SITE_OTHER): Payer: Medicare Other | Admitting: Family Medicine

## 2022-11-27 ENCOUNTER — Telehealth: Payer: Self-pay | Admitting: *Deleted

## 2022-11-27 ENCOUNTER — Encounter: Payer: Self-pay | Admitting: Family Medicine

## 2022-11-27 VITALS — BP 143/66 | HR 62 | Ht 65.0 in | Wt 137.8 lb

## 2022-11-27 DIAGNOSIS — R600 Localized edema: Secondary | ICD-10-CM | POA: Insufficient documentation

## 2022-11-27 DIAGNOSIS — C44311 Basal cell carcinoma of skin of nose: Secondary | ICD-10-CM

## 2022-11-27 DIAGNOSIS — Z23 Encounter for immunization: Secondary | ICD-10-CM

## 2022-11-27 MED ORDER — FUROSEMIDE 20 MG PO TABS: 20.0000 mg | ORAL_TABLET | Freq: Every day | ORAL | 0 refills | Status: DC

## 2022-11-27 NOTE — Assessment & Plan Note (Signed)
Likely multifactorial.  She did get quite a a lot of fluid during her recent admission for DKA.  Weight is up some as well.  Will add 5 days of furosemide.  She will let me know if this is not improving with this.

## 2022-11-27 NOTE — Patient Instructions (Signed)
Take furosemide 20mg  daily x5 days.  Let me know if swelling is not improving.  Referral placed to dermatology.

## 2022-11-27 NOTE — Progress Notes (Signed)
Carmen Cooper - 82 y.o. female MRN [id number]  Date of birth: November 10, 1940  Subjective Chief Complaint  Patient presents with   Nose Problem    HPI Carmen Cooper is a 82 y.o. female her today with complaint of sore on her nose.  History of BCC of the nose that was removed previously.  This is in nearly the same area.  She doesn't have a whole lot of pain with this.  She did note some tiny bumps of the roof of her mouth which has resolved.    She also has b/l ankle swelling.  This is chronic in nature, but worse recently.  She does have PAF and hypothyroidism.  She is on CCB for her A. Fib.  She also received quite a bit of fluid during recent hospitalization for DKA.  Weight is up about 8lbs since last visit.   ROS:  A comprehensive ROS was completed and negative except as noted per HPI  Allergies  Allergen Reactions   Dexamethasone Anaphylaxis and Other (See Comments)    Blood sugar elevated     Brimonidine Tartrate Other (See Comments)    Burning and redness    Clindamycin/Lincomycin Rash   Sulfa Antibiotics Rash   Valacyclovir Hcl Rash    Currently taking no allergy     Past Medical History:  Diagnosis Date   Atrial fibrillation (HCC)    BCC (basal cell carcinoma of skin)    Diabetes (HCC)    Glaucoma    History of TIA (transient ischemic attack) 08/11/2013   12/2012 - Dr. Fara Olden    Hypertension    Hypothyroidism 08/11/2013   Memory changes    Microscopic colitis 08/21/2013   2008 - [facility] Dr. Myra Gianotti.  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 Loraine Grip, [facility]     Past Surgical History:  Procedure Laterality Date   BREAST EXCISIONAL BIOPSY Left    BREAST EXCISIONAL BIOPSY Left    CARDIOVERSION N/A 02/12/2019   Procedure: CARDIOVERSION;  Surgeon: Chrystie Nose, MD;  Location: [facility] ENDOSCOPY;  Service: Cardiovascular;  Laterality: N/A;   CARDIOVERSION N/A 07/20/2022   Procedure:  CARDIOVERSION;  Surgeon: Jake Bathe, MD;  Location: [facility] INVASIVE CV LAB;  Service: Cardiovascular;  Laterality: N/A;   MOHS SURGERY  2019   Nose bcc    OTHER SURGICAL HISTORY  04/01/2019   biopsy on nose and lip     Social History   Socioeconomic History   Marital status: Widowed    Spouse name: [name]   Number of children: 1   Years of education: 12   Highest education level: 12th grade  Occupational History   Occupation: Retired    Comment: retired  Tobacco Use   Smoking status: Former    Current packs/day: 0.25    Average packs/day: 0.3 packs/day for 20.0 years (5.0 ttl pk-yrs)    Types: Cigarettes   Smokeless tobacco: Never  Vaping Use   Vaping status: Never Used  Substance and Sexual Activity   Alcohol use: Not Currently   Drug use: No   Sexual activity: Not Currently    Partners: Male  Other Topics Concern   Not on file  Social History Narrative   Lives alone. She has one son, who takes her to her doctor's appointments. She enjoys crochet and gardening.   Social Determinants of Health   Financial Resource Strain: Low Risk  (03/17/2022)  Overall Financial Resource Strain (CARDIA)    Difficulty of Paying Living Expenses: Not hard at all  Food Insecurity: No Food Insecurity (11/27/2022)   Hunger Vital Sign    Worried About Running Out of Food in the Last Year: Never true    Ran Out of Food in the Last Year: Never true  Transportation Needs: No Transportation Needs (11/27/2022)   PRAPARE - Administrator, Civil Service (Medical): No    Lack of Transportation (Non-Medical): No  Physical Activity: Sufficiently Active (03/17/2022)   Exercise Vital Sign    Days of Exercise per Week: 4 days    Minutes of Exercise per Session: 40 min  Stress: No Stress Concern Present (06/08/2022)   Received from [facility] of Occupational Health - Occupational Stress Questionnaire    Feeling of Stress : Not at all  Social Connections:  Socially Isolated (03/17/2022)   Social Connection and Isolation Panel [NHANES]    Frequency of Communication with Friends and Family: More than three times a week    Frequency of Social Gatherings with Friends and Family: More than three times a week    Attends Religious Services: Never    Database administrator or Organizations: No    Attends Banker Meetings: Never    Marital Status: Widowed    Family History  Problem Relation Age of Onset   Heart disease Son    Hypertension Mother    Cancer Mother        unsure of origin   Heart attack Father    Diabetes Neg Hx     Health Maintenance  Topic Date Due   Diabetic kidney evaluation - Urine ACR  02/11/2015   Zoster Vaccines- Shingrix (2 of 2) 05/21/2021   OPHTHALMOLOGY EXAM  07/21/2021   FOOT EXAM  03/09/2022   HEMOGLOBIN A1C  08/25/2022   COVID-19 Vaccine (8 - 2023-24 season) 01/22/2023   Medicare Annual Wellness (AWV)  03/18/2023   Diabetic kidney evaluation - eGFR measurement  06/22/2023   DTaP/Tdap/Td (3 - Td or Tdap) 06/26/2031   Pneumonia Vaccine 76+ Years old  Completed   INFLUENZA VACCINE  Completed   DEXA SCAN  Completed   HPV VACCINES  Aged Out     ----------------------------------------------------------------------------------------------------------------------------------------------------------------------------------------------------------------- Physical Exam BP (!) 143/66 (BP Location: Left Arm, Patient Position: Sitting, Cuff Size: Normal)   Pulse 62   Ht 5\' 5"  (1.651 m)   Wt 137 lb 12 oz (62.5 kg)   SpO2 99%   BMI 22.92 kg/m   Physical Exam Constitutional:      Appearance: Normal appearance.  HENT:     Nose:     Comments: Ulceration along L anterior nare.   Cardiovascular:     Rate and Rhythm: Normal rate and regular rhythm.  Pulmonary:     Effort: Pulmonary effort is normal.     Breath sounds: Normal breath sounds.  Musculoskeletal:     Comments: 1+ edema bilaterally.    Neurological:     Mental Status: She is alert.     ------------------------------------------------------------------------------------------------------------------------------------------------------------------------------------------------------------------- Assessment and Plan  Lower extremity edema Likely multifactorial.  She did get quite a a lot of fluid during her recent admission for DKA.  Weight is up some as well.  Will add 5 days of furosemide.  She will let me know if this is not improving with this.  Basal cell carcinoma (BCC) of skin of nose Referral entered back to dermatology.  Meds ordered this encounter  Medications   furosemide (LASIX) 20 MG tablet    Sig: Take 1 tablet (20 mg total) by mouth daily for 5 days.    Dispense:  5 tablet    Refill:  0    No follow-ups on file.    This visit occurred during the SARS-CoV-2 public health emergency.  Safety protocols were in place, including screening questions prior to the visit, additional usage of staff PPE, and extensive cleaning of exam room while observing appropriate contact time as indicated for disinfecting solutions.

## 2022-11-27 NOTE — Assessment & Plan Note (Signed)
Referral entered back to dermatology.

## 2022-11-27 NOTE — Transitions of Care (Post Inpatient/ED Visit) (Signed)
11/27/2022  Name: Carmen Cooper MRN: [id number] DOB: Oct 04, 1940  Today's TOC FU Call Status: Today's TOC FU Call Status:: Successful TOC FU Call Completed TOC FU Call Complete Date: 11/27/22 Patient's Name and Date of Birth confirmed.  Transition Care Management Follow-up Telephone Call Date of Discharge: 11/24/22 Discharge Facility: Other ([facility]) Name of Other ([facility]) [facility]charge Facility: [facility] Type of Discharge: Inpatient Admission Primary Inpatient Discharge Diagnosis:: hyperglycemia- DKA How have you been since you were released from the hospital?: Better ("Oh I am fine now; just got home from the doctor's office; he said I was doing fine.  I really don't want anyone calling me, I have had diabetes for my entire life, I know what to do to take care of myself.") Any questions or concerns?: No  Items Reviewed: Did you receive and understand the discharge instructions provided?: Yes (briefly reviewed with patient who verbalizes good understanding of same - outside hospital AVS) Medications obtained,verified, and reconciled?: Partial Review Completed (Partial medication reconciliation/ review completed; confirmed patient obtained/ is taking newly Rx'd medications from PCP HFU OV earlier today; self-manages medications with sons assistance and denies questions/ concerns around medications today) Reason for Partial Mediation Review: Patient declined full medication review- not near medications; confirms no questions or concerns around meds; confirms obtained/ is taking post- HFU OV medications after today's office visit Any new allergies since your discharge?: No Dietary orders reviewed?: Yes Type of Diet Ordered:: "Good and healthy" Do you have support at home?: Yes People in Home: alone Name of Support/Comfort Primary Source: Reports resides alone independent in self-care activities; supportive local son and daughter in law assists as/ if needed/  indicated  Medications Reviewed Today: Medications Reviewed Today     Reviewed by Michaela Corner, RN (Registered Nurse) on 11/27/22 at 1624  Med List Status: <None>   Medication Order Taking? Sig Documenting Provider Last Dose Status Informant  acetaminophen (TYLENOL) 650 MG CR tablet [id number]  Take 1 tablet (650 mg total) by mouth every 8 (eight) hours as needed for pain. Monica Becton, MD  Active Family Member  AMBULATORY Clent Demark MEDICATION [id number]  Freestyle light test strips Test twice a day  Dx type 2 diabetes E11.9 Laren Boom, DO  Active Family Member  apixaban (ELIQUIS) 5 MG TABS tablet [id number]  Take 1 tablet (5 mg total) by mouth 2 (two) times daily. Eustace Pen, PA-C  Active Family Member           Med Note Brown Human, Selena Lesser Jul 20, 2022  6:43 AM) No missed doses >3 weeks  atorvastatin (LIPITOR) 40 MG tablet [id number]  Take 1 tablet (40 mg total) by mouth daily. Everrett Coombe, DO  Active   BAQSIMI TWO PACK 3 MG/DOSE POWD [id number]  Place 1 puff into both nostrils as needed (hypoglycemia). [provider]  Active Family Member  COSOPT PF 2-0.5 % SOLN ophthalmic solution [id number]  Place 1 drop into both eyes in the morning and at bedtime. [provider]  Active Family Member  diltiazem (CARDIZEM CD) 240 MG 24 hr capsule [id number]  Take 1 capsule (240 mg total) by mouth daily. Regan Lemming, MD  Active   diphenoxylate-atropine (LOMOTIL) 2.5-0.025 MG tablet [id number]  Take 1 tablet by mouth 4 (four) times daily as needed for diarrhea or loose stools. Everrett Coombe, DO  Active   folic acid (FOLVITE) 1 MG tablet [id number]  Take 1 mg by mouth daily.  [provider]  Active  Family Member           Med Note Ileene Musa Jul 17, 2022  4:38 PM) Last filled September 2023, pt has bottle and still takes daily. No explanation as to how she continues to take daily as it should have run out in Dec 2023  FREESTYLE LITE  test strip [id number]   [provider]  Active Family Member  furosemide (LASIX) 20 MG tablet [id number] Yes Take 1 tablet (20 mg total) by mouth daily for 5 days. Everrett Coombe, DO Taking Active            Med Note Marilu Favre Nov 27, 2022  4:24 PM) 11/27/22: Reports during [facility] call, she has obtained and plans to start taking "tomorrow"  Insulin Disposable Pump (OMNIPOD DASH 5 PACK PODS) MISC [id number]  Inject into the skin as directed. [provider]  Active Family Member  insulin glargine (LANTUS SOLOSTAR) 100 UNIT/ML Solostar Pen [id number]  Use in case of pump failure: Remove pump and take 15 units of Lantus insulin as basal insulin in case of insulin pump malfunction/failure. .Do not place pump back on until 20 hours AFTER last dose of Lantus to prevent "double basal infusion." [provider]  Active Family Member           Med Note Altamese Cabal Jul 17, 2022  3:37 PM) Med Expired   Insulin Human (INSULIN PUMP) SOLN [id number]  Inject into the skin as directed. insulin lispro (HUMALOG) 100 UNIT/ML [provider]  Active Family Member  insulin lispro (HUMALOG) 100 UNIT/ML injection [id number]  Medtronic 630G pump.  Basal 12-6a 0.625, 6a-7p 0.725, 7p-12a 0.625.  Preset bolus:  4/5/6.  ISF 50.  Total daily dose:  40 units/day [provider]  Active Family Member  Lifitegrast Benay Spice) 5 % SOLN [id number]  Place 1 drop into both eyes daily.  [provider]  Active Family Member  lisinopril (ZESTRIL) 20 MG tablet [id number]  TAKE ONE-HALF (1/2) TABLET DAILY Camnitz, Will Daphine Deutscher, MD  Active   Multiple Vitamins-Minerals (CENTRUM SILVER 50+WOMEN PO) [id number]  Take 1 tablet by mouth daily.  [provider]  Active Family Member  mycophenolate (CELLCEPT) 500 MG tablet [id number]  Take 500 mg by mouth 2 (two) times daily. [provider]  Active Family Member  neomycin-polymyxin b-dexamethasone (MAXITROL)  3.5-10000-0.1 OINT [id number]  Place into the left eye at bedtime. [provider]  Active Family Member  Omega-3 1000 MG CAPS [id number]  Take 1,000 mg by mouth daily. [provider]  Active Family Member  SURE COMFORT PEN NEEDLES 31G X 8 MM MISC [id number]   [provider]  Active Family Member  timolol (BETIMOL) 0.5 % ophthalmic solution [id number]  Place 1 drop into both eyes 2 (two) times daily. [provider]  Active Family Member  TIROSINT 88 MCG CAPS [id number]  Take 88 mcg by mouth 2 (two) times a week. 4 gel capsules on Wednesday and Saturday on empty stomach [provider]  Active Family Member  valACYclovir (VALTREX) 1000 MG tablet [id number]  Take 1,000 mg by mouth daily. [provider]  Active Family Member  ZIOPTAN 0.0015 % SOLN [id number]  Place 1 drop into both eyes at bedtime.  [provider]  Active Family Member           Home Care and Equipment/Supplies: Were Home Health Services Ordered?: No Any new equipment or medical supplies  ordered?: No  Functional Questionnaire: Do you need assistance with bathing/showering or dressing?: No Do you need assistance with meal preparation?: No Do you need assistance with eating?: No Do you have difficulty maintaining continence: No Do you need assistance with getting out of bed/getting out of a chair/moving?: No Do you have difficulty managing or taking your medications?: No  Follow up appointments reviewed: PCP Follow-up appointment confirmed?: Yes Date of PCP follow-up appointment?: 11/27/22 Follow-up Provider: PCP Specialist Hospital Follow-up appointment confirmed?: NA (verified not indicated per hospital discharging provider discharge notes) Do you need transportation to your follow-up appointment?: No Do you understand care options if your condition(s) worsen?: Yes-patient verbalized understanding  SDOH Interventions Today    Flowsheet Row Most Recent Value   SDOH Interventions   Food Insecurity Interventions Intervention Not Indicated  Transportation Interventions Intervention Not Indicated  [son and daughter-in-law provide transportation]       TOC Interventions Today    Flowsheet Row Most Recent Value  TOC Interventions   TOC Interventions Discussed/Reviewed TOC Interventions Discussed  [Patient declines need for ongoing/ further care management/ coordination outreach,  declines enrollment in 30-day TOC program- declines taking my direct phone number should needs/ concerns arise post-TOC call]      Interventions Today    Flowsheet Row Most Recent Value  Chronic Disease   Chronic disease during today's visit Diabetes  General Interventions   General Interventions Discussed/Reviewed General Interventions Discussed, Durable Medical Equipment (DME), Doctor Visits  Doctor Visits Discussed/Reviewed Doctor Visits Discussed, PCP  Durable Medical Equipment (DME) Other  [confirmed not currently requiring/ using assistive devices for ambulation]  PCP/Specialist Visits Compliance with follow-up visit  Exercise Interventions   Exercise Discussed/Reviewed Exercise Discussed  Nutrition Interventions   Nutrition Discussed/Reviewed Nutrition Discussed  Pharmacy Interventions   Pharmacy Dicussed/Reviewed Pharmacy Topics Discussed  Safety Interventions   Safety Discussed/Reviewed Safety Discussed      Caryl Pina, RN, BSN, CCRN Alumnus RN Care Manager  Transitions of Care  [facility] Population [facility]   [phone]: direct office

## 2022-11-28 DIAGNOSIS — I4891 Unspecified atrial fibrillation: Secondary | ICD-10-CM | POA: Diagnosis not present

## 2022-11-28 DIAGNOSIS — K2289 Other specified disease of esophagus: Secondary | ICD-10-CM | POA: Diagnosis not present

## 2022-11-28 DIAGNOSIS — I110 Hypertensive heart disease with heart failure: Secondary | ICD-10-CM | POA: Diagnosis not present

## 2022-11-28 DIAGNOSIS — I509 Heart failure, unspecified: Secondary | ICD-10-CM | POA: Diagnosis not present

## 2022-11-28 DIAGNOSIS — K219 Gastro-esophageal reflux disease without esophagitis: Secondary | ICD-10-CM | POA: Diagnosis not present

## 2022-11-28 DIAGNOSIS — B3781 Candidal esophagitis: Secondary | ICD-10-CM | POA: Diagnosis not present

## 2022-11-28 DIAGNOSIS — K22710 Barrett's esophagus with low grade dysplasia: Secondary | ICD-10-CM | POA: Diagnosis not present

## 2022-12-04 ENCOUNTER — Encounter: Payer: Self-pay | Admitting: Family Medicine

## 2022-12-06 ENCOUNTER — Telehealth: Payer: Self-pay | Admitting: *Deleted

## 2022-12-06 ENCOUNTER — Encounter: Payer: Self-pay | Admitting: Cardiology

## 2022-12-06 DIAGNOSIS — H209 Unspecified iridocyclitis: Secondary | ICD-10-CM | POA: Diagnosis not present

## 2022-12-06 DIAGNOSIS — Z9889 Other specified postprocedural states: Secondary | ICD-10-CM | POA: Diagnosis not present

## 2022-12-06 DIAGNOSIS — I4891 Unspecified atrial fibrillation: Secondary | ICD-10-CM

## 2022-12-06 DIAGNOSIS — H353212 Exudative age-related macular degeneration, right eye, with inactive choroidal neovascularization: Secondary | ICD-10-CM | POA: Diagnosis not present

## 2022-12-06 DIAGNOSIS — H4043X3 Glaucoma secondary to eye inflammation, bilateral, severe stage: Secondary | ICD-10-CM | POA: Diagnosis not present

## 2022-12-06 DIAGNOSIS — R0789 Other chest pain: Secondary | ICD-10-CM

## 2022-12-06 DIAGNOSIS — Z961 Presence of intraocular lens: Secondary | ICD-10-CM | POA: Diagnosis not present

## 2022-12-06 LAB — HM DIABETES EYE EXAM

## 2022-12-06 MED ORDER — APIXABAN 5 MG PO TABS: 5.0000 mg | ORAL_TABLET | Freq: Two times a day (BID) | ORAL | 1 refills | Status: DC

## 2022-12-06 NOTE — Telephone Encounter (Signed)
Refill approved for Eliquis 5mg  tablets  and RX sent in.

## 2022-12-06 NOTE — Telephone Encounter (Signed)
Prescription refill request for Eliquis received. Indication:  AF Last office visit: 09/18/22  Carleene Mains MD Scr: 1.22 on 11/24/22  Epic Age: 82 Weight: 58.9kg  Pt is taking Eliquis 5mg  twice daily.  Based on above findings Eliquis 2.5mg  twice daily is the appropriate dose.  Message sent to PharmD Pool to advise on dose reduction.

## 2022-12-06 NOTE — Telephone Encounter (Signed)
Most recent weight on 11/27/22 was 62.5kg, please continue 5mg  BID dosing, thanks!

## 2022-12-07 DIAGNOSIS — E039 Hypothyroidism, unspecified: Secondary | ICD-10-CM | POA: Diagnosis not present

## 2022-12-15 DIAGNOSIS — K22710 Barrett's esophagus with low grade dysplasia: Secondary | ICD-10-CM | POA: Diagnosis not present

## 2022-12-15 DIAGNOSIS — R1319 Other dysphagia: Secondary | ICD-10-CM | POA: Diagnosis not present

## 2022-12-21 DIAGNOSIS — C44321 Squamous cell carcinoma of skin of nose: Secondary | ICD-10-CM | POA: Diagnosis not present

## 2022-12-21 DIAGNOSIS — D492 Neoplasm of unspecified behavior of bone, soft tissue, and skin: Secondary | ICD-10-CM | POA: Diagnosis not present

## 2022-12-21 DIAGNOSIS — L821 Other seborrheic keratosis: Secondary | ICD-10-CM | POA: Diagnosis not present

## 2022-12-21 DIAGNOSIS — L570 Actinic keratosis: Secondary | ICD-10-CM | POA: Diagnosis not present

## 2022-12-21 DIAGNOSIS — D225 Melanocytic nevi of trunk: Secondary | ICD-10-CM | POA: Diagnosis not present

## 2023-01-04 DIAGNOSIS — C44321 Squamous cell carcinoma of skin of nose: Secondary | ICD-10-CM | POA: Diagnosis not present

## 2023-02-01 DIAGNOSIS — H401120 Primary open-angle glaucoma, left eye, stage unspecified: Secondary | ICD-10-CM | POA: Diagnosis not present

## 2023-02-01 DIAGNOSIS — Z961 Presence of intraocular lens: Secondary | ICD-10-CM | POA: Diagnosis not present

## 2023-02-01 DIAGNOSIS — H353212 Exudative age-related macular degeneration, right eye, with inactive choroidal neovascularization: Secondary | ICD-10-CM | POA: Diagnosis not present

## 2023-02-01 DIAGNOSIS — H30031 Focal chorioretinal inflammation, peripheral, right eye: Secondary | ICD-10-CM | POA: Diagnosis not present

## 2023-02-01 DIAGNOSIS — H3581 Retinal edema: Secondary | ICD-10-CM | POA: Diagnosis not present

## 2023-02-01 DIAGNOSIS — H353221 Exudative age-related macular degeneration, left eye, with active choroidal neovascularization: Secondary | ICD-10-CM | POA: Diagnosis not present

## 2023-02-01 DIAGNOSIS — H209 Unspecified iridocyclitis: Secondary | ICD-10-CM | POA: Diagnosis not present

## 2023-02-01 DIAGNOSIS — H44111 Panuveitis, right eye: Secondary | ICD-10-CM | POA: Diagnosis not present

## 2023-02-01 DIAGNOSIS — Z79899 Other long term (current) drug therapy: Secondary | ICD-10-CM | POA: Diagnosis not present

## 2023-02-01 DIAGNOSIS — H4041X1 Glaucoma secondary to eye inflammation, right eye, mild stage: Secondary | ICD-10-CM | POA: Diagnosis not present

## 2023-02-05 DIAGNOSIS — E039 Hypothyroidism, unspecified: Secondary | ICD-10-CM | POA: Diagnosis not present

## 2023-02-08 DIAGNOSIS — Z48817 Encounter for surgical aftercare following surgery on the skin and subcutaneous tissue: Secondary | ICD-10-CM | POA: Diagnosis not present

## 2023-02-09 ENCOUNTER — Telehealth (INDEPENDENT_AMBULATORY_CARE_PROVIDER_SITE_OTHER): Payer: Medicare Other | Admitting: Family Medicine

## 2023-02-09 ENCOUNTER — Encounter: Payer: Self-pay | Admitting: Family Medicine

## 2023-02-09 VITALS — BP 142/76 | Ht 65.0 in | Wt 134.0 lb

## 2023-02-09 DIAGNOSIS — R600 Localized edema: Secondary | ICD-10-CM

## 2023-02-09 DIAGNOSIS — I4819 Other persistent atrial fibrillation: Secondary | ICD-10-CM | POA: Insufficient documentation

## 2023-02-09 MED ORDER — FUROSEMIDE 20 MG PO TABS: ORAL_TABLET | ORAL | 1 refills | Status: DC

## 2023-02-09 NOTE — Assessment & Plan Note (Signed)
Likely multifactorial.  Has venous insufficiency and she is in A. Fib today.  Swelling is better.  Adding furosemide back on to use as needed for excess fluid.

## 2023-02-09 NOTE — Progress Notes (Signed)
Carmen Cooper - 82 y.o. female MRN [id number]  Date of birth: [birth year]  Subjective Chief Complaint  Patient presents with   Leg Swelling    HPI Carmen Cooper is a 82 y.o. female here today with complaint of bilateral leg swelling.  She noticed this a few days ago.  She has had some pain with swelling.  She is on Eliquis due to A. Fib history.  She has been prescribed lasix in the past for excess fluid and edema but is currently out of this.  She describes skin as feeling "tight" on her legs and feet.  She did start wearing compression stockings again and her swelling has improved some today.   ROS:  A comprehensive ROS was completed and negative except as noted per HPI  Allergies  Allergen Reactions   Dexamethasone Anaphylaxis and Other (See Comments)    Blood sugar elevated     Brimonidine Tartrate Other (See Comments)    Burning and redness    Clindamycin/Lincomycin Rash   Sulfa Antibiotics Rash   Valacyclovir Hcl Rash    Currently taking no allergy     Past Medical History:  Diagnosis Date   Atrial fibrillation (HCC)    BCC (basal cell carcinoma of skin)    Diabetes (HCC)    Glaucoma    History of TIA (transient ischemic attack) 08/11/2013   12/2012 - Dr. Fara Olden    Hypertension    Hypothyroidism 08/11/2013   Memory changes    Microscopic colitis 08/21/2013   2008 - [facility] Dr. Myra Gianotti.  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 Loraine Grip, [facility]     Past Surgical History:  Procedure Laterality Date   BREAST EXCISIONAL BIOPSY Left    BREAST EXCISIONAL BIOPSY Left    CARDIOVERSION N/A 02/12/2019   Procedure: CARDIOVERSION;  Surgeon: Chrystie Nose, MD;  Location: [facility] ENDOSCOPY;  Service: Cardiovascular;  Laterality: N/A;   CARDIOVERSION N/A 07/20/2022   Procedure: CARDIOVERSION;  Surgeon: Jake Bathe, MD;  Location: [facility] INVASIVE CV LAB;  Service: Cardiovascular;  Laterality: N/A;    MOHS SURGERY  2019   Nose bcc    OTHER SURGICAL HISTORY  04/01/2019   biopsy on nose and lip     Social History   Socioeconomic History   Marital status: Widowed    Spouse name: [name]   Number of children: 1   Years of education: 12   Highest education level: 12th grade  Occupational History   Occupation: Retired    Comment: retired  Tobacco Use   Smoking status: Former    Current packs/day: 0.25    Average packs/day: 0.3 packs/day for 20.0 years (5.0 ttl pk-yrs)    Types: Cigarettes   Smokeless tobacco: Never  Vaping Use   Vaping status: Never Used  Substance and Sexual Activity   Alcohol use: Not Currently   Drug use: No   Sexual activity: Not Currently    Partners: Male  Other Topics Concern   Not on file  Social History Narrative   Lives alone. She has one son, who takes her to her doctor's appointments. She enjoys crochet and gardening.   Social Drivers of Corporate investment banker Strain: Low Risk  (03/17/2022)   Overall Financial Resource Strain (CARDIA)    Difficulty of Paying Living Expenses: Not hard at all  Food Insecurity: No Food Insecurity (11/27/2022)   Hunger Vital Sign  Worried About Programme researcher, broadcasting/film/video in the Last Year: Never true    Ran Out of Food in the Last Year: Never true  Transportation Needs: No Transportation Needs (11/27/2022)   PRAPARE - Administrator, Civil Service (Medical): No    Lack of Transportation (Non-Medical): No  Physical Activity: Sufficiently Active (03/17/2022)   Exercise Vital Sign    Days of Exercise per Week: 4 days    Minutes of Exercise per Session: 40 min  Stress: No Stress Concern Present (06/08/2022)   Received from [facility] - Occupational Stress Questionnaire    Feeling of Stress : Not at all  Social Connections: Socially Isolated (03/17/2022)   Social Connection and Isolation Panel [NHANES]    Frequency of Communication with Friends and  Family: More than three times a week    Frequency of Social Gatherings with Friends and Family: More than three times a week    Attends Religious Services: Never    Database administrator or Organizations: No    Attends Banker Meetings: Never    Marital Status: Widowed    Family History  Problem Relation Age of Onset   Heart disease Son    Hypertension Mother    Cancer Mother        unsure of origin   Heart attack Father    Diabetes Neg Hx     Health Maintenance  Topic Date Due   Diabetic kidney evaluation - Urine ACR  02/11/2015   Zoster Vaccines- Shingrix (2 of 2) 05/21/2021   OPHTHALMOLOGY EXAM  07/21/2021   FOOT EXAM  03/09/2022   HEMOGLOBIN A1C  08/25/2022   COVID-19 Vaccine (8 - 2024-25 season) 01/22/2023   Medicare Annual Wellness (AWV)  03/18/2023   Diabetic kidney evaluation - eGFR measurement  06/22/2023   DTaP/Tdap/Td (3 - Td or Tdap) 06/26/2031   Pneumonia Vaccine 9+ Years old  Completed   INFLUENZA VACCINE  Completed   DEXA SCAN  Completed   HPV VACCINES  Aged Out     ----------------------------------------------------------------------------------------------------------------------------------------------------------------------------------------------------------------- Physical Exam BP (!) 142/76 (BP Location: Left Arm, Patient Position: Sitting, Cuff Size: Small)   Ht 5\' 5"  (1.651 m)   Wt 134 lb (60.8 kg)   BMI 22.30 kg/m   Physical Exam Constitutional:      Appearance: Normal appearance.  HENT:     Head: Normocephalic and atraumatic.  Cardiovascular:     Rate and Rhythm: Normal rate and regular rhythm.  Pulmonary:     Effort: Pulmonary effort is normal.     Breath sounds: Normal breath sounds.  Musculoskeletal:     Cervical back: Neck supple.  Neurological:     General: No focal deficit present.     Mental Status: She is alert.  Psychiatric:        Mood and Affect: Mood normal.        Behavior: Behavior normal.      ------------------------------------------------------------------------------------------------------------------------------------------------------------------------------------------------------------------- Assessment and Plan  Edema Likely multifactorial.  Has venous insufficiency and she is in A. Fib today.  Swelling is better.  Adding furosemide back on to use as needed for excess fluid.    Persistent atrial fibrillation (HCC) Managed by cardiology/EP.  She is in A. Fib today.  Relatively asymptomatic other than some swelling which is improved today.  Rate is controlled.  Discussed that if she is developing increased symptoms she may need cardioversion again.  Red flags discussed.  Meds ordered this encounter  Medications   furosemide (LASIX) 20 MG tablet    Sig: Take 1 tab daily as needed for swelling in legs.    Dispense:  30 tablet    Refill:  1    No follow-ups on file.    This visit occurred during the SARS-CoV-2 public health emergency.  Safety protocols were in place, including screening questions prior to the visit, additional usage of staff PPE, and extensive cleaning of exam room while observing appropriate contact time as indicated for disinfecting solutions.

## 2023-02-09 NOTE — Assessment & Plan Note (Signed)
Managed by cardiology/EP.  She is in A. Fib today.  Relatively asymptomatic other than some swelling which is improved today.  Rate is controlled.  Discussed that if she is developing increased symptoms she may need cardioversion again.  Red flags discussed.

## 2023-02-09 NOTE — Patient Instructions (Addendum)
Take fuosemide 1 tab daily as needed for excess swelling, weight gain of more than 5 lbs in a 24 hour period.  If swelling worsens or you develops shortness of breath, new cough or increasing fatigue let me know as you may need another cardioversion with Dr. Elberta Fortis.

## 2023-02-12 DIAGNOSIS — Z79899 Other long term (current) drug therapy: Secondary | ICD-10-CM | POA: Diagnosis not present

## 2023-02-12 DIAGNOSIS — E039 Hypothyroidism, unspecified: Secondary | ICD-10-CM | POA: Diagnosis not present

## 2023-02-12 DIAGNOSIS — E1021 Type 1 diabetes mellitus with diabetic nephropathy: Secondary | ICD-10-CM | POA: Diagnosis not present

## 2023-02-12 DIAGNOSIS — D84821 Immunodeficiency due to drugs: Secondary | ICD-10-CM | POA: Diagnosis not present

## 2023-02-12 LAB — HEMOGLOBIN A1C: Hemoglobin A1C: 8.3

## 2023-02-19 ENCOUNTER — Ambulatory Visit: Payer: Medicare Other | Admitting: Family Medicine

## 2023-03-02 ENCOUNTER — Encounter: Payer: Self-pay | Admitting: Cardiology

## 2023-03-12 DIAGNOSIS — Z87891 Personal history of nicotine dependence: Secondary | ICD-10-CM | POA: Diagnosis not present

## 2023-03-12 DIAGNOSIS — Z7901 Long term (current) use of anticoagulants: Secondary | ICD-10-CM | POA: Diagnosis not present

## 2023-03-12 DIAGNOSIS — I130 Hypertensive heart and chronic kidney disease with heart failure and stage 1 through stage 4 chronic kidney disease, or unspecified chronic kidney disease: Secondary | ICD-10-CM | POA: Diagnosis not present

## 2023-03-12 DIAGNOSIS — I700 Atherosclerosis of aorta: Secondary | ICD-10-CM | POA: Diagnosis not present

## 2023-03-12 DIAGNOSIS — Z794 Long term (current) use of insulin: Secondary | ICD-10-CM | POA: Diagnosis not present

## 2023-03-12 DIAGNOSIS — K22710 Barrett's esophagus with low grade dysplasia: Secondary | ICD-10-CM | POA: Diagnosis not present

## 2023-03-12 DIAGNOSIS — K2289 Other specified disease of esophagus: Secondary | ICD-10-CM | POA: Diagnosis not present

## 2023-03-12 DIAGNOSIS — I4891 Unspecified atrial fibrillation: Secondary | ICD-10-CM | POA: Diagnosis not present

## 2023-03-12 DIAGNOSIS — I272 Pulmonary hypertension, unspecified: Secondary | ICD-10-CM | POA: Diagnosis not present

## 2023-03-12 DIAGNOSIS — Z79899 Other long term (current) drug therapy: Secondary | ICD-10-CM | POA: Diagnosis not present

## 2023-03-12 DIAGNOSIS — K22711 Barrett's esophagus with high grade dysplasia: Secondary | ICD-10-CM | POA: Diagnosis not present

## 2023-03-12 DIAGNOSIS — N1831 Chronic kidney disease, stage 3a: Secondary | ICD-10-CM | POA: Diagnosis not present

## 2023-03-12 DIAGNOSIS — Z8673 Personal history of transient ischemic attack (TIA), and cerebral infarction without residual deficits: Secondary | ICD-10-CM | POA: Diagnosis not present

## 2023-03-12 DIAGNOSIS — E1022 Type 1 diabetes mellitus with diabetic chronic kidney disease: Secondary | ICD-10-CM | POA: Diagnosis not present

## 2023-03-21 ENCOUNTER — Ambulatory Visit: Payer: Medicare Other

## 2023-03-21 VITALS — Ht 65.0 in | Wt 128.0 lb

## 2023-03-21 DIAGNOSIS — Z780 Asymptomatic menopausal state: Secondary | ICD-10-CM

## 2023-03-21 DIAGNOSIS — Z87891 Personal history of nicotine dependence: Secondary | ICD-10-CM

## 2023-03-21 DIAGNOSIS — Z0000 Encounter for general adult medical examination without abnormal findings: Secondary | ICD-10-CM | POA: Diagnosis not present

## 2023-03-21 DIAGNOSIS — M8580 Other specified disorders of bone density and structure, unspecified site: Secondary | ICD-10-CM

## 2023-03-21 NOTE — Patient Instructions (Signed)
  Carmen Cooper , Thank you for taking time to come for your Medicare Wellness Visit. I appreciate your ongoing commitment to your health goals. Please review the following plan we discussed and let me know if I can assist you in the future.   These are the goals we discussed:  Goals       Activity and Exercise Increased      She would like to increase her activity and exercise. She does walk her dog.       Patient Stated (pt-stated)      03/11/2021 AWV Goal: Improved Nutrition/Diet  Patient will verbalize understanding that diet plays an important role in overall health and that a poor diet is a risk factor for many chronic medical conditions.  Over the next year, patient will improve self management of their diet by incorporating more water. Patient will utilize available community resources to help with food acquisition if needed (ex: food pantries, [address], etc) Patient will work with nutrition specialist if a referral was made       Patient Stated (pt-stated)      Patient stated that she would like to be able to manage her diabetes and afib better.      stress management      Manage stress at home better with taking care of husband with dementia        This is a list of the screening recommended for you and due dates:  Health Maintenance  Topic Date Due   Yearly kidney health urinalysis for diabetes  02/11/2015   Zoster (Shingles) Vaccine (2 of 2) 05/21/2021   Complete foot exam   03/09/2022   COVID-19 Vaccine (8 - 2024-25 season) 01/22/2023   Yearly kidney function blood test for diabetes  06/22/2023   Hemoglobin A1C  08/13/2023   Eye exam for diabetics  12/06/2023   Medicare Annual Wellness Visit  03/20/2024   DTaP/Tdap/Td vaccine (3 - Td or Tdap) 06/26/2031   Pneumonia Vaccine  Completed   Flu Shot  Completed   DEXA scan (bone density measurement)  Completed   HPV Vaccine  Aged Out   You have an order for:  []   2D Mammogram  []   3D Mammogram  [x]   Bone Density

## 2023-03-21 NOTE — Progress Notes (Signed)
 Subjective:   Carmen Cooper is a 83 y.o. female who presents for Medicare Annual (Subsequent) preventive examination.  Visit Complete: Virtual I connected with  [name] on 03/21/23 by a audio enabled telemedicine application and verified that I am speaking with the correct person using two identifiers.  Patient Location: Home  Provider Location: Office/Clinic  I discussed the limitations of evaluation and management by telemedicine. The patient expressed understanding and agreed to proceed.  Vital Signs: Because this visit was a virtual/telehealth visit, some criteria may be missing or patient reported. Any vitals not documented were not able to be obtained and vitals that have been documented are patient reported.  Patient Medicare AWV questionnaire was completed by the patient on n/a; I have confirmed that all information answered by patient is correct and no changes since this date.  Cardiac Risk Factors include: advanced age (>65men, >61 women);diabetes mellitus;dyslipidemia;hypertension     Objective:    Today's Vitals   03/21/23 0948  Weight: 128 lb (58.1 kg)  Height: 5' 5 (1.651 m)   Body mass index is 21.3 kg/m.     03/21/2023   10:11 AM 03/17/2022    9:45 AM 03/11/2021    8:10 AM 02/12/2019    9:23 AM 04/11/2018   11:52 AM 02/12/2018    8:15 AM  Advanced Directives  Does Patient Have a Medical Advance Directive? Yes Yes Yes Yes Yes Yes  Type of Estate Agent of [place];Living will Living will Living will;Healthcare Power of [employer] Power of [place];Living will Healthcare Power of [place];Living will Healthcare Power of [place];Living will  Does patient want to make changes to medical advance directive? No - Patient declined No - Patient declined No - Patient declined   No - Patient declined  Copy of Healthcare Power of Attorney in Chart? No - copy requested  No - copy requested   No - copy requested    Current Medications  (verified) Outpatient Encounter Medications as of 03/21/2023  Medication Sig   acetaminophen  (TYLENOL ) 650 MG CR tablet Take 1 tablet (650 mg total) by mouth every 8 (eight) hours as needed for pain.   apixaban  (ELIQUIS ) 5 MG TABS tablet Take 1 tablet (5 mg total) by mouth 2 (two) times daily.   atorvastatin  (LIPITOR) 40 MG tablet Take 1 tablet (40 mg total) by mouth daily.   BAQSIMI TWO PACK 3 MG/DOSE POWD Place 1 puff into both nostrils as needed (hypoglycemia).   COSOPT  PF 2-0.5 % SOLN ophthalmic solution Place 1 drop into both eyes in the morning and at bedtime.   diltiazem  (CARDIZEM  CD) 240 MG 24 hr capsule Take 1 capsule (240 mg total) by mouth daily.   folic acid  (FOLVITE ) 1 MG tablet Take 1 mg by mouth daily.    furosemide  (LASIX ) 20 MG tablet Take 1 tab daily as needed for swelling in legs.   Insulin  Disposable Pump (OMNIPOD DASH 5 PACK PODS) MISC Inject into the skin as directed.   Insulin  Human (INSULIN  PUMP) SOLN Inject into the skin as directed. insulin  lispro (HUMALOG ) 100 UNIT/ML   insulin  lispro (HUMALOG ) 100 UNIT/ML injection Medtronic 630G pump.  Basal 12-6a 0.625, 6a-7p 0.725, 7p-12a 0.625.  Preset bolus:  4/5/6.  ISF 50.  Total daily dose:  40 units/day   Lifitegrast (XIIDRA) 5 % SOLN Place 1 drop into both eyes daily.    lisinopril  (ZESTRIL ) 20 MG tablet TAKE ONE-HALF (1/2) TABLET DAILY   Multiple Vitamins-Minerals (CENTRUM SILVER 50+WOMEN PO) Take 1 tablet  by mouth daily.    mycophenolate  (CELLCEPT ) 500 MG tablet Take 500 mg by mouth 2 (two) times daily.   neomycin-polymyxin b-dexamethasone  (MAXITROL) 3.5-10000-0.1 OINT Place into the left eye at bedtime.   Omega-3 1000 MG CAPS Take 1,000 mg by mouth daily.   SURE COMFORT PEN NEEDLES 31G X 8 MM MISC    timolol  (BETIMOL ) 0.5 % ophthalmic solution Place 1 drop into both eyes 2 (two) times daily.   TIROSINT  88 MCG CAPS Take 88 mcg by mouth 2 (two) times a week. 4 gel capsules on Wednesday and Saturday on empty stomach    valACYclovir (VALTREX) 1000 MG tablet Take 1,000 mg by mouth daily.   ZIOPTAN 0.0015 % SOLN Place 1 drop into both eyes at bedtime.    AMBULATORY NON FORMULARY MEDICATION Freestyle light test strips Test twice a day  Dx type 2 diabetes E11.9   FREESTYLE LITE test strip  (Patient not taking: Reported on 03/21/2023)   No facility-administered encounter medications on file as of 03/21/2023.    Allergies (verified) Dexamethasone , Brimonidine tartrate, Clindamycin /lincomycin, Sulfa antibiotics, and Valacyclovir hcl   History: Past Medical History:  Diagnosis Date   Atrial fibrillation (HCC)    BCC (basal cell carcinoma of skin)    Diabetes (HCC)    Glaucoma    History of TIA (transient ischemic attack) 08/11/2013   12/2012 - Dr. Maryanne    Hypertension    Hypothyroidism 08/11/2013   Memory changes    Microscopic colitis 08/21/2013   2008 - [facility] Dr. [name]  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 Nilsa, [facility]    Past Surgical History:  Procedure Laterality Date   BREAST EXCISIONAL BIOPSY Left    BREAST EXCISIONAL BIOPSY Left    CARDIOVERSION N/A 02/12/2019   Procedure: CARDIOVERSION;  Surgeon: Mona Vinie BROCKS, MD;  Location: [facility] ENDOSCOPY;  Service: Cardiovascular;  Laterality: N/A;   CARDIOVERSION N/A 07/20/2022   Procedure: CARDIOVERSION;  Surgeon: Jeffrie Oneil BROCKS, MD;  Location: [facility] INVASIVE CV LAB;  Service: Cardiovascular;  Laterality: N/A;   MOHS SURGERY  2019   Nose bcc    OTHER SURGICAL HISTORY  04/01/2019   biopsy on nose and lip    Family History  Problem Relation Age of Onset   Heart disease Son    Hypertension Mother    Cancer Mother        unsure of origin   Heart attack Father    Diabetes Neg Hx    Social History   Socioeconomic History   Marital status: Widowed    Spouse name: [name]   Number of children: 1   Years of education: 12   Highest education level: 12th grade   Occupational History   Occupation: Retired    Comment: retired  Tobacco Use   Smoking status: Former    Current packs/day: 0.00    Average packs/day: 0.3 packs/day for 64.1 years (16.0 ttl pk-yrs)    Types: Cigarettes    Start date: 43    Quit date: 03/20/2018    Years since quitting: 5.0   Smokeless tobacco: Never  Vaping Use   Vaping status: Never Used  Substance and Sexual Activity   Alcohol use: Not Currently   Drug use: No   Sexual activity: Not Currently    Partners: Male  Other Topics Concern   Not on file  Social History Narrative   Lives alone.  She has one son, who takes her to her doctor's appointments. She enjoys crochet and gardening.   Social Drivers of Corporate Investment Banker Strain: Low Risk  (03/21/2023)   Overall Financial Resource Strain (CARDIA)    Difficulty of Paying Living Expenses: Not hard at all  Food Insecurity: No Food Insecurity (03/21/2023)   Hunger Vital Sign    Worried About Running Out of Food in the Last Year: Never true    Ran Out of Food in the Last Year: Never true  Transportation Needs: No Transportation Needs (03/21/2023)   PRAPARE - Administrator, Civil Service (Medical): No    Lack of Transportation (Non-Medical): No  Physical Activity: Sufficiently Active (03/21/2023)   Exercise Vital Sign    Days of Exercise per Week: 4 days    Minutes of Exercise per Session: 40 min  Stress: No Stress Concern Present (03/21/2023)   [employer] of Occupational Health - Occupational Stress Questionnaire    Feeling of Stress : Not at all  Social Connections: Socially Isolated (03/21/2023)   Social Connection and Isolation Panel [NHANES]    Frequency of Communication with Friends and Family: More than three times a week    Frequency of Social Gatherings with Friends and Family: More than three times a week    Attends Religious Services: Never    Database Administrator or Organizations: No    Attends Banker Meetings:  Never    Marital Status: Widowed    Tobacco Counseling Counseling given: Not Answered   Clinical Intake:  Pre-visit preparation completed: Yes  Pain : No/denies pain     BMI - recorded: 21.3 Nutritional Status: BMI of 19-24  Normal Nutritional Risks: None Diabetes: Yes CBG done?: Yes CBG resulted in Enter/ Edit results?: No Did pt. bring in CBG monitor from home?: No  How often do you need to have someone help you when you read instructions, pamphlets, or other written materials from your doctor or pharmacy?: 1 - Never What is the last grade level you completed in school?: Went to school in [place]. The grade level unknown.  Interpreter Needed?: No      Activities of Daily Living    03/21/2023    9:55 AM 07/20/2022    6:52 AM  In your present state of health, do you have any difficulty performing the following activities:  Hearing? 0 0  Vision? 1 0  Comment Doesn't drive   Difficulty concentrating or making decisions? 0 0  Walking or climbing stairs? 0 0  Dressing or bathing? 0 0  Doing errands, shopping? 0   Preparing Food and eating ? N   Using the Toilet? N   In the past six months, have you accidently leaked urine? N   Do you have problems with loss of bowel control? N   Managing your Medications? N   Managing your Finances? N   Housekeeping or managing your Housekeeping? N     Patient Care Team: Alvia Bring, DO as PCP - General (Family Medicine) Inocencio Soyla Lunger, MD as PCP - Cardiology (Cardiology) Candi Lonni BIRCH, MD (Gastroenterology) Beryl Donnice BRAVO, MD as Referring Physician (Endocrinology) Maree Paticia BRAVO, MD as Referring Physician (Ophthalmology) Nilsa Dempsey Pac, MD as Referring Physician (Ophthalmology) Inocencio Soyla Lunger, MD as Consulting Physician (Cardiology)  Indicate any recent Medical Services you may have received from other than [facility] providers in the past year (date may be approximate).     Assessment:  This is a  routine wellness examination for Arbadella.  Hearing/Vision screen Hearing Screening - Comments:: Unable to test Vision Screening - Comments:: Unable to test   Goals Addressed             This Visit's Progress    Activity and Exercise Increased       She would like to increase her activity and exercise. She does walk her dog.       Depression Screen    03/21/2023   10:09 AM 03/17/2022    9:46 AM 02/15/2022    4:32 PM 03/11/2021    8:11 AM 09/06/2020    8:49 AM 01/02/2019    3:32 PM 04/03/2018    8:48 AM  PHQ 2/9 Scores  PHQ - 2 Score 0 1 3 2 2  0 5  PHQ- 9 Score    2   13    Fall Risk    03/17/2022    9:46 AM 02/15/2022    4:32 PM 08/15/2021    8:15 AM 03/11/2021    8:10 AM 03/09/2021    2:32 PM  Fall Risk   Falls in the past year? 1 0 0 0 0  Number falls in past yr: 0 0 1 0 0  Injury with Fall? 0 0 0 0 0  Risk for fall due to : History of fall(s) No Fall Risks Impaired vision No Fall Risks No Fall Risks  Follow up Falls evaluation completed;Education provided Falls evaluation completed Falls evaluation completed Falls evaluation completed;Education provided Falls evaluation completed    MEDICARE RISK AT HOME: Medicare Risk at Home Any stairs in or around the home?: Yes If so, are there any without handrails?: Yes Home free of loose throw rugs in walkways, pet beds, electrical cords, etc?: Yes Adequate lighting in your home to reduce risk of falls?: Yes Life alert?: No Use of a cane, walker or w/c?: No Grab bars in the bathroom?: No Shower chair or bench in shower?: Yes Elevated toilet seat or a handicapped toilet?: No  TIMED UP AND GO:  Was the test performed?  No    Cognitive Function:    04/01/2019    1:18 PM  MMSE - Mini Mental State Exam  Orientation to time 5  Orientation to Place 5  Registration 3  Attention/ Calculation 5  Recall 2  Language- name 2 objects 2  Language- repeat 1  Language- follow 3 step command 3  Language- read & follow direction 1   Write a sentence 1  Copy design 1  Total score 29        03/21/2023   10:15 AM 03/17/2022    9:58 AM 03/11/2021    8:28 AM 02/12/2018    8:24 AM  6CIT Screen  What Year? 0 points 0 points 0 points 0 points  What month? 0 points 0 points 0 points 0 points  What time? 0 points 0 points 0 points 0 points  Count back from 20 0 points 0 points 0 points 0 points  Months in reverse 0 points 0 points 2 points 0 points  Repeat phrase 2 points 2 points 0 points 0 points  Total Score 2 points 2 points 2 points 0 points    Immunizations Immunization History  Administered Date(s) Administered   19-influenza Whole 10/24/2018   Fluad Quad(high Dose 65+) 10/24/2018, 03/09/2020   Fluad Trivalent(High Dose 65+) 11/27/2022   Influenza Whole 10/22/2015, 10/30/2016, 11/30/2016, 11/19/2017   Influenza, High Dose Seasonal PF 10/22/2015, 10/30/2016  Influenza,inj,Quad PF,6+ Mos 11/11/2013, 09/27/2017   Influenza-Unspecified 11/18/2014, 11/13/2020   PFIZER Comirnaty(Gray Top)Covid-19 Tri-Sucrose Vaccine 11/25/2019   PFIZER(Purple Top)SARS-COV-2 Vaccination 02/27/2019, 03/20/2019, 11/25/2019, 08/17/2020, 11/15/2021   Pfizer(Comirnaty)Fall Seasonal Vaccine 12 years and older 11/27/2022   Pneumococcal Conjugate-13 10/22/2015, 04/15/2019   Pneumococcal Polysaccharide-23 12/25/2012, 03/06/2017   Pneumococcal-Unspecified 12/25/2012, 03/06/2017   Tdap 07/26/2015, 06/25/2021   Zoster Recombinant(Shingrix) 03/26/2021   Zoster, Live 11/04/2015    TDAP status: Up to date  Flu Vaccine status: Up to date  Pneumococcal vaccine status: Due, Education has been provided regarding the importance of this vaccine. Advised may receive this vaccine at local pharmacy or Health Dept. Aware to provide a copy of the vaccination record if obtained from local pharmacy or Health Dept. Verbalized acceptance and understanding.  Covid-19 vaccine status: Completed vaccines  Qualifies for Shingles Vaccine? Yes   Zostavax  completed Yes   Shingrix Completed?: Yes  Screening Tests Health Maintenance  Topic Date Due   Diabetic kidney evaluation - Urine ACR  02/11/2015   Zoster Vaccines- Shingrix (2 of 2) 05/21/2021   OPHTHALMOLOGY EXAM  07/21/2021   FOOT EXAM  03/09/2022   HEMOGLOBIN A1C  08/25/2022   COVID-19 Vaccine (8 - 2024-25 season) 01/22/2023   Diabetic kidney evaluation - eGFR measurement  06/22/2023   Medicare Annual Wellness (AWV)  03/20/2024   DTaP/Tdap/Td (3 - Td or Tdap) 06/26/2031   Pneumonia Vaccine 21+ Years old  Completed   INFLUENZA VACCINE  Completed   DEXA SCAN  Completed   HPV VACCINES  Aged Out    Health Maintenance  Health Maintenance Due  Topic Date Due   Diabetic kidney evaluation - Urine ACR  02/11/2015   Zoster Vaccines- Shingrix (2 of 2) 05/21/2021   OPHTHALMOLOGY EXAM  07/21/2021   FOOT EXAM  03/09/2022   HEMOGLOBIN A1C  08/25/2022   COVID-19 Vaccine (8 - 2024-25 season) 01/22/2023    Colorectal cancer screening: No longer required.   Mammogram status: No longer required due to age.  Bone Density status: Completed 03/23/2021. Results reflect: Bone density results: OSTEOPENIA. Repeat every 2 years.Placed order   Lung Cancer Screening: (Low Dose CT Chest recommended if Age 53-80 years, 20 pack-year currently smoking OR have quit w/in 15years.) does qualify.   Lung Cancer Screening Referral: placed referral.   Additional Screening:  Hepatitis C Screening: does qualify; Completed not completed  Vision Screening: Recommended annual ophthalmology exams for early detection of glaucoma and other disorders of the eye. Is the patient up to date with their annual eye exam?  Yes  Who is the provider or what is the name of the office in which the patient attends annual eye exams? Maree Hua, MD If pt is not established with a provider, would they like to be referred to a provider to establish care?  N/a .   Dental Screening: Recommended annual dental exams for proper  oral hygiene  Diabetic Foot Exam: Diabetic Foot Exam: Completed Endocrinology does foot exams   Community Resource Referral / Chronic Care Management: CRR required this visit?  Yes   CCM required this visit?  No     Plan:     I have personally reviewed and noted the following in the patient's chart:   Medical and social history Use of alcohol, tobacco or illicit drugs  Current medications and supplements including opioid prescriptions. Patient is not currently taking opioid prescriptions. Functional ability and status Nutritional status Physical activity Advanced directives List of other physicians Hospitalizations, surgeries, and ER visits  in previous 12 months Vitals Screenings to include cognitive, depression, and falls Referrals and appointments  In addition, I have reviewed and discussed with patient certain preventive protocols, quality metrics, and best practice recommendations. A written personalized care plan for preventive services as well as general preventive health recommendations were provided to patient.     Bonny Jon Mayor, CMA   03/21/2023   After Visit Sumary: (Mail) Due to this being a telephonic visit, the after visit summary with patients personalized plan was offered to patient via mail   Nurse Notes:   Evangelina Delancey is here for [facility] Wellness Visits. She loves her dog Sully. She also enjoys crochet and gardening.   Referral for lung cancer screening today.  Pneumococcal 20 vaccine on next visit to the office.   Bone Density ordered.

## 2023-04-04 DIAGNOSIS — Z7901 Long term (current) use of anticoagulants: Secondary | ICD-10-CM | POA: Diagnosis not present

## 2023-04-04 DIAGNOSIS — I509 Heart failure, unspecified: Secondary | ICD-10-CM | POA: Diagnosis not present

## 2023-04-04 DIAGNOSIS — E039 Hypothyroidism, unspecified: Secondary | ICD-10-CM | POA: Diagnosis not present

## 2023-04-04 DIAGNOSIS — I700 Atherosclerosis of aorta: Secondary | ICD-10-CM | POA: Diagnosis not present

## 2023-04-04 DIAGNOSIS — K449 Diaphragmatic hernia without obstruction or gangrene: Secondary | ICD-10-CM | POA: Diagnosis not present

## 2023-04-04 DIAGNOSIS — I130 Hypertensive heart and chronic kidney disease with heart failure and stage 1 through stage 4 chronic kidney disease, or unspecified chronic kidney disease: Secondary | ICD-10-CM | POA: Diagnosis not present

## 2023-04-04 DIAGNOSIS — Q398 Other congenital malformations of esophagus: Secondary | ICD-10-CM | POA: Diagnosis not present

## 2023-04-04 DIAGNOSIS — Z794 Long term (current) use of insulin: Secondary | ICD-10-CM | POA: Diagnosis not present

## 2023-04-04 DIAGNOSIS — I272 Pulmonary hypertension, unspecified: Secondary | ICD-10-CM | POA: Diagnosis not present

## 2023-04-04 DIAGNOSIS — N1831 Chronic kidney disease, stage 3a: Secondary | ICD-10-CM | POA: Diagnosis not present

## 2023-04-04 DIAGNOSIS — Z8673 Personal history of transient ischemic attack (TIA), and cerebral infarction without residual deficits: Secondary | ICD-10-CM | POA: Diagnosis not present

## 2023-04-04 DIAGNOSIS — I4891 Unspecified atrial fibrillation: Secondary | ICD-10-CM | POA: Diagnosis not present

## 2023-04-04 DIAGNOSIS — E1022 Type 1 diabetes mellitus with diabetic chronic kidney disease: Secondary | ICD-10-CM | POA: Diagnosis not present

## 2023-04-04 DIAGNOSIS — K2289 Other specified disease of esophagus: Secondary | ICD-10-CM | POA: Diagnosis not present

## 2023-04-04 DIAGNOSIS — Z8701 Personal history of pneumonia (recurrent): Secondary | ICD-10-CM | POA: Diagnosis not present

## 2023-04-04 DIAGNOSIS — R1319 Other dysphagia: Secondary | ICD-10-CM | POA: Diagnosis not present

## 2023-04-05 DIAGNOSIS — N183 Chronic kidney disease, stage 3 unspecified: Secondary | ICD-10-CM | POA: Diagnosis not present

## 2023-04-05 DIAGNOSIS — E1022 Type 1 diabetes mellitus with diabetic chronic kidney disease: Secondary | ICD-10-CM | POA: Diagnosis not present

## 2023-04-11 ENCOUNTER — Ambulatory Visit: Payer: Medicare Other

## 2023-04-11 DIAGNOSIS — M8580 Other specified disorders of bone density and structure, unspecified site: Secondary | ICD-10-CM

## 2023-04-11 DIAGNOSIS — Z780 Asymptomatic menopausal state: Secondary | ICD-10-CM

## 2023-04-11 DIAGNOSIS — M810 Age-related osteoporosis without current pathological fracture: Secondary | ICD-10-CM | POA: Diagnosis not present

## 2023-04-13 ENCOUNTER — Encounter: Payer: Self-pay | Admitting: Family Medicine

## 2023-04-30 DIAGNOSIS — I5022 Chronic systolic (congestive) heart failure: Secondary | ICD-10-CM | POA: Diagnosis present

## 2023-04-30 DIAGNOSIS — Z86718 Personal history of other venous thrombosis and embolism: Secondary | ICD-10-CM | POA: Diagnosis not present

## 2023-04-30 DIAGNOSIS — E108 Type 1 diabetes mellitus with unspecified complications: Secondary | ICD-10-CM | POA: Diagnosis not present

## 2023-04-30 DIAGNOSIS — C154 Malignant neoplasm of middle third of esophagus: Secondary | ICD-10-CM | POA: Diagnosis not present

## 2023-04-30 DIAGNOSIS — N1831 Chronic kidney disease, stage 3a: Secondary | ICD-10-CM | POA: Diagnosis present

## 2023-04-30 DIAGNOSIS — R911 Solitary pulmonary nodule: Secondary | ICD-10-CM | POA: Diagnosis not present

## 2023-04-30 DIAGNOSIS — C159 Malignant neoplasm of esophagus, unspecified: Secondary | ICD-10-CM | POA: Diagnosis present

## 2023-04-30 DIAGNOSIS — Z9889 Other specified postprocedural states: Secondary | ICD-10-CM | POA: Diagnosis not present

## 2023-04-30 DIAGNOSIS — K219 Gastro-esophageal reflux disease without esophagitis: Secondary | ICD-10-CM | POA: Diagnosis present

## 2023-04-30 DIAGNOSIS — K22719 Barrett's esophagus with dysplasia, unspecified: Secondary | ICD-10-CM | POA: Diagnosis not present

## 2023-04-30 DIAGNOSIS — R1310 Dysphagia, unspecified: Secondary | ICD-10-CM | POA: Diagnosis not present

## 2023-04-30 DIAGNOSIS — E1122 Type 2 diabetes mellitus with diabetic chronic kidney disease: Secondary | ICD-10-CM | POA: Diagnosis present

## 2023-04-30 DIAGNOSIS — I4891 Unspecified atrial fibrillation: Secondary | ICD-10-CM | POA: Diagnosis present

## 2023-04-30 DIAGNOSIS — Z978 Presence of other specified devices: Secondary | ICD-10-CM | POA: Diagnosis not present

## 2023-04-30 DIAGNOSIS — Z9641 Presence of insulin pump (external) (internal): Secondary | ICD-10-CM | POA: Diagnosis present

## 2023-04-30 DIAGNOSIS — Z66 Do not resuscitate: Secondary | ICD-10-CM | POA: Diagnosis present

## 2023-04-30 DIAGNOSIS — E1065 Type 1 diabetes mellitus with hyperglycemia: Secondary | ICD-10-CM | POA: Diagnosis not present

## 2023-04-30 DIAGNOSIS — I272 Pulmonary hypertension, unspecified: Secondary | ICD-10-CM | POA: Diagnosis present

## 2023-04-30 DIAGNOSIS — Z7901 Long term (current) use of anticoagulants: Secondary | ICD-10-CM | POA: Diagnosis not present

## 2023-04-30 DIAGNOSIS — J90 Pleural effusion, not elsewhere classified: Secondary | ICD-10-CM | POA: Diagnosis not present

## 2023-04-30 DIAGNOSIS — E1165 Type 2 diabetes mellitus with hyperglycemia: Secondary | ICD-10-CM | POA: Diagnosis present

## 2023-04-30 DIAGNOSIS — R1314 Dysphagia, pharyngoesophageal phase: Secondary | ICD-10-CM | POA: Diagnosis not present

## 2023-04-30 DIAGNOSIS — R1319 Other dysphagia: Secondary | ICD-10-CM | POA: Diagnosis not present

## 2023-04-30 DIAGNOSIS — K2289 Other specified disease of esophagus: Secondary | ICD-10-CM | POA: Diagnosis not present

## 2023-04-30 DIAGNOSIS — Z8673 Personal history of transient ischemic attack (TIA), and cerebral infarction without residual deficits: Secondary | ICD-10-CM | POA: Diagnosis not present

## 2023-04-30 DIAGNOSIS — B029 Zoster without complications: Secondary | ICD-10-CM | POA: Diagnosis present

## 2023-04-30 DIAGNOSIS — G8918 Other acute postprocedural pain: Secondary | ICD-10-CM | POA: Diagnosis present

## 2023-04-30 DIAGNOSIS — I130 Hypertensive heart and chronic kidney disease with heart failure and stage 1 through stage 4 chronic kidney disease, or unspecified chronic kidney disease: Secondary | ICD-10-CM | POA: Diagnosis present

## 2023-04-30 DIAGNOSIS — Z794 Long term (current) use of insulin: Secondary | ICD-10-CM | POA: Diagnosis not present

## 2023-04-30 DIAGNOSIS — Z85828 Personal history of other malignant neoplasm of skin: Secondary | ICD-10-CM | POA: Diagnosis not present

## 2023-04-30 DIAGNOSIS — J9811 Atelectasis: Secondary | ICD-10-CM | POA: Diagnosis not present

## 2023-04-30 DIAGNOSIS — E039 Hypothyroidism, unspecified: Secondary | ICD-10-CM | POA: Diagnosis present

## 2023-04-30 DIAGNOSIS — E1069 Type 1 diabetes mellitus with other specified complication: Secondary | ICD-10-CM | POA: Diagnosis not present

## 2023-04-30 DIAGNOSIS — K449 Diaphragmatic hernia without obstruction or gangrene: Secondary | ICD-10-CM | POA: Diagnosis present

## 2023-04-30 DIAGNOSIS — H409 Unspecified glaucoma: Secondary | ICD-10-CM | POA: Diagnosis present

## 2023-04-30 DIAGNOSIS — R112 Nausea with vomiting, unspecified: Secondary | ICD-10-CM | POA: Diagnosis not present

## 2023-04-30 DIAGNOSIS — R0789 Other chest pain: Secondary | ICD-10-CM | POA: Diagnosis not present

## 2023-05-01 DIAGNOSIS — Z85828 Personal history of other malignant neoplasm of skin: Secondary | ICD-10-CM | POA: Diagnosis not present

## 2023-05-01 DIAGNOSIS — Z7901 Long term (current) use of anticoagulants: Secondary | ICD-10-CM | POA: Diagnosis not present

## 2023-05-01 DIAGNOSIS — Z9889 Other specified postprocedural states: Secondary | ICD-10-CM | POA: Diagnosis not present

## 2023-05-01 DIAGNOSIS — K2289 Other specified disease of esophagus: Secondary | ICD-10-CM | POA: Diagnosis not present

## 2023-05-01 DIAGNOSIS — Z9641 Presence of insulin pump (external) (internal): Secondary | ICD-10-CM | POA: Diagnosis present

## 2023-05-01 DIAGNOSIS — I5022 Chronic systolic (congestive) heart failure: Secondary | ICD-10-CM | POA: Diagnosis present

## 2023-05-01 DIAGNOSIS — C159 Malignant neoplasm of esophagus, unspecified: Secondary | ICD-10-CM | POA: Diagnosis present

## 2023-05-01 DIAGNOSIS — R112 Nausea with vomiting, unspecified: Secondary | ICD-10-CM | POA: Diagnosis not present

## 2023-05-01 DIAGNOSIS — Z66 Do not resuscitate: Secondary | ICD-10-CM | POA: Diagnosis present

## 2023-05-01 DIAGNOSIS — R1314 Dysphagia, pharyngoesophageal phase: Secondary | ICD-10-CM | POA: Diagnosis not present

## 2023-05-01 DIAGNOSIS — E108 Type 1 diabetes mellitus with unspecified complications: Secondary | ICD-10-CM | POA: Diagnosis not present

## 2023-05-01 DIAGNOSIS — R911 Solitary pulmonary nodule: Secondary | ICD-10-CM | POA: Diagnosis not present

## 2023-05-01 DIAGNOSIS — K219 Gastro-esophageal reflux disease without esophagitis: Secondary | ICD-10-CM | POA: Diagnosis present

## 2023-05-01 DIAGNOSIS — E1065 Type 1 diabetes mellitus with hyperglycemia: Secondary | ICD-10-CM | POA: Diagnosis not present

## 2023-05-01 DIAGNOSIS — I272 Pulmonary hypertension, unspecified: Secondary | ICD-10-CM | POA: Diagnosis present

## 2023-05-01 DIAGNOSIS — R0789 Other chest pain: Secondary | ICD-10-CM | POA: Diagnosis not present

## 2023-05-01 DIAGNOSIS — E039 Hypothyroidism, unspecified: Secondary | ICD-10-CM | POA: Diagnosis present

## 2023-05-01 DIAGNOSIS — N1831 Chronic kidney disease, stage 3a: Secondary | ICD-10-CM | POA: Diagnosis present

## 2023-05-01 DIAGNOSIS — I4891 Unspecified atrial fibrillation: Secondary | ICD-10-CM | POA: Diagnosis present

## 2023-05-01 DIAGNOSIS — R1310 Dysphagia, unspecified: Secondary | ICD-10-CM | POA: Diagnosis not present

## 2023-05-01 DIAGNOSIS — Z978 Presence of other specified devices: Secondary | ICD-10-CM | POA: Diagnosis not present

## 2023-05-01 DIAGNOSIS — G8918 Other acute postprocedural pain: Secondary | ICD-10-CM | POA: Diagnosis present

## 2023-05-01 DIAGNOSIS — J90 Pleural effusion, not elsewhere classified: Secondary | ICD-10-CM | POA: Diagnosis not present

## 2023-05-01 DIAGNOSIS — K449 Diaphragmatic hernia without obstruction or gangrene: Secondary | ICD-10-CM | POA: Diagnosis present

## 2023-05-01 DIAGNOSIS — Z8673 Personal history of transient ischemic attack (TIA), and cerebral infarction without residual deficits: Secondary | ICD-10-CM | POA: Diagnosis not present

## 2023-05-01 DIAGNOSIS — E1069 Type 1 diabetes mellitus with other specified complication: Secondary | ICD-10-CM | POA: Diagnosis not present

## 2023-05-01 DIAGNOSIS — H409 Unspecified glaucoma: Secondary | ICD-10-CM | POA: Diagnosis present

## 2023-05-01 DIAGNOSIS — Z794 Long term (current) use of insulin: Secondary | ICD-10-CM | POA: Diagnosis not present

## 2023-05-01 DIAGNOSIS — Z86718 Personal history of other venous thrombosis and embolism: Secondary | ICD-10-CM | POA: Diagnosis not present

## 2023-05-01 DIAGNOSIS — B029 Zoster without complications: Secondary | ICD-10-CM | POA: Diagnosis present

## 2023-05-01 DIAGNOSIS — R1319 Other dysphagia: Secondary | ICD-10-CM | POA: Diagnosis not present

## 2023-05-01 DIAGNOSIS — J9811 Atelectasis: Secondary | ICD-10-CM | POA: Diagnosis not present

## 2023-05-01 DIAGNOSIS — E1165 Type 2 diabetes mellitus with hyperglycemia: Secondary | ICD-10-CM | POA: Diagnosis present

## 2023-05-01 DIAGNOSIS — E1122 Type 2 diabetes mellitus with diabetic chronic kidney disease: Secondary | ICD-10-CM | POA: Diagnosis present

## 2023-05-01 DIAGNOSIS — I130 Hypertensive heart and chronic kidney disease with heart failure and stage 1 through stage 4 chronic kidney disease, or unspecified chronic kidney disease: Secondary | ICD-10-CM | POA: Diagnosis present

## 2023-05-02 DIAGNOSIS — R112 Nausea with vomiting, unspecified: Secondary | ICD-10-CM | POA: Diagnosis not present

## 2023-05-02 DIAGNOSIS — Z9641 Presence of insulin pump (external) (internal): Secondary | ICD-10-CM | POA: Diagnosis not present

## 2023-05-02 DIAGNOSIS — E1069 Type 1 diabetes mellitus with other specified complication: Secondary | ICD-10-CM | POA: Diagnosis not present

## 2023-05-02 DIAGNOSIS — E108 Type 1 diabetes mellitus with unspecified complications: Secondary | ICD-10-CM | POA: Diagnosis not present

## 2023-05-02 DIAGNOSIS — R1319 Other dysphagia: Secondary | ICD-10-CM | POA: Diagnosis not present

## 2023-05-02 DIAGNOSIS — E1065 Type 1 diabetes mellitus with hyperglycemia: Secondary | ICD-10-CM | POA: Diagnosis not present

## 2023-05-02 DIAGNOSIS — I4891 Unspecified atrial fibrillation: Secondary | ICD-10-CM | POA: Diagnosis not present

## 2023-05-02 DIAGNOSIS — Z9889 Other specified postprocedural states: Secondary | ICD-10-CM | POA: Diagnosis not present

## 2023-05-09 NOTE — Discharge Summary (Signed)
 Hospitalist  Discharge Summary   Name: Carmen Cooper Age: 83 yrs  MRN: [id number] DOB: 29-Jul-1940  Admit Date: 04/30/2023 Admitting Physician: Latisha Aurora, MD  Discharge Date: 05/09/2023 Discharge Physician: Francee Shown, MD   Admission Diagnosis:   S/P endoscopy [Z98.890] Chest pain [R07.9]   Discharge Diagnoses:   Principal Problem (Resolved):   Chest pain Active Problems: There are no active Hospital Problems.   TO DO List at Follow-up for PCP/Specialist:   Key Medication changes: PPI twice daily x 3 weeks and then once daily again.  Viscous lidocaine  before meals. Pending labs to follow up on: None Incidental findings requiring follow-up: None Other: None  Pending Labs     Order Current Status   POC Continuous Glucose Monitor - Patient Supplied Device In process   POC Continuous Glucose Monitor - Patient Supplied Device In process        Hospital Course:   For full details, please see H&P, progress notes, consult notes and ancillary notes. Briefly, Carmen Cooper is a 83 y.o. year old female with a PMH of uveitis, T2DM, hypothyroidism, essential HTN, CHF, atrial fibrillation (on eliquis ), CKD, GERD who presented for planned procedure with GI. 2/17 patient underwent esophageal endoscopic submucosal dissection and block of esophageal dysphagia she was admitted for postprocedure observation. Post procedure course complicated by intermittent epigastric pain and diarrhea.  No notes on file  Assessment & Plan  S/p ESD en block of esophageal dysplasia Odynophagia, poor PO intake - Underwent EGD with ESD 3/17 and was admitted for post-procedure observation. Hospitalization complicated by esophageal discomfort and poor PO intake  - Esophagram 3/18 without evidence of esophageal leak -CT chest obtained 3/23 due to continued concern regarding esophageal leak- no evidence of leak or hematoma. Plan - PPI 40mg  twice daily for at least the next 3 weeks - Advised her  to avoid very hot or very cold foods due to her discomfort - Continue liquid diet for now and slowly advance at home - Repeat EGD in 6 months (ordered) -Symptoms improved with viscous Lidocaine . -Discharging home today. -Pathology shows squamous cell carcinoma.  GI team has placed a referral for oncology follow-up.   Afib with RVR, resolved Heart rate elevated on occasion early during hospital stay.  Has been controlled for the last 48 hours. - Continue home eliquis  - Continue home cardizem    T2DM On insulin  pump at home - GMT follow-up patient.  Resuming home insulin  regimen at discharge.   HLD - continue statin Hx glaucoma - continue cosopt , Xalatan . Re-timed per patients request HTN - continue lisinopril  and metoprolol   GERD - continue prilosec Hx herpes zoster - continue PTA valtrex   Discharge Condition:   Disposition: Patient discharged to Home or Self Care in stable condition.  Diet at discharge: Adult Diet- Liquid; Full liquids  Activity at Discharge: Ambulate ad lib  Wound/Incision Care Instructions:   Photo of wound/incision:        Physical Exam at Discharge   BP 135/80 (BP Location: Left arm, Patient Position: Sitting)   Pulse 78   Temp 98.1 F (36.7 C) (Oral)   Resp 18   Ht 1.651 m (5' 5)   Wt 63.5 kg (140 lb)   SpO2 92%   BMI 23.30 kg/m  General Appearance:  alert, appears stated age, and cooperative Eyes:  conjunctivae/corneas clear. PERRL, EOM's intact. Fundi benign. Lungs:   clear to auscultation bilaterally Heart:   regular rate and rhythm, S1, S2 normal, no murmur, click, rub or  gallop Abdomen:   soft, non-tender; bowel sounds normal; no masses,  no organomegaly   Discharge Medications:       Medication List     START taking these medications    acetaminophen  325 mg tablet Commonly known as: TYLENOL  Take 2 tablets (650 mg total) by mouth every 6 (six) hours as needed for mild pain (1-3) or fever 100.4 F or GREATER.   lidocaine  2  % viscous solution Commonly known as: XYLOCAINE  Take 10 mL by mouth in the morning and 10 mL at noon and 10 mL in the evening. Take before meals.   pantoprazole  40 mg EC tablet Commonly known as: PROTONIX  Take 1 tablet (40 mg total) by mouth 2 (two) times a day for 21 days.       CHANGE how you take these medications    valACYclovir 1 gram tablet Commonly known as: VALTREX TAKE 1 TABLET DAILY What changed: Another medication with the same name was removed. Continue taking this medication, and follow the directions you see here.       CONTINUE taking these medications    atorvastatin  40 mg tablet Commonly known as: LIPITOR Take 40 mg by mouth Once Daily.   cholecalciferol 1,000 unit (25 mcg) tablet Commonly known as: VITAMIN D3 Take 1,000 Units by mouth daily.   cyanocobalamin 1,000 mcg tablet Commonly known as: VITAMIN B12 Take 1,000 mcg by mouth.   dilTIAZem  240 mg 24 hr capsule Commonly known as: CARDIZEM  CD Take 240 mg by mouth daily.   dorzolamide -timolol  (PF) 2-0.5 % Drop Administer 1 drop into each eyes 2 (two) times a day. Both eyes   Eliquis  5 mg Tab Generic drug: apixaban  Take 5 mg by mouth 2 (two) times a day.   folic acid  1 mg tablet Commonly known as: FOLVITE  Take 1 tablet (1,000 mcg total) by mouth daily.   insulin  lispro 100 unit/mL injection Commonly known as: HumaLOG  Inject into the skin via insulin  pump as directed (Discard after 28 days)   levothyroxine  sodium 88 mcg Cap Take 1 capsule by mouth daily.   lisinopriL  20 mg tablet Commonly known as: PRINIVIL  Take 10 mg by mouth Once Daily.   OMNIPOD 5 G6-G7 INTRO KT(GEN5) SUBQ Place 1 Application on the skin every third day.   Refresh Relieva 0.5-0.9 % Drop ophthalmic solution Generic drug: carboxymethylcellulose-glycerin Administer 1 drop into each eyes once as needed.   Systane Ultra 0.4-0.3 % Drop ophthalmic solution Generic drug: peg 400-propylene glycol Administer 1 drop into  each eyes once as needed.   Xiidra 5 % Dpet ophthalmic solution Generic drug: lifitegrast Administer 1 drop into each eyes 2 (two) times a day.       STOP taking these medications    dorzolamide -timoloL  22.3-6.8 mg/mL ophthalmic solution Commonly known as: COSOPT    mycophenolate  500 mg tablet Commonly known as: CELLCEPT    Nevanac 0.1 % Drps Generic drug: nepafenac   ofloxacin 0.3 % ophthalmic solution Commonly known as: OCUFLOX   prednisoLONE  acetate 1 % ophthalmic suspension Commonly known as: PRED FORTE    RABEprazole 20 mg DR tablet Commonly known as: ACIPHEX         Where to Get Your Medications     These medications were sent to [facility] [address] [place] [address]    Hours: Open Monday 12am to Friday 11:59pm; Sat-Sun: Closed; Holidays: Closed Thanksgiving Phone: [phone]  lidocaine  2 % viscous solution pantoprazole  40 mg EC tablet    You can  get these medications from any pharmacy   You don't need a prescription for these medications acetaminophen  325 mg tablet     Significant Diagnostic Tests:   LABS:  Lab Results  Component Value Date   WBC 5.40 05/06/2023   HGB 14.1 05/06/2023   HCT 41.7 05/06/2023   PLT 214 05/06/2023   ALT 15 05/01/2023   AST 15 05/01/2023   NA 139 05/09/2023   K 4.1 05/09/2023   CL 102 05/09/2023   CREATININE 0.91 05/09/2023   BUN 12 05/09/2023   CO2 30 05/09/2023   HGBA1C 7.8 (H) 05/01/2023   IMAGING:  CT Chest WO Contrast  Final Result by Mikal Ephriam Formica, MD (03/24 1148)  CT CHEST WO CONTRAST, 05/06/2023 10:51 PM    INDICATION:CT CHEST W ORAL CONTRAST r/o esophageal perforation, Other   dysphagia \ R13.19 Other dysphagia     COMPARISON: Esophagram 05/01/2023. Chest CT 05/05/2023    TECHNIQUE: Multislice axial images were obtained through the chest without   administration of iodinated intravenous contrast material. Multi-planar   reformatted images were generated  for additional analysis. Nongated   technique limits cardiac detail.    All CT scans at [facility] and [facility]   Imaging are performed using dose optimization techniques as appropriate to   a performed exam, including but not limited to one or more of the   following: automated exposure control, adjustment of the mA and/or kV   according to patient size, use of iterative reconstruction technique. In   addition, [facility] is participating in the ACR Dose Registry program which   will further assist us  in optimizing patient radiation exposure.    FINDINGS:     Thoracic inlet/central airways: Atrophic thyroid . Central airways are   patent. Partially visualized right cervical lymph node measures 1.9 cm   (series 3, image 6).  Upper abdomen: No acute findings in the upper abdomen.  Chest wall/MSK: Osteoporosis. Age-indeterminate mild superior endplate   compression deformities of T2 and T3.  Mediastinum/hila/axilla: Positive enteric contrast opacifies the   esophageal lumen. No evidence of extraluminal contrast extravasation.   Query esophageal diverticulum the upper thoracic esophagus (series 3,   image 23 and series 901, image 52). There are a few mildly enlarged   mediastinal lymph nodes, favored reactive.  Heart/vessels: Multichamber cardiomegaly with severe biatrial enlargement.   Small pericardial effusion. Mild aortic root and mitral annular   calcifications. There is no coronary artery calcification. The descending   thoracic aorta is dilated to 4.2 cm. Calcific aortic atherosclerosis.  Lungs/pleura: Left greater than right lower lobe dependent consolidation.   Small left greater than right pleural effusions. Similar 9 x 12   groundglass nodule in the right upper lobe (4/57). Respiratory motion   limits evaluation for smaller nodules. Mild biapical pleural parenchymal   scarring.      IMPRESSION:  1.  No evidence of esophageal perforation/leak.  Please note that   fluoroscopic esophagram is more sensitive.  2.  Similar small left greater than right pleural effusions with   associated passive atelectasis.  3.  Similar groundglass nodule in the right upper lobe which will require   follow-up chest CT in 6 months.  4.  Partially visualized right cervical lymph node measures 1.9 cm.   Consider dedicated imaging for further evaluation.  5.  Additional chronic ancillary findings as above.        CT Chest WO Contrast  Final Result by Glendia Darleene Pepper,  MD (03/23 1612)  CT CHEST WO CONTRAST, 05/05/2023 3:12 PM    INDICATION:Chest pain, nonspecific, Esophageal perforation, 82yo female   with esophageal lesion s/p ESD with odynophagia, please r/o esophageal   perforation, Other dysphagia \  \ \ R13.19 Other dysphagia     ADDITIONAL HISTORY: None.    COMPARISON: No comparison    TECHNIQUE: Multislice axial images were obtained through the chest without   administration of iodinated intravenous contrast material. Multi-planar   reformatted images were generated for additional analysis. Nongated   technique limits cardiac detail.    All CT scans at [facility] and [facility]   Imaging are performed using dose optimization techniques as appropriate to   a performed exam, including but not limited to one or more of the   following: automated exposure control, adjustment of the mA and/or kV   according to patient size, use of iterative reconstruction technique. In   addition, [facility] is participating in the ACR Dose Registry program which   will further assist us  in optimizing patient radiation exposure.    FINDINGS:     Thoracic inlet/central airways: Thyroid  normal. Airway patent.  Upper abdomen: Unremarkable as visualized.  Chest wall/MSK: Within normal limits.  Mediastinum/hila/axilla: No adenopathy. Difficult to evaluate the   esophagus without IV or oral contrast. No mediastinal air is seen around    the esophagus.  Heart/vessels: The descending thoracic aorta is dilated up to 4 cm,   appearing to taper to more normal size at the diaphragmatic hiatus.      Lungs/pleura: Bilateral pleural effusions exist. Associated compressive   atelectasis left greater than right.    Right upper lobe subsolid nodule (series 3 image 21) measuring 1 cm.    Calcified granulomas noted.  IMPRESSION:    1. Evaluation of the esophagus is suboptimal without IV or oral contrast.   There are postprocedural changes from esophageal dissection en block of   esophageal dysplasia.  2. Bilateral pleural effusions with associated compressive atelectasis.  3. Dilated descending thoracic aorta.  4. Subsolid right upper lobe pulmonary nodule measuring 1 cm. Recommend   repeat chest CT in 6 months.        FL Esophagram  Final Result by Manuelita Arlean Nevelyn Arnita, MD (03/18 1626)  FL ESOPHAGRAM, 05/01/2023 3:27 PM     INDICATION: reflux and pain post procedure, Other dysphagia \ R13.19 Other   dysphagia   COMPARISON: None.    TECHNIQUE:     Scout radiograph(s) of the abdomen and other necessary portions of the   torso were initially obtained. Fluoroscopic evaluation of the esophagus   was then performed after oral administration of water soluble contrast   media followed by barium contrast media.    FINDINGS:    . Scout: Cardiomegaly. Vascular congestion and mild diffuse interstitial   opacities. Left basilar consolidation and small left pleural effusion.   Trace right pleural effusion. Endoclips project over the mid to lower   third of the esophagus.   . Esophagus: No extraluminal contrast extravasation. Persistent   intraesophageal reflux at the level of the endoclips. Mild scattered   tertiary contractions. Distal esophagus deviates laterally to the left.  . Gastroesophageal junction: No large hiatal hernia. Gastroesophageal   reflux was observed.    IMPRESSION:  CONCLUSION:    1.  No evidence  of esophageal leak. Suggestion of postprocedural mucosal   edema as above.  2.  Distal esophagus deviates laterally to the left.  This was described on   outside esophagram dated 09/25/2022, however images are not available for   review. If there is clinical concern for hematoma causing mass effect,   recommend CT.  3.  No pneumomediastinum or pneumothorax.  4.  Cardiomegaly, pulmonary interstitial edema and small left greater than   right pleural effusions.  5.  Left basilar consolidation favoring atelectasis. Pneumonia could have   a similar appearance           CULTURES:  Results for orders placed or performed during the hospital encounter of 04/30/23 (from the past week)  Gastrointestinal Pathogen Profile, Stool, NAAT   Specimen: Stool, Per Rectum  Result Value Ref Range   Campylobacter PCR Not Detected Not Detected   Plesiomonas shigelloides PCR Not Detected Not Detected   Salmonella PCR Not Detected Not Detected   Vibrio PCR Not Detected Not Detected   Yersinia enterocolitica PCR Not Detected Not Detected   Enteroaggregative E coli PCR Not Detected Not Detected   Enteropathogenic E coli PCR Not Detected Not Detected   Enterotoxigenic E coli PCR Not Detected Not Detected   Shiga-Toxin-Producing E coli PCR Not Detected Not Detected   Shigella/Enteroinvasive E coli PCR Not Detected Not Detected   Cryptosporidium PCR Not Detected Not Detected   Cyclospora cayetanensis PCR Not Detected Not Detected   Entamoeba histolytica PCR Not Detected Not Detected   Giardia lamblia PCR Not Detected Not Detected   Adenovirus F 40/41 PCR Not Detected Not Detected   Astrovirus PCR Not Detected Not Detected   Norovirus GI/GII PCR Not Detected Not Detected   Rotavirus A PCR Not Detected Not Detected   Sapovirus PCR Not Detected Not Detected  Clostridioides difficile Antigen and Toxin EIA   Specimen: Stool, Per Rectum  Result Value Ref Range   Clostridium difficle Antigen/Toxin Interpretation      C. Diff. Toxin A/B EIA Negative Negative   Clostridium difficile antigens Negative Negative   PATHOLOGY:  OTHER:   Surgeries/Procedures:     Other procedures performed:   Consults:   IP CONSULT TO ENDOCRINOLOGY   Follow-up Appointments:     Future Appointments  Date Time Provider Department Center  05/17/2023  9:10 AM Paticia Allene Fairly, MD [facility] [place]         Electronically signed by: Francee Shown, MD 05/09/2023 3:37 PM Time spent on discharge: 42 minutes.

## 2023-05-10 ENCOUNTER — Telehealth: Payer: Self-pay

## 2023-05-10 DIAGNOSIS — E1021 Type 1 diabetes mellitus with diabetic nephropathy: Secondary | ICD-10-CM | POA: Diagnosis not present

## 2023-05-10 DIAGNOSIS — R350 Frequency of micturition: Secondary | ICD-10-CM | POA: Diagnosis not present

## 2023-05-10 DIAGNOSIS — Z1330 Encounter for screening examination for mental health and behavioral disorders, unspecified: Secondary | ICD-10-CM | POA: Diagnosis not present

## 2023-05-10 NOTE — Transitions of Care (Post Inpatient/ED Visit) (Signed)
 05/10/2023  Name: Carmen Cooper MRN: [id number] DOB: 1940-05-06  Today's TOC FU Call Status: Today's TOC FU Call Status:: Successful TOC FU Call Completed TOC FU Call Complete Date: 05/10/23 Patient's Name and Date of Birth confirmed.  Transition Care Management Follow-up Telephone Call Date of Discharge: 05/09/23 Discharge Facility: Other ([facility] Facility) Name of Other ([facility]) Discharge Facility: [facility] Type of Discharge: Inpatient Admission Primary Inpatient Discharge Diagnosis:: Chest pain How have you been since you were released from the hospital?: Same (Patient reports she is feeling weak related to hospital stay and having endoscopies prior to and during admission) Any questions or concerns?: No (Patient denies)  Items Reviewed: Did you receive and understand the discharge instructions provided?: Yes (patient states her son has discharge papers and advised TOC RN to discuss with her son) Medications obtained,verified, and reconciled?: No (patient states her son has discharge papers and advised TOC RN to discuss with her son) Medications Not Reviewed Reasons:: Other: (patient states her son manages medications and she is unable to review medications - states he comes twice a dayon the way to work and from work) Any new allergies since your discharge?: No Dietary orders reviewed?: Yes (Patient is eating soft foods) Type of Diet Ordered:: soft Do you have support at home?: Yes People in Home: child(ren), adult Name of Support/Comfort Primary Source: Son, Rachael Fee and daughter in law, Alcario Drought - son comes twice a day  Medications Reviewed Today: Medications Reviewed Today   Medications were not reviewed in this encounter     Home Care and Equipment/Supplies: Were Home Health Services Ordered?: No Any new equipment or medical supplies ordered?: No  Functional Questionnaire: Do you need assistance with bathing/showering or dressing?: No Do you need assistance with meal  preparation?: No (Patient states she is currently eating light) Do you need assistance with eating?: No Do you have difficulty maintaining continence: No Do you need assistance with getting out of bed/getting out of a chair/moving?: No Do you have difficulty managing or taking your medications?: Yes (patient reports son helps with medications and has her medication list - he fills weekly pill box)  Follow up appointments reviewed: PCP Follow-up appointment confirmed?: No (Patient states son will schedule) MD Provider Line Number:[phone] Given: No Specialist Hospital Follow-up appointment confirmed?: No Reason Specialist Follow-Up Not Confirmed:  (patient states oncology appointment is being scheduled-son has to schedule since he works) Do you need transportation to your follow-up appointment?: No (son drives patient to appointments) Do you understand care options if your condition(s) worsen?: Yes-patient verbalized understanding  SDOH Interventions Today    Flowsheet Row Most Recent Value  SDOH Interventions   Food Insecurity Interventions Intervention Not Indicated  Housing Interventions Intervention Not Indicated  Transportation Interventions Intervention Not Indicated  Utilities Interventions Intervention Not Indicated       Goals Addressed               This Visit's Progress     TOC Care Plan - Patient will report no readmissions in the next 30 days (pt-stated)        Current Barriers:  Medication management: Patient reports son manages medications - Son denied need to review  PCP Hospital Follow Up: Son declined assistance with scheduling PCP hospital follow up  RNCM Clinical Goal(s):  Patient will work with the Care Management team over the next 30 days to address Transition of Care Barriers: Medication Management, PCP Follow up Patient will take all medications exactly as prescribed and will call provider for  medication related questions as evidenced by patient/son  report and health record Patient/son will demonstrate understanding of rationale for each prescribed medication as evidenced by verbalization of understanding Patient will attend all scheduled medical appointments: Son declined assistance in scheduling PCP Follow Up: Son reports oncology appointment scheduled for 05/21/23  Interventions: Evaluation of current treatment plan related to  self management and patient's adherence to plan as established by provider  Transitions of Care:  New goal. Doctor Visits  - discussed the importance of doctor visits  Diabetes Interventions:  (Status:  New goal.) Short Term Goal Assessed patient's understanding of A1c goal: <7% Provided education to patient about basic DM disease process Discussed plans with patient for ongoing care management follow up and provided patient with direct contact information for care management team Lab Results  Component Value Date   HGBA1C 8.3 02/12/2023    Oncology:  (Status: New goal.) Short Term Goal Assessment of understanding of oncology diagnosis:  Reviewed upcoming provider appointments and treatment appointments Assessed available transportation to appointments and treatments. Has consistent/reliable transportation: Yes Assessed support system. Has consistent/reliable family or other support: Yes  Patient Goals/Self-Care Activities: Participate in Transition of Care Program/Attend [facility] scheduled calls Notify RN Care Manager of TOC call rescheduling needs Take all medications as prescribed Attend all scheduled provider appointments Call provider office for new concerns or questions   Follow Up Plan:  Telephone follow up appointment with care management team member scheduled for:  05/17/23 2pm The patient has been provided with contact information for the care management team and has been advised to call with any health related questions or concerns.           Hilbert Odor RN, CCM [place]   VBCI-Population Health RN Care Manager [phone]

## 2023-05-17 ENCOUNTER — Other Ambulatory Visit: Payer: Self-pay

## 2023-05-17 ENCOUNTER — Other Ambulatory Visit: Payer: Self-pay | Admitting: Cardiology

## 2023-05-17 DIAGNOSIS — H209 Unspecified iridocyclitis: Secondary | ICD-10-CM | POA: Diagnosis not present

## 2023-05-17 DIAGNOSIS — H353221 Exudative age-related macular degeneration, left eye, with active choroidal neovascularization: Secondary | ICD-10-CM | POA: Diagnosis not present

## 2023-05-17 DIAGNOSIS — H4041X1 Glaucoma secondary to eye inflammation, right eye, mild stage: Secondary | ICD-10-CM | POA: Diagnosis not present

## 2023-05-17 DIAGNOSIS — H3581 Retinal edema: Secondary | ICD-10-CM | POA: Diagnosis not present

## 2023-05-17 DIAGNOSIS — H353212 Exudative age-related macular degeneration, right eye, with inactive choroidal neovascularization: Secondary | ICD-10-CM | POA: Diagnosis not present

## 2023-05-17 DIAGNOSIS — H401120 Primary open-angle glaucoma, left eye, stage unspecified: Secondary | ICD-10-CM | POA: Diagnosis not present

## 2023-05-17 DIAGNOSIS — H30031 Focal chorioretinal inflammation, peripheral, right eye: Secondary | ICD-10-CM | POA: Diagnosis not present

## 2023-05-17 DIAGNOSIS — I4891 Unspecified atrial fibrillation: Secondary | ICD-10-CM

## 2023-05-17 DIAGNOSIS — H44111 Panuveitis, right eye: Secondary | ICD-10-CM | POA: Diagnosis not present

## 2023-05-17 DIAGNOSIS — R0789 Other chest pain: Secondary | ICD-10-CM

## 2023-05-17 DIAGNOSIS — Z961 Presence of intraocular lens: Secondary | ICD-10-CM | POA: Diagnosis not present

## 2023-05-17 DIAGNOSIS — Z79899 Other long term (current) drug therapy: Secondary | ICD-10-CM | POA: Diagnosis not present

## 2023-05-17 NOTE — Patient Instructions (Signed)
 Visit Information  Thank you for taking time to visit with me today. Please don't hesitate to contact me if I can be of assistance to you before our next scheduled telephone appointment.  Our next appointment is by telephone on 05/24/23 at 1pm  Following is a copy of your care plan:   Goals Addressed               This Visit's Progress     TOC Care Plan - Patient will report no readmissions in the next 30 days (pt-stated)        Current Barriers:  Medication management: Patient reports son manages medications - Son denied need to review (05/17/23 Patient attempted medication review but said she was unable to complete because son fills her pill box and comes very morning and evening and makes sure she has taken her medications) PCP Hospital Follow Up: Son declined assistance with scheduling PCP hospital follow up   RNCM Clinical Goal(s):  Patient will work with the Care Management team over the next 30 days to address Transition of Care Barriers: Medication Management, PCP Follow up Patient will take all medications exactly as prescribed and will call provider for medication related questions as evidenced by patient/son report and health record Patient/son will demonstrate understanding of rationale for each prescribed medication as evidenced by verbalization of understanding Patient will attend all scheduled medical appointments: Son declined assistance in scheduling PCP Follow Up: Son reports oncology appointment scheduled for 05/21/23 - patient confirmed during our 05/17/23 call  Interventions: Evaluation of current treatment plan related to  self management and patient's adherence to plan as established by provider  Transitions of Care:  Goal on track:  Yes. Doctor Visits  - discussed the importance of doctor visits  Diabetes Interventions:  (Status:  Goal on track:  Yes.) Short Term Goal Assessed patient's understanding of A1c goal: <7% Provided education to patient about basic DM  disease process Discussed plans with patient for ongoing care management follow up and provided patient with direct contact information for care management team Lab Results  Component Value Date   HGBA1C 8.3 02/12/2023    Oncology:  (Status: Goal on track:  Yes.) Short Term Goal Assessment of understanding of oncology diagnosis:  Assessed available transportation to appointments and treatments. Has consistent/reliable transportation: Yes Assessed support system. Has consistent/reliable family or other support: Yes  Patient Goals/Self-Care Activities: Participate in Transition of Care Program/Attend [facility] scheduled calls Notify RN Care Manager of TOC call rescheduling needs Take all medications as prescribed Attend all scheduled provider appointments Call provider office for new concerns or questions   Follow Up Plan:  Telephone follow up appointment with care management team member scheduled for:  05/24/23 1pm The patient has been provided with contact information for the care management team and has been advised to call with any health related questions or concerns.          Patient verbalizes understanding of instructions and care plan provided today and agrees to view in MyChart. Active MyChart status and patient understanding of how to access instructions and care plan via MyChart confirmed with patient.     Telephone follow up appointment with care management team member scheduled for: 05/24/23 1pm The patient has been provided with contact information for the care management team and has been advised to call with any health related questions or concerns.   Please call the care guide team at [phone] if you need to cancel or reschedule your appointment.  Please call the Suicide and Crisis Lifeline: 988 call the [place] National Suicide Prevention Lifeline: 925-635-9645 or TTY: 619 745 6264 TTY 281 182 5596) to talk to a trained counselor call 1-800-273-TALK (toll free, 24  hour hotline) call 911 if you are experiencing a Mental Health or Behavioral Health Crisis or need someone to talk to.  Hilbert Odor RN, CCM [place]  VBCI-Population Health RN Care Manager [phone]

## 2023-05-17 NOTE — Telephone Encounter (Signed)
 Weight has been fluctuating, however more readings still > 60 kg.  Will leave dose and continue to monitor every 3 months

## 2023-05-17 NOTE — Telephone Encounter (Signed)
 Prescription refill request for Eliquis received. Indication: afib  Last office visit:09/18/2022 Scr: 0.91, 05/09/2023 Age:  83 yo  Weight:  58.1 kg   Pt's weight has been fluctuating. Per dosing criteria pt qualifies for a dose change.

## 2023-05-17 NOTE — Patient Outreach (Signed)
 Care Management  Transitions of Care Program Transitions of Care Post-discharge week 2   05/17/2023 Name: Carmen Cooper MRN: [id number] DOB: [birth year]  Subjective: Carmen Cooper is a 83 y.o. year old female who is a primary care patient of Everrett Coombe, DO. The Care Management team Engaged with patient Engaged with patient by telephone to assess and address transitions of care needs.   Consent to Services:  Patient was given information about care management services, agreed to services, and gave verbal consent to participate.   Assessment:     Patient reports she is seeing MD 05/21/23 and son is taking her. Patient states son continues to manage her medications and she is unable to complete medication review. Son previously denied need to review medications with [facility] RN. Discussed foods that may cause worsening heartburn symptoms and Educated on PPI. Patient denies chest pain other than the heartburn. Patient agreeable to [facility] RN follow up call next week to discuss outcome of MD appointment and plan      SDOH Interventions    Flowsheet Row Telephone from 05/10/2023 in [place] [facility]PART[facility] Clinical Support from 03/21/2023 in [facility] Primary Care & Sports Medicine at [facility] Telephone from 11/27/2022 in [facility] Community Care Coordination Telephone from 06/12/2022 in [facility] Care Coordination Office Visit from 03/11/2021 in [facility] Primary Care & Sports Medicine at [employer] Office Visit from 09/06/2020 in [facility] Primary Care & Sports Medicine at [facility]  SDOH Interventions        Food Insecurity Interventions Intervention Not Indicated Intervention Not Indicated Intervention Not Indicated Intervention Not Indicated Intervention Not Indicated --  Housing Interventions Intervention Not Indicated Intervention Not Indicated -- Intervention Not Indicated  [reports lives alone in town home]  Intervention Not Indicated --  Transportation Interventions Intervention Not Indicated Intervention Not Indicated Intervention Not Indicated  [son and daughter-in-law provide transportation] Intervention Not Indicated  [friends/ son provides transportation] Intervention Not Indicated --  Utilities Interventions Intervention Not Indicated Intervention Not Indicated -- -- -- --  Alcohol Usage Interventions -- Intervention Not Indicated (Score <7) -- -- -- --  Depression Interventions/Treatment  -- -- -- -- PHQ2-9 Score <4 Follow-up Not Indicated Medication  Financial Strain Interventions -- Intervention Not Indicated -- -- Intervention Not Indicated --  Physical Activity Interventions -- Intervention Not Indicated -- -- Intervention Not Indicated --  Stress Interventions -- Intervention Not Indicated -- -- Intervention Not Indicated --  Social Connections Interventions -- Other (Comment)  [She is [place] with friend and neighbors.] -- -- Intervention Not Indicated --  Health Literacy Interventions -- Intervention Not Indicated -- -- -- --        Goals Addressed               This Visit's Progress     TOC Care Plan - Patient will report no readmissions in the next 30 days (pt-stated)        Current Barriers:  Medication management: Patient reports son manages medications - Son denied need to review (05/17/23 Patient attempted medication review but said she was unable to complete because son fills her pill box and comes very morning and evening and makes sure she has taken her medications) PCP Hospital Follow Up: Son declined assistance with scheduling PCP hospital follow up   RNCM Clinical Goal(s):  Patient will work with the Care Management team over the next 30 days to address Transition of Care Barriers: Medication Management, PCP Follow up Patient  will take all medications exactly as prescribed and will call provider for medication related questions as evidenced by patient/son report and  health record Patient/son will demonstrate understanding of rationale for each prescribed medication as evidenced by verbalization of understanding Patient will attend all scheduled medical appointments: Son declined assistance in scheduling PCP Follow Up: Son reports oncology appointment scheduled for 05/21/23 - patient confirmed during our 05/17/23 call  Interventions: Evaluation of current treatment plan related to  self management and patient's adherence to plan as established by provider  Transitions of Care:  Goal on track:  Yes. Doctor Visits  - discussed the importance of doctor visits  Diabetes Interventions:  (Status:  Goal on track:  Yes.) Short Term Goal Assessed patient's understanding of A1c goal: <7% Provided education to patient about basic DM disease process Discussed plans with patient for ongoing care management follow up and provided patient with direct contact information for care management team Lab Results  Component Value Date   HGBA1C 8.3 02/12/2023    Oncology:  (Status: Goal on track:  Yes.) Short Term Goal Assessment of understanding of oncology diagnosis:  Assessed available transportation to appointments and treatments. Has consistent/reliable transportation: Yes Assessed support system. Has consistent/reliable family or other support: Yes  Patient Goals/Self-Care Activities: Participate in Transition of Care Program/Attend [facility] scheduled calls Notify RN Care Manager of TOC call rescheduling needs Take all medications as prescribed Attend all scheduled provider appointments Call provider office for new concerns or questions   Follow Up Plan:  Telephone follow up appointment with care management team member scheduled for:  05/24/23 1pm The patient has been provided with contact information for the care management team and has been advised to call with any health related questions or concerns.          Plan: Telephone follow up appointment with care  management team member scheduled for: 05/24/23 1pm The patient has been provided with contact information for the care management team and has been advised to call with any health related questions or concerns.   Hilbert Odor RN, CCM [place]  VBCI-Population Health RN Care Manager [phone]

## 2023-05-21 DIAGNOSIS — R12 Heartburn: Secondary | ICD-10-CM | POA: Diagnosis not present

## 2023-05-21 DIAGNOSIS — C159 Malignant neoplasm of esophagus, unspecified: Secondary | ICD-10-CM | POA: Diagnosis not present

## 2023-05-21 DIAGNOSIS — C154 Malignant neoplasm of middle third of esophagus: Secondary | ICD-10-CM | POA: Diagnosis not present

## 2023-05-24 ENCOUNTER — Other Ambulatory Visit: Payer: Self-pay

## 2023-05-24 NOTE — Patient Outreach (Addendum)
 Transition of Care week 3  Visit Note  05/24/2023  Name: Carmen Cooper MRN: [id number]          DOB: 1940-02-15  Situation: Patient enrolled in [facility] 30-day program. Visit completed with patient by telephone.   Background: Patient has been followed by [facility] RN for 05/09/23 hospital discharge S/p ESD en block of esophageal dysplasia - pathology Malignant neoplasm of middle third of esophagus. Patient seen by oncology 05/21/23 with Carafate prescribed for ongoing discomfort. TOC RN has been unable to review medications as patient reports difficulty seeing labels/son manages medications but is at work during calls. Patient with Diabetes Type 1 with Dexcom and Omnipod and states she gets alerts of abnormal blood sugar and he will go to her home in the middle of the night if he gets and alert. Patient feels she is doing well and denied need for ongoing TOC calls. TOC program is being closed today. Patient understands she can call with questions/concerns.   Initial Transition Care Management Follow-up Telephone Call    Past Medical History:  Diagnosis Date   Atrial fibrillation (HCC)    BCC (basal cell carcinoma of skin)    Diabetes (HCC)    Glaucoma    History of TIA (transient ischemic attack) 08/11/2013   12/2012 - Dr. Fara Olden    Hypertension    Hypothyroidism 08/11/2013   Memory changes    Microscopic colitis 08/21/2013   2008 [facility] Dr. Myra Gianotti.  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 Loraine Grip, [facility]     Assessment: Patient Reported Symptoms:  Cognitive Alert and oriented to person, place, and time, Normal speech and language skills  Neurological No symptoms reported    HEENT Other: History impaired vision Right eye - gets injections every 6 weeks left eye  Cardiovascular No symptoms reported    Respiratory No symptoms reported    Endocrine No symptoms reported    Gastrointestinal No symptoms  reported    Genitourinary No symptoms reported    Integumentary No symptoms reported    Musculoskeletal No symptoms reported    Psychosocial No symptoms reported     Vitals:   05/21/23 1029  BP: 132/65  Pulse: (!) 50  Temp: (!) 97 F (36.1 C)  SpO2: 100%    Goals Addressed               This Visit's Progress     COMPLETED: TOC Care Plan - Patient will report no readmissions in the next 30 days (pt-stated)        Current Barriers:  Medication management: Patient reports son manages medications - Son denied need to review (05/17/23 Patient attempted medication review but said she was unable to complete because son fills her pill box and comes very morning and evening and makes sure she has taken her medications) PCP Hospital Follow Up: Son declined assistance with scheduling PCP hospital follow up   RNCM Clinical Goal(s):  Patient will work with the Care Management team over the next 30 days to address Transition of Care Barriers: Medication Management, PCP Follow up Patient will take all medications exactly as prescribed and will call provider for medication related questions as evidenced by patient/son report and health record Patient/son will demonstrate understanding of rationale for each prescribed medication as evidenced by verbalization of understanding Patient will attend all scheduled medical appointments: Son declined assistance in scheduling PCP Follow Up: Son  reports oncology appointment scheduled for 05/21/23 - patient confirmed during our 05/17/23 call  Interventions: Evaluation of current treatment plan related to  self management and patient's adherence to plan as established by provider  Transitions of Care:  Goal on track:  Yes. Doctor Visits  - discussed the importance of doctor visits  Diabetes Interventions:  (Status:  Goal on track:  Yes.) Short Term Goal Assessed patient's understanding of A1c goal: <7% Provided education to patient about basic DM disease  process Discussed plans with patient for ongoing care management follow up and provided patient with direct contact information for care management team Lab Results  Component Value Date   HGBA1C 8.3 02/12/2023    Oncology:  (Status: Goal on track:  Yes.) Short Term Goal Assessment of understanding of oncology diagnosis:  Assessed available transportation to appointments and treatments. Has consistent/reliable transportation: Yes Assessed support system. Has consistent/reliable family or other support: Yes  Patient Goals/Self-Care Activities: Participate in Transition of Care Program/Attend [facility] scheduled calls Notify RN Care Manager of TOC call rescheduling needs Take all medications as prescribed Attend all scheduled provider appointments Call provider office for new concerns or questions   Follow Up Plan:  Telephone follow up appointment with care management team member scheduled for:  05/24/23 1pm The patient has been provided with contact information for the care management team and has been advised to call with any health related questions or concerns.        VBCI Transitions of Care (TOC) Care Plan        Problems:  Recent Hospitalization for treatment of Chest pain Unable to review medications - Patient reports her son manages medications due to patient difficulty seeing labels and son at work during calls.  Goal:  Over the next 30 days, the patient will not experience hospital readmission  Interventions:   Heart Failure Interventions: Basic overview and discussion of pathophysiology of Heart Failure reviewed Provided education on low sodium diet Discussed importance of daily weight and advised patient to weigh and record daily  Oncology: Assessment of understanding of oncology diagnosis:  Reviewed upcoming provider appointments and treatment appointments Assessed available transportation to appointments and treatments. Has consistent/reliable transportation:  Yes Assessed support system. Has consistent/reliable family or other support: Yes  Patient Self Care Activities:  Attend all scheduled provider appointments Call pharmacy for medication refills 3-7 days in advance of running out of medications Call provider office for new concerns or questions  Notify RN Care Manager of TOC call rescheduling needs Participate in Transition of Care Program/Attend TOC scheduled calls Take medications as prescribed   call office if I gain more than 2 pounds in one day or 5 pounds in one week use salt in moderation watch for swelling in feet, ankles and legs every day weigh myself daily eat more whole grains, fruits and vegetables, lean meats and healthy fats  Plan:  No further follow up required: Patient feels she is doing well. Son is handling her medications. Patient has seen oncologist and no plan for chemo/radiation reported The patient has been provided with contact information for the care management team and has been advised to call with any health related questions or concerns.          Recommendation:   Patient will continue with follow up as directed by Oncology, GI, PCP - Patient states son handles appointments  Follow Up Plan:   Patient has denied need for continued TOC calls.  Care Management case will be closed. Patient has  been provided contact information should new needs arise.   Hilbert Odor RN, CCM [place]  VBCI-Population Health RN Care Manager [phone]

## 2023-06-13 DIAGNOSIS — Z794 Long term (current) use of insulin: Secondary | ICD-10-CM | POA: Diagnosis not present

## 2023-06-13 DIAGNOSIS — H4043X3 Glaucoma secondary to eye inflammation, bilateral, severe stage: Secondary | ICD-10-CM | POA: Diagnosis not present

## 2023-06-13 DIAGNOSIS — H353212 Exudative age-related macular degeneration, right eye, with inactive choroidal neovascularization: Secondary | ICD-10-CM | POA: Diagnosis not present

## 2023-06-13 DIAGNOSIS — E119 Type 2 diabetes mellitus without complications: Secondary | ICD-10-CM | POA: Diagnosis not present

## 2023-06-13 DIAGNOSIS — H209 Unspecified iridocyclitis: Secondary | ICD-10-CM | POA: Diagnosis not present

## 2023-06-13 DIAGNOSIS — Z961 Presence of intraocular lens: Secondary | ICD-10-CM | POA: Diagnosis not present

## 2023-06-25 DIAGNOSIS — H209 Unspecified iridocyclitis: Secondary | ICD-10-CM | POA: Diagnosis not present

## 2023-06-25 DIAGNOSIS — H30031 Focal chorioretinal inflammation, peripheral, right eye: Secondary | ICD-10-CM | POA: Diagnosis not present

## 2023-06-25 DIAGNOSIS — H4041X1 Glaucoma secondary to eye inflammation, right eye, mild stage: Secondary | ICD-10-CM | POA: Diagnosis not present

## 2023-06-25 DIAGNOSIS — H353221 Exudative age-related macular degeneration, left eye, with active choroidal neovascularization: Secondary | ICD-10-CM | POA: Diagnosis not present

## 2023-06-25 DIAGNOSIS — H44111 Panuveitis, right eye: Secondary | ICD-10-CM | POA: Diagnosis not present

## 2023-06-25 DIAGNOSIS — Z79899 Other long term (current) drug therapy: Secondary | ICD-10-CM | POA: Diagnosis not present

## 2023-06-25 DIAGNOSIS — H353212 Exudative age-related macular degeneration, right eye, with inactive choroidal neovascularization: Secondary | ICD-10-CM | POA: Diagnosis not present

## 2023-06-25 DIAGNOSIS — H401120 Primary open-angle glaucoma, left eye, stage unspecified: Secondary | ICD-10-CM | POA: Diagnosis not present

## 2023-06-25 DIAGNOSIS — H3581 Retinal edema: Secondary | ICD-10-CM | POA: Diagnosis not present

## 2023-06-25 DIAGNOSIS — Z961 Presence of intraocular lens: Secondary | ICD-10-CM | POA: Diagnosis not present

## 2023-06-29 ENCOUNTER — Telehealth: Payer: Self-pay | Admitting: Family Medicine

## 2023-06-29 NOTE — Telephone Encounter (Signed)
 Copied from CRM [id number]. Topic: General - Other >> Jun 29, 2023  3:58 PM Kevelyn M wrote: Reason for CRM: Carolynne Citron with [facility] called into verify if we've receive a fax for a neurological screening for Dr. Augustus Ledger to sign. Call was dropped.

## 2023-07-07 DIAGNOSIS — H538 Other visual disturbances: Secondary | ICD-10-CM | POA: Diagnosis not present

## 2023-07-07 DIAGNOSIS — H539 Unspecified visual disturbance: Secondary | ICD-10-CM | POA: Diagnosis not present

## 2023-07-07 DIAGNOSIS — Z947 Corneal transplant status: Secondary | ICD-10-CM | POA: Diagnosis not present

## 2023-07-11 DIAGNOSIS — Z961 Presence of intraocular lens: Secondary | ICD-10-CM | POA: Diagnosis not present

## 2023-07-11 DIAGNOSIS — H209 Unspecified iridocyclitis: Secondary | ICD-10-CM | POA: Diagnosis not present

## 2023-07-11 DIAGNOSIS — H4043X3 Glaucoma secondary to eye inflammation, bilateral, severe stage: Secondary | ICD-10-CM | POA: Diagnosis not present

## 2023-07-11 DIAGNOSIS — H353212 Exudative age-related macular degeneration, right eye, with inactive choroidal neovascularization: Secondary | ICD-10-CM | POA: Diagnosis not present

## 2023-07-12 DIAGNOSIS — H353221 Exudative age-related macular degeneration, left eye, with active choroidal neovascularization: Secondary | ICD-10-CM | POA: Diagnosis not present

## 2023-07-12 DIAGNOSIS — H353212 Exudative age-related macular degeneration, right eye, with inactive choroidal neovascularization: Secondary | ICD-10-CM | POA: Diagnosis not present

## 2023-07-12 DIAGNOSIS — H401120 Primary open-angle glaucoma, left eye, stage unspecified: Secondary | ICD-10-CM | POA: Diagnosis not present

## 2023-07-12 DIAGNOSIS — Z79899 Other long term (current) drug therapy: Secondary | ICD-10-CM | POA: Diagnosis not present

## 2023-07-12 DIAGNOSIS — H4041X1 Glaucoma secondary to eye inflammation, right eye, mild stage: Secondary | ICD-10-CM | POA: Diagnosis not present

## 2023-07-12 DIAGNOSIS — H209 Unspecified iridocyclitis: Secondary | ICD-10-CM | POA: Diagnosis not present

## 2023-07-12 DIAGNOSIS — H3581 Retinal edema: Secondary | ICD-10-CM | POA: Diagnosis not present

## 2023-07-12 DIAGNOSIS — Z961 Presence of intraocular lens: Secondary | ICD-10-CM | POA: Diagnosis not present

## 2023-07-12 DIAGNOSIS — H30031 Focal chorioretinal inflammation, peripheral, right eye: Secondary | ICD-10-CM | POA: Diagnosis not present

## 2023-07-13 DIAGNOSIS — C154 Malignant neoplasm of middle third of esophagus: Secondary | ICD-10-CM | POA: Diagnosis not present

## 2023-07-23 DIAGNOSIS — H30031 Focal chorioretinal inflammation, peripheral, right eye: Secondary | ICD-10-CM | POA: Diagnosis not present

## 2023-07-23 DIAGNOSIS — H44111 Panuveitis, right eye: Secondary | ICD-10-CM | POA: Diagnosis not present

## 2023-07-23 DIAGNOSIS — H401120 Primary open-angle glaucoma, left eye, stage unspecified: Secondary | ICD-10-CM | POA: Diagnosis not present

## 2023-07-23 DIAGNOSIS — H3581 Retinal edema: Secondary | ICD-10-CM | POA: Diagnosis not present

## 2023-07-23 DIAGNOSIS — Z961 Presence of intraocular lens: Secondary | ICD-10-CM | POA: Diagnosis not present

## 2023-07-23 DIAGNOSIS — H4041X1 Glaucoma secondary to eye inflammation, right eye, mild stage: Secondary | ICD-10-CM | POA: Diagnosis not present

## 2023-07-23 DIAGNOSIS — H353221 Exudative age-related macular degeneration, left eye, with active choroidal neovascularization: Secondary | ICD-10-CM | POA: Diagnosis not present

## 2023-07-23 DIAGNOSIS — Z79899 Other long term (current) drug therapy: Secondary | ICD-10-CM | POA: Diagnosis not present

## 2023-07-23 DIAGNOSIS — H209 Unspecified iridocyclitis: Secondary | ICD-10-CM | POA: Diagnosis not present

## 2023-07-23 DIAGNOSIS — H353212 Exudative age-related macular degeneration, right eye, with inactive choroidal neovascularization: Secondary | ICD-10-CM | POA: Diagnosis not present

## 2023-07-24 DIAGNOSIS — Z7901 Long term (current) use of anticoagulants: Secondary | ICD-10-CM | POA: Diagnosis not present

## 2023-07-24 DIAGNOSIS — Z87891 Personal history of nicotine dependence: Secondary | ICD-10-CM | POA: Diagnosis not present

## 2023-07-24 DIAGNOSIS — K219 Gastro-esophageal reflux disease without esophagitis: Secondary | ICD-10-CM | POA: Diagnosis not present

## 2023-07-24 DIAGNOSIS — D001 Carcinoma in situ of esophagus: Secondary | ICD-10-CM | POA: Diagnosis not present

## 2023-07-24 DIAGNOSIS — Z794 Long term (current) use of insulin: Secondary | ICD-10-CM | POA: Diagnosis not present

## 2023-07-24 DIAGNOSIS — I272 Pulmonary hypertension, unspecified: Secondary | ICD-10-CM | POA: Diagnosis not present

## 2023-07-24 DIAGNOSIS — Z9889 Other specified postprocedural states: Secondary | ICD-10-CM | POA: Diagnosis not present

## 2023-07-24 DIAGNOSIS — I130 Hypertensive heart and chronic kidney disease with heart failure and stage 1 through stage 4 chronic kidney disease, or unspecified chronic kidney disease: Secondary | ICD-10-CM | POA: Diagnosis not present

## 2023-07-24 DIAGNOSIS — E119 Type 2 diabetes mellitus without complications: Secondary | ICD-10-CM | POA: Diagnosis not present

## 2023-07-24 DIAGNOSIS — K2210 Ulcer of esophagus without bleeding: Secondary | ICD-10-CM | POA: Diagnosis not present

## 2023-07-24 DIAGNOSIS — K2289 Other specified disease of esophagus: Secondary | ICD-10-CM | POA: Diagnosis not present

## 2023-07-24 DIAGNOSIS — E039 Hypothyroidism, unspecified: Secondary | ICD-10-CM | POA: Diagnosis not present

## 2023-07-24 DIAGNOSIS — I700 Atherosclerosis of aorta: Secondary | ICD-10-CM | POA: Diagnosis not present

## 2023-07-24 DIAGNOSIS — Z79899 Other long term (current) drug therapy: Secondary | ICD-10-CM | POA: Diagnosis not present

## 2023-07-24 DIAGNOSIS — I4891 Unspecified atrial fibrillation: Secondary | ICD-10-CM | POA: Diagnosis not present

## 2023-07-24 DIAGNOSIS — N1831 Chronic kidney disease, stage 3a: Secondary | ICD-10-CM | POA: Diagnosis not present

## 2023-08-07 DIAGNOSIS — H44111 Panuveitis, right eye: Secondary | ICD-10-CM | POA: Diagnosis not present

## 2023-08-07 DIAGNOSIS — Z961 Presence of intraocular lens: Secondary | ICD-10-CM | POA: Diagnosis not present

## 2023-08-07 DIAGNOSIS — H30031 Focal chorioretinal inflammation, peripheral, right eye: Secondary | ICD-10-CM | POA: Diagnosis not present

## 2023-08-07 DIAGNOSIS — Z79899 Other long term (current) drug therapy: Secondary | ICD-10-CM | POA: Diagnosis not present

## 2023-08-07 DIAGNOSIS — H3581 Retinal edema: Secondary | ICD-10-CM | POA: Diagnosis not present

## 2023-08-07 DIAGNOSIS — H401120 Primary open-angle glaucoma, left eye, stage unspecified: Secondary | ICD-10-CM | POA: Diagnosis not present

## 2023-08-07 DIAGNOSIS — H209 Unspecified iridocyclitis: Secondary | ICD-10-CM | POA: Diagnosis not present

## 2023-08-07 DIAGNOSIS — H353211 Exudative age-related macular degeneration, right eye, with active choroidal neovascularization: Secondary | ICD-10-CM | POA: Diagnosis not present

## 2023-08-07 DIAGNOSIS — H353221 Exudative age-related macular degeneration, left eye, with active choroidal neovascularization: Secondary | ICD-10-CM | POA: Diagnosis not present

## 2023-08-07 DIAGNOSIS — H4041X1 Glaucoma secondary to eye inflammation, right eye, mild stage: Secondary | ICD-10-CM | POA: Diagnosis not present

## 2023-08-07 NOTE — Progress Notes (Signed)
 CHIEF COMPLAINT Chief Complaint  Patient presents with  . Retina Evaluation   HISTORY OF PRESENT ILLNESS: Carmen Cooper is a 83 y.o. y.o. old female who presents to the clinic today for follow up for peripheral focal chorioretinal inflammation OD. She was a previous patient of Dr. Edyth. She also has hx of uveitic glaucoma OD and pseudophakia OU. She is s/p DSEK OD 06/09/20 with Dr. Elester. She is s/p IVA OS 08/23/20, s/p IVE OS #12 11/09/22, and s/p IVE HD OS #2 on 05/17/23. She is currently on 1 mg folic acid  daily, and Valtrex 1 g daily.  Today the patient reports after her injection in the left eye 07/23/23 she has pain in the right eye. She has not been using artificial tears or drops.   She denies ocular pain, flashes, floaters and other concerns at this time.  Denies any fevers, weight loss, night sweats.   Review of Systems:   Constitutional symptoms: negative Eyes:  negative Ear, nose, throat:  negative Cardiovascular:  negative Respiratory:  negative Gastrointestinal:  negative Genitourinary:  negative Skin:  negative Neurological:  negative Musculoskeletal:  negative Psychiatric:  negative Endocrine:  negative Hematological:  negative Allergic:  negative   Selected notes from the MEDICAL RECORD NUMBER Per Dr. Pearline on 07/12/23  Peripheral focal chorioretinal inflammation - I agree with Dr. Nilsa excellent exam. She has active panuveitis/focal chorioretinitis - FA 01/05/17 demonstrates papillitis and petaloid leakage of the macula OD - Initial labwork 01/05/17 - show elevated Anti-MPO with negative C and P ANCA, otherwise unremarkable. HLA-B7 and HLA-B60 positive. HLA-A29, HLA-B27, and HLA-B51 negative - I recommend Methotrexate . R/B discussed.  - Humira paperwork filled out 04/12/17 - Consider Retisert 3 year steroid implant, discussed briefly, 06/04/17  - Increased Cyclosporine  50 mg BID to 100 mg BID 10/18/17  - D/c Humira 40mg  injections due to diarrhea  - Consider IVK  while ramping up IMT, though she does now have an insulin  pump - May proceed with Retisert if retinal edema does not improve   - Increased Methotrexate  20 mg weekly to 25 mg weekly  - I have reviewed that systemic immune suppression requires monitoring with high risk labs as there is always the potential for changes in hepatic and hematologic or renal parameters that can be life threatening.  - Patient recently discharged for pneumonia and finished antibiotics (05/01/19) - Decreased 25 mg MTX weekly to 15 mg weekly on 05/01/19 - Decreased Cyclosporine  100 mg BID to 50 mg BID on 05/01/19  - Re-discussed Retisert on 07/17/19 - Increased 15 mg MTX weekly to 25 mg weekly on 07/17/19  - Decreased 25 mg MTX weekly to 15 mg weekly due to elevated liver enzymes on 09/11/19 - Of note patient is on Eloquis  - D/c 15 mg MTX weekly to 10 mg weekly due to slight elevation of liver enzymes on 11/20/19 - D/c 50 mg Cyclosporine  BID due to being on Eloquis on 11/20/19 - I recommend starting Cellcept . I have discussed side effects and patient consents to proceed on 11/20/19 - In addition to increasing Cellcept , I have also discussed she may qualify for clinical trial. I discussed nature of this particular trial and that it would involve local injection into the eye. I have discussed r/b and need for screening procedure to determine if she is eligible. Patient is interested in clinical trial (01/15/20) - Increased Cellcept  500 mg BID (started on 11/20/19) to 1000 mg BID on 01/15/20 - BCL OD removed and replaced on 02/16/20 -  I will contemplate adding low-dose Rapamune at next visit (02/16/20) - Corneal edema OD noted on exam on 02/16/20. I will refer to Dr. Elester for cornea evaluation (02/16/20) - planning for DSAEK OD with Dr. Elester on 06/09/20 - Increased Cellcept  1000 mg BID to 1500 mg BID on 03/18/20 - s/p DSEK OD 06/09/20 with Dr. Elester - Patient only taking Cellcept  1500 mg daily - increased to 1500 mg BID on 10/21/20 - Patient OFF  Cellcept  1500 mg BID - Patient is quiet with no signs of active inflammation on ocular exam today 06/25/23 - 07/12/23: acute visit for pain OD and unexplained vision loss OS. IVFA is non revealing. There are no acute findings on her exam OD. Her vision loss OD may be related to corneal graft decompensation and resultant edema. I discussed predforte/atropine  OD for comfort. She and her son agree. They agree to watchful waiting and reassessment of the left eye when they return as scheduled on 07/23/23. - Start atropine  daily OD - Start FML TID OD (pt and son prefer low potency given purported BP and BG fluctuation even with topical steroids) - Continue folic acid  1mg  daily - Continue Valtrex 1 g daily per Dr. Elester  - Follow up on 07/23/23 for reevaluation, IVE HD OS                          2. Retinal Edema - Of note her blood sugar elevated following intraocular steroid injection (Ozurdex ) - However in records from Drs. Kurup and John she did tolerate IVK x2 without issue. - See above   3. Pseudophakia Both Eyes -This is an excellent result by her excellent surgeon, Dr. Nilsa - Monitor    4. Uveitic Glaucoma Right Eye - Pt is under Dr. Cristina wonderful care.  - Continue Cosopt  PF BID OU and rocklatan qhs OU per Dr. Nilsa   5. Primary Open Angle Glaucoma Left eye - Pt is under Dr. Cristina wonderful care - Continue Cosopt  PF BID OU and Zioptan qhs OU per Dr. Nilsa - s/p shunt revision OS on 12/06/21 - s/p bleb revision 02/22/22 with Dr. Nilsa with low pressures and using BCL   6. High risk medication use - I have assessed no occult side effects to immune suppression  - High risk labs drawn on 02/27/22   7. Panuveitis Right Eye - As above   8,9. Exudative age-related macular degeneration of left eye with active choroidal neovascularization; Exudative age-related macular degeneration of right eye with inactive choroidal neovascularization - I have reviewed the nature and mechanism and discussed  the implications to her vision. I have reviewed the various treatment regiments along with their risk and benefits with intravitreal anti VEGF agents. Risks of endophthalmitis and vascular occlusive events and atrophic changes discussed with patient.  I have stressed continued AREDS vitamins and monitoring of her Amsler Grid.  - mOCT on 07/19/20 shows retinal edema and SRF increased vs prior  - I have discussed that her clinical picture has changed since previous visit and her left eye has converted to exudative macular degeneration which requires treatment of intravitreal anti-vegf injections. Her right eye remains stable. I have discussed r/b and patient consents to proceed (08/23/20) - Fill out Eylea  paperwork  - s/p IVA OS 08/23/20 - Of note, patient returned back to clinic on 02/03/21 following IVE OS for ocular pain and blurred vision. IOP is stable and there are no signs of infection or inflammation. I recommend  ATs q2hours while awake and will prescribe topical ointment for comfort. Patient expressed understanding (02/03/21) - mOCT on 04/04/21 shows PED but no SRF/IRF OS. Extend interval to 12 weeks - Of note, patient is s/p bleb revision OS 02/22/22 with low pressures and has BCL in place; Dr. Nilsa advised no injection  - Given retinal edema, I recommend escalating therapy and will seek approval for Eylea  HD - s/p IVE OS #12 11/09/22  - s/p IVE HD OS #2 05/17/23 - Recommend ATs q2hours while awake OS     Return for as scheduled with Dr. Maree on 07/23/23.   There are no Patient Instructions on file for this visit.     ==========================     [place] A. Ueberroth, MD Vitreoretinal Service Ophthalmology [facility]    CURRENT DROPS: See med list  Referring physician: No referring provider defined for this encounter.  REVIEW OF SYSTEMS: See tech note  MEDICATIONS Current Outpatient Medications  Medication Sig Dispense Refill  . apixaban  (Eliquis ) 5 mg tab  Take 5 mg by mouth 2 (two) times a day.    . atorvastatin  (LIPITOR) 40 mg tablet Take 40 mg by mouth daily.    . cholecalciferol (VITAMIN D3) 1,000 unit (25 mcg) tablet Take 1,000 Units by mouth daily.    . cyanocobalamin (VITAMIN B12) 1,000 mcg tablet Take 1,000 mcg by mouth.    . dilTIAZem  (CARDIZEM  CD) 240 mg 24 hr capsule Take 240 mg by mouth daily.    . dorzolamide -timolol , PF, 2-0.5 % drop Administer 1 drop into each eyes 2 (two) times a day. Both eyes    . erythromycin (ROMYCIN) 5 mg/gram (0.5 %) ophthalmic ointment Apply 0.5 inches to right eye at bedtime for 10 days. 3.5 g 0  . folic acid  (FOLVITE ) 1 mg tablet Take 1 tablet (1,000 mcg total) by mouth daily. 90 tablet 0  . insulin  lispro (HumaLOG ) 100 unit/mL injection Inject into the skin via insulin  pump as directed (Discard after 28 days)    . insulin  pmp cart,aut,G6/7,cntr (OMNIPOD 5 G6-G7 INTRO KT,GEN5, SUBQ) Place 1 Application on the skin every third day.    . levothyroxine  sodium 88 mcg cap Take 1 capsule by mouth daily.    . lisinopriL  (PRINIVIL ) 20 mg tablet Take 10 mg by mouth daily.    . peg 400-propylene glycol (Systane Ultra) 0.4-0.3 % drop ophthalmic solution Administer 1 drop into each eyes once as needed.    . valACYclovir (VALTREX) 1 gram tablet Take one tablet (1,000 mg total) by mouth daily. 90 tablet 3  . Xiidra 5 % dpet ophthalmic solution INSTILL 1 DROP IN BOTH EYES TWICE A DAY 180 each 3  . atropine  1 % ophthalmic solution Administer 1 drop into the right eye 3 (three) times a day. (Patient not taking: Reported on 08/07/2023) 5 mL 1  . atropine  1 % ophthalmic solution Administer 1 drop into the right eye 2 (two) times a day. 10 mL 0  . brimonidine (ALPHAGAN) 0.2 % ophthalmic solution Administer 1 drop into both eyes 3 (three) times a day. (Patient not taking: Reported on 08/07/2023) 10 mL 0  . carboxymethylcellulose-glycerin (Refresh Relieva) 0.5-0.9 % drop ophthalmic solution Administer 1 drop into each eyes once as  needed. (Patient not taking: Reported on 08/07/2023)    . fluorometholone (FML LIQUIFILM) 0.1 % drps ophthalmic suspension Administer 1 drop into the right eye 4 (four) times a day. (Patient not taking: Reported on 08/07/2023) 5 mL 3  . fluorometholone  0.25 % drps Administer 1 drop into affected eye(s) 4 (four) times a day. 10 mL 0  . pantoprazole  (PROTONIX ) 40 mg EC tablet Take 1 tablet (40 mg total) by mouth 2 (two) times a day for 21 days. (Patient not taking: Reported on 08/07/2023) 42 tablet 0   No current facility-administered medications for this visit.   ALLERGIES Allergies  Allergen Reactions  . Lincomycin Rash  . Sulfa (Sulfonamide Antibiotics) Rash  . Brimonidine Tartrate Other (See Comments)    Burning and redness  . Dexamethasone  Other (See Comments)    Blood sugar elevated   . Valacyclovir Hcl Rash    Caused rash in 2017   PAST MEDICAL HISTORY Past Surgical History:  Procedure Laterality Date  . BLEPHAROPLASTY Bilateral    Procedure: BLEPHAROPLASTY LOWER LID  . BREAST LUMPECTOMY Left    Procedure: BREAST LUMPECTOMY  . CARDIOVERSION HISTORICAL  02/12/2019   Procedure: EP CARDIOVERSION; Dr. Mona at [facility]  . CATARACT EXTRACTION W/  INTRAOCULAR LENS IMPLANT Bilateral    Procedure: CATARACT EXTRACTION W/  INTRAOCULAR LENS IMPLANT  . DESCEMETS STRIPPING AUTOMATED ENDOTHELIAL KERATOPLASTY Right 06/09/2020   Procedure: DESCEMETS STRIPPING ENDOTHELIAL KERATOPLASTY;  Surgeon: Camie Richerd Companion, MD;  Location: [facility] MAIN OR;  Service: Ophthalmology;  Laterality: Right;  . EYE SURGERY Bilateral    Procedure: OCULAR TUBE SHUNT INSERTION  . HAND SURGERY Right    Procedure: HAND SURGERY  . PARS PLANA VITRECTOMY Right 02/19/2013   Procedure: PARS PLANA VITRECTOMY  23GA;  Surgeon: Ismael Von Boll, MD;  Location: [facility] OUTPATIENT OR;  Service: Ophthalmology;  Laterality: Right;   FAMILY HISTORY Family History  Problem Relation Name Age of Onset  . Hypertension Mother     . Cancer Maternal Aunt    . Glaucoma Neg Hx    . Blindness Neg Hx    . Amblyopia Neg Hx    . Macular degeneration Neg Hx    . Retinal detachment Neg Hx    . Strabismus Neg Hx    . Diabetes Neg Hx    . Thyroid  disease Neg Hx     SOCIAL HISTORY Social History   Socioeconomic History  . Marital status: Married    Spouse name: Not on file  . Number of children: Not on file  . Years of education: Not on file  . Highest education level: Not on file  Occupational History  . Not on file  Tobacco Use  . Smoking status: Former    Current packs/day: 0.00    Types: Cigarettes    Quit date: 02/13/2017    Years since quitting: 6.4  . Smokeless tobacco: Never  Substance and Sexual Activity  . Alcohol use: Not Currently    Alcohol/week: 30.0 standard drinks of alcohol  . Drug use: No  . Sexual activity: Not on file  Other Topics Concern  . 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 [place]   Food vital sign   . Within the past 12 months, you worried that your food would run out before you got money to buy more: Never true   . Within the past 12 months, the food you bought just didn't last and you didn't have money to get more: Never true  Transportation Needs: No Transportation Needs (05/10/2023)   Received from [facility]  PRAPARE - Transportation   . Lack of Transportation (Medical): No   . Lack of Transportation (Non-Medical): No  Safety: Not At Risk (05/10/2023)   Received from [facility]   Safety   . Within the last year, have you been afraid of your partner or ex-partner?: No   . Within the last year, have you been humiliated or emotionally abused in other ways by your partner or ex-partner?: No   . Within the last year, have you been kicked, hit, slapped, or otherwise physically hurt by your partner or  ex-partner?: No   . Within the last year, have you been raped or forced to have any kind of sexual activity by your partner or ex-partner?: No  Living Situation: Unknown (07/11/2023)   Received from [facility]   Living Situation   . Unable to Pay for Housing in the Last Year: Not on file   . Number of Times Moved in the Last Year: Not on file   . At any time in the past 12 months, were you homeless or living in a shelter (including now)?: No       OPHTHALMIC EXAM: Base Eye Exam     Visual Acuity (Snellen - Linear)       Right Left   Dist cc NLP 20/60         Tonometry (Tonopen, 10:01 AM)       Right Left   Pressure 13 14         Pupils       Shape React   Right Irregular Minimal   Left Round Minimal         Neuro/Psych     Oriented x3: Yes   Mood/Affect: Normal         Dilation     Both eyes: 2.5% Phenylephrine, 1.0% Tropicamide @ 10:01 AM           Slit Lamp and Fundus Exam     External Exam       Right Left   External Normal Normal         Slit Lamp Exam       Right Left   Lids/Lashes mgd, scurf mgd, scurf   Conjunctiva/Sclera tube shunt superotemporal well covered Graft superiorly   Cornea central DSEK with haze, d folds, KP K edema, hazy   Anterior Chamber deep, hazy view due to edema No cell. No hypopyon.   Iris irregular peaking toward tube; no TIDs irregular pupil, minimally reactive   Lens PCIOL; elschnig pearls inf PCIOL   Anterior Vitreous Normal, no cells no vitritis         Fundus Exam       Right Left   Disc Cupping, PPA, limited view due to corneal edema cupping   C/D Ratio  0.9   Macula No view choroidal neovascular membrane, no active CNV, subretinal fibrosis   Vessels  Normal   Periphery  Attached 360            [facility] Outpatient Visit on 07/24/2023  Component Date Value  . Glucose, POC 07/24/2023 165 (H)   . Case Report 07/24/2023                     Value:Surgical Pathology  Report                         Case: [id number]  Authorizing Provider:  Teena DELENA Mcgregor, MD        Collected:           07/24/2023 0859             Ordering Location:     [facility]  Received:            07/24/2023 1227                                    [facility] - ENDOSCOPY                                                         Pathologist:           Wilkie Cristie Level,                                                                          MD                                                                          Specimen:    Esophagus, jar 1:  Esophageal Biopsies, evaluate for squamous dysplasia                 . Final Diagnosis 07/24/2023                     Value:A.  ESOPHAGUS, BIOPSY:  Squamous mucosa with severe dysplasia/carcinoma in situ.   See comment.      . Comment 07/24/2023                     Value:The patient's history of esophageal squamous cell carcinoma in noted. Sections demonstrate a thickened mucosal epithelium with areas of severe and full thickness cytologic atypia encompassing increased nuclear size, irregular nuclear contours, and nuclear hyperchromasia. Overall, these changes are consistent with squamous mucosa with severe dysplasia/carcinoma in situ. Correlation with clinical and endoscopic findings is advised.   A special stain for PAS is negative for fungal elements.   All controls worked appropriately.     SABRA Schultze Description 07/24/2023                     Value:A. Received labeled with the patient's name, medical record number, and jar 1: Esophageal biopsies are multiple tan tissue fragments, 0.5 x 0.5 x 0.2 cm in aggregate, filtered and entirely submitted in A1.  Gross completed by [facility] 07/24/2023 1:13 PM.  All specimens are formalin-processed and paraffin-embedded unless otherwise noted.    . Clinical Information 07/24/2023                     Value:Diagnosis:  Z98.890 - S/P endoscopy  [ICD-10-CM]        . Point of Service 07/24/2023                     Value:[facility] [place], CLIA# [id number], [facility], [place] [address]      IMAGING   Imaging  Testing report: Optical coherence tomography Date obtained -  Indication for imaging: Focal chorioretinitis OD   Technically adequate study. Patient compliance high.    Right Eye:  Central foveal thickness: 283 Findings: mild ERM, no IRF/SRF, vitelliform lesion, PED, disciform scar, outer retinal tubulation, subfoveal fibrosis, temporal retinal edema  Comparison to previous: stable   Left Eye:  Central foveal thickness: 294 Findings: NFD, vitelliform lesion, decreased temporal retinal edema, PED, no SRF/IRF Comparison to previous: stable   Diagnosis / Impression: ERM OD  Clinical management:  See below   Abbreviations: NFP--Normal foveal profile. Normal OCT. CME--cystoid macular edema. PED--pigment epithelial detachment. SRF--Subretinal fluid.  EZ --ellipsoid zone. ERM Epiretinal membrane.. ORT - outer retinal tubulation. SRHM - subretinal hyper-reflective material     ASSESSMENT/PLAN:   No diagnosis found.   1. Peripheral focal chorioretinal inflammation - I agree with Dr. Nilsa excellent exam. She has active panuveitis/focal chorioretinitis - FA 01/05/17 demonstrates papillitis and petaloid leakage of the macula OD - Initial labwork 01/05/17 - show elevated Anti-MPO with negative C and P ANCA, otherwise unremarkable. HLA-B7 and HLA-B60 positive. HLA-A29, HLA-B27, and HLA-B51 negative - I recommend Methotrexate . R/B discussed.  - Humira paperwork filled out 04/12/17 - Consider Retisert 3 year steroid implant, discussed briefly, 06/04/17  - Increased Cyclosporine  50 mg BID to 100 mg BID 10/18/17  - D/c Humira 40mg  injections due to diarrhea  - Consider IVK while ramping up IMT, though she does now have an insulin  pump - May proceed with Retisert if retinal edema does  not improve   - Increased Methotrexate  20 mg weekly to 25 mg weekly  - I have reviewed that systemic immune suppression requires monitoring with high risk labs as there is always the potential for changes in hepatic and hematologic or renal parameters that can be life threatening.  - Patient recently discharged for pneumonia and finished antibiotics (05/01/19) - Decreased 25 mg MTX weekly to 15 mg weekly on 05/01/19 - Decreased Cyclosporine  100 mg BID to 50 mg BID on 05/01/19  - Re-discussed Retisert on 07/17/19 - Increased 15 mg MTX weekly to 25 mg weekly on 07/17/19  - Decreased 25 mg MTX weekly to 15 mg weekly due to elevated liver enzymes on 09/11/19 - Of note patient is on Eloquis  - D/c 15 mg MTX weekly to 10 mg weekly due to slight elevation of liver enzymes on 11/20/19 - D/c 50 mg Cyclosporine  BID due to being on Eloquis on 11/20/19 - I recommend starting Cellcept . I have discussed side effects and patient consents to proceed on 11/20/19 - In addition to increasing Cellcept , I have also discussed she may qualify for clinical trial. I discussed nature of this particular trial and that it would involve local injection into the eye. I have discussed r/b and need for screening procedure to determine if she is eligible. Patient is interested in clinical trial (01/15/20) - Increased Cellcept  500 mg BID (started on 11/20/19) to 1000 mg BID on 01/15/20 - BCL OD removed and replaced on 02/16/20 - I will contemplate adding low-dose Rapamune at next visit (02/16/20) - Corneal edema OD noted on exam on 02/16/20.  I will refer to Dr. Elester for cornea evaluation (02/16/20) - planning for DSAEK OD with Dr. Elester on 06/09/20 - Increased Cellcept  1000 mg BID to 1500 mg BID on 03/18/20 - s/p DSEK OD 06/09/20 with Dr. Elester - Patient only taking Cellcept  1500 mg daily - increased to 1500 mg BID on 10/21/20 - Patient OFF Cellcept  1500 mg BID - Continue folic acid  1mg  daily - Continue Valtrex 1 g daily per Dr. Elester  - Recommend  to restart Atropine  daily OD and FML QID both eyes with 1 drop taper. I also re-iterated using artificial tears  - Given history of glaucoma plan for IOP in 2-3 weeks.                           2. Retinal Edema - Of note her blood sugar elevated following intraocular steroid injection (Ozurdex ) - However in records from Drs. Kurup and John she did tolerate IVK x2 without issue. - See above   3. Pseudophakia Both Eyes -This is an excellent result by her excellent surgeon, Dr. Nilsa - Monitor    4. Uveitic Glaucoma Right Eye - Pt is under Dr. Cristina wonderful care.  - Continue Cosopt  PF BID OU and rocklatan qhs OU per Dr. Nilsa   5. Primary Open Angle Glaucoma Left eye - Pt is under Dr. Cristina wonderful care - Continue Cosopt  PF BID OU and Zioptan qhs OU per Dr. Nilsa - s/p shunt revision OS on 12/06/21 - s/p bleb revision 02/22/22 with Dr. Nilsa with low pressures and using BCL   6. High risk medication use - I have assessed no occult side effects to immune suppression  - High risk labs drawn on 02/27/22   7. Panuveitis Right Eye - As above   8,9. Exudative age-related macular degeneration of left eye with active choroidal neovascularization; Exudative age-related macular degeneration of right eye with inactive choroidal neovascularization - I have reviewed the nature and mechanism and discussed the implications to her vision. I have reviewed the various treatment regiments along with their risk and benefits with intravitreal anti VEGF agents. Risks of endophthalmitis and vascular occlusive events and atrophic changes discussed with patient.  I have stressed continued AREDS vitamins and monitoring of her Amsler Grid.  - mOCT on 07/19/20 shows retinal edema and SRF increased vs prior  - I have discussed that her clinical picture has changed since previous visit and her left eye has converted to exudative macular degeneration which requires treatment of intravitreal anti-vegf injections. Her  right eye remains stable. I have discussed r/b and patient consents to proceed (08/23/20) - Fill out Eylea  paperwork  - s/p IVA OS 08/23/20 - Of note, patient returned back to clinic on 02/03/21 following IVE OS for ocular pain and blurred vision. IOP is stable and there are no signs of infection or inflammation. I recommend ATs q2hours while awake and will prescribe topical ointment for comfort. Patient expressed understanding (02/03/21) - mOCT on 04/04/21 shows PED but no SRF/IRF OS. Extend interval to 12 weeks - Of note, patient is s/p bleb revision OS 02/22/22 with low pressures and has BCL in place; Dr. Nilsa advised no injection  - Given retinal edema, I recommend escalating therapy and will seek approval for Eylea  HD - s/p IVE OS #12 11/09/22  - s/p IVE HD OS #2 05/17/23 - mOCT on 07/23/23 shows decreased temporal retinal edema OS - s/p IVE HD OS #3 07/23/23 - Follow up  as scheduled for injection.   Follow up 2-3 weeks for IOP check and AC check  Akshay R. Mentreddy, DO Fellow  Vitreoretinal Diseases and Surgery  Department of Ophthalmology [facility]

## 2023-08-12 ENCOUNTER — Encounter: Payer: Self-pay | Admitting: Cardiology

## 2023-08-13 NOTE — Telephone Encounter (Signed)
 Spoke with patient son, (DPR on file, verbal ok to speak with son also). Would like to discuss AF management and her care plan moving forward, recent seen at [facility]. Dr Inocencio didn't have an opening for several week but family wished to be seen sooner rather than later, scheduled with AF clinic Tuesday, 08/14/23 with Fairy Heinrich. No further needs today

## 2023-08-14 ENCOUNTER — Ambulatory Visit (HOSPITAL_COMMUNITY)
Admission: RE | Admit: 2023-08-14 | Discharge: 2023-08-14 | Disposition: A | Discharge status: Home / Self Care | Source: Ambulatory Visit | Attending: Internal Medicine | Admitting: Internal Medicine

## 2023-08-14 VITALS — BP 112/60 | HR 80 | Ht 65.0 in | Wt 135.8 lb

## 2023-08-14 DIAGNOSIS — I4819 Other persistent atrial fibrillation: Secondary | ICD-10-CM | POA: Insufficient documentation

## 2023-08-14 DIAGNOSIS — I4891 Unspecified atrial fibrillation: Secondary | ICD-10-CM | POA: Diagnosis not present

## 2023-08-14 DIAGNOSIS — D6869 Other thrombophilia: Secondary | ICD-10-CM | POA: Diagnosis not present

## 2023-08-14 NOTE — Progress Notes (Signed)
 Primary Care Physician: Carmen Bring, DO Primary Cardiologist: None Primary Electrophysiologist: Dr. Inocencio Referring Physician: Dr. Inocencio Dragon Cooper is a 83 y.o. female with a history of aortic atherosclerosis, diabetes, CKD, history of TIA, HTN, history of systolic LV dysfunction now improved, hypothyroidism, and atrial fibrillation who presents for consultation in the [facility]. Seen by PCP on 5/9 for hospital f/u due to hypoglycemia and was noted to be in rate controlled Afib. He checked TSH and it was 121.91. Patient is on Eliquis  for a CHADS2VASC score of 8.  On follow up today, she is in rate controlled Afib. She is here with her daughter today. She feels very tired when in Afib. She was in Afib during outside hospitalization at end of April but daughter states they were more focused on controlling her extremely high glucose levels. She was seen by PCP on 5/9 and noted to be in Afib; subsequent lab draw showed TSH abnormal at 121.91. Daughter and patient did not yet know this lab work result. Daughter acknowledges that prior to hospitalization she has had overall infrequent episodes of Afib.   She is compliant with anticoagulation and has not missed any doses. She has no bleeding concerns.  On follow up 07/17/22, she is in Afib with RVR. She feels tired and fatigued while in Afib. She will complete 3 weeks of anticoagulation on Eliquis  5 mg BID today. No missed doses. She would like DCCV as soon as possible to feel better.    On follow up 08/01/22, she is s/p DCCV on 07/20/22 with successful conversion to NSR. She feels unwell since cardioversion. She feels intermittently rapid beats that are bothersome. She feels tired. She has not checked her HR at home.   On follow up 08/14/23, she is currently in Afib. Patient's son called clinic on 6/29 noting she has had increased burden of Afib. Patient unfortunately has ongoing peripheral focal chorioretinal  inflammation and notes she can barely see out of right eye; no longer is driving. 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 No missed doses of Eliquis  5 mg BID.   Today, she denies symptoms of palpitations, chest pain, shortness of breath, orthopnea, PND, lower extremity edema, dizziness, presyncope, syncope, snoring, daytime somnolence, bleeding, or neurologic sequela. The patient is tolerating medications without difficulties and is otherwise without complaint today.    she has a BMI of Body mass index is 22.6 kg/m.Carmen Cooper Filed Weights   08/14/23 1545  Weight: 61.6 kg     Family History  Problem Relation Age of Onset   Heart disease Son    Hypertension Mother    Cancer Mother        unsure of origin   Heart attack Father    Diabetes Neg Hx     Atrial Fibrillation Management history:  Previous antiarrhythmic drugs: None Previous cardioversions: 07/20/22 Previous ablations: none Anticoagulation history: Eliquis  5 mg BID   Past Medical History:  Diagnosis Date   Atrial fibrillation (HCC)    BCC (basal cell carcinoma of skin)    Diabetes (HCC)    Glaucoma    History of TIA (transient ischemic attack) 08/11/2013   12/2012 - Dr. Maryanne    Hypertension    Hypothyroidism 08/11/2013   Memory changes    Microscopic colitis 08/21/2013   2008 - [facility] Dr. [name]  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 Nilsa, [facility]    Past Surgical History:  Procedure Laterality Date   BREAST EXCISIONAL BIOPSY Left    BREAST EXCISIONAL BIOPSY Left    CARDIOVERSION N/A 02/12/2019   Procedure: CARDIOVERSION;  Surgeon: Mona Vinie BROCKS, MD;  Location: [facility] ENDOSCOPY;  Service: Cardiovascular;  Laterality: N/A;   CARDIOVERSION N/A 07/20/2022   Procedure: CARDIOVERSION;  Surgeon: Jeffrie Oneil BROCKS, MD;  Location: [facility] INVASIVE CV LAB;  Service: Cardiovascular;   Laterality: N/A;   MOHS SURGERY  2019   Nose bcc    OTHER SURGICAL HISTORY  04/01/2019   biopsy on nose and lip     Current Outpatient Medications  Medication Sig Dispense Refill   acetaminophen  (TYLENOL ) 650 MG CR tablet Take 1 tablet (650 mg total) by mouth every 8 (eight) hours as needed for pain. 90 tablet 3   apixaban  (ELIQUIS ) 5 MG TABS tablet TAKE 1 TABLET TWICE A DAY 180 tablet 0   atorvastatin  (LIPITOR) 40 MG tablet Take 1 tablet (40 mg total) by mouth daily. 90 tablet 3   atropine  1 % ophthalmic solution Apply 1 drop to eye 2 (two) times daily.     brimonidine (ALPHAGAN) 0.2 % ophthalmic solution Apply 1 drop to eye 3 (three) times daily.     COSOPT  PF 2-0.5 % SOLN ophthalmic solution Place 1 drop into both eyes in the morning and at bedtime.     diltiazem  (CARDIZEM  CD) 240 MG 24 hr capsule Take 1 capsule (240 mg total) by mouth daily. 90 capsule 3   erythromycin ophthalmic ointment Place 1 Application into both eyes 2 (two) times daily.     folic acid  (FOLVITE ) 1 MG tablet Take 1 mg by mouth daily.      FREESTYLE LITE test strip      Insulin  Disposable Pump (OMNIPOD DASH 5 PACK PODS) MISC Inject into the skin as directed.     Insulin  Human (INSULIN  PUMP) SOLN Inject into the skin as directed. insulin  lispro (HUMALOG ) 100 UNIT/ML     insulin  lispro (HUMALOG ) 100 UNIT/ML injection Medtronic 630G pump.  Basal 12-6a 0.625, 6a-7p 0.725, 7p-12a 0.625.  Preset bolus:  4/5/6.  ISF 50.  Total daily dose:  40 units/day     Lifitegrast (XIIDRA) 5 % SOLN Place 1 drop into both eyes daily.      lisinopril  (ZESTRIL ) 20 MG tablet TAKE ONE-HALF (1/2) TABLET DAILY 45 tablet 3   metoprolol  succinate (TOPROL -XL) 50 MG 24 hr tablet Take 50 mg by mouth daily.     mycophenolate  (CELLCEPT ) 500 MG tablet Take 500 mg by mouth 2 (two) times daily.     neomycin-polymyxin b-dexamethasone  (MAXITROL) 3.5-10000-0.1 OINT Place into the left eye at bedtime.     pantoprazole  (PROTONIX ) 40 MG tablet Take 40 mg  by mouth 2 (two) times daily.     RABEprazole (ACIPHEX) 20 MG tablet Take 20 mg by mouth every morning.     SURE COMFORT PEN NEEDLES 31G X 8 MM MISC  (Patient taking differently: as needed.)     timolol  (BETIMOL ) 0.5 % ophthalmic solution Place 1 drop into both eyes 2 (two) times daily.     TIROSINT  88 MCG CAPS Take 88 mcg by mouth 2 (two) times a week. 4 gel capsules on Wednesday and Saturday on empty stomach     valACYclovir (VALTREX) 1000 MG tablet Take 1,000 mg by mouth daily.     ZIOPTAN 0.0015 % SOLN Place 1 drop into both eyes at  bedtime.      AMBULATORY NON FORMULARY MEDICATION Freestyle light test strips Test twice a day  Dx type 2 diabetes E11.9 (Patient not taking: Reported on 08/14/2023) 100 each 11   BAQSIMI TWO PACK 3 MG/DOSE POWD Place 1 puff into both nostrils as needed (hypoglycemia). (Patient not taking: Reported on 08/14/2023)     Multiple Vitamins-Minerals (CENTRUM SILVER 50+WOMEN PO) Take 1 tablet by mouth daily.  (Patient not taking: Reported on 08/14/2023)     Omega-3 1000 MG CAPS Take 1,000 mg by mouth daily. (Patient not taking: Reported on 08/14/2023)     No current facility-administered medications for this encounter.    ROS- All systems are reviewed and negative except as per the HPI above.  Physical Exam: Vitals:   08/14/23 1545  BP: 112/60  Pulse: 80  Weight: 61.6 kg  Height: 5' 5 (1.651 m)    GEN- The patient is well appearing, alert and oriented x 3 today.   Neck - no JVD or carotid bruit noted Lungs- Clear to ausculation bilaterally, normal work of breathing Heart- Irregular rate and rhythm, no murmurs, rubs or gallops, PMI not laterally displaced Extremities- no clubbing, cyanosis, or edema Skin - no rash or ecchymosis noted   Wt Readings from Last 3 Encounters:  08/14/23 61.6 kg  05/21/23 63.5 kg  03/21/23 58.1 kg    EKG today demonstrates  Vent. rate 80 BPM PR interval * ms QRS duration 72 ms QT/QTcB 402/463 ms P-R-T axes * 77 80 Atrial  fibrillation Abnormal ECG When compared with ECG of 01-Aug-2022 09:07, Atrial fibrillation has replaced Sinus rhythm  Echo 04/20/22 demonstrated: 1. Left ventricular ejection fraction, by estimation, is 60 to 65%. The  left ventricle has normal function. The left ventricle has no regional  wall motion abnormalities. There is mild left ventricular hypertrophy.  Left ventricular diastolic parameters  are consistent with Grade II diastolic dysfunction (pseudonormalization).   2. Right ventricular systolic function is normal. The right ventricular  size is normal.   3. Left atrial size was severely dilated.   4. Right atrial size was mild to moderately dilated.   5. The mitral valve is normal in structure. Mild mitral valve  regurgitation. No evidence of mitral stenosis.   6. The aortic valve is normal in structure. Aortic valve regurgitation is  not visualized. No aortic stenosis is present.   7. There is mild dilatation Upper abdominal, measuring 39 mm.   8. The inferior vena cava is normal in size with greater than 50%  respiratory variability, suggesting right atrial pressure of 3 mmHg.   Comparison(s): EF 40%, mild LVH, RA & LA severely dialted, moderate TR,  mild-mod MR RVSP 38.9 mmHg.   Epic records are reviewed at length today.  CHA2DS2-VASc Score = 8  The patient's score is based upon: CHF History: 0 HTN History: 1 Diabetes History: 1 Stroke History: 2 Vascular Disease History: 1 Age Score: 2 Gender Score: 1       ASSESSMENT AND PLAN: Persistent Atrial Fibrillation (ICD10:  I48.19) The patient's CHA2DS2-VASc score is 8, indicating a 10.8% annual risk of stroke.    She is currently in Afib. We discussed the procedure cardioversion to try to convert to NSR. We discussed the risks vs benefits of this procedure and how ultimately we cannot predict whether a patient will have early return of arrhythmia post procedure. After discussion, the patient wishes to proceed with  cardioversion. Labs drawn today. We did briefly discuss Multaq, Tikosyn 250 mcg  BID, and amiodarone  and ablation going forward if patient has ERAF. I would likely avoid amiodarone  due to ongoing ophthalmic condition in favor of other AAD / ablation if EP believes is a candidate for procedure.   Informed Consent   Shared Decision Making/Informed Consent The risks (stroke, cardiac arrhythmias rarely resulting in the need for a temporary or permanent pacemaker, skin irritation or burns and complications associated with conscious sedation including aspiration, arrhythmia, respiratory failure and death), benefits (restoration of normal sinus rhythm) and alternatives of a direct current cardioversion were explained in detail to Ms. Pasquarella and she agrees to proceed.       PR 172 ms Qtc in NSR 432 ms CrCl 47 mL/min from [facility] on 05/09/23  2. Secondary Hypercoagulable State (ICD10:  D68.69) The patient is at significant risk for stroke/thromboembolism based upon her CHA2DS2-VASc Score of 8.  Continue Apixaban  (Eliquis ).  Continue Eliquis  5 mg BID. No missed doses.   3. HTN Stable today.   Follow up 2 weeks after DCCV.   Fairy Heinrich, PA-C Afib Clinic [facility] [address] [place], [place] [address] [phone] 08/14/2023 4:33 PM

## 2023-08-14 NOTE — H&P (View-Only) (Signed)
 Primary Care Physician: Carmen Bring, DO Primary Cardiologist: None Primary Electrophysiologist: Dr. Inocencio Referring Physician: Dr. Inocencio Carmen Cooper is a 83 y.o. female with a history of aortic atherosclerosis, diabetes, CKD, history of TIA, HTN, history of systolic LV dysfunction now improved, hypothyroidism, and atrial fibrillation who presents for consultation in the [facility]. Seen by PCP on 5/9 for hospital f/u due to hypoglycemia and was noted to be in rate controlled Afib. He checked TSH and it was 121.91. Patient is on Eliquis  for a CHADS2VASC score of 8.  On follow up today, she is in rate controlled Afib. She is here with her daughter today. She feels very tired when in Afib. She was in Afib during outside hospitalization at end of April but daughter states they were more focused on controlling her extremely high glucose levels. She was seen by PCP on 5/9 and noted to be in Afib; subsequent lab draw showed TSH abnormal at 121.91. Daughter and patient did not yet know this lab work result. Daughter acknowledges that prior to hospitalization she has had overall infrequent episodes of Afib.   She is compliant with anticoagulation and has not missed any doses. She has no bleeding concerns.  On follow up 07/17/22, she is in Afib with RVR. She feels tired and fatigued while in Afib. She will complete 3 weeks of anticoagulation on Eliquis  5 mg BID today. No missed doses. She would like DCCV as soon as possible to feel better.    On follow up 08/01/22, she is s/p DCCV on 07/20/22 with successful conversion to NSR. She feels unwell since cardioversion. She feels intermittently rapid beats that are bothersome. She feels tired. She has not checked her HR at home.   On follow up 08/14/23, she is currently in Afib. Patient's son called clinic on 6/29 noting she has had increased burden of Afib. Patient unfortunately has ongoing peripheral focal chorioretinal  inflammation and notes she can barely see out of right eye; no longer is driving. 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 No missed doses of Eliquis  5 mg BID.   Today, she denies symptoms of palpitations, chest pain, shortness of breath, orthopnea, PND, lower extremity edema, dizziness, presyncope, syncope, snoring, daytime somnolence, bleeding, or neurologic sequela. The patient is tolerating medications without difficulties and is otherwise without complaint today.    she has a BMI of Body mass index is 22.6 kg/m.Carmen Filed Weights   08/14/23 1545  Weight: 61.6 kg     Family History  Problem Relation Age of Onset   Heart disease Son    Hypertension Mother    Cancer Mother        unsure of origin   Heart attack Father    Diabetes Neg Hx     Atrial Fibrillation Management history:  Previous antiarrhythmic drugs: None Previous cardioversions: 07/20/22 Previous ablations: none Anticoagulation history: Eliquis  5 mg BID   Past Medical History:  Diagnosis Date   Atrial fibrillation (HCC)    BCC (basal cell carcinoma of skin)    Diabetes (HCC)    Glaucoma    History of TIA (transient ischemic attack) 08/11/2013   12/2012 - Dr. Maryanne    Hypertension    Hypothyroidism 08/11/2013   Memory changes    Microscopic colitis 08/21/2013   2008 - [facility] Dr. [name]  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 Nilsa, [facility]    Past Surgical History:  Procedure Laterality Date   BREAST EXCISIONAL BIOPSY Left    BREAST EXCISIONAL BIOPSY Left    CARDIOVERSION N/A 02/12/2019   Procedure: CARDIOVERSION;  Surgeon: Mona Vinie BROCKS, MD;  Location: [facility] ENDOSCOPY;  Service: Cardiovascular;  Laterality: N/A;   CARDIOVERSION N/A 07/20/2022   Procedure: CARDIOVERSION;  Surgeon: Jeffrie Oneil BROCKS, MD;  Location: [facility] INVASIVE CV LAB;  Service: Cardiovascular;   Laterality: N/A;   MOHS SURGERY  2019   Nose bcc    OTHER SURGICAL HISTORY  04/01/2019   biopsy on nose and lip     Current Outpatient Medications  Medication Sig Dispense Refill   acetaminophen  (TYLENOL ) 650 MG CR tablet Take 1 tablet (650 mg total) by mouth every 8 (eight) hours as needed for pain. 90 tablet 3   apixaban  (ELIQUIS ) 5 MG TABS tablet TAKE 1 TABLET TWICE A DAY 180 tablet 0   atorvastatin  (LIPITOR) 40 MG tablet Take 1 tablet (40 mg total) by mouth daily. 90 tablet 3   atropine  1 % ophthalmic solution Apply 1 drop to eye 2 (two) times daily.     brimonidine (ALPHAGAN) 0.2 % ophthalmic solution Apply 1 drop to eye 3 (three) times daily.     COSOPT  PF 2-0.5 % SOLN ophthalmic solution Place 1 drop into both eyes in the morning and at bedtime.     diltiazem  (CARDIZEM  CD) 240 MG 24 hr capsule Take 1 capsule (240 mg total) by mouth daily. 90 capsule 3   erythromycin ophthalmic ointment Place 1 Application into both eyes 2 (two) times daily.     folic acid  (FOLVITE ) 1 MG tablet Take 1 mg by mouth daily.      FREESTYLE LITE test strip      Insulin  Disposable Pump (OMNIPOD DASH 5 PACK PODS) MISC Inject into the skin as directed.     Insulin  Human (INSULIN  PUMP) SOLN Inject into the skin as directed. insulin  lispro (HUMALOG ) 100 UNIT/ML     insulin  lispro (HUMALOG ) 100 UNIT/ML injection Medtronic 630G pump.  Basal 12-6a 0.625, 6a-7p 0.725, 7p-12a 0.625.  Preset bolus:  4/5/6.  ISF 50.  Total daily dose:  40 units/day     Lifitegrast (XIIDRA) 5 % SOLN Place 1 drop into both eyes daily.      lisinopril  (ZESTRIL ) 20 MG tablet TAKE ONE-HALF (1/2) TABLET DAILY 45 tablet 3   metoprolol  succinate (TOPROL -XL) 50 MG 24 hr tablet Take 50 mg by mouth daily.     mycophenolate  (CELLCEPT ) 500 MG tablet Take 500 mg by mouth 2 (two) times daily.     neomycin-polymyxin b-dexamethasone  (MAXITROL) 3.5-10000-0.1 OINT Place into the left eye at bedtime.     pantoprazole  (PROTONIX ) 40 MG tablet Take 40 mg  by mouth 2 (two) times daily.     RABEprazole (ACIPHEX) 20 MG tablet Take 20 mg by mouth every morning.     SURE COMFORT PEN NEEDLES 31G X 8 MM MISC  (Patient taking differently: as needed.)     timolol  (BETIMOL ) 0.5 % ophthalmic solution Place 1 drop into both eyes 2 (two) times daily.     TIROSINT  88 MCG CAPS Take 88 mcg by mouth 2 (two) times a week. 4 gel capsules on Wednesday and Saturday on empty stomach     valACYclovir (VALTREX) 1000 MG tablet Take 1,000 mg by mouth daily.     ZIOPTAN 0.0015 % SOLN Place 1 drop into both eyes at  bedtime.      AMBULATORY NON FORMULARY MEDICATION Freestyle light test strips Test twice a day  Dx type 2 diabetes E11.9 (Patient not taking: Reported on 08/14/2023) 100 each 11   BAQSIMI TWO PACK 3 MG/DOSE POWD Place 1 puff into both nostrils as needed (hypoglycemia). (Patient not taking: Reported on 08/14/2023)     Multiple Vitamins-Minerals (CENTRUM SILVER 50+WOMEN PO) Take 1 tablet by mouth daily.  (Patient not taking: Reported on 08/14/2023)     Omega-3 1000 MG CAPS Take 1,000 mg by mouth daily. (Patient not taking: Reported on 08/14/2023)     No current facility-administered medications for this encounter.    ROS- All systems are reviewed and negative except as per the HPI above.  Physical Exam: Vitals:   08/14/23 1545  BP: 112/60  Pulse: 80  Weight: 61.6 kg  Height: 5' 5 (1.651 m)    GEN- The patient is well appearing, alert and oriented x 3 today.   Neck - no JVD or carotid bruit noted Lungs- Clear to ausculation bilaterally, normal work of breathing Heart- Irregular rate and rhythm, no murmurs, rubs or gallops, PMI not laterally displaced Extremities- no clubbing, cyanosis, or edema Skin - no rash or ecchymosis noted   Wt Readings from Last 3 Encounters:  08/14/23 61.6 kg  05/21/23 63.5 kg  03/21/23 58.1 kg    EKG today demonstrates  Vent. rate 80 BPM PR interval * ms QRS duration 72 ms QT/QTcB 402/463 ms P-R-T axes * 77 80 Atrial  fibrillation Abnormal ECG When compared with ECG of 01-Aug-2022 09:07, Atrial fibrillation has replaced Sinus rhythm  Echo 04/20/22 demonstrated: 1. Left ventricular ejection fraction, by estimation, is 60 to 65%. The  left ventricle has normal function. The left ventricle has no regional  wall motion abnormalities. There is mild left ventricular hypertrophy.  Left ventricular diastolic parameters  are consistent with Grade II diastolic dysfunction (pseudonormalization).   2. Right ventricular systolic function is normal. The right ventricular  size is normal.   3. Left atrial size was severely dilated.   4. Right atrial size was mild to moderately dilated.   5. The mitral valve is normal in structure. Mild mitral valve  regurgitation. No evidence of mitral stenosis.   6. The aortic valve is normal in structure. Aortic valve regurgitation is  not visualized. No aortic stenosis is present.   7. There is mild dilatation Upper abdominal, measuring 39 mm.   8. The inferior vena cava is normal in size with greater than 50%  respiratory variability, suggesting right atrial pressure of 3 mmHg.   Comparison(s): EF 40%, mild LVH, RA & LA severely dialted, moderate TR,  mild-mod MR RVSP 38.9 mmHg.   Epic records are reviewed at length today.  CHA2DS2-VASc Score = 8  The patient's score is based upon: CHF History: 0 HTN History: 1 Diabetes History: 1 Stroke History: 2 Vascular Disease History: 1 Age Score: 2 Gender Score: 1       ASSESSMENT AND PLAN: Persistent Atrial Fibrillation (ICD10:  I48.19) The patient's CHA2DS2-VASc score is 8, indicating a 10.8% annual risk of stroke.    She is currently in Afib. We discussed the procedure cardioversion to try to convert to NSR. We discussed the risks vs benefits of this procedure and how ultimately we cannot predict whether a patient will have early return of arrhythmia post procedure. After discussion, the patient wishes to proceed with  cardioversion. Labs drawn today. We did briefly discuss Multaq, Tikosyn 250 mcg  BID, and amiodarone  and ablation going forward if patient has ERAF. I would likely avoid amiodarone  due to ongoing ophthalmic condition in favor of other AAD / ablation if EP believes is a candidate for procedure.   Informed Consent   Shared Decision Making/Informed Consent The risks (stroke, cardiac arrhythmias rarely resulting in the need for a temporary or permanent pacemaker, skin irritation or burns and complications associated with conscious sedation including aspiration, arrhythmia, respiratory failure and death), benefits (restoration of normal sinus rhythm) and alternatives of a direct current cardioversion were explained in detail to Carmen Cooper and she agrees to proceed.       PR 172 ms Qtc in NSR 432 ms CrCl 47 mL/min from [facility] on 05/09/23  2. Secondary Hypercoagulable State (ICD10:  D68.69) The patient is at significant risk for stroke/thromboembolism based upon her CHA2DS2-VASc Score of 8.  Continue Apixaban  (Eliquis ).  Continue Eliquis  5 mg BID. No missed doses.   3. HTN Stable today.   Follow up 2 weeks after DCCV.   Fairy Heinrich, PA-C Afib Clinic [facility] [address] [place], [place] [address] [phone] 08/14/2023 4:33 PM

## 2023-08-14 NOTE — Patient Instructions (Signed)
 Cardioversion scheduled for: Tuesday, July 8th   - Arrive at the [facility] ([address]  and check in with ADMITTING at 7:30AM   - Do not eat or drink anything after midnight the night prior to your procedure.   - Take all your morning medication (except diabetic medications) with a sip of water prior to arrival.  - Do NOT miss any doses of your blood thinner - if you should miss a dose or take a dose more than 4 hours late -- please notify our office immediately.  - You will not be able to drive home after your procedure. Please ensure you have a responsible adult to drive you home. You will need someone with you for 24 hours post procedure.     - Expect to be in the procedural area approximately 2 hours.   - If you feel as if you go back into normal rhythm prior to scheduled cardioversion, please notify our office immediately.   If your procedure is canceled in the cardioversion suite you will be charged a cancellation fee.

## 2023-08-15 ENCOUNTER — Other Ambulatory Visit: Payer: Self-pay | Admitting: Cardiology

## 2023-08-15 ENCOUNTER — Ambulatory Visit (HOSPITAL_COMMUNITY): Payer: Self-pay | Admitting: Internal Medicine

## 2023-08-15 DIAGNOSIS — I4891 Unspecified atrial fibrillation: Secondary | ICD-10-CM

## 2023-08-15 DIAGNOSIS — R0789 Other chest pain: Secondary | ICD-10-CM

## 2023-08-15 LAB — CBC
Hematocrit: 42.9 % (ref 34.0–46.6)
Hemoglobin: 13.8 g/dL (ref 11.1–15.9)
MCH: 30.6 pg (ref 26.6–33.0)
MCHC: 32.2 g/dL (ref 31.5–35.7)
MCV: 95 fL (ref 79–97)
Platelets: 281 10*3/uL (ref 150–450)
RBC: 4.51 x10E6/uL (ref 3.77–5.28)
RDW: 14.8 % (ref 11.7–15.4)
WBC: 5.8 10*3/uL (ref 3.4–10.8)

## 2023-08-15 LAB — BASIC METABOLIC PANEL WITH GFR
BUN/Creatinine Ratio: 22 (ref 12–28)
BUN: 27 mg/dL (ref 8–27)
CO2: 26 mmol/L (ref 20–29)
Calcium: 9.1 mg/dL (ref 8.7–10.3)
Chloride: 104 mmol/L (ref 96–106)
Creatinine, Ser: 1.24 mg/dL — ABNORMAL HIGH (ref 0.57–1.00)
Glucose: 165 mg/dL — ABNORMAL HIGH (ref 70–99)
Potassium: 4.9 mmol/L (ref 3.5–5.2)
Sodium: 142 mmol/L (ref 134–144)
eGFR: 43 mL/min/{1.73_m2} — ABNORMAL LOW (ref >59)

## 2023-08-15 NOTE — Telephone Encounter (Signed)
 Prescription refill request for Eliquis  received. Indication: AF Last office visit: 08/14/23  JINNY Heinrich PA-C Scr: 0.91 on 05/09/23  Epic Age: 83 Weight: 61.6kg  Based on above findings Eliquis  5mg  twice daily is the appropriate dose.  Refill approved.

## 2023-08-20 DIAGNOSIS — E1022 Type 1 diabetes mellitus with diabetic chronic kidney disease: Secondary | ICD-10-CM | POA: Diagnosis not present

## 2023-08-20 DIAGNOSIS — E1049 Type 1 diabetes mellitus with other diabetic neurological complication: Secondary | ICD-10-CM | POA: Diagnosis not present

## 2023-08-20 DIAGNOSIS — E039 Hypothyroidism, unspecified: Secondary | ICD-10-CM | POA: Diagnosis not present

## 2023-08-20 DIAGNOSIS — N183 Chronic kidney disease, stage 3 unspecified: Secondary | ICD-10-CM | POA: Diagnosis not present

## 2023-08-20 NOTE — Progress Notes (Signed)
 Pt's son returned call from previous RN's voicemail. This RN discussed pre-procedure instructions for tomorrow.   Patient informed of:   Time to arrive for procedure. Remain NPO past midnight.  Must have a ride home and a responsible adult to remain with them for 24 hours post procedure.  Confirmed blood thinner. Confirmed no breaks in taking blood thinner for 3+ weeks prior to procedure. Confirmed patient stopped all GLP-1s and GLP-2s for at least one week before procedure.

## 2023-08-20 NOTE — Anesthesia Preprocedure Evaluation (Signed)
 Anesthesia Evaluation  Patient identified by MRN, date of birth, ID band Patient awake    Reviewed: Allergy & Precautions, H&P , NPO status , Patient's Chart, lab work & pertinent test results  Airway Mallampati: III  TM Distance: >3 FB Neck ROM: Full    Dental  (+) Dental Advisory Given, Teeth Intact   Pulmonary former smoker   Pulmonary exam normal breath sounds clear to auscultation       Cardiovascular hypertension, Pt. on home beta blockers and Pt. on medications (-) CAD + dysrhythmias Atrial Fibrillation + Valvular Problems/Murmurs MR  Rhythm:Irregular Rate:Normal  Echo  04/2022  1. Left ventricular ejection fraction, by estimation, is 60 to 65%. The left ventricle has normal function. The left ventricle has no regional wall motion abnormalities. There is mild left ventricular hypertrophy. Left ventricular diastolic parameters are consistent with Grade II diastolic dysfunction (pseudonormalization).   2. Right ventricular systolic function is normal. The right ventricular size is normal.   3. Left atrial size was severely dilated.   4. Right atrial size was mild to moderately dilated.   5. The mitral valve is normal in structure. Mild mitral valve regurgitation. No evidence of mitral stenosis.   6. The aortic valve is normal in structure. Aortic valve regurgitation is not visualized. No aortic stenosis is present.   7. There is mild dilatation Upper abdominal, measuring 39 mm.   8. The inferior vena cava is normal in size with greater than 50% respiratory variability, suggesting right atrial pressure of 3 mmHg.   Comparison(s): EF 40%, mild LVH, RA & LA severely dialted, moderate TR,  mild-mod MR RVSP 38.9 mmHg.     Neuro/Psych    GI/Hepatic ,GERD  Medicated and Controlled,,  Endo/Other  diabetes, Type 2Hypothyroidism    Renal/GU Renal disease     Musculoskeletal  (+) Arthritis ,    Abdominal   Peds  Hematology    Anesthesia Other Findings   Reproductive/Obstetrics                              Anesthesia Physical Anesthesia Plan  ASA: 3  Anesthesia Plan: General   Post-op Pain Management:    Induction: Intravenous  PONV Risk Score and Plan: 3 and Propofol  infusion and Treatment may vary due to age or medical condition  Airway Management Planned: Mask  Additional Equipment:   Intra-op Plan:   Post-operative Plan:   Informed Consent: I have reviewed the patients History and Physical, chart, labs and discussed the procedure including the risks, benefits and alternatives for the proposed anesthesia with the patient or authorized representative who has indicated his/her understanding and acceptance.     Dental advisory given  Plan Discussed with: CRNA  Anesthesia Plan Comments:         Anesthesia Quick Evaluation

## 2023-08-20 NOTE — Progress Notes (Signed)
 Left message for return call with patient and her son to review instructions  for appointment on 08/21/23

## 2023-08-21 ENCOUNTER — Ambulatory Visit (HOSPITAL_COMMUNITY)
Admission: RE | Admit: 2023-08-21 | Discharge: 2023-08-21 | Disposition: A | Discharge status: Home / Self Care | Attending: Cardiology | Admitting: Cardiology

## 2023-08-21 ENCOUNTER — Ambulatory Visit (HOSPITAL_COMMUNITY): Payer: Self-pay | Admitting: Anesthesiology

## 2023-08-21 ENCOUNTER — Other Ambulatory Visit: Payer: Self-pay

## 2023-08-21 ENCOUNTER — Ambulatory Visit (HOSPITAL_BASED_OUTPATIENT_CLINIC_OR_DEPARTMENT_OTHER): Payer: Self-pay | Admitting: Anesthesiology

## 2023-08-21 ENCOUNTER — Encounter (HOSPITAL_COMMUNITY): Payer: Self-pay | Admitting: Cardiology

## 2023-08-21 ENCOUNTER — Encounter (HOSPITAL_COMMUNITY)
Admission: RE | Disposition: A | Discharge status: Home / Self Care | Payer: Self-pay | Source: Home / Self Care | Attending: Cardiology

## 2023-08-21 DIAGNOSIS — Z87891 Personal history of nicotine dependence: Secondary | ICD-10-CM | POA: Diagnosis not present

## 2023-08-21 DIAGNOSIS — N189 Chronic kidney disease, unspecified: Secondary | ICD-10-CM | POA: Insufficient documentation

## 2023-08-21 DIAGNOSIS — I4891 Unspecified atrial fibrillation: Secondary | ICD-10-CM

## 2023-08-21 DIAGNOSIS — E039 Hypothyroidism, unspecified: Secondary | ICD-10-CM

## 2023-08-21 DIAGNOSIS — I700 Atherosclerosis of aorta: Secondary | ICD-10-CM | POA: Insufficient documentation

## 2023-08-21 DIAGNOSIS — Z794 Long term (current) use of insulin: Secondary | ICD-10-CM | POA: Insufficient documentation

## 2023-08-21 DIAGNOSIS — I129 Hypertensive chronic kidney disease with stage 1 through stage 4 chronic kidney disease, or unspecified chronic kidney disease: Secondary | ICD-10-CM | POA: Diagnosis not present

## 2023-08-21 DIAGNOSIS — Z8673 Personal history of transient ischemic attack (TIA), and cerebral infarction without residual deficits: Secondary | ICD-10-CM | POA: Insufficient documentation

## 2023-08-21 DIAGNOSIS — Z7901 Long term (current) use of anticoagulants: Secondary | ICD-10-CM | POA: Diagnosis not present

## 2023-08-21 DIAGNOSIS — D6869 Other thrombophilia: Secondary | ICD-10-CM | POA: Diagnosis not present

## 2023-08-21 DIAGNOSIS — I10 Essential (primary) hypertension: Secondary | ICD-10-CM

## 2023-08-21 DIAGNOSIS — Z79899 Other long term (current) drug therapy: Secondary | ICD-10-CM | POA: Insufficient documentation

## 2023-08-21 DIAGNOSIS — N1831 Chronic kidney disease, stage 3a: Secondary | ICD-10-CM | POA: Diagnosis not present

## 2023-08-21 DIAGNOSIS — K219 Gastro-esophageal reflux disease without esophagitis: Secondary | ICD-10-CM | POA: Insufficient documentation

## 2023-08-21 DIAGNOSIS — E1122 Type 2 diabetes mellitus with diabetic chronic kidney disease: Secondary | ICD-10-CM | POA: Diagnosis not present

## 2023-08-21 DIAGNOSIS — I4819 Other persistent atrial fibrillation: Secondary | ICD-10-CM | POA: Diagnosis not present

## 2023-08-21 HISTORY — PX: CARDIOVERSION: EP1203

## 2023-08-21 LAB — LIPID PANEL
Cholesterol: 137 (ref 0–200)
HDL: 56 (ref 35–70)
LDL Cholesterol: 62
Triglycerides: 106 (ref 40–160)

## 2023-08-21 LAB — BASIC METABOLIC PANEL WITH GFR
BUN: 21 (ref 4–21)
CO2: 23 — AB (ref 13–22)
Chloride: 103 (ref 99–108)
Creatinine: 1.1 (ref 0.5–1.1)
Glucose: 164
Potassium: 3.8 meq/L (ref 3.5–5.1)
Sodium: 140 (ref 137–147)

## 2023-08-21 LAB — COMPREHENSIVE METABOLIC PANEL WITH GFR
Albumin: 3.7 (ref 3.5–5.0)
Calcium: 8.9 (ref 8.7–10.7)
Globulin: 2.5
eGFR: 52

## 2023-08-21 LAB — TSH
TSH: 13.312 u[IU]/mL — ABNORMAL HIGH (ref 0.350–4.500)
TSH: 8.18 — AB (ref 0.41–5.90)

## 2023-08-21 LAB — HEPATIC FUNCTION PANEL
ALT: 22 U/L (ref 7–35)
AST: 21 (ref 13–35)
Alkaline Phosphatase: 102 (ref 25–125)
Bilirubin, Total: 0.5

## 2023-08-21 LAB — MICROALBUMIN / CREATININE URINE RATIO: Microalb Creat Ratio: 122

## 2023-08-21 LAB — HEMOGLOBIN A1C: Hemoglobin A1C: 7.2

## 2023-08-21 SURGERY — CARDIOVERSION (CATH LAB)
Anesthesia: General

## 2023-08-21 MED ORDER — SODIUM CHLORIDE 0.9% FLUSH: 3.0000 mL | Freq: Two times a day (BID) | INTRAVENOUS | Status: DC

## 2023-08-21 MED ORDER — SODIUM CHLORIDE 0.9% FLUSH: 3.0000 mL | INTRAVENOUS | Status: DC | PRN

## 2023-08-21 MED ORDER — PROPOFOL 10 MG/ML IV BOLUS
INTRAVENOUS | Status: DC | PRN
Start: 2023-08-21 — End: 2023-08-21
  Administered 2023-08-21: 50 mg via INTRAVENOUS
  Administered 2023-08-21: 10 mg via INTRAVENOUS

## 2023-08-21 MED ORDER — LIDOCAINE 2% (20 MG/ML) 5 ML SYRINGE
INTRAMUSCULAR | Status: DC | PRN
  Administered 2023-08-21: 60 mg via INTRAVENOUS

## 2023-08-21 SURGICAL SUPPLY — 1 items: PAD DEFIB RADIO PHYSIO CONN (PAD) ×1 IMPLANT

## 2023-08-21 NOTE — Interval H&P Note (Signed)
 History and Physical Interval Note:  08/21/2023 7:49 AM  Carmen Cooper  has presented today for surgery, with the diagnosis of AFIB.  The various methods of treatment have been discussed with the patient and family. After consideration of risks, benefits and other options for treatment, the patient has consented to  Procedure(s): CARDIOVERSION (N/A) as a surgical intervention.  The patient's history has been reviewed, patient examined, no change in status, stable for surgery.  I have reviewed the patient's chart and labs.  Questions were answered to the patient's satisfaction.     [name]

## 2023-08-21 NOTE — CV Procedure (Signed)
    Electrical Cardioversion Procedure Note Carmen Cooper [id number] [birth year]  Procedure: Electrical Cardioversion Indications:  Atrial Fibrillation  Time Out: Verified patient identification, verified procedure,medications/allergies/relevent history reviewed, required imaging and test results available.  Performed  Procedure Details  During this procedure the patient is administered a total of Propofol  60 mg and Lidocaine  60 mg to achieve and maintain moderate conscious sedation.  The patient's heart rate, blood pressure, and oxygen saturation are monitored continuously during the procedure. The period of conscious sedation is 3 minutes, of which I was present face-to-face 100% of this time. Reyes Barefoot, CRNA is an independent, trained observer who assisted in the monitoring of the patient's level of consciousness.     Cardioversion was done with synchronized biphasic defibrillation with AP pads with 200watts.  The patient converted to normal sinus rhythm. The patient tolerated the procedure well   IMPRESSION:  Successful cardioversion of atrial fibrillation    Carmen Cooper 08/21/2023, 9:10 AM

## 2023-08-21 NOTE — Transfer of Care (Signed)
 Immediate Anesthesia Transfer of Care Note  Patient: Carmen Cooper  Procedure(s) Performed: CARDIOVERSION  Patient Location: PACU and Cath Lab  Anesthesia Type:General  Level of Consciousness: sedated  Airway & Oxygen Therapy: Patient Spontanous Breathing and Patient connected to nasal cannula oxygen  Post-op Assessment: Report given to RN and Post -op Vital signs reviewed and stable  Post vital signs: stable  Last Vitals:  Vitals Value Taken Time  BP 154/119 08/21/23 09:15  Temp    Pulse 77 08/21/23 09:15  Resp 16 08/21/23 09:15  SpO2 94 % 08/21/23 09:15  Vitals shown include unfiled device data.  Last Pain:  Vitals:   08/21/23 0747  TempSrc: Temporal  PainSc: 0-No pain         Complications: No notable events documented.

## 2023-08-21 NOTE — Discharge Instructions (Signed)

## 2023-08-22 NOTE — Anesthesia Postprocedure Evaluation (Signed)
 Anesthesia Post Note  Patient: Carmen Cooper  Procedure(s) Performed: CARDIOVERSION     Patient location during evaluation: Cath Lab Anesthesia Type: General Level of consciousness: patient cooperative and awake and alert Pain management: pain level controlled Vital Signs Assessment: post-procedure vital signs reviewed and stable Respiratory status: spontaneous breathing Cardiovascular status: stable Anesthetic complications: no   No notable events documented.  Last Vitals:  Vitals:   08/21/23 1000 08/21/23 1005  BP: (!) 149/63 136/79  Pulse: (!) 59 (!) 59  Resp: 18 16  Temp: 36.6 C   SpO2: 96% 96%    Last Pain:  Vitals:   08/21/23 1000  TempSrc: Temporal  PainSc: 0-No pain                 Norleen Pope

## 2023-08-23 ENCOUNTER — Encounter: Payer: Self-pay | Admitting: Cardiology

## 2023-08-27 DIAGNOSIS — H30031 Focal chorioretinal inflammation, peripheral, right eye: Secondary | ICD-10-CM | POA: Diagnosis not present

## 2023-08-29 ENCOUNTER — Ambulatory Visit (HOSPITAL_COMMUNITY)
Admission: RE | Admit: 2023-08-29 | Discharge: 2023-08-29 | Disposition: A | Discharge status: Home / Self Care | Source: Ambulatory Visit | Attending: Internal Medicine | Admitting: Internal Medicine

## 2023-08-29 VITALS — BP 150/80 | HR 65 | Ht 65.0 in | Wt 137.0 lb

## 2023-08-29 DIAGNOSIS — I493 Ventricular premature depolarization: Secondary | ICD-10-CM | POA: Diagnosis not present

## 2023-08-29 DIAGNOSIS — I4819 Other persistent atrial fibrillation: Secondary | ICD-10-CM | POA: Diagnosis present

## 2023-08-29 DIAGNOSIS — I4891 Unspecified atrial fibrillation: Secondary | ICD-10-CM | POA: Diagnosis present

## 2023-08-29 DIAGNOSIS — D6869 Other thrombophilia: Secondary | ICD-10-CM | POA: Insufficient documentation

## 2023-08-29 MED ORDER — METOPROLOL SUCCINATE ER 50 MG PO TB24: 75.0000 mg | ORAL_TABLET | Freq: Every day | ORAL | 2 refills | Status: AC

## 2023-08-29 NOTE — Patient Instructions (Signed)
Increase metoprolol 75 mg daily

## 2023-08-29 NOTE — Progress Notes (Signed)
 Primary Care Physician: Alvia Bring, DO Primary Cardiologist: None Primary Electrophysiologist: Dr. Inocencio Referring Physician: Dr. Inocencio Carmen Cooper is a 83 y.o. female with a history of aortic atherosclerosis, diabetes, CKD, history of TIA, HTN, history of systolic LV dysfunction now improved, hypothyroidism, and atrial fibrillation who presents for consultation in the [facility]. Seen by PCP on 5/9 for hospital f/u due to hypoglycemia and was noted to be in rate controlled Afib. He checked TSH and it was 121.91. Patient is on Eliquis  for a CHADS2VASC score of 8.  On follow up today, she is in rate controlled Afib. She is here with her daughter today. She feels very tired when in Afib. She was in Afib during outside hospitalization at end of April but daughter states they were more focused on controlling her extremely high glucose levels. She was seen by PCP on 5/9 and noted to be in Afib; subsequent lab draw showed TSH abnormal at 121.91. Daughter and patient did not yet know this lab work result. Daughter acknowledges that prior to hospitalization she has had overall infrequent episodes of Afib.   She is compliant with anticoagulation and has not missed any doses. She has no bleeding concerns.  On follow up 07/17/22, she is in Afib with RVR. She feels tired and fatigued while in Afib. She will complete 3 weeks of anticoagulation on Eliquis  5 mg BID today. No missed doses. She would like DCCV as soon as possible to feel better.    On follow up 08/01/22, she is s/p DCCV on 07/20/22 with successful conversion to NSR. She feels unwell since cardioversion. She feels intermittently rapid beats that are bothersome. She feels tired. She has not checked her HR at home.   On follow up 08/14/23, she is currently in Afib. Patient's son called clinic on 6/29 noting she has had increased burden of Afib. Patient unfortunately has ongoing peripheral focal chorioretinal  inflammation and notes she can barely see out of right eye; no longer is driving. 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 No missed doses of Eliquis  5 mg BID.   On follow up 08/29/23, she is currently in NSR. S/p successful DCCV on 7/8. Patient's son contacted office on 7/10 noting possible ERAF. She can feel an uncomfortable heart beat at times. No missed doses of Eliquis .   Today, she denies symptoms of palpitations, chest pain, shortness of breath, orthopnea, PND, lower extremity edema, dizziness, presyncope, syncope, snoring, daytime somnolence, bleeding, or neurologic sequela. The patient is tolerating medications without difficulties and is otherwise without complaint today.    she has a BMI of Body mass index is 22.8 kg/m.SABRA Filed Weights   08/29/23 1504  Weight: 62.1 kg      Family History  Problem Relation Age of Onset   Heart disease Son    Hypertension Mother    Cancer Mother        unsure of origin   Heart attack Father    Diabetes Neg Hx     Atrial Fibrillation Management history:  Previous antiarrhythmic drugs: None Previous cardioversions: 07/20/22, 08/21/23 Previous ablations: none Anticoagulation history: Eliquis  5 mg BID   Past Medical History:  Diagnosis Date   Atrial fibrillation (HCC)    BCC (basal cell carcinoma of skin)    Diabetes (HCC)    Glaucoma    History of TIA (transient ischemic attack) 08/11/2013   12/2012 - Dr. Seaux  Hypertension    Hypothyroidism 08/11/2013   Memory changes    Microscopic colitis 08/21/2013   2008 [facility] Dr. [name]  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 Nilsa, [facility]    Past Surgical History:  Procedure Laterality Date   BREAST EXCISIONAL BIOPSY Left    BREAST EXCISIONAL BIOPSY Left    CARDIOVERSION N/A 02/12/2019   Procedure: CARDIOVERSION;  Surgeon: Mona Vinie BROCKS, MD;  Location: [facility] ENDOSCOPY;  Service: Cardiovascular;  Laterality: N/A;   CARDIOVERSION N/A 07/20/2022   Procedure: CARDIOVERSION;  Surgeon: Jeffrie Oneil BROCKS, MD;  Location: [facility] INVASIVE CV LAB;  Service: Cardiovascular;  Laterality: N/A;   CARDIOVERSION N/A 08/21/2023   Procedure: CARDIOVERSION;  Surgeon: Shlomo Wilbert SAUNDERS, MD;  Location: [facility] INVASIVE CV LAB;  Service: Cardiovascular;  Laterality: N/A;   MOHS SURGERY  2019   Nose bcc    OTHER SURGICAL HISTORY  04/01/2019   biopsy on nose and lip     Current Outpatient Medications  Medication Sig Dispense Refill   apixaban  (ELIQUIS ) 5 MG TABS tablet TAKE 1 TABLET TWICE A DAY 180 tablet 1   atorvastatin  (LIPITOR) 40 MG tablet Take 1 tablet (40 mg total) by mouth daily. 90 tablet 3   atropine  1 % ophthalmic solution Place 1 drop into the right eye 2 (two) times daily.     Calcium  Carbonate (CALCIUM  600 PO) Take 600 mg by mouth daily.     Carboxymethylcell-Glycerin PF (REFRESH RELIEVA PF) 0.5-1 % SOLN Place 1 drop into both eyes daily as needed (Dry eyes).     COSOPT  PF 2-0.5 % SOLN ophthalmic solution Place 1 drop into both eyes in the morning and at bedtime.     diltiazem  (CARDIZEM  CD) 240 MG 24 hr capsule Take 1 capsule (240 mg total) by mouth daily. 90 capsule 3   folic acid  (FOLVITE ) 400 MCG tablet Take 400 mcg by mouth daily.     FREESTYLE LITE test strip      Insulin  Human (INSULIN  PUMP) SOLN Inject into the skin as directed. insulin  lispro (HUMALOG ) 100 UNIT/ML     insulin  lispro (HUMALOG ) 100 UNIT/ML injection Medtronic 630G pump.  Basal 12-6a 0.625, 6a-7p 0.725, 7p-12a 0.625.  Preset bolus:  4/5/6.  ISF 50.  Total daily dose:  40 units/day     Lifitegrast (XIIDRA) 5 % SOLN Place 1 drop into both eyes daily.      lisinopril  (ZESTRIL ) 20 MG tablet TAKE ONE-HALF (1/2) TABLET DAILY 45 tablet 3   metoprolol  succinate (TOPROL -XL) 50 MG 24 hr tablet Take 50 mg by mouth daily.     RABEprazole (ACIPHEX) 20 MG tablet Take 20 mg by mouth  every morning.     SURE COMFORT PEN NEEDLES 31G X 8 MM MISC      TIROSINT  88 MCG CAPS Take 352 mcg by mouth See admin instructions. Take 352 mg on Wednesday and Saturday on empty stomach     valACYclovir (VALTREX) 1000 MG tablet Take 1,000 mg by mouth daily.     ZIOPTAN 0.0015 % SOLN Place 1 drop into the left eye at bedtime.     No current facility-administered medications for this encounter.    ROS- All systems are reviewed and negative except as per the HPI above.  Physical Exam: Vitals:   08/29/23 1504  BP: (!) 150/80  Pulse: 65  Weight: 62.1 kg  Height: 5' 5 (1.651  m)    GEN- The patient is well appearing, alert and oriented x 3 today.   Neck - no JVD or carotid bruit noted Lungs- Clear to ausculation bilaterally, normal work of breathing Heart- Regular rate and rhythm, no murmurs, rubs or gallops, PMI not laterally displaced Extremities- no clubbing, cyanosis, or edema Skin - no rash or ecchymosis noted   Wt Readings from Last 3 Encounters:  08/29/23 62.1 kg  08/14/23 61.6 kg  05/21/23 63.5 kg    EKG today demonstrates  Vent. rate 65 BPM PR interval 162 ms QRS duration 72 ms QT/QTcB 418/434 ms P-R-T axes 73 74 83 Sinus rhythm with occasional Premature ventricular complexes Otherwise normal ECG When compared with ECG of 29-Aug-2023 15:18, Premature ventricular complexes are now Present Confirmed by Terra Pac [phone]) on 08/29/2023 3:39:12 PM  Echo 04/20/22 demonstrated: 1. Left ventricular ejection fraction, by estimation, is 60 to 65%. The  left ventricle has normal function. The left ventricle has no regional  wall motion abnormalities. There is mild left ventricular hypertrophy.  Left ventricular diastolic parameters  are consistent with Grade II diastolic dysfunction (pseudonormalization).   2. Right ventricular systolic function is normal. The right ventricular  size is normal.   3. Left atrial size was severely dilated.   4. Right atrial size was mild  to moderately dilated.   5. The mitral valve is normal in structure. Mild mitral valve  regurgitation. No evidence of mitral stenosis.   6. The aortic valve is normal in structure. Aortic valve regurgitation is  not visualized. No aortic stenosis is present.   7. There is mild dilatation Upper abdominal, measuring 39 mm.   8. The inferior vena cava is normal in size with greater than 50%  respiratory variability, suggesting right atrial pressure of 3 mmHg.   Comparison(s): EF 40%, mild LVH, RA & LA severely dialted, moderate TR,  mild-mod MR RVSP 38.9 mmHg.   Epic records are reviewed at length today.  CHA2DS2-VASc Score = 8  The patient's score is based upon: CHF History: 0 HTN History: 1 Diabetes History: 1 Stroke History: 2 Vascular Disease History: 1 Age Score: 2 Gender Score: 1       ASSESSMENT AND PLAN: Persistent Atrial Fibrillation (ICD10:  I48.19) The patient's CHA2DS2-VASc score is 8, indicating a 10.8% annual risk of stroke.   S/p DCCV on 08/21/23.  She is currently in NSR. Will increase metoprolol  to 75 mg daily to help suppress PVCs.   We discussed previously Multaq, Tikosyn 125 mcg BID, amiodarone , and ablation going forward if increased Afib burden.  PR 178 ms Qtc in NSR 440 ms CrCl 33 mL/min from [facility] on 08/14/23.  2. Secondary Hypercoagulable State (ICD10:  D68.69) The patient is at significant risk for stroke/thromboembolism based upon her CHA2DS2-VASc Score of 8.  Continue Apixaban  (Eliquis ).  No missed doses.   3. HTN Elevated today, Toprol  being increased.    Follow up 6 months Afib clinic.   Pac Terra, PA-C Afib Clinic [facility] [address] [place], [place] [address] [phone] 08/29/2023 3:30 PM

## 2023-09-12 ENCOUNTER — Ambulatory Visit (HOSPITAL_COMMUNITY): Admitting: Internal Medicine

## 2023-09-17 ENCOUNTER — Other Ambulatory Visit: Payer: Self-pay | Admitting: Cardiology

## 2023-09-24 ENCOUNTER — Ambulatory Visit (INDEPENDENT_AMBULATORY_CARE_PROVIDER_SITE_OTHER): Admitting: Family Medicine

## 2023-09-24 ENCOUNTER — Telehealth: Payer: Self-pay | Admitting: Family Medicine

## 2023-09-24 ENCOUNTER — Encounter: Payer: Self-pay | Admitting: Cardiology

## 2023-09-24 ENCOUNTER — Encounter: Payer: Self-pay | Admitting: Family Medicine

## 2023-09-24 ENCOUNTER — Ambulatory Visit

## 2023-09-24 ENCOUNTER — Ambulatory Visit: Payer: Self-pay

## 2023-09-24 VITALS — BP 150/82 | HR 90 | Temp 99.1°F | Ht 65.0 in | Wt 134.0 lb

## 2023-09-24 DIAGNOSIS — R051 Acute cough: Secondary | ICD-10-CM

## 2023-09-24 DIAGNOSIS — R002 Palpitations: Secondary | ICD-10-CM

## 2023-09-24 DIAGNOSIS — R059 Cough, unspecified: Secondary | ICD-10-CM | POA: Diagnosis not present

## 2023-09-24 DIAGNOSIS — J029 Acute pharyngitis, unspecified: Secondary | ICD-10-CM | POA: Diagnosis not present

## 2023-09-24 DIAGNOSIS — J22 Unspecified acute lower respiratory infection: Secondary | ICD-10-CM

## 2023-09-24 DIAGNOSIS — R509 Fever, unspecified: Secondary | ICD-10-CM | POA: Diagnosis not present

## 2023-09-24 LAB — POC SOFIA 2 FLU + SARS ANTIGEN FIA
Influenza A, POC: NEGATIVE
Influenza B, POC: NEGATIVE

## 2023-09-24 MED ORDER — DILTIAZEM HCL ER COATED BEADS 240 MG PO CP24
240.0000 mg | ORAL_CAPSULE | Freq: Every day | ORAL | 3 refills | Status: AC
Start: 2023-09-24 — End: ?

## 2023-09-24 MED ORDER — AMOXICILLIN-POT CLAVULANATE 875-125 MG PO TABS
1.0000 | ORAL_TABLET | Freq: Two times a day (BID) | ORAL | 0 refills | Status: AC
Start: 2023-09-24 — End: ?

## 2023-09-24 NOTE — Telephone Encounter (Signed)
 FYI Only or Action Required?: FYI only for provider.  Patient was last seen in primary care on 02/09/2023 by Alvia Bring, DO.  Called Nurse Triage reporting Heart Problem.  Symptoms began a week ago.  Interventions attempted: Nothing.  Symptoms are: unchanged.  Triage Disposition: Call PCP Within 24 Hours  Patient/caregiver understands and will follow disposition?: Yes            Copied from CRM #[id number]. Topic: Clinical - Red Word Triage >> Sep 24, 2023  8:05 AM Alfonso ORN wrote: Red Word that prompted transfer to Nurse Triage: in and out of Afib , cough , weak, bad sore throat   Call back sadira standard (son) [phone] ----------------------------------------------------------------------- From previous Reason for Contact - Scheduling: Patient/patient representative is calling to schedule an appointment. Refer to attachments for appointment information. Reason for Disposition  Heart rhythm alert (e.g., you have irregular heartbeat) from personal wearable device (e.g., Apple Watch)  Answer Assessment - Initial Assessment Questions 1. DESCRIPTION: Please describe your heart rate or heartbeat that you are having (e.g., fast/slow, regular/irregular, skipped or extra beats, palpitations)     Son states in and out of afib and a sore throat, weakness, states heart doesn't feel right 2. ONSET: When did it start? (e.g., minutes, hours, days)      Last week 3. DURATION: How long does it last (e.g., seconds, minutes, hours)     constant 4. PATTERN Does it come and go, or has it been constant since it started?  Does it get worse with exertion?   Are you feeling it now?     constant 5. TAP: Using your hand, can you tap out what you are feeling on a chair or table in front of you, so that I can hear? Note: Not all patients can do this.       Na son on the phone 6. HEART RATE: Can you tell me your heart rate? How many beats in 15 seconds?  Note: Not all  patients can do this.       Na, son on the phone 7. RECURRENT SYMPTOM: Have you ever had this before? If Yes, ask: When was the last time? and What happened that time?      Yes, hx afib 8. CAUSE: What do you think is causing the palpitations?     na 9. CARDIAC HISTORY: Do you have any history of heart disease? (e.g., heart attack, angina, bypass surgery, angioplasty, arrhythmia)      denies 10. OTHER SYMPTOMS: Do you have any other symptoms? (e.g., dizziness, chest pain, sweating, difficulty breathing)       denies 11. PREGNANCY: Is there any chance you are pregnant? When was your last menstrual period?       na  Protocols used: Heart Rate and Heartbeat Questions-A-AH

## 2023-09-24 NOTE — Progress Notes (Signed)
 Acute Office Visit  Subjective:     Patient ID: Carmen Cooper, female    DOB: 04-08-1940, 83 y.o.   MRN: [id number]  Chief Complaint  Patient presents with   Atrial Fibrillation   Sore Throat    HPI Patient is in today for cough and ST for about 6-7 days.  Non productive hacking cough.  Her family member who is here with her today says that it is actually been getting gradually worse.  Also her sore throat this morning was worse.  She has not had any ear pain but has had a decreased appetite no diarrhea.  She did have a cardioversion done about 2 months ago for A-fib and has just felt like maybe she had gone back into A-fib feeling some palpitations.  So they are not sure if it is just PVCs or recurrence of the A-fib.  History of diabetes, followed by endocrinology.  Blood sugars have actually been well-controlled while she has been sick.  ROS      Objective:    BP (!) 150/82   Pulse 90   Temp 99.1 F (37.3 C) (Oral)   Ht 5' 5 (1.651 m)   Wt 134 lb (60.8 kg)   SpO2 95%   BMI 22.30 kg/m    Physical Exam Constitutional:      Appearance: Normal appearance.  HENT:     Head: Normocephalic and atraumatic.     Right Ear: Tympanic membrane, ear canal and external ear normal. There is no impacted cerumen.     Left Ear: Tympanic membrane, ear canal and external ear normal. There is no impacted cerumen.     Nose: Nose normal.     Mouth/Throat:     Pharynx: Oropharynx is clear.  Eyes:     Conjunctiva/sclera: Conjunctivae normal.  Cardiovascular:     Rate and Rhythm: Normal rate and regular rhythm.  Pulmonary:     Effort: Pulmonary effort is normal.     Breath sounds: Normal breath sounds.  Musculoskeletal:     Cervical back: Neck supple. No tenderness.  Lymphadenopathy:     Cervical: No cervical adenopathy.  Skin:    General: Skin is warm and dry.  Neurological:     Mental Status: She is alert and oriented to person, place, and time.  Psychiatric:        Mood and  Affect: Mood normal.     Results for orders placed or performed in visit on 09/24/23  POC SOFIA 2 FLU + SARS ANTIGEN FIA  Result Value Ref Range   Influenza A, POC Negative Negative   Influenza B, POC Negative Negative   SARS Coronavirus 2 Ag          Assessment & Plan:   Problem List Items Addressed This Visit   None Visit Diagnoses       Acute cough    -  Primary   Relevant Orders   POC SOFIA 2 FLU + SARS ANTIGEN FIA (Completed)     Fever, unspecified fever cause       Relevant Orders   POC SOFIA 2 FLU + SARS ANTIGEN FIA (Completed)     Sore throat         Palpitations       Relevant Orders   EKG 12-Lead     Lower respiratory infection       Relevant Medications   amoxicillin -clavulanate (AUGMENTIN ) 875-125 MG tablet   Other Relevant Orders   DG Chest 2 View  Lower respiratory tract infection-I am concerned that she has had symptoms for a week at this point and now she is running a low-grade temperature and feels like her cough is actually worse.  And physically she feels worse.  She spent most of yesterday sleeping and which is not like her.  Did encourage her to hydrate well and go ahead and start her on Augmentin .  She was negative for flu and COVID.  Will get chest x-ray.  Palpitations-EKG shows that she is not in A-fib.  So still doing well after her cardioversion she did have some PACs.\  EKG today shows rate of 90 bpm, sinus rhythm with premature supraventricular complexes.  Meds ordered this encounter  Medications   amoxicillin -clavulanate (AUGMENTIN ) 875-125 MG tablet    Sig: Take 1 tablet by mouth 2 (two) times daily.    Dispense:  14 tablet    Refill:  0    Return if symptoms worsen or fail to improve.  Dorothyann Byars, MD

## 2023-09-24 NOTE — Telephone Encounter (Signed)
 Pt scheduled for 09/24/23 with Dr. Alvan

## 2023-09-24 NOTE — Telephone Encounter (Signed)
 Completed abstract of labs from [facility]

## 2023-09-24 NOTE — Telephone Encounter (Signed)
 Please abstract A1c and any other additional information from [facility].  She sees endocrinology there.

## 2023-09-27 ENCOUNTER — Ambulatory Visit: Payer: Self-pay | Admitting: Family Medicine

## 2023-09-27 NOTE — Progress Notes (Signed)
 HI Carmen Cooper, Carmen Cooper news!  No sign of pneumonia.  Hopefully you are starting to feel better.

## 2023-10-15 ENCOUNTER — Other Ambulatory Visit: Payer: Self-pay | Admitting: Cardiology

## 2023-10-16 ENCOUNTER — Encounter: Payer: Self-pay | Admitting: Sports Medicine

## 2023-10-22 DIAGNOSIS — H3581 Retinal edema: Secondary | ICD-10-CM | POA: Diagnosis not present

## 2023-10-22 DIAGNOSIS — H353212 Exudative age-related macular degeneration, right eye, with inactive choroidal neovascularization: Secondary | ICD-10-CM | POA: Diagnosis not present

## 2023-10-22 DIAGNOSIS — H353221 Exudative age-related macular degeneration, left eye, with active choroidal neovascularization: Secondary | ICD-10-CM | POA: Diagnosis not present

## 2023-10-22 DIAGNOSIS — H401120 Primary open-angle glaucoma, left eye, stage unspecified: Secondary | ICD-10-CM | POA: Diagnosis not present

## 2023-10-22 DIAGNOSIS — H30031 Focal chorioretinal inflammation, peripheral, right eye: Secondary | ICD-10-CM | POA: Diagnosis not present

## 2023-10-22 DIAGNOSIS — H44111 Panuveitis, right eye: Secondary | ICD-10-CM | POA: Diagnosis not present

## 2023-10-22 DIAGNOSIS — Z961 Presence of intraocular lens: Secondary | ICD-10-CM | POA: Diagnosis not present

## 2023-10-22 DIAGNOSIS — H209 Unspecified iridocyclitis: Secondary | ICD-10-CM | POA: Diagnosis not present

## 2023-10-22 DIAGNOSIS — Z79899 Other long term (current) drug therapy: Secondary | ICD-10-CM | POA: Diagnosis not present

## 2023-10-22 DIAGNOSIS — H4041X1 Glaucoma secondary to eye inflammation, right eye, mild stage: Secondary | ICD-10-CM | POA: Diagnosis not present

## 2023-10-29 ENCOUNTER — Other Ambulatory Visit: Payer: Self-pay | Admitting: Family Medicine

## 2023-10-29 DIAGNOSIS — I700 Atherosclerosis of aorta: Secondary | ICD-10-CM

## 2023-11-20 DIAGNOSIS — Z23 Encounter for immunization: Secondary | ICD-10-CM | POA: Diagnosis not present

## 2023-11-20 DIAGNOSIS — C159 Malignant neoplasm of esophagus, unspecified: Secondary | ICD-10-CM | POA: Diagnosis not present

## 2023-12-03 DIAGNOSIS — H209 Unspecified iridocyclitis: Secondary | ICD-10-CM | POA: Diagnosis not present

## 2023-12-03 DIAGNOSIS — H4043X3 Glaucoma secondary to eye inflammation, bilateral, severe stage: Secondary | ICD-10-CM | POA: Diagnosis not present

## 2023-12-10 DIAGNOSIS — C154 Malignant neoplasm of middle third of esophagus: Secondary | ICD-10-CM | POA: Diagnosis not present

## 2023-12-17 DIAGNOSIS — E1049 Type 1 diabetes mellitus with other diabetic neurological complication: Secondary | ICD-10-CM | POA: Diagnosis not present

## 2023-12-17 DIAGNOSIS — I129 Hypertensive chronic kidney disease with stage 1 through stage 4 chronic kidney disease, or unspecified chronic kidney disease: Secondary | ICD-10-CM | POA: Diagnosis not present

## 2023-12-17 DIAGNOSIS — E039 Hypothyroidism, unspecified: Secondary | ICD-10-CM | POA: Diagnosis not present

## 2023-12-17 DIAGNOSIS — N183 Chronic kidney disease, stage 3 unspecified: Secondary | ICD-10-CM | POA: Diagnosis not present

## 2023-12-17 DIAGNOSIS — E785 Hyperlipidemia, unspecified: Secondary | ICD-10-CM | POA: Diagnosis not present

## 2023-12-17 DIAGNOSIS — E1069 Type 1 diabetes mellitus with other specified complication: Secondary | ICD-10-CM | POA: Diagnosis not present

## 2023-12-17 DIAGNOSIS — E1022 Type 1 diabetes mellitus with diabetic chronic kidney disease: Secondary | ICD-10-CM | POA: Diagnosis not present

## 2024-01-09 ENCOUNTER — Ambulatory Visit: Payer: Self-pay

## 2024-01-09 NOTE — Telephone Encounter (Signed)
 FYI Only or Action Required?: Action required by provider: request for appointment, clinical question for provider, and update on patient condition.  Patient was last seen in primary care on 09/24/2023 by Alvan Dorothyann BIRCH, MD.  Called Nurse Triage reporting Memory Loss.  Symptoms began 2 weeks ago.  Interventions attempted: Nothing.  Symptoms are: gradually worsening.  Triage Disposition: See HCP Within 4 Hours (Or PCP Triage)  Patient/caregiver understands and will follow disposition?: No, wishes to speak with PCP                 Copied from CRM #[id number]. Topic: Clinical - Red Word Triage >> Jan 09, 2024  2:59 PM Gustabo D wrote: Pt son says his mom has been a bit more confused than normal about 2 weeks.  Says it's getting worse Reason for Disposition  [1] Acting confused (e.g., disoriented, slurred speech) AND [2] brief (now gone)  Answer Assessment - Initial Assessment Questions Blood Sugars have been up and down he states but yesterday the numbers were in a range from 100-200 Patient appears well per son--walking, talking, cleaning. Son states short term memory hasn't been great but feels like it may be getting worse Denies falls, hitting head, changes in medications Son states that he just wanted her checked out for this--she had asked him something about credit cards that he states he had to explain to her.  He states that she has no other major noted symptoms that he is aware of but he wanted to get her checked out and rule out anything such as a urinary tract infection. He denies any known urinary symptoms such as frequency, odor in urine, or dark urine. He is advised that it is recommended that she be seen today either Urgent Care or the Emergency Room and there are no available appointments today at her PCP office or the surrounding offices within the region. Son states that he didn't want to make an appointment today for her, he was looking to get an  appointment for her next week and he did not think that she needed to go to Urgent Care or the Emergency Room today.  Patient is advised to call us  back if anything changes or with any further questions/concerns. Patient is advised that if anything worsens to go to the Emergency Room. Patient verbalized understanding.  Protocols used: Confusion - Delirium-A-AH

## 2024-01-09 NOTE — Telephone Encounter (Signed)
 Called [facility] to advise them of patient's symptoms and the son wanting an appointment next week instead of taking her to Urgent Care or the ER today.

## 2024-01-14 NOTE — Telephone Encounter (Signed)
 No available appointments at [facility] or [facility] today. Please advise, thanks.

## 2024-01-16 NOTE — Telephone Encounter (Signed)
 Task completed. The patient has been scheduled with the provider on 01/17/24 at 310 pm.

## 2024-01-16 NOTE — Telephone Encounter (Signed)
 01/16/2024 Left message on patients son ( Darryl) voicemail to contact the office to schedule an appointment with Dr. Alvia.

## 2024-01-17 ENCOUNTER — Encounter: Payer: Self-pay | Admitting: Family Medicine

## 2024-01-17 ENCOUNTER — Ambulatory Visit: Admitting: Family Medicine

## 2024-01-17 VITALS — BP 139/80 | HR 55 | Temp 97.8°F | Ht 65.0 in | Wt 142.0 lb

## 2024-01-17 DIAGNOSIS — R8290 Unspecified abnormal findings in urine: Secondary | ICD-10-CM | POA: Diagnosis not present

## 2024-01-17 DIAGNOSIS — N3000 Acute cystitis without hematuria: Secondary | ICD-10-CM | POA: Insufficient documentation

## 2024-01-17 LAB — POCT URINALYSIS DIP (CLINITEK)
Bilirubin, UA: NEGATIVE
Blood, UA: NEGATIVE
Glucose, UA: NEGATIVE mg/dL
Ketones, POC UA: NEGATIVE mg/dL
Nitrite, UA: NEGATIVE
POC PROTEIN,UA: 100 — AB
Spec Grav, UA: 1.02 (ref 1.010–1.025)
Urobilinogen, UA: 0.2 U/dL
pH, UA: 5.5 (ref 5.0–8.0)

## 2024-01-17 MED ORDER — CEPHALEXIN 500 MG PO CAPS: 500.0000 mg | ORAL_CAPSULE | Freq: Three times a day (TID) | ORAL | 0 refills | Status: AC

## 2024-01-17 NOTE — Patient Instructions (Signed)

## 2024-01-17 NOTE — Assessment & Plan Note (Signed)
 She does have some leukocytes in her urine as well as increased urinary frequency and urgency.  Will send urine for culture.  Going to cover with Keflex .  Red flags reviewed.

## 2024-01-17 NOTE — Progress Notes (Signed)
 Carmen Cooper - 83 y.o. female MRN [id number]  Date of birth: Mar 12, 1940  Subjective Chief Complaint  Patient presents with   Memory Loss    HPI Carmen Cooper is a 83 y.o. female here today accompanied by her son due to increased confusion.  She has been more forgetful than her usual baseline over the past couple of weeks.  Concerns about possible UTI.  She has had some increased urinary frequency.  Denies dysuria.  Denies fever or chills.    ROS:  A comprehensive ROS was completed and negative except as noted per HPI  No Known Allergies  Past Medical History:  Diagnosis Date   Atrial fibrillation (HCC)    BCC (basal cell carcinoma of skin)    Diabetes (HCC)    Glaucoma    History of TIA (transient ischemic attack) 08/11/2013   12/2012 - Dr. Maryanne    Hypertension    Hypothyroidism 08/11/2013   Memory changes    Microscopic colitis 08/21/2013   2008 - [facility] Dr. [name]  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 Nilsa, [facility]     Past Surgical History:  Procedure Laterality Date   BREAST EXCISIONAL BIOPSY Left    BREAST EXCISIONAL BIOPSY Left    CARDIOVERSION N/A 02/12/2019   Procedure: CARDIOVERSION;  Surgeon: Mona Vinie BROCKS, MD;  Location: [facility] ENDOSCOPY;  Service: Cardiovascular;  Laterality: N/A;   CARDIOVERSION N/A 07/20/2022   Procedure: CARDIOVERSION;  Surgeon: Jeffrie Oneil BROCKS, MD;  Location: [facility] INVASIVE CV LAB;  Service: Cardiovascular;  Laterality: N/A;   CARDIOVERSION N/A 08/21/2023   Procedure: CARDIOVERSION;  Surgeon: Shlomo Wilbert SAUNDERS, MD;  Location: [facility] INVASIVE CV LAB;  Service: Cardiovascular;  Laterality: N/A;   MOHS SURGERY  2019   Nose bcc    OTHER SURGICAL HISTORY  04/01/2019   biopsy on nose and lip     Social History   Socioeconomic History   Marital status: Widowed    Spouse name: [name]   Number of children: 1   Years of education: 12   Highest education level: 12th  grade  Occupational History   Occupation: Retired    Comment: retired  Tobacco Use   Smoking status: Former    Current packs/day: 0.00    Average packs/day: 0.3 packs/day for 64.1 years (16.0 ttl pk-yrs)    Types: Cigarettes    Start date: 4    Quit date: 03/20/2018    Years since quitting: 5.8   Smokeless tobacco: Never  Vaping Use   Vaping status: Never Used  Substance and Sexual Activity   Alcohol use: Not Currently   Drug use: No   Sexual activity: Not Currently    Partners: Male  Other Topics Concern   Not on file  Social History Narrative   Lives alone. She has one son, who takes her to her doctor's appointments. She enjoys crochet and gardening.   Social Drivers of Corporate Investment Banker Strain: Low Risk  (05/10/2023)   Received from [facility]   Overall Financial Resource Strain (CARDIA)    Difficulty of Paying Living Expenses: Not very hard  Food Insecurity: No Food Insecurity (05/10/2023)   Received from [facility]   Hunger Vital Sign    Within the past 12 months, you worried that your food would run out before you got the money to buy more.: Never true    Within the past  12 months, the food you bought just didn't last and you didn't have money to get more.: Never true  Transportation Needs: No Transportation Needs (05/10/2023)   PRAPARE - Administrator, Civil Service (Medical): No    Lack of Transportation (Non-Medical): No  Physical Activity: Sufficiently Active (03/21/2023)   Exercise Vital Sign    Days of Exercise per Week: 4 days    Minutes of Exercise per Session: 40 min  Stress: No Stress Concern Present (03/21/2023)   [employer] of Occupational Health - Occupational Stress Questionnaire    Feeling of Stress : Not at all  Social Connections: Socially Isolated (03/21/2023)   Social Connection and Isolation Panel    Frequency of Communication with Friends and Family: More than three times a week    Frequency of Social Gatherings  with Friends and Family: More than three times a week    Attends Religious Services: Never    Database Administrator or Organizations: No    Attends Banker Meetings: Never    Marital Status: Widowed    Family History  Problem Relation Age of Onset   Heart disease Son    Hypertension Mother    Cancer Mother        unsure of origin   Heart attack Father    Diabetes Neg Hx     Health Maintenance  Topic Date Due   Zoster Vaccines- Shingrix (2 of 2) 05/21/2021   FOOT EXAM  03/09/2022   COVID-19 Vaccine (8 - 2025-26 season) 10/15/2023   HEMOGLOBIN A1C  02/21/2024   Medicare Annual Wellness (AWV)  03/20/2024   OPHTHALMOLOGY EXAM  06/12/2024   Diabetic kidney evaluation - eGFR measurement  08/20/2024   Diabetic kidney evaluation - Urine ACR  08/20/2024   DTaP/Tdap/Td (3 - Td or Tdap) 06/26/2031   Pneumococcal Vaccine: 50+ Years  Completed   Influenza Vaccine  Completed   Bone Density Scan  Completed   Meningococcal B Vaccine  Aged Out   Mammogram  Discontinued     ----------------------------------------------------------------------------------------------------------------------------------------------------------------------------------------------------------------- Physical Exam BP 139/80   Pulse (!) 55   Temp 97.8 F (36.6 C)   Ht 5' 5 (1.651 m)   Wt 142 lb (64.4 kg)   SpO2 95%   BMI 23.63 kg/m   Physical Exam Constitutional:      Appearance: Normal appearance.  Eyes:     General: No scleral icterus. Musculoskeletal:     Cervical back: Neck supple.  Neurological:     Mental Status: She is alert.  Psychiatric:        Mood and Affect: Mood normal.        Behavior: Behavior normal.     ------------------------------------------------------------------------------------------------------------------------------------------------------------------------------------------------------------------- Assessment and Plan  Acute cystitis She does  have some leukocytes in her urine as well as increased urinary frequency and urgency.  Will send urine for culture.  Going to cover with Keflex .  Red flags reviewed.   Meds ordered this encounter  Medications   cephALEXin  (KEFLEX ) 500 MG capsule    Sig: Take 1 capsule (500 mg total) by mouth 3 (three) times daily for 5 days.    Dispense:  15 capsule    Refill:  0    No follow-ups on file.

## 2024-01-19 LAB — URINE CULTURE

## 2024-01-22 DIAGNOSIS — C159 Malignant neoplasm of esophagus, unspecified: Secondary | ICD-10-CM | POA: Diagnosis not present

## 2024-01-22 DIAGNOSIS — K2289 Other specified disease of esophagus: Secondary | ICD-10-CM | POA: Diagnosis not present

## 2024-02-12 ENCOUNTER — Other Ambulatory Visit: Payer: Self-pay

## 2024-02-12 DIAGNOSIS — I4891 Unspecified atrial fibrillation: Secondary | ICD-10-CM

## 2024-02-12 DIAGNOSIS — R0789 Other chest pain: Secondary | ICD-10-CM

## 2024-02-12 MED ORDER — APIXABAN 5 MG PO TABS: 5.0000 mg | ORAL_TABLET | Freq: Two times a day (BID) | ORAL | 1 refills | Status: AC

## 2024-02-12 NOTE — Telephone Encounter (Signed)
 Prescription refill request for Eliquis  received. Indication:afib Last office visit:7/25 Scr: 1.1  7/25 Age:83 Weight:64.4  kg  Prescription refilled

## 2024-03-03 ENCOUNTER — Encounter (HOSPITAL_COMMUNITY): Payer: Self-pay | Admitting: Internal Medicine

## 2024-03-03 ENCOUNTER — Ambulatory Visit (HOSPITAL_COMMUNITY)
Admission: RE | Admit: 2024-03-03 | Discharge: 2024-03-03 | Disposition: A | Source: Ambulatory Visit | Attending: Internal Medicine | Admitting: Internal Medicine

## 2024-03-03 VITALS — BP 146/96 | HR 57 | Ht 65.0 in | Wt 146.8 lb

## 2024-03-03 DIAGNOSIS — I4819 Other persistent atrial fibrillation: Secondary | ICD-10-CM | POA: Diagnosis not present

## 2024-03-03 DIAGNOSIS — D6869 Other thrombophilia: Secondary | ICD-10-CM | POA: Diagnosis not present

## 2024-03-03 NOTE — Progress Notes (Signed)
 "   Primary Care Physician: Alvia Bring, DO Primary Cardiologist: None Primary Electrophysiologist: Dr. Inocencio Referring Physician: Dr. Inocencio Carmen Cooper is a 84 y.o. female with a history of aortic atherosclerosis, diabetes, CKD, history of TIA, HTN, history of systolic LV dysfunction now improved, hypothyroidism, and atrial fibrillation who presents for consultation in the [facility]. Seen by PCP on 5/9 for hospital f/u due to hypoglycemia and was noted to be in rate controlled Afib. He checked TSH and it was 121.91. Patient is on Eliquis  for a CHADS2VASC score of 8.  On follow up today, she is in rate controlled Afib. She is here with her daughter today. She feels very tired when in Afib. She was in Afib during outside hospitalization at end of April but daughter states they were more focused on controlling her extremely high glucose levels. She was seen by PCP on 5/9 and noted to be in Afib; subsequent lab draw showed TSH abnormal at 121.91. Daughter and patient did not yet know this lab work result. Daughter acknowledges that prior to hospitalization she has had overall infrequent episodes of Afib.   She is compliant with anticoagulation and has not missed any doses. She has no bleeding concerns.  On follow up 07/17/22, she is in Afib with RVR. She feels tired and fatigued while in Afib. She will complete 3 weeks of anticoagulation on Eliquis  5 mg BID today. No missed doses. She would like DCCV as soon as possible to feel better.    On follow up 08/01/22, she is s/p DCCV on 07/20/22 with successful conversion to NSR. She feels unwell since cardioversion. She feels intermittently rapid beats that are bothersome. She feels tired. She has not checked her HR at home.   On follow up 08/14/23, she is currently in Afib. Patient's son called clinic on 6/29 noting she has had increased burden of Afib. Patient unfortunately has ongoing peripheral focal chorioretinal  inflammation and notes she can barely see out of right eye; no longer is driving. 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 No missed doses of Eliquis  5 mg BID.   On follow up 08/29/23, she is currently in NSR. S/p successful DCCV on 7/8. Patient's son contacted office on 7/10 noting possible ERAF. She can feel an uncomfortable heart beat at times. No missed doses of Eliquis .   Follow up 03/03/24. Patient is currently in NSR. She is taking diltiazem  240 mg daily and Toprol  75 mg daily. She still feels occasional heart fluttering; for example she thought she was in Afib immediately following ECG been performed. No bleeding issues on Eliquis .   Today, she denies symptoms of chest pain, shortness of breath, orthopnea, PND, lower extremity edema, dizziness, presyncope, syncope, snoring, daytime somnolence, bleeding, or neurologic sequela. The patient is tolerating medications without difficulties and is otherwise without complaint today.    she has a BMI of Body mass index is 24.43 kg/m.SABRA Filed Weights   03/03/24 1358  Weight: 66.6 kg     Family History  Problem Relation Age of Onset   Heart disease Son    Hypertension Mother    Cancer Mother        unsure of origin   Heart attack Father    Diabetes Neg Hx     Atrial Fibrillation Management history:  Previous antiarrhythmic drugs: None Previous cardioversions: 07/20/22, 08/21/23 Previous ablations: none Anticoagulation history: Eliquis  5 mg BID   Past  Medical History:  Diagnosis Date   Atrial fibrillation (HCC)    BCC (basal cell carcinoma of skin)    Diabetes (HCC)    Glaucoma    History of TIA (transient ischemic attack) 08/11/2013   12/2012 - Dr. Maryanne    Hypertension    Hypothyroidism 08/11/2013   Memory changes    Microscopic colitis 08/21/2013   2008 [facility] Dr. [name]  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 Nilsa, [facility]    Past Surgical History:  Procedure Laterality Date   BREAST EXCISIONAL BIOPSY Left    BREAST EXCISIONAL BIOPSY Left    CARDIOVERSION N/A 02/12/2019   Procedure: CARDIOVERSION;  Surgeon: Mona Vinie BROCKS, MD;  Location: [facility] ENDOSCOPY;  Service: Cardiovascular;  Laterality: N/A;   CARDIOVERSION N/A 07/20/2022   Procedure: CARDIOVERSION;  Surgeon: Jeffrie Oneil BROCKS, MD;  Location: [facility] INVASIVE CV LAB;  Service: Cardiovascular;  Laterality: N/A;   CARDIOVERSION N/A 08/21/2023   Procedure: CARDIOVERSION;  Surgeon: Shlomo Wilbert SAUNDERS, MD;  Location: [facility] INVASIVE CV LAB;  Service: Cardiovascular;  Laterality: N/A;   MOHS SURGERY  2019   Nose bcc    OTHER SURGICAL HISTORY  04/01/2019   biopsy on nose and lip     Current Outpatient Medications  Medication Sig Dispense Refill   amoxicillin -clavulanate (AUGMENTIN ) 875-125 MG tablet Take 1 tablet by mouth 2 (two) times daily. 14 tablet 0   apixaban  (ELIQUIS ) 5 MG TABS tablet Take 1 tablet (5 mg total) by mouth 2 (two) times daily. 180 tablet 1   atorvastatin  (LIPITOR) 40 MG tablet TAKE 1 TABLET DAILY 90 tablet 3   Calcium  Carbonate (CALCIUM  600 PO) Take 600 mg by mouth daily.     Carboxymethylcell-Glycerin PF (REFRESH RELIEVA PF) 0.5-1 % SOLN Place 1 drop into both eyes daily as needed (Dry eyes).     COSOPT  PF 2-0.5 % SOLN ophthalmic solution Place 1 drop into both eyes in the morning and at bedtime.     diltiazem  (CARDIZEM  CD) 240 MG 24 hr capsule Take 1 capsule (240 mg total) by mouth daily. PATIENT MUST CALL AND SCHEDULE APPOINTMENT FOR FURTHER REFILLS FIRST ATTEMPT 90 capsule 3   folic acid  (FOLVITE ) 400 MCG tablet Take 400 mcg by mouth daily.     FREESTYLE LITE test strip      Insulin  Human (INSULIN  PUMP) SOLN Inject into the skin as directed. insulin  lispro (HUMALOG ) 100 UNIT/ML     insulin  lispro (HUMALOG ) 100 UNIT/ML injection Medtronic 630G pump.  Basal 12-6a 0.625, 6a-7p 0.725, 7p-12a 0.625.   Preset bolus:  4/5/6.  ISF 50.  Total daily dose:  40 units/day     Lifitegrast (XIIDRA) 5 % SOLN Place 1 drop into both eyes daily.      lisinopril  (ZESTRIL ) 20 MG tablet TAKE ONE-HALF (1/2) TABLET DAILY 45 tablet 3   metoprolol  succinate (TOPROL -XL) 50 MG 24 hr tablet Take 1.5 tablets (75 mg total) by mouth daily. 135 tablet 2   RABEprazole (ACIPHEX) 20 MG tablet Take 20 mg by mouth every morning.     SURE COMFORT PEN NEEDLES 31G X 8 MM MISC      TIROSINT  88 MCG CAPS Take 352 mcg by mouth See admin instructions. Take 352 mg on Wednesday and Saturday on empty stomach     valACYclovir (VALTREX) 1000 MG tablet Take 1,000 mg by mouth daily.     ZIOPTAN 0.0015 %  SOLN Place 1 drop into the left eye at bedtime.     No current facility-administered medications for this encounter.    ROS- All systems are reviewed and negative except as per the HPI above.  Physical Exam: Vitals:   03/03/24 1358  BP: (!) 146/96  Pulse: (!) 57  Weight: 66.6 kg  Height: 5' 5 (1.651 m)    GEN- The patient is well appearing, alert and oriented x 3 today.   Neck - no JVD or carotid bruit noted Lungs- Clear to ausculation bilaterally, normal work of breathing Heart- Regular rate and rhythm, no murmurs, rubs or gallops, PMI not laterally displaced Extremities- no clubbing, cyanosis, or edema Skin - no rash or ecchymosis noted   Wt Readings from Last 3 Encounters:  03/03/24 66.6 kg  01/17/24 64.4 kg  09/24/23 60.8 kg    EKG today demonstrates  EKG Interpretation Date/Time:  Monday March 03 2024 14:01:40 EST Ventricular Rate:  57 PR Interval:  172 QRS Duration:  82 QT Interval:  428 QTC Calculation: 416 R Axis:   69  Text Interpretation: Sinus bradycardia Otherwise normal ECG When compared with ECG of 29-Aug-2023 15:24, Premature ventricular complexes are no longer Present Confirmed by Terra Pac ([id number]) on 03/03/2024 2:27:08 PM    Echo 04/20/22 demonstrated: 1. Left ventricular ejection fraction,  by estimation, is 60 to 65%. The  left ventricle has normal function. The left ventricle has no regional  wall motion abnormalities. There is mild left ventricular hypertrophy.  Left ventricular diastolic parameters  are consistent with Grade II diastolic dysfunction (pseudonormalization).   2. Right ventricular systolic function is normal. The right ventricular  size is normal.   3. Left atrial size was severely dilated.   4. Right atrial size was mild to moderately dilated.   5. The mitral valve is normal in structure. Mild mitral valve  regurgitation. No evidence of mitral stenosis.   6. The aortic valve is normal in structure. Aortic valve regurgitation is  not visualized. No aortic stenosis is present.   7. There is mild dilatation Upper abdominal, measuring 39 mm.   8. The inferior vena cava is normal in size with greater than 50%  respiratory variability, suggesting right atrial pressure of 3 mmHg.   Comparison(s): EF 40%, mild LVH, RA & LA severely dialted, moderate TR,  mild-mod MR RVSP 38.9 mmHg.   Epic records are reviewed at length today.  CHA2DS2-VASc Score = 8  The patient's score is based upon: CHF History: 0 HTN History: 1 Diabetes History: 1 Stroke History: 2 Vascular Disease History: 1 Age Score: 2 Gender Score: 1       ASSESSMENT AND PLAN: Persistent Atrial Fibrillation (ICD10:  I48.19) The patient's CHA2DS2-VASc score is 8, indicating a 10.8% annual risk of stroke.   S/p DCCV on 08/21/23.  Patient is currently in NSR. Discussion with patient and son that patient could be noting brief SVT episodes as noted in monitor placed in 2024. Reassurance provided. Continue Toprol  75 mg daily. Continue diltiazem  240 mg daily.  We discussed previously Multaq, Tikosyn 125 mcg BID, amiodarone , and ablation going forward if increased Afib burden.  PR 178 ms Qtc in NSR 440 ms CrCl 33 mL/min from [facility] on 08/14/23.  2. Secondary Hypercoagulable State (ICD10:  D68.69) The  patient is at significant risk for stroke/thromboembolism based upon her CHA2DS2-VASc Score of 8.  Continue Apixaban  (Eliquis ).  No missed doses.   3. HTN Stable today.     Follow up 6  months Afib clinic.   Carmen Heinrich, PA-C Afib Clinic [facility] [address] [place], [place] [address] [phone] 03/03/2024 2:27 PM "

## 2024-09-08 ENCOUNTER — Ambulatory Visit (HOSPITAL_COMMUNITY): Admitting: Internal Medicine
